# Patient Record
Sex: Female | Born: 1940 | Race: White | Hispanic: No | Marital: Married | Smoking: Never smoker
Health system: Southern US, Community
[De-identification: ages and names within clinical notes are randomized; demographics above are authoritative.]

## PROBLEM LIST (undated history)

## (undated) DIAGNOSIS — C801 Malignant (primary) neoplasm, unspecified: Secondary | ICD-10-CM

## (undated) DIAGNOSIS — E785 Hyperlipidemia, unspecified: Secondary | ICD-10-CM

## (undated) DIAGNOSIS — I10 Essential (primary) hypertension: Secondary | ICD-10-CM

## (undated) DIAGNOSIS — Z923 Personal history of irradiation: Secondary | ICD-10-CM

## (undated) DIAGNOSIS — M1990 Unspecified osteoarthritis, unspecified site: Secondary | ICD-10-CM

## (undated) DIAGNOSIS — T7840XA Allergy, unspecified, initial encounter: Secondary | ICD-10-CM

## (undated) DIAGNOSIS — C50919 Malignant neoplasm of unspecified site of unspecified female breast: Secondary | ICD-10-CM

## (undated) HISTORY — DX: Allergy, unspecified, initial encounter: T78.40XA

## (undated) HISTORY — PX: BREAST LUMPECTOMY: SHX2

## (undated) HISTORY — DX: Hyperlipidemia, unspecified: E78.5

## (undated) HISTORY — DX: Essential (primary) hypertension: I10

## (undated) HISTORY — PX: MYOMECTOMY: SHX85

## (undated) HISTORY — PX: ELBOW SURGERY: SHX618

## (undated) HISTORY — PX: CATARACT EXTRACTION: SUR2

## (undated) HISTORY — PX: JOINT REPLACEMENT: SHX530

## (undated) HISTORY — DX: Malignant (primary) neoplasm, unspecified: C80.1

## (undated) HISTORY — PX: BREAST DUCTAL SYSTEM EXCISION: SHX5242

## (undated) HISTORY — PX: COLONOSCOPY: SHX174

## (undated) HISTORY — DX: Unspecified osteoarthritis, unspecified site: M19.90

## (undated) HISTORY — PX: KNEE ARTHROPLASTY: SHX992

---

## 2002-04-12 ENCOUNTER — Other Ambulatory Visit: Admission: RE | Admit: 2002-04-12 | Discharge: 2002-04-12 | Payer: Self-pay | Admitting: Obstetrics & Gynecology

## 2002-08-08 ENCOUNTER — Ambulatory Visit (HOSPITAL_COMMUNITY): Admission: RE | Admit: 2002-08-08 | Discharge: 2002-08-08 | Payer: Self-pay | Admitting: Family Medicine

## 2002-08-08 ENCOUNTER — Encounter (INDEPENDENT_AMBULATORY_CARE_PROVIDER_SITE_OTHER): Payer: Self-pay | Admitting: Specialist

## 2003-04-18 ENCOUNTER — Other Ambulatory Visit: Admission: RE | Admit: 2003-04-18 | Discharge: 2003-04-18 | Payer: Self-pay | Admitting: Obstetrics & Gynecology

## 2003-12-30 ENCOUNTER — Encounter: Admission: RE | Admit: 2003-12-30 | Discharge: 2003-12-30 | Payer: Self-pay | Admitting: Internal Medicine

## 2004-01-15 ENCOUNTER — Encounter: Admission: RE | Admit: 2004-01-15 | Discharge: 2004-02-24 | Payer: Self-pay | Admitting: Internal Medicine

## 2004-04-27 ENCOUNTER — Ambulatory Visit: Payer: Self-pay | Admitting: Internal Medicine

## 2004-05-13 ENCOUNTER — Ambulatory Visit: Payer: Self-pay | Admitting: Internal Medicine

## 2004-05-26 ENCOUNTER — Other Ambulatory Visit: Admission: RE | Admit: 2004-05-26 | Discharge: 2004-05-26 | Payer: Self-pay | Admitting: Obstetrics & Gynecology

## 2008-09-11 ENCOUNTER — Encounter: Admission: RE | Admit: 2008-09-11 | Discharge: 2008-09-11 | Payer: Self-pay | Admitting: Internal Medicine

## 2008-10-03 ENCOUNTER — Ambulatory Visit: Payer: Self-pay | Admitting: Internal Medicine

## 2008-11-14 ENCOUNTER — Encounter: Payer: Self-pay | Admitting: Internal Medicine

## 2008-11-14 ENCOUNTER — Ambulatory Visit: Payer: Self-pay | Admitting: Internal Medicine

## 2008-11-17 ENCOUNTER — Encounter: Payer: Self-pay | Admitting: Internal Medicine

## 2009-01-10 DIAGNOSIS — C50919 Malignant neoplasm of unspecified site of unspecified female breast: Secondary | ICD-10-CM

## 2009-01-10 DIAGNOSIS — Z923 Personal history of irradiation: Secondary | ICD-10-CM

## 2009-01-10 HISTORY — DX: Personal history of irradiation: Z92.3

## 2009-01-10 HISTORY — DX: Malignant neoplasm of unspecified site of unspecified female breast: C50.919

## 2009-07-30 ENCOUNTER — Encounter: Admission: RE | Admit: 2009-07-30 | Discharge: 2009-07-30 | Payer: Self-pay | Admitting: Obstetrics & Gynecology

## 2009-07-31 ENCOUNTER — Encounter: Admission: RE | Admit: 2009-07-31 | Discharge: 2009-07-31 | Payer: Self-pay | Admitting: Obstetrics & Gynecology

## 2009-08-05 ENCOUNTER — Encounter: Admission: RE | Admit: 2009-08-05 | Discharge: 2009-08-05 | Payer: Self-pay | Admitting: Obstetrics & Gynecology

## 2009-08-10 HISTORY — PX: BREAST SURGERY: SHX581

## 2009-08-13 ENCOUNTER — Encounter: Admission: RE | Admit: 2009-08-13 | Discharge: 2009-08-13 | Payer: Self-pay | Admitting: General Surgery

## 2009-08-18 ENCOUNTER — Ambulatory Visit (HOSPITAL_BASED_OUTPATIENT_CLINIC_OR_DEPARTMENT_OTHER): Admission: RE | Admit: 2009-08-18 | Discharge: 2009-08-18 | Payer: Self-pay | Admitting: General Surgery

## 2009-09-04 ENCOUNTER — Ambulatory Visit: Payer: Self-pay | Admitting: Oncology

## 2009-09-09 ENCOUNTER — Ambulatory Visit: Admission: RE | Admit: 2009-09-09 | Discharge: 2009-11-19 | Payer: Self-pay | Admitting: Radiation Oncology

## 2009-09-15 ENCOUNTER — Encounter: Admission: RE | Admit: 2009-09-15 | Discharge: 2009-09-15 | Payer: Self-pay | Admitting: General Surgery

## 2009-11-02 ENCOUNTER — Ambulatory Visit: Payer: Self-pay | Admitting: Oncology

## 2009-12-28 ENCOUNTER — Ambulatory Visit: Payer: Self-pay | Admitting: Oncology

## 2009-12-29 LAB — CBC WITH DIFFERENTIAL/PLATELET
BASO%: 0.2 % (ref 0.0–2.0)
Basophils Absolute: 0 10*3/uL (ref 0.0–0.1)
EOS%: 6.1 % (ref 0.0–7.0)
Eosinophils Absolute: 0.4 10*3/uL (ref 0.0–0.5)
HCT: 39.5 % (ref 34.8–46.6)
HGB: 13.4 g/dL (ref 11.6–15.9)
LYMPH%: 22.3 % (ref 14.0–49.7)
MCH: 29 pg (ref 25.1–34.0)
MCHC: 33.9 g/dL (ref 31.5–36.0)
MCV: 85.6 fL (ref 79.5–101.0)
MONO#: 0.6 10*3/uL (ref 0.1–0.9)
MONO%: 8.6 % (ref 0.0–14.0)
NEUT#: 4 10*3/uL (ref 1.5–6.5)
NEUT%: 62.8 % (ref 38.4–76.8)
Platelets: 304 10*3/uL (ref 145–400)
RBC: 4.62 10*6/uL (ref 3.70–5.45)
RDW: 15 % — ABNORMAL HIGH (ref 11.2–14.5)
WBC: 6.4 10*3/uL (ref 3.9–10.3)
lymph#: 1.4 10*3/uL (ref 0.9–3.3)

## 2009-12-30 LAB — COMPREHENSIVE METABOLIC PANEL
ALT: 21 U/L (ref 0–35)
AST: 18 U/L (ref 0–37)
Albumin: 4 g/dL (ref 3.5–5.2)
Alkaline Phosphatase: 80 U/L (ref 39–117)
BUN: 16 mg/dL (ref 6–23)
CO2: 26 mEq/L (ref 19–32)
Calcium: 9.2 mg/dL (ref 8.4–10.5)
Chloride: 102 mEq/L (ref 96–112)
Creatinine, Ser: 0.78 mg/dL (ref 0.40–1.20)
Glucose, Bld: 99 mg/dL (ref 70–99)
Potassium: 3.9 mEq/L (ref 3.5–5.3)
Sodium: 144 mEq/L (ref 135–145)
Total Bilirubin: 0.8 mg/dL (ref 0.3–1.2)
Total Protein: 6.5 g/dL (ref 6.0–8.3)

## 2009-12-30 LAB — VITAMIN D 25 HYDROXY (VIT D DEFICIENCY, FRACTURES): Vit D, 25-Hydroxy: 40 ng/mL (ref 30–89)

## 2010-02-09 NOTE — Miscellaneous (Signed)
Summary: [facility]  Clinical Lists Changes  Medications: Added new medication of MOVIPREP 100 GM  SOLR (PEG-KCL-NACL-NASULF-NA ASC-C) As per prep instructions. - Signed Rx of MOVIPREP 100 GM  SOLR (PEG-KCL-NACL-NASULF-NA ASC-C) As per prep instructions.;  #1 x 0;  Signed;  Entered by: Ezra Sites RN;  Authorized by: Hilarie Fredrickson MD;  Method used: Electronically to [facility] [phone]*, [address], [place], [place]  [address], Ph: [phone] or [phone], Fax: [phone] Allergies: Added new allergy or adverse reaction of PCN Observations: Added new observation of NKA: F (10/03/2008 8:53)    Prescriptions: MOVIPREP 100 GM  SOLR (PEG-KCL-NACL-NASULF-NA ASC-C) As per prep instructions.  #1 x 0   Entered by:   Ezra Sites RN   Authorized by:   Hilarie Fredrickson MD   Signed by:   Ezra Sites RN on 10/03/2008   Method used:   Electronically to        [facility] [phone]* (retail)       [address]       [place], [place]  [address]       Ph: [phone] or [phone]       Fax: [phone]   RxID:   [id number]

## 2010-02-09 NOTE — Letter (Signed)
Summary: Patient Notice- Polyp Results  [facility]  [address] [place], [place] [address]   Phone: [phone]  Fax: [phone]        November 17, 2008 MRN: [id number]    [facility] [place] [address] [place], [place]  [address]    Dear Ms. Ewer,  I am pleased to inform you that the colon polyp(s) removed during your recent colonoscopy was (were) found to be benign (no cancer detected) upon pathologic examination. Also, the small ulcer in the colon revealed only nonspecific inflammation (nothing further needs to be done).  I recommend you have a repeat colonoscopy examination in 5 years to look for recurrent polyps, as having colon polyps increases your risk for having recurrent polyps or even colon cancer in the future.  Should you develop new or worsening symptoms of abdominal pain, bowel habit changes or bleeding from the rectum or bowels, please schedule an evaluation with either your primary care physician or with me.  Additional information/recommendations:  __ No further action with gastroenterology is needed at this time. Please      follow-up with your primary care physician for your other healthcare      needs.   Please call us if you are having persistent problems or have questions about your condition that have not been fully answered at this time.  Sincerely,  Hilarie Fredrickson MD  This letter has been electronically signed by your physician.  Appended Document: Patient Notice- Polyp Results Letter mailed 11.09.10

## 2010-02-09 NOTE — Procedures (Signed)
Summary: Colonoscopy  Patient: Eugene Zeiders Note: All result statuses are Final unless otherwise noted.  Tests: (1) Colonoscopy (COL)   COL Colonoscopy           DONE     [facility]     [address]     [place], [place]  [address]           COLONOSCOPY PROCEDURE REPORT           PATIENT:  Yolanda Peters, Yolanda Peters  MR#:  [id number]     BIRTHDATE:  [birth year], 68 yrs. old  GENDER:  female           ENDOSCOPIST:  Wilhemina Bonito. Eda Keys, MD     Referred by:  Surveillance Program Recall,           PROCEDURE DATE:  11/14/2008     PROCEDURE:  Colonoscopy with biopsy,     Colonoscopy with snare polypectomy     ASA CLASS:  Class II     INDICATIONS:  history of pre-cancerous (adenomatous) colon polyps     (index 05-2004 w/ small adenomas); Two cousins w/ colon Ca           MEDICATIONS:   Fentanyl 100 mcg IV, Versed 10 mg IV           DESCRIPTION OF PROCEDURE:   After the risks benefits and     alternatives of the procedure were thoroughly explained, informed     consent was obtained.  Digital rectal exam was performed and     revealed no abnormalities.   The LB CF-[id number] endoscope     was introduced through the anus and advanced to the cecum, which     was identified by both the appendix and ileocecal valve, without     limitations.Time to cecum = 4:50 min. The quality of the prep was     excellent, using MoviPrep.  The instrument was then withdrawn     (time = 13:02 min) as the colon was fully examined.     <<PROCEDUREIMAGES>>           FINDINGS:  A diminutive polyp was found in the proximal transverse     colon. Polyp was snared without cautery. Retrieval was successful.     An ulcer was found in the ascending colon.  Moderate     diverticulosis was found in the left colon.   Retroflexed views in     the rectum revealed internal hemorrhoids.    The scope was then     withdrawn from the patient and the procedure completed.           COMPLICATIONS:  None           ENDOSCOPIC  IMPRESSION:     1) Diminutive polyp in the proximal transverse colon - removed     2) Small nonspecific focal Ulcer in the ascending colon -bx     3) Moderate diverticulosis in the left colon     4) Internal hemorrhoids           RECOMMENDATIONS:     1) Follow up colonoscopy in 5 years           ______________________________     Wilhemina Bonito. Eda Keys, MD           CC:  Creola Corn, MD;  The Patient           n.     eSIGNED:   Wilhemina Bonito. Eda Keys  at 11/14/2008 09:07 AM           [place], Jency, [id number]  Note: An exclamation mark (!) indicates a result that was not dispersed into the flowsheet. Document Creation Date: 11/14/2008 9:07 AM _______________________________________________________________________  (1) Order result status: Final Collection or observation date-time: 11/14/2008 08:58 Requested date-time:  Receipt date-time:  Reported date-time:  Referring Physician:   Ordering Physician: Fransico Setters [phone]) Specimen Source:  Source: Launa Grill Order Number: [id number] Lab site:   Appended Document: Colonoscopy     Procedures Next Due Date:    Colonoscopy: 11/2013

## 2010-02-24 ENCOUNTER — Other Ambulatory Visit: Payer: Self-pay | Admitting: Oncology

## 2010-02-24 DIAGNOSIS — Z923 Personal history of irradiation: Secondary | ICD-10-CM

## 2010-03-17 ENCOUNTER — Ambulatory Visit: Payer: BC Managed Care – PPO | Attending: Radiation Oncology | Admitting: Radiation Oncology

## 2010-03-26 LAB — CBC
HCT: 44.4 % (ref 36.0–46.0)
Hemoglobin: 15.3 g/dL — ABNORMAL HIGH (ref 12.0–15.0)
MCH: 29 pg (ref 26.0–34.0)
MCHC: 34.5 g/dL (ref 30.0–36.0)
MCV: 84.3 fL (ref 78.0–100.0)
Platelets: 222 10*3/uL (ref 150–400)
RBC: 5.27 MIL/uL — ABNORMAL HIGH (ref 3.87–5.11)
RDW: 14.1 % (ref 11.5–15.5)
WBC: 7.1 10*3/uL (ref 4.0–10.5)

## 2010-03-26 LAB — BASIC METABOLIC PANEL
BUN: 9 mg/dL (ref 6–23)
CO2: 26 mEq/L (ref 19–32)
Calcium: 8.9 mg/dL (ref 8.4–10.5)
Chloride: 104 mEq/L (ref 96–112)
Creatinine, Ser: 0.67 mg/dL (ref 0.4–1.2)
GFR calc Af Amer: >60 mL/min (ref >60)
GFR calc non Af Amer: >60 mL/min (ref >60)
Glucose, Bld: 119 mg/dL — ABNORMAL HIGH (ref 70–99)
Potassium: 3.5 mEq/L (ref 3.5–5.1)
Sodium: 139 mEq/L (ref 135–145)

## 2010-03-26 LAB — DIFFERENTIAL
Basophils Absolute: 0 10*3/uL (ref 0.0–0.1)
Basophils Relative: 1 % (ref 0–1)
Eosinophils Absolute: 0.2 10*3/uL (ref 0.0–0.7)
Eosinophils Relative: 3 % (ref 0–5)
Lymphocytes Relative: 28 % (ref 12–46)
Lymphs Abs: 2 10*3/uL (ref 0.7–4.0)
Monocytes Absolute: 0.5 10*3/uL (ref 0.1–1.0)
Monocytes Relative: 7 % (ref 3–12)
Neutro Abs: 4.4 10*3/uL (ref 1.7–7.7)
Neutrophils Relative %: 62 % (ref 43–77)

## 2010-03-26 LAB — CANCER ANTIGEN 27.29: CA 27.29: 4 U/mL (ref 0–39)

## 2010-05-28 NOTE — Op Note (Signed)
   NAME:  Yolanda Peters, Yolanda Peters                          ACCOUNT NO.:  [id number]   MEDICAL RECORD NO.:  [id number]                   PATIENT TYPE:  AMB   LOCATION:  SDC                                  [facility]ILITY:  [facility]   PHYSICIAN:  Freddy Finner, M.D.                DATE OF BIRTH:  Aug 19, 1940   DATE OF PROCEDURE:  08/08/2002  DATE OF DISCHARGE:  08/08/2002                                 OPERATIVE REPORT   PREOPERATIVE DIAGNOSES:  1. Postmenopausal bleeding.  2. Fibroids.  3. Endometrial polyp by sonohysterogram.  4. Hormone replacement therapy.   POSTOPERATIVE DIAGNOSES:  1. Postmenopausal bleeding.  2. Fibroids.  3. Endometrial polyp by sonohysterogram.  4. Hormone replacement therapy.   OPERATIVE PROCEDURE:  Hysteroscopy D&C with resection of small endometrial  polyp.   SURGEON:  Freddy Finner, M.D.   ANESTHESIA:  General.   INTRAOPERATIVE COMPLICATIONS:  None.   ESTIMATED INTRAOPERATIVE BLOOD LOSS:  Less than 10 mL.   SORBITOL DEFICIT INTRAOPERATIVELY:  Less than or equal to 40 mL.   INTRAOPERATIVE COMPLICATIONS:  None.   DESCRIPTION OF PROCEDURE:  Details of the present illness are recorded in  the nursing note.  The patient was admitted on the morning of surgery.  She  was brought to the operating room, placed under adequate intravenous  sedation with light general anesthesia.  Paracervical block was placed using  10 mL of 1% plain Xylocaine.  Cervix was sounded to 10 cm.  Progressive  dilatation of the cervix was carried out with Pratts to 23.  The 12.5 degree  ACMI hysteroscope was introduced.  Sorbitol 3% was used as a distending  medium.  The endometrial cavity was surveyed and photographed.  What  appeared to be a small polyp posteriorly in the uterine cavity was  thoroughly curettage and exploration with polyp forceps was performed to  recover tissue.  Reinspection with the hysteroscope revealed adequate  sampling and removal of the polyp.  The procedure at this  point was  terminated, the instruments were removed.  The patient was awakened and  taken to recovery in good condition.                                              Freddy Finner, M.D.   WRN/MEDQ  D:  08/20/2002  T:  08/20/2002  Job:  [id number]

## 2010-05-28 NOTE — H&P (Signed)
NAME:  Yolanda Peters, Yolanda Peters                          ACCOUNT NO.:  [id number]   MEDICAL RECORD NO.:  [id number]                   PATIENT TYPE:  AMB   LOCATION:  SDC                                  [facility]   PHYSICIAN:  Freddy Finner, M.D.                DATE OF BIRTH:  1940-04-21   DATE OF ADMISSION:  08/08/2002  DATE OF DISCHARGE:                                HISTORY & PHYSICAL   ADMISSION DIAGNOSES:  1. Uterine leiomyomata.  2. Endometrial polyp.   HISTORY OF PRESENT ILLNESS:  The patient is a 70 year old, white, married  female, gravida 1, para 1, who has been followed in my office since  September of 2003.  At that time, her pelvic examination was remarkable for  slight enlargement of the uterus to approximately six weeks size, which was  irregularly nodular.  She was seen again in April of 2004 for annual  examination and findings were essentially unchanged.  She presented in June  of this year complaining of light menses of approximately 11 days' duration.  At that time, she was on Prempro 2.5 mg.  A sonohysterogram was performed  which showed a 1.6 x 0.9 x 1.2 cm polypoid mass within the endometrial  cavity.  There were also at least four myomas, the largest measuring 4.1 x  3.3 cm.  The patient is now admitted for hysteroscopy and D&C.   REVIEW OF SYSTEMS:  Otherwise negative.   PAST MEDICAL HISTORY:  The patient is known to have hypertension for which  she takes verapamil.  She has no other known significant medical conditions.  She does take Prempro 2.5 mg for hormone replacement therapy.  She has never  had a blood transfusion.   ALLERGIES:  The patient has no known allergies to medications.   MEDICATIONS:  She is currently on no other medications other than those  noted above.   SOCIAL HISTORY:  She does not use cigarettes.   FAMILY HISTORY:  Remarkable for a sister who had what sounds like a  mesothelioma involving the peritoneal cavity.  There is no  other significant  contributing family history.   PHYSICAL EXAMINATION:  HEENT:  Grossly within normal limits.  VITAL SIGNS:  The blood pressure in the office is 126/80.  NECK:  The thyroid gland is not palpably enlarged.  CHEST:  Clear to auscultation.  HEART:  Normal sinus rhythm without murmurs, rubs, or gallops.  BREASTS:  Exam is considered to be normal.  No palpable masses.  No nipple  discharge.  No skin change.  ABDOMEN:  Soft and nontender without appreciable organomegaly or palpable  masses.  PELVIC:  External genitalia, vagina, and cervix are normal to inspection.  Bimanual reveals the uterus to be irregularly enlarged.  There are no  palpable adnexal masses.  The rectovaginal exam confirms these findings.   ASSESSMENT:  1. Uterine leiomyomata.  2. Endometrial  polyp.  3. Postmenopausal bleeding on hormone replacement therapy.   PLAN:  Hysteroscopy, D&C, and resection of endometrial polyp.                                               Freddy Finner, M.D.    WRN/MEDQ  D:  08/05/2002  T:  08/05/2002  Job:  [id number]

## 2010-06-14 ENCOUNTER — Ambulatory Visit
Admission: RE | Admit: 2010-06-14 | Discharge: 2010-06-14 | Disposition: A | Discharge status: Home / Self Care | Payer: 59 | Source: Ambulatory Visit | Attending: Oncology | Admitting: Oncology

## 2010-06-14 DIAGNOSIS — Z923 Personal history of irradiation: Secondary | ICD-10-CM

## 2010-06-21 ENCOUNTER — Other Ambulatory Visit: Payer: Self-pay | Admitting: Obstetrics & Gynecology

## 2010-06-21 DIAGNOSIS — Z853 Personal history of malignant neoplasm of breast: Secondary | ICD-10-CM

## 2010-06-30 ENCOUNTER — Encounter (HOSPITAL_BASED_OUTPATIENT_CLINIC_OR_DEPARTMENT_OTHER): Payer: 59 | Admitting: Oncology

## 2010-06-30 ENCOUNTER — Other Ambulatory Visit: Payer: Self-pay | Admitting: Oncology

## 2010-06-30 DIAGNOSIS — C50419 Malignant neoplasm of upper-outer quadrant of unspecified female breast: Secondary | ICD-10-CM

## 2010-06-30 LAB — CBC WITH DIFFERENTIAL/PLATELET
BASO%: 0.4 % (ref 0.0–2.0)
Basophils Absolute: 0 10*3/uL (ref 0.0–0.1)
EOS%: 3.2 % (ref 0.0–7.0)
Eosinophils Absolute: 0.2 10*3/uL (ref 0.0–0.5)
HCT: 41.5 % (ref 34.8–46.6)
HGB: 14.1 g/dL (ref 11.6–15.9)
LYMPH%: 25 % (ref 14.0–49.7)
MCH: 28.9 pg (ref 25.1–34.0)
MCHC: 34 g/dL (ref 31.5–36.0)
MCV: 84.9 fL (ref 79.5–101.0)
MONO#: 0.5 10*3/uL (ref 0.1–0.9)
MONO%: 9.8 % (ref 0.0–14.0)
NEUT#: 3 10*3/uL (ref 1.5–6.5)
NEUT%: 61.6 % (ref 38.4–76.8)
Platelets: 262 10*3/uL (ref 145–400)
RBC: 4.89 10*6/uL (ref 3.70–5.45)
RDW: 15.8 % — ABNORMAL HIGH (ref 11.2–14.5)
WBC: 4.8 10*3/uL (ref 3.9–10.3)
lymph#: 1.2 10*3/uL (ref 0.9–3.3)

## 2010-06-30 LAB — COMPREHENSIVE METABOLIC PANEL
ALT: 17 U/L (ref 0–35)
AST: 16 U/L (ref 0–37)
Albumin: 4.4 g/dL (ref 3.5–5.2)
Alkaline Phosphatase: 79 U/L (ref 39–117)
BUN: 13 mg/dL (ref 6–23)
CO2: 29 mEq/L (ref 19–32)
Calcium: 9.2 mg/dL (ref 8.4–10.5)
Chloride: 102 mEq/L (ref 96–112)
Creatinine, Ser: 0.71 mg/dL (ref 0.50–1.10)
Glucose, Bld: 87 mg/dL (ref 70–99)
Potassium: 3.7 mEq/L (ref 3.5–5.3)
Sodium: 139 mEq/L (ref 135–145)
Total Bilirubin: 0.7 mg/dL (ref 0.3–1.2)
Total Protein: 6.8 g/dL (ref 6.0–8.3)

## 2010-06-30 LAB — VITAMIN D 25 HYDROXY (VIT D DEFICIENCY, FRACTURES): Vit D, 25-Hydroxy: 59 ng/mL (ref 30–89)

## 2010-07-05 ENCOUNTER — Encounter (HOSPITAL_BASED_OUTPATIENT_CLINIC_OR_DEPARTMENT_OTHER): Payer: Medicare Other | Admitting: Oncology

## 2010-07-05 ENCOUNTER — Encounter (INDEPENDENT_AMBULATORY_CARE_PROVIDER_SITE_OTHER): Payer: Self-pay | Admitting: General Surgery

## 2010-07-05 ENCOUNTER — Ambulatory Visit (INDEPENDENT_AMBULATORY_CARE_PROVIDER_SITE_OTHER): Payer: 59 | Admitting: General Surgery

## 2010-07-05 VITALS — BP 136/88 | HR 70 | Temp 98.2°F | Resp 16 | Ht 66.0 in | Wt 165.2 lb

## 2010-07-05 DIAGNOSIS — C50419 Malignant neoplasm of upper-outer quadrant of unspecified female breast: Secondary | ICD-10-CM

## 2010-07-05 DIAGNOSIS — C50919 Malignant neoplasm of unspecified site of unspecified female breast: Secondary | ICD-10-CM | POA: Insufficient documentation

## 2010-07-05 NOTE — Progress Notes (Signed)
Subjective:     Patient ID: Yolanda Peters, female   DOB: 05-14-1940, 70 y.o.   MRN: [id number]    BP 136/88  Pulse 70  Temp(Src) 98.2 F (36.8 C) (Temporal)  Resp 16  Ht 5\' 6"  (1.676 m)  Wt 165 lb 3.2 oz (74.934 kg)  BMI 26.66 kg/m2    HPI She saw Dr. Donnie Coffin today for followup of her right breast cancer.  He was concerned she might have a wound infection.  She does not feel any different.  No fever or chills.  No change in her breast over the past 6 weeks.  Review of Systems     Objective:   Physical Exam Breast:  Right breast has an upper outer quadrant incision with firmness deep to it that is unchanged from her May 2012 visit; dull redness is present with radiation-type skin changes; no fluctuance or tenderness-unchanged from her last visit.  Left breast is soft w/out mass or suspicious skin change.  Nodes:  No palpable supraclavicular or axillary adenopathy.    Assessment:     Right breast cancer with postoperative and postradiation changes.  No evidence of an infection.    Plan:    Followup with me in 3 months.

## 2010-07-06 ENCOUNTER — Encounter (INDEPENDENT_AMBULATORY_CARE_PROVIDER_SITE_OTHER): Payer: Self-pay | Admitting: Surgery

## 2010-09-20 ENCOUNTER — Ambulatory Visit
Admission: RE | Admit: 2010-09-20 | Discharge: 2010-09-20 | Disposition: A | Discharge status: Home / Self Care | Payer: Medicare Other | Source: Ambulatory Visit | Attending: Obstetrics & Gynecology | Admitting: Obstetrics & Gynecology

## 2010-09-20 DIAGNOSIS — Z853 Personal history of malignant neoplasm of breast: Secondary | ICD-10-CM

## 2010-10-08 ENCOUNTER — Other Ambulatory Visit: Payer: Self-pay | Admitting: Dermatology

## 2010-10-20 ENCOUNTER — Ambulatory Visit (INDEPENDENT_AMBULATORY_CARE_PROVIDER_SITE_OTHER): Payer: PRIVATE HEALTH INSURANCE | Admitting: General Surgery

## 2010-10-20 ENCOUNTER — Encounter (INDEPENDENT_AMBULATORY_CARE_PROVIDER_SITE_OTHER): Payer: Self-pay | Admitting: General Surgery

## 2010-10-20 VITALS — BP 136/84 | HR 60 | Temp 97.0°F | Resp 20 | Ht 65.5 in | Wt 163.5 lb

## 2010-10-20 DIAGNOSIS — C50911 Malignant neoplasm of unspecified site of right female breast: Secondary | ICD-10-CM

## 2010-10-20 DIAGNOSIS — C50919 Malignant neoplasm of unspecified site of unspecified female breast: Secondary | ICD-10-CM

## 2010-10-20 NOTE — Progress Notes (Signed)
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  She still has discoloration of the right breast. No pain or limitation of range of motion. She denies any new masses in the right breast and denies any masses in the left breast. No adenopathy. Mammogram was done in September.  This is a BI-RADS 2.  PE: Gen.-well-developed, well-nourished, in no acute distress.  Right breast-radiation skin changes noted. Upper-outer scar with firmness and slight purple discoloration which is unchanged. No masses palpated.  Left breast-no palpable masses, suspicious skin changes, or nipple discharge.  Lymph nodes-no palpable supraclavicular, axillary, or cervical adenopathy.  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 No radiographic evidence of recurrence. I told her the discoloration may eventually get better or be a long-term issue.  Plan:  Return visit in 3-4 months.

## 2010-12-30 ENCOUNTER — Other Ambulatory Visit (HOSPITAL_BASED_OUTPATIENT_CLINIC_OR_DEPARTMENT_OTHER): Payer: BLUE CROSS/BLUE SHIELD | Admitting: Lab

## 2010-12-30 ENCOUNTER — Other Ambulatory Visit: Payer: Self-pay | Admitting: Oncology

## 2010-12-30 DIAGNOSIS — C50419 Malignant neoplasm of upper-outer quadrant of unspecified female breast: Secondary | ICD-10-CM

## 2010-12-30 LAB — CBC WITH DIFFERENTIAL/PLATELET
BASO%: 0.2 % (ref 0.0–2.0)
Basophils Absolute: 0 10*3/uL (ref 0.0–0.1)
EOS%: 4.5 % (ref 0.0–7.0)
Eosinophils Absolute: 0.4 10*3/uL (ref 0.0–0.5)
HCT: 45.3 % (ref 34.8–46.6)
HGB: 14.8 g/dL (ref 11.6–15.9)
LYMPH%: 20.9 % (ref 14.0–49.7)
MCH: 27.9 pg (ref 25.1–34.0)
MCHC: 32.6 g/dL (ref 31.5–36.0)
MCV: 85.7 fL (ref 79.5–101.0)
MONO#: 0.5 10*3/uL (ref 0.1–0.9)
MONO%: 6.2 % (ref 0.0–14.0)
NEUT#: 5.4 10*3/uL (ref 1.5–6.5)
NEUT%: 68.2 % (ref 38.4–76.8)
Platelets: 283 10*3/uL (ref 145–400)
RBC: 5.29 10*6/uL (ref 3.70–5.45)
RDW: 15.3 % — ABNORMAL HIGH (ref 11.2–14.5)
WBC: 8 10*3/uL (ref 3.9–10.3)
lymph#: 1.7 10*3/uL (ref 0.9–3.3)

## 2010-12-31 LAB — COMPREHENSIVE METABOLIC PANEL
ALT: 26 U/L (ref 0–35)
AST: 21 U/L (ref 0–37)
Albumin: 4.6 g/dL (ref 3.5–5.2)
Alkaline Phosphatase: 86 U/L (ref 39–117)
BUN: 18 mg/dL (ref 6–23)
CO2: 24 mEq/L (ref 19–32)
Calcium: 9.6 mg/dL (ref 8.4–10.5)
Chloride: 100 mEq/L (ref 96–112)
Creatinine, Ser: 0.79 mg/dL (ref 0.50–1.10)
Glucose, Bld: 75 mg/dL (ref 70–99)
Potassium: 3.4 mEq/L — ABNORMAL LOW (ref 3.5–5.3)
Sodium: 139 mEq/L (ref 135–145)
Total Bilirubin: 1 mg/dL (ref 0.3–1.2)
Total Protein: 7.3 g/dL (ref 6.0–8.3)

## 2010-12-31 LAB — CANCER ANTIGEN 27.29: CA 27.29: 20 U/mL (ref 0–39)

## 2010-12-31 LAB — VITAMIN D 25 HYDROXY (VIT D DEFICIENCY, FRACTURES): Vit D, 25-Hydroxy: 50 ng/mL (ref 30–89)

## 2011-01-06 ENCOUNTER — Ambulatory Visit (HOSPITAL_BASED_OUTPATIENT_CLINIC_OR_DEPARTMENT_OTHER): Payer: Medicare Other | Admitting: Oncology

## 2011-01-06 VITALS — BP 153/88 | HR 99 | Temp 97.6°F | Ht 65.5 in | Wt 166.8 lb

## 2011-01-06 DIAGNOSIS — E559 Vitamin D deficiency, unspecified: Secondary | ICD-10-CM

## 2011-01-06 DIAGNOSIS — Z79810 Long term (current) use of selective estrogen receptor modulators (SERMs): Secondary | ICD-10-CM

## 2011-01-06 DIAGNOSIS — Z853 Personal history of malignant neoplasm of breast: Secondary | ICD-10-CM

## 2011-01-06 DIAGNOSIS — Z923 Personal history of irradiation: Secondary | ICD-10-CM

## 2011-01-06 DIAGNOSIS — C50919 Malignant neoplasm of unspecified site of unspecified female breast: Secondary | ICD-10-CM

## 2011-01-06 NOTE — Progress Notes (Signed)
Hematology and Oncology Follow Up Visit  Yolanda Peters [id number] 1940-07-24 70 y.o. 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  Medications: I have reviewed the patient's current medications.  Allergies:  Allergies  Allergen Reactions  . Penicillins     REACTION: Nausea, swelling    Past Medical History, Surgical history, Social history, and Family History were reviewed and updated.  Review of Systems: Constitutional:  Negative for fever, chills, night sweats, anorexia, weight loss, pain. Cardiovascular: negative Respiratory: negative Neurological: negative Dermatological: negative ENT: negative Skin Gastrointestinal: no abdominal pain, change in bowel habits, or black or bloody stools Genito-Urinary: negative Hematological and Lymphatic: negative Breast: negative for breast lumps Musculoskeletal: negative Remaining ROS negative. Hot flashes totally resolved with peridin c  Physical Exam: Blood pressure 153/88, pulse 99, temperature 97.6 F (36.4 C), height 5' 5.5" (1.664 m), weight 166 lb 12.8 oz (75.66 kg). ECOG: 0 General appearance: alert, cooperative and appears stated age Head: Normocephalic, without obvious abnormality, atraumatic Neck: no adenopathy, no carotid bruit, no JVD, supple, symmetrical, trachea midline and thyroid not enlarged, symmetric, no tenderness/mass/nodules Lymph nodes: Cervical, supraclavicular, and axillary nodes normal. Cardiac : regular rate and rhythm, no murmurs or gallops Pulmonary:clear to auscultation bilaterally and normal percussion bilaterally Breasts: inspection negative, no nipple discharge or bleeding, no masses or nodularity palpable, rt breast , reddish blush  With fluctuant area , lateral portion of rt  breast Abdomen:soft, non-tender; bowel sounds normal; no masses,  no organomegaly Extremities negative Neuro: alert, oriented, normal speech, no focal findings or movement disorder noted  Lab Results: Lab Results  Component Value Date   WBC 8.0 12/30/2010   HGB 14.8 12/30/2010   HCT 45.3 12/30/2010   MCV 85.7 12/30/2010   PLT 283 12/30/2010     Chemistry      Component Value Date/Time   NA 139 12/30/2010 1338   NA 139 12/30/2010 1338   NA 139 12/30/2010 1338   K 3.4* 12/30/2010 1338   K 3.4* 12/30/2010 1338   K 3.4* 12/30/2010 1338   CL 100 12/30/2010 1338   CL 100 12/30/2010 1338   CL 100 12/30/2010 1338   CO2 24 12/30/2010 1338   CO2 24 12/30/2010 1338   CO2 24 12/30/2010 1338   BUN 18 12/30/2010 1338   BUN 18 12/30/2010 1338   BUN 18 12/30/2010 1338   CREATININE 0.79 12/30/2010 1338   CREATININE 0.79 12/30/2010 1338   CREATININE 0.79 12/30/2010 1338      Component Value Date/Time   CALCIUM 9.6 12/30/2010 1338   CALCIUM 9.6 12/30/2010 1338   CALCIUM 9.6 12/30/2010 1338   ALKPHOS 86 12/30/2010 1338   ALKPHOS 86 12/30/2010 1338   ALKPHOS 86 12/30/2010 1338   AST 21 12/30/2010 1338   AST 21 12/30/2010 1338   AST 21 12/30/2010 1338   ALT 26 12/30/2010 1338   ALT 26 12/30/2010 1338   ALT 26 12/30/2010 1338   BILITOT 1.0 12/30/2010 1338   BILITOT 1.0 12/30/2010 1338   BILITOT 1.0 12/30/2010 1338      .pathology. Radiological Studies: chest X-ray n/a Mammogram 9/12-wnl Bone density n/a  Impression and Plan: Post menopausal woman with hx DCIS, s/p lumpectomy , xrt and on tamoxifen; doing well, f/u in 1 yr.  More than 50% of the visit was spent in  patient-related counselling   Brylynn Hanssen, MD 12/27/201210:50 AM

## 2011-01-24 ENCOUNTER — Encounter (INDEPENDENT_AMBULATORY_CARE_PROVIDER_SITE_OTHER): Payer: Self-pay | Admitting: General Surgery

## 2011-01-24 ENCOUNTER — Ambulatory Visit (INDEPENDENT_AMBULATORY_CARE_PROVIDER_SITE_OTHER): Payer: Medicare Other | Admitting: General Surgery

## 2011-01-24 VITALS — BP 124/78 | HR 88 | Temp 97.5°F | Resp 16 | Ht 65.5 in | Wt 163.6 lb

## 2011-01-24 DIAGNOSIS — Z853 Personal history of malignant neoplasm of breast: Secondary | ICD-10-CM

## 2011-01-24 NOTE — Patient Instructions (Signed)
Call if you feel any new masses in your breasts. 

## 2011-01-24 NOTE — Progress Notes (Signed)
Operation:  Right partial mastectomy and right axillary sentinel lymph node biopsy  Date: August 18, 2009  Stage:  High-grade DCIS  Hormone receptor status: Positive  HPI:  Ms. 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  She still has discoloration and firmness at the right breast partial mastectomy site. No pain or limitation of range of motion. She denies any new masses in the right breast and denies any masses in the left breast. No adenopathy.  PE: Gen.-well-developed, well-nourished, in no acute distress.  Right breast-radiation skin changes noted. Upper-outer scar with firmness and slight purple discoloration which is unchanged. No masses palpated.  Left breast-no palpable masses, suspicious skin changes, or nipple discharge.  Lymph nodes-no palpable supraclavicular, axillary, or cervical adenopathy.  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   Plan:  Return visit in 3-4 months.

## 2011-02-15 ENCOUNTER — Other Ambulatory Visit: Payer: Self-pay | Admitting: Oncology

## 2011-02-15 DIAGNOSIS — C50419 Malignant neoplasm of upper-outer quadrant of unspecified female breast: Secondary | ICD-10-CM

## 2011-04-26 ENCOUNTER — Encounter (INDEPENDENT_AMBULATORY_CARE_PROVIDER_SITE_OTHER): Payer: Self-pay | Admitting: General Surgery

## 2011-04-26 ENCOUNTER — Ambulatory Visit (INDEPENDENT_AMBULATORY_CARE_PROVIDER_SITE_OTHER): Payer: Medicare Other | Admitting: General Surgery

## 2011-04-26 VITALS — BP 124/86 | HR 72 | Temp 97.6°F | Resp 18 | Ht 65.5 in | Wt 160.0 lb

## 2011-04-26 DIAGNOSIS — Z853 Personal history of malignant neoplasm of breast: Secondary | ICD-10-CM

## 2011-04-26 NOTE — Patient Instructions (Signed)
Call if you feel any new masses in your breasts. 

## 2011-04-26 NOTE — Progress Notes (Signed)
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  She still has discoloration and firmness at the right breast partial mastectomy site that is less prominent.  She denies any new masses in the right breast and denies any masses in the left breast. No adenopathy.  PE: Gen.-well-developed, well-nourished, in no acute distress.  Right breast-radiation skin changes noted. Upper-outer scar with firmness and slight purple discoloration which is unchanged. No masses palpated.  Left breast-no palpable masses, suspicious skin changes, or nipple discharge.  Lymph nodes-no palpable supraclavicular, axillary, or cervical adenopathy.  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   Plan:  Return visit in 3-4 months.

## 2011-06-13 ENCOUNTER — Encounter (INDEPENDENT_AMBULATORY_CARE_PROVIDER_SITE_OTHER): Payer: Self-pay | Admitting: General Surgery

## 2011-07-25 ENCOUNTER — Encounter (INDEPENDENT_AMBULATORY_CARE_PROVIDER_SITE_OTHER): Payer: Self-pay | Admitting: General Surgery

## 2011-07-25 ENCOUNTER — Ambulatory Visit (INDEPENDENT_AMBULATORY_CARE_PROVIDER_SITE_OTHER): Payer: PRIVATE HEALTH INSURANCE | Admitting: General Surgery

## 2011-07-25 VITALS — BP 118/72 | HR 76 | Temp 97.9°F | Resp 16 | Ht 65.5 in | Wt 155.8 lb

## 2011-07-25 DIAGNOSIS — Z853 Personal history of malignant neoplasm of breast: Secondary | ICD-10-CM

## 2011-07-25 NOTE — Patient Instructions (Signed)
Call if you find any new masses in your breasts. 

## 2011-07-25 NOTE — Progress Notes (Signed)
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  She still has discoloration and firmness at the right breast partial mastectomy site that is unchanged.  She is taking Arimidex.  She denies any new masses in the right breast and denies any masses in the left breast. No adenopathy.  PE: Gen.-well-developed, well-nourished, in no acute distress.  Right breast-radiation skin changes noted. Upper-outer scar with firmness and slight purple discoloration which is unchanged. No masses palpated.  Left breast-no palpable masses, suspicious skin changes, or nipple discharge.  Lymph nodes-no palpable supraclavicular, axillary, or cervical adenopathy.  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   Plan:  Return visit in 6 months.

## 2011-08-18 ENCOUNTER — Other Ambulatory Visit: Payer: Self-pay | Admitting: Obstetrics & Gynecology

## 2011-08-18 DIAGNOSIS — Z1231 Encounter for screening mammogram for malignant neoplasm of breast: Secondary | ICD-10-CM

## 2011-09-22 ENCOUNTER — Ambulatory Visit
Admission: RE | Admit: 2011-09-22 | Discharge: 2011-09-22 | Disposition: A | Discharge status: Home / Self Care | Payer: 59 | Source: Ambulatory Visit | Attending: Obstetrics & Gynecology | Admitting: Obstetrics & Gynecology

## 2011-09-22 ENCOUNTER — Other Ambulatory Visit: Payer: Self-pay | Admitting: Obstetrics & Gynecology

## 2011-09-22 DIAGNOSIS — Z9889 Other specified postprocedural states: Secondary | ICD-10-CM

## 2011-09-22 DIAGNOSIS — Z1231 Encounter for screening mammogram for malignant neoplasm of breast: Secondary | ICD-10-CM

## 2011-12-02 ENCOUNTER — Other Ambulatory Visit: Payer: Self-pay | Admitting: Emergency Medicine

## 2011-12-02 DIAGNOSIS — C50419 Malignant neoplasm of upper-outer quadrant of unspecified female breast: Secondary | ICD-10-CM

## 2011-12-02 MED ORDER — ANASTROZOLE 1 MG PO TABS: 1.0000 mg | ORAL_TABLET | Freq: Every day | ORAL | Status: DC

## 2011-12-19 ENCOUNTER — Other Ambulatory Visit: Payer: Self-pay | Admitting: *Deleted

## 2011-12-30 ENCOUNTER — Other Ambulatory Visit (HOSPITAL_BASED_OUTPATIENT_CLINIC_OR_DEPARTMENT_OTHER): Payer: Medicare Other | Admitting: Lab

## 2011-12-30 DIAGNOSIS — C50919 Malignant neoplasm of unspecified site of unspecified female breast: Secondary | ICD-10-CM

## 2011-12-30 DIAGNOSIS — E559 Vitamin D deficiency, unspecified: Secondary | ICD-10-CM

## 2011-12-30 DIAGNOSIS — C50419 Malignant neoplasm of upper-outer quadrant of unspecified female breast: Secondary | ICD-10-CM

## 2011-12-30 LAB — COMPREHENSIVE METABOLIC PANEL (CC13)
ALT: 29 U/L (ref 0–55)
AST: 23 U/L (ref 5–34)
Albumin: 4.2 g/dL (ref 3.5–5.0)
Alkaline Phosphatase: 101 U/L (ref 40–150)
BUN: 22 mg/dL (ref 7.0–26.0)
CO2: 29 mEq/L (ref 22–29)
Calcium: 9.6 mg/dL (ref 8.4–10.4)
Chloride: 101 mEq/L (ref 98–107)
Creatinine: 0.8 mg/dL (ref 0.6–1.1)
Glucose: 87 mg/dl (ref 70–99)
Potassium: 3.8 mEq/L (ref 3.5–5.1)
Sodium: 140 mEq/L (ref 136–145)
Total Bilirubin: 1.01 mg/dL (ref 0.20–1.20)
Total Protein: 7.1 g/dL (ref 6.4–8.3)

## 2011-12-30 LAB — CBC WITH DIFFERENTIAL/PLATELET
BASO%: 0.8 % (ref 0.0–2.0)
Basophils Absolute: 0 10*3/uL (ref 0.0–0.1)
EOS%: 2.5 % (ref 0.0–7.0)
Eosinophils Absolute: 0.2 10*3/uL (ref 0.0–0.5)
HCT: 44.6 % (ref 34.8–46.6)
HGB: 15 g/dL (ref 11.6–15.9)
LYMPH%: 18.1 % (ref 14.0–49.7)
MCH: 28.6 pg (ref 25.1–34.0)
MCHC: 33.6 g/dL (ref 31.5–36.0)
MCV: 85.1 fL (ref 79.5–101.0)
MONO#: 0.5 10*3/uL (ref 0.1–0.9)
MONO%: 7.5 % (ref 0.0–14.0)
NEUT#: 4.4 10*3/uL (ref 1.5–6.5)
NEUT%: 71.1 % (ref 38.4–76.8)
Platelets: 262 10*3/uL (ref 145–400)
RBC: 5.25 10*6/uL (ref 3.70–5.45)
RDW: 14.7 % — ABNORMAL HIGH (ref 11.2–14.5)
WBC: 6.2 10*3/uL (ref 3.9–10.3)
lymph#: 1.1 10*3/uL (ref 0.9–3.3)

## 2011-12-31 LAB — VITAMIN D 25 HYDROXY (VIT D DEFICIENCY, FRACTURES): Vit D, 25-Hydroxy: 71 ng/mL (ref 30–89)

## 2012-01-06 ENCOUNTER — Ambulatory Visit (HOSPITAL_BASED_OUTPATIENT_CLINIC_OR_DEPARTMENT_OTHER): Payer: Medicare Other | Admitting: Oncology

## 2012-01-06 ENCOUNTER — Ambulatory Visit: Payer: Medicare Other | Admitting: Oncology

## 2012-01-06 VITALS — BP 153/79 | HR 98 | Temp 98.4°F | Resp 20 | Ht 65.5 in | Wt 166.3 lb

## 2012-01-06 DIAGNOSIS — C50419 Malignant neoplasm of upper-outer quadrant of unspecified female breast: Secondary | ICD-10-CM

## 2012-01-06 DIAGNOSIS — C50919 Malignant neoplasm of unspecified site of unspecified female breast: Secondary | ICD-10-CM

## 2012-01-06 NOTE — Progress Notes (Signed)
Hematology and Oncology Follow Up Visit  Yolanda Peters [id number] Feb 17, 1940 71 y.o. 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  Medications: I have reviewed the patient's current medications.  Allergies:  Allergies  Allergen Reactions  . Penicillins     REACTION: Nausea, swelling    Past Medical History, Surgical history, Social history, and Family History were reviewed and updated.  Review of Systems: Constitutional:  Negative for fever, chills, night sweats, anorexia, weight loss, pain. Cardiovascular: negative Respiratory: negative Neurological: negative Dermatological: negative ENT: negative Skin Gastrointestinal: no abdominal pain, change in bowel habits, or black or bloody stools Genito-Urinary: negative Hematological and Lymphatic: negative Breast: negative for breast lumps Musculoskeletal: negative Remaining ROS negative. Hot flashes totally resolved with peridin c  Physical Exam: Blood pressure 153/79, pulse 98, temperature 98.4 F (36.9 C), temperature source Oral, resp. rate 20, height 5' 5.5" (1.664 m), weight 166 lb 4.8 oz (75.433 kg). ECOG: 0 General appearance: alert, cooperative and appears stated age Head: Normocephalic, without obvious abnormality, atraumatic Neck: no adenopathy, no carotid bruit, no JVD, supple, symmetrical, trachea midline and thyroid not enlarged, symmetric, no tenderness/mass/nodules Lymph nodes: Cervical, supraclavicular, and axillary nodes normal. Cardiac : regular rate and rhythm, no murmurs or gallops Pulmonary:clear to auscultation bilaterally and normal percussion bilaterally Breasts: inspection negative, no nipple discharge or bleeding, no masses or nodularity palpable, rt  breast , reddish blush  With fluctuant area , lateral portion of rt breast Abdomen:soft, non-tender; bowel sounds normal; no masses,  no organomegaly Extremities negative Neuro: alert, oriented, normal speech, no focal findings or movement disorder noted  Lab Results: Lab Results  Component Value Date   WBC 6.2 12/30/2011   HGB 15.0 12/30/2011   HCT 44.6 12/30/2011   MCV 85.1 12/30/2011   PLT 262 12/30/2011     Chemistry      Component Value Date/Time   NA 140 12/30/2011 1057   NA 139 12/30/2010 1338   NA 139 12/30/2010 1338   NA 139 12/30/2010 1338   K 3.8 12/30/2011 1057   K 3.4* 12/30/2010 1338   K 3.4* 12/30/2010 1338   K 3.4* 12/30/2010 1338   CL 101 12/30/2011 1057   CL 100 12/30/2010 1338   CL 100 12/30/2010 1338   CL 100 12/30/2010 1338   CO2 29 12/30/2011 1057   CO2 24 12/30/2010 1338   CO2 24 12/30/2010 1338   CO2 24 12/30/2010 1338   BUN 22.0 12/30/2011 1057   BUN 18 12/30/2010 1338   BUN 18 12/30/2010 1338   BUN 18 12/30/2010 1338   CREATININE 0.8 12/30/2011 1057   CREATININE 0.79 12/30/2010 1338   CREATININE 0.79 12/30/2010 1338   CREATININE 0.79 12/30/2010 1338      Component Value Date/Time   CALCIUM 9.6 12/30/2011 1057   CALCIUM 9.6 12/30/2010 1338   CALCIUM 9.6 12/30/2010 1338   CALCIUM 9.6 12/30/2010 1338   ALKPHOS 101 12/30/2011 1057   ALKPHOS 86 12/30/2010 1338   ALKPHOS 86 12/30/2010 1338   ALKPHOS 86 12/30/2010 1338   AST 23 12/30/2011 1057   AST 21 12/30/2010 1338   AST 21 12/30/2010 1338   AST 21 12/30/2010 1338   ALT 29 12/30/2011 1057   ALT 26 12/30/2010 1338   ALT 26 12/30/2010  1338   ALT 26 12/30/2010 1338   BILITOT 1.01 12/30/2011 1057   BILITOT 1.0 12/30/2010 1338   BILITOT 1.0 12/30/2010 1338   BILITOT 1.0 12/30/2010 1338      .pathology. Radiological Studies: chest X-ray n/a Mammogram 9/13-wnl Bone density n/a  Impression and Plan: Post menopausal woman with hx DCIS, s/p lumpectomy , xrt and on tamoxifen;  doing well, f/u in august 2014. I suggested alternating visits with Dr Abbey Chatters.  More than 50% of the visit was spent in patient-related counselling   Pierce Crane, MD 12/27/20134:36 PM

## 2012-01-24 ENCOUNTER — Encounter (INDEPENDENT_AMBULATORY_CARE_PROVIDER_SITE_OTHER): Payer: Self-pay | Admitting: General Surgery

## 2012-01-24 ENCOUNTER — Ambulatory Visit (INDEPENDENT_AMBULATORY_CARE_PROVIDER_SITE_OTHER): Payer: 59 | Admitting: General Surgery

## 2012-01-24 VITALS — BP 130/68 | HR 84 | Temp 97.8°F | Resp 16 | Ht 65.5 in | Wt 163.8 lb

## 2012-01-24 DIAGNOSIS — Z853 Personal history of malignant neoplasm of breast: Secondary | ICD-10-CM

## 2012-01-24 NOTE — Progress Notes (Signed)
Operation:  Right partial mastectomy and right axillary sentinel lymph node biopsy  Date: August 18, 2009  Stage:  High-grade DCIS  Hormone receptor status: Positive  HPI:  Ms. 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  She still has discoloration and firmness at the right breast partial mastectomy site that is unchanged.  She is taking Arimidex.  She denies any new masses in the right breast and denies any masses in the left breast. No adenopathy.  Her mammogram in September 2013 demonstrated 2 lesions which happened to be cysts. These were aspirated.  PE: Gen.-well-developed, well-nourished, in no acute distress.  Right breast-radiation skin changes noted. Upper-outer scar with firmness and slight purple discoloration which is unchanged. No masses palpated.  Left breast-no palpable masses, suspicious skin changes, or nipple discharge.  Lymph nodes-no palpable supraclavicular, axillary, or cervical adenopathy.  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   Plan:  Return visit in 6 months.  I feel I can alternate visits with the oncologist and thus they could see her back in December of 2014 or January 2015.

## 2012-01-24 NOTE — Patient Instructions (Signed)
Call if you feel any new mass in your breasts.

## 2012-01-26 ENCOUNTER — Telehealth (INDEPENDENT_AMBULATORY_CARE_PROVIDER_SITE_OTHER): Payer: Self-pay | Admitting: General Surgery

## 2012-01-26 NOTE — Telephone Encounter (Signed)
Pt of Dr. Abbey Chatters called to update him on oncology situation.  She is "between" oncologists for the moment, as Dr. Donnie Coffin is no longer with [facility].  They are taking care of his pts on an "immediate need" criteria, so she has not yet been reassigned.  She will keep Dr. Abbey Chatters in the loop when she knows more.  FYI call.

## 2012-05-22 ENCOUNTER — Telehealth: Payer: Self-pay | Admitting: Oncology

## 2012-05-22 NOTE — Telephone Encounter (Signed)
Former PR pt reassigned to [facility]. D/t provided by Misty Stanley. Per 01/06/12 pof pt to follow up in August. lmonvm for pt re appt for new appt/provider and asked that pt call me directly to confirm appt w/KK for 8/1.

## 2012-05-23 ENCOUNTER — Encounter: Payer: Self-pay | Admitting: Oncology

## 2012-05-23 ENCOUNTER — Telehealth: Payer: Self-pay | Admitting: Oncology

## 2012-05-23 NOTE — Telephone Encounter (Signed)
S/w pt today confirming appt for 8/1 @ 2pm with KK. Pt aware this is only an appt w/KK and labs will be determined at visit. Pt has no port.

## 2012-07-09 ENCOUNTER — Other Ambulatory Visit: Payer: Self-pay

## 2012-07-25 ENCOUNTER — Ambulatory Visit (INDEPENDENT_AMBULATORY_CARE_PROVIDER_SITE_OTHER): Payer: 59 | Admitting: General Surgery

## 2012-07-25 ENCOUNTER — Encounter (INDEPENDENT_AMBULATORY_CARE_PROVIDER_SITE_OTHER): Payer: Self-pay | Admitting: General Surgery

## 2012-07-25 VITALS — BP 140/90 | HR 76 | Temp 98.8°F | Resp 16 | Ht 65.5 in | Wt 163.4 lb

## 2012-07-25 DIAGNOSIS — Z853 Personal history of malignant neoplasm of breast: Secondary | ICD-10-CM

## 2012-07-25 NOTE — Patient Instructions (Signed)
Call if you discover any new masses in your breasts. 

## 2012-07-25 NOTE — Progress Notes (Signed)
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  She still has discoloration and firmness at the right breast partial mastectomy site that is unchanged.  She is taking Arimidex.  She denies any new masses in the right breast and denies any masses in the left breast. No adenopathy.  Her mammogram is due in the next month or two. PE: Gen.-well-developed, well-nourished, in no acute distress.  Right breast-radiation skin changes noted. Upper-outer scar with firmness and slight purple discoloration which is unchanged. No masses palpated.  Left breast-no palpable masses, suspicious skin changes, or nipple discharge.  Lymph nodes-no palpable supraclavicular, axillary, or cervical adenopathy.  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   Plan:  Return visit in 6 months.

## 2012-08-10 ENCOUNTER — Ambulatory Visit (HOSPITAL_BASED_OUTPATIENT_CLINIC_OR_DEPARTMENT_OTHER): Payer: Medicare Other | Admitting: Oncology

## 2012-08-10 ENCOUNTER — Encounter: Payer: Self-pay | Admitting: Oncology

## 2012-08-10 ENCOUNTER — Telehealth: Payer: Self-pay | Admitting: Oncology

## 2012-08-10 VITALS — BP 124/77 | HR 96 | Temp 98.0°F | Resp 20 | Ht 65.5 in | Wt 161.5 lb

## 2012-08-10 DIAGNOSIS — D0590 Unspecified type of carcinoma in situ of unspecified breast: Secondary | ICD-10-CM

## 2012-08-10 DIAGNOSIS — C50911 Malignant neoplasm of unspecified site of right female breast: Secondary | ICD-10-CM

## 2012-08-10 DIAGNOSIS — Z170 Estrogen receptor positive status [ER+]: Secondary | ICD-10-CM

## 2012-08-10 NOTE — Patient Instructions (Addendum)
Please have a bone density this year with Dr. Jennette Kettle  Blood work with Dr. Timothy Lasso  Continue arimidex 1 mg daily  I will see you back in May 2015

## 2012-08-20 ENCOUNTER — Other Ambulatory Visit: Payer: Self-pay | Admitting: Oncology

## 2012-08-20 DIAGNOSIS — Z853 Personal history of malignant neoplasm of breast: Secondary | ICD-10-CM

## 2012-09-02 NOTE — Progress Notes (Signed)
OFFICE PROGRESS NOTE  CC*  Gwen Pounds, MD [address] [facility], [place]. [place] [address]  DIAGNOSIS: 72 year old female with history of DCIS  PRIOR THERAPY:  #1 status post lumpectomy 08/18/2009 for DCIS that was ER positive.  #2 she received radiation therapy by Dr. Chipper Herb completed 11/19/2009.  #3 on adjuvant tamoxifen 20 mg daily. Total of 5 years of therapy is planned  CURRENT THERAPY:tamoxifen 20 mg daily  INTERVAL HISTORY: Yolanda Peters 72 y.o. female returns for follow up to establish your care with me. She had been seeing Dr. Donnie Coffin. Overall patient is doing well. She's tolerating tamoxifen quite nicely without any problems. She's had no intercurrent illnesses hospitalizations or any medication changes. She does continue to see Dr. Abbey Chatters were on a regular basis. Remainder of the 10 point review of systems is negative.  MEDICAL HISTORY: Past Medical History  Diagnosis Date  . Arthritis   . Hypertension   . Hyperlipidemia   . Cancer     right breast     ALLERGIES:  is allergic to penicillins.  MEDICATIONS:  Current Outpatient Prescriptions  Medication Sig Dispense Refill  . anastrozole (ARIMIDEX) 1 MG tablet Take 1 tablet (1 mg total) by mouth daily.  90 tablet  3  . beta carotene 19147 UNIT capsule Take 10,000 Units by mouth daily.        . calcium citrate-vitamin D (CITRACAL+D) 315-200 MG-UNIT per tablet Take 1 tablet by mouth 2 (two) times daily.        . hydrochlorothiazide (HYDRODIURIL) 25 MG tablet Take 25 mg by mouth as needed.       . hydroxychloroquine (PLAQUENIL) 200 MG tablet Take by mouth 2 (two) times daily.        Marland Kitchen loratadine (CLARITIN) 10 MG tablet Take 10 mg by mouth daily.      . Misc Natural Products (TART CHERRY ADVANCED PO) Take by mouth as needed. 3 pills      . niacin 100 MG tablet Take 100 mg by mouth as needed.      Marland Kitchen OVER THE COUNTER MEDICATION Take by mouth 2 (two) times daily. Paridin C, takes 4  pills 2 times a day      . TURMERIC PO Take by mouth.      . verapamil (CALAN-SR) 240 MG CR tablet Take 360 mg by mouth daily.       . vitamin E 100 UNIT capsule Take 100 Units by mouth daily.        . VOLTAREN 1 % GEL Ad lib.      . calcium & magnesium carbonates (MYLANTA) 311-232 MG per tablet Take 1 tablet by mouth daily.        Marland Kitchen co-enzyme Q-10 30 MG capsule Take 30 mg by mouth 2 (two) times daily.        Marland Kitchen selenium 50 MCG TABS Take 50 mcg by mouth daily.         No current facility-administered medications for this visit.    SURGICAL HISTORY:  Past Surgical History  Procedure Laterality Date  . Breast ductal system excision      right breast  . Elbow surgery      left  . Cataract extraction      bilateral  . Myomectomy    . Cesarean section    . Breast surgery  august 2011    right partial mastectomy    REVIEW OF SYSTEMS:  Pertinent items are noted in HPI.  HEALTH MAINTENANCE:   PHYSICAL EXAMINATION: Blood pressure 124/77, pulse 96, temperature 98 F (36.7 C), temperature source Oral, resp. rate 20, height 5' 5.5" (1.664 m), weight 161 lb 8 oz (73.256 kg). Body mass index is 26.46 kg/(m^2). ECOG PERFORMANCE STATUS: 0 - Asymptomatic   General appearance: alert, cooperative and appears stated age Resp: clear to auscultation bilaterally Cardio: regular rate and rhythm GI: soft, non-tender; bowel sounds normal; no masses,  no organomegaly Extremities: extremities normal, atraumatic, no cyanosis or edema Neurologic: Grossly normal   LABORATORY DATA: Lab Results  Component Value Date   WBC 6.2 12/30/2011   HGB 15.0 12/30/2011   HCT 44.6 12/30/2011   MCV 85.1 12/30/2011   PLT 262 12/30/2011      Chemistry      Component Value Date/Time   NA 140 12/30/2011 1057   NA 139 12/30/2010 1338   K 3.8 12/30/2011 1057   K 3.4* 12/30/2010 1338   CL 101 12/30/2011 1057   CL 100 12/30/2010 1338   CO2 29 12/30/2011 1057   CO2 24 12/30/2010 1338   BUN 22.0 12/30/2011  1057   BUN 18 12/30/2010 1338   CREATININE 0.8 12/30/2011 1057   CREATININE 0.79 12/30/2010 1338      Component Value Date/Time   CALCIUM 9.6 12/30/2011 1057   CALCIUM 9.6 12/30/2010 1338   ALKPHOS 101 12/30/2011 1057   ALKPHOS 86 12/30/2010 1338   AST 23 12/30/2011 1057   AST 21 12/30/2010 1338   ALT 29 12/30/2011 1057   ALT 26 12/30/2010 1338   BILITOT 1.01 12/30/2011 1057   BILITOT 1.0 12/30/2010 1338       RADIOGRAPHIC STUDIES:  No results found.  ASSESSMENT: 72 year old female with  #1 DCIS status post lumpectomy followed by radiation now on tamoxifen overall she's doing well without any problems no evidence of recurrent disease.   PLAN:   #1 continue tamoxifen 20 mg daily total of 5 years of therapy is planned.  #2 patient will be seen back in 6 months time for followup   All questions were answered. The patient knows to call the clinic with any problems, questions or concerns. We can certainly see the patient much sooner if necessary.  I spent 15 minutes counseling the patient face to face. The total time spent in the appointment was 20 minutes.    Drue Second, MD Medical/Oncology [facility] [phone] (beeper) [phone] (Office)

## 2012-09-05 ENCOUNTER — Other Ambulatory Visit: Payer: Medicare Other | Admitting: Lab

## 2012-09-26 ENCOUNTER — Ambulatory Visit
Admission: RE | Admit: 2012-09-26 | Discharge: 2012-09-26 | Disposition: A | Discharge status: Home / Self Care | Payer: Medicare Other | Source: Ambulatory Visit | Attending: Oncology | Admitting: Oncology

## 2012-09-26 DIAGNOSIS — Z853 Personal history of malignant neoplasm of breast: Secondary | ICD-10-CM

## 2012-11-15 ENCOUNTER — Other Ambulatory Visit: Payer: Self-pay

## 2012-11-27 ENCOUNTER — Other Ambulatory Visit: Payer: Self-pay | Admitting: Oncology

## 2012-11-27 DIAGNOSIS — C50919 Malignant neoplasm of unspecified site of unspecified female breast: Secondary | ICD-10-CM

## 2013-01-28 ENCOUNTER — Encounter (INDEPENDENT_AMBULATORY_CARE_PROVIDER_SITE_OTHER): Payer: Self-pay | Admitting: General Surgery

## 2013-01-28 ENCOUNTER — Ambulatory Visit (INDEPENDENT_AMBULATORY_CARE_PROVIDER_SITE_OTHER): Payer: 59 | Admitting: General Surgery

## 2013-01-28 VITALS — BP 136/88 | HR 84 | Temp 98.2°F | Resp 14 | Ht 65.5 in | Wt 167.6 lb

## 2013-01-28 DIAGNOSIS — C50919 Malignant neoplasm of unspecified site of unspecified female breast: Secondary | ICD-10-CM

## 2013-01-28 NOTE — Progress Notes (Signed)
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  She still has discoloration and firmness at the right breast partial mastectomy site that is unchanged.  She is taking Arimidex.  She denies any new masses in the right breast and denies any masses in the left breast. No adenopathy.  Her mammogram from September 2014 demonstrated no evidence of malignancy.   PE: Gen.-well-developed, well-nourished, in no acute distress.  Right breast-radiation skin changes noted. Upper-outer scar with firmness and slight purple discoloration which is unchanged. No masses palpated.  Left breast-no palpable masses, suspicious skin changes, or nipple discharge.  Lymph nodes-no palpable supraclavicular, axillary, or cervical adenopathy.  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   Plan:  Return visit in August 2015.  Will alternate 6 months visits with Dr. Humphrey Rolls.

## 2013-01-28 NOTE — Patient Instructions (Signed)
Call if you had any new lumps in either breast.

## 2013-05-08 ENCOUNTER — Telehealth: Payer: Self-pay | Admitting: Oncology

## 2013-05-08 NOTE — Telephone Encounter (Signed)
kk out pt to see cp2 6/3. lmonvm for pt and mailed schedule.

## 2013-05-10 ENCOUNTER — Telehealth: Payer: Self-pay | Admitting: Oncology

## 2013-05-10 NOTE — Telephone Encounter (Signed)
, °

## 2013-05-16 ENCOUNTER — Ambulatory Visit: Payer: Medicare Other | Admitting: Oncology

## 2013-06-09 ENCOUNTER — Other Ambulatory Visit: Payer: Self-pay | Admitting: Oncology

## 2013-06-12 ENCOUNTER — Other Ambulatory Visit: Payer: Self-pay | Admitting: *Deleted

## 2013-06-12 ENCOUNTER — Ambulatory Visit: Payer: Medicare Other

## 2013-06-12 ENCOUNTER — Encounter: Payer: Self-pay | Admitting: Oncology

## 2013-06-12 ENCOUNTER — Ambulatory Visit (HOSPITAL_BASED_OUTPATIENT_CLINIC_OR_DEPARTMENT_OTHER): Payer: Medicare Other | Admitting: Oncology

## 2013-06-12 VITALS — BP 142/74 | HR 87 | Temp 98.8°F | Wt 165.0 lb

## 2013-06-12 DIAGNOSIS — D0590 Unspecified type of carcinoma in situ of unspecified breast: Secondary | ICD-10-CM

## 2013-06-12 DIAGNOSIS — D0511 Intraductal carcinoma in situ of right breast: Secondary | ICD-10-CM

## 2013-06-12 DIAGNOSIS — C50919 Malignant neoplasm of unspecified site of unspecified female breast: Secondary | ICD-10-CM

## 2013-06-12 DIAGNOSIS — Z170 Estrogen receptor positive status [ER+]: Secondary | ICD-10-CM

## 2013-06-12 MED ORDER — ANASTROZOLE 1 MG PO TABS
ORAL_TABLET | ORAL | Status: DC
Start: 2013-06-12 — End: 2013-08-01

## 2013-06-12 NOTE — Progress Notes (Signed)
[place]  Telephone:(336) [phone] Fax:(336) [phone]     ID: AKIKO SCHEXNIDER OB: 05-Dec-1940  MR#: [id number]  XBD#:[id number]  PCP: Precious Reel, MD GYN:  Dory Horn SUZella Richer, T OTHER MD:R.Leonor Liv, Chauncey Cruel. Deveshwar Patient is seen, alone for visit, for first time by this physician, previously followed by Dr Eston Esters and by Dr Marcy Panning. Information is from [facility] and present EMR, with history reviewed with patient now.  CHIEF COMPLAINT: follow up DCIS, on Arimidex  BREAST CANCER HISTORY: right DCIS, high grade,  ER + at right partial mastectomy with right sentinel node evaluation 08-18-2009. Radiation completed 11-19-2009. Began Arimidex late Oct or Nov 2011, planned x 5 years.  NOTE previous records mention tamoxifen or Femara, however per patient this has been Arimidex since start of treatment.  Most recent [place] bilateral diagnostic 09-26-2012 with scattered fibroglandular tissue and no mammographic findings of concern.  Most recent  DEXA by Dr Nori Riis 09-18-2012 with normal bone density, including T score -0.2 lumbar spine and - 0.5, -0.8 in femoral necks. Lipids followed by Dr Virgina Jock that information not in this EMR. No significant hot flashes or increased arthralgias. Saw Dr Zella Richer [phone] and will see him again in 6 months (~08-2013); sees Dr Nori Riis yearly; no longer has follow up with Dr Valere Dross   INTERVAL HISTORY: Overall has been well since she was seen last, tolerating the Arimidex with   REVIEW OF SYSTEMS: Residual mass and firmness upper outer right breast since postoperative seroma, but no noted changes in breasts bilaterally including all of this post surgical area. No noted problems with the Arimidex. No SOB or other respiratory symptoms. Good appetite. 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 No LE swelling. No bleeding. No new or different pain. No bleeding.Some chronic arthritis unchanged.  PAST  MEDICAL HISTORY: Past Medical History  Diagnosis Date  . Arthritis   . Hypertension   . Hyperlipidemia   . Cancer     right breast     PAST SURGICAL HISTORY: Past Surgical History  Procedure Laterality Date  . Breast ductal system excision      right breast  . Elbow surgery      left  . Cataract extraction      bilateral  . Myomectomy    . Cesarean section    . Breast surgery  august 2011    right partial mastectomy    FAMILY HISTORY Family History  Problem Relation Age of Onset  . Cancer Mother     bladder  . Cancer Father     leukemia  . Cancer Maternal Aunt     breast  3 maternal aunts with breast cancer at old ages Father with acute leukemia Daughter healthy 2 grandchildren healthy  GYNECOLOGIC HISTORY: G1P1. No hysterectomy. Yearly gyn exams by Dr Nori Riis  SOCIAL HISTORY: married >35 years, lives with husband. 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 Daughter had been Librarian, academic at [employer], now in management position. No tobacco or ETOH.      ADVANCED DIRECTIVES:    HEALTH MAINTENANCE: History  Substance Use Topics  . Smoking status: Never Smoker   . Smokeless tobacco: Never Used  . Alcohol Use: No     Colonoscopy: 11-2008 by Dr Scarlette Shorts, follow up 5 years ENDOSCOPIC IMPRESSION:  1) Diminutive polyp in the proximal transverse colon - removed  2) Small nonspecific focal Ulcer in the ascending colon -bx  3) Moderate diverticulosis in  the left colon  4) Internal hemorrhoids   OVF:IEPPIR Dr Nori Riis  Bone density: 05-1882 normal (Dr Nori Riis)  Lipid panel: Dr Virgina Jock  Allergies  Allergen Reactions  . Penicillins     REACTION: Nausea, swelling    Current Outpatient Prescriptions  Medication Sig Dispense Refill  . beta carotene 10000 UNIT capsule Take 10,000 Units by mouth daily.        . calcium & magnesium carbonates (MYLANTA) 311-232 MG per tablet Take 1 tablet by mouth daily.        . calcium citrate-vitamin D (CITRACAL+D) 315-200 MG-UNIT per tablet  Take 1 tablet by mouth 2 (two) times daily.        . hydrochlorothiazide (HYDRODIURIL) 25 MG tablet Take 25 mg by mouth as needed.       . hydroxychloroquine (PLAQUENIL) 200 MG tablet Take by mouth 2 (two) times daily.        Marland Kitchen loratadine (CLARITIN) 10 MG tablet Take 10 mg by mouth daily.      . niacin 100 MG tablet Take 100 mg by mouth as needed.      Marland Kitchen OVER THE COUNTER MEDICATION Take by mouth 2 (two) times daily. Paridin C, takes 4 pills 2 times a day      . verapamil (CALAN-SR) 240 MG CR tablet Take 360 mg by mouth daily.       . vitamin E 100 UNIT capsule Take 100 Units by mouth daily.        Marland Kitchen anastrozole (ARIMIDEX) 1 MG tablet TAKE 1 TABLET DAILY  90 tablet  2  . co-enzyme Q-10 30 MG capsule Take 30 mg by mouth 2 (two) times daily.       . Misc Natural Products (TART CHERRY ADVANCED PO) Take by mouth as needed. 3 pills      . selenium 50 MCG TABS Take 50 mcg by mouth daily.        . TURMERIC PO Take by mouth.      . VOLTAREN 1 % GEL Ad lib.       No current facility-administered medications for this visit.    OBJECTIVE: Filed Vitals:   06/12/13 1310  BP: 142/74  Pulse: 87  Temp: 98.8 F (37.1 C)     Body mass index is 27.03 kg/(m^2).    ECOG FS:0  Ocular: Sclerae unicteric, pupils equal, round and reactive to light Ear-nose-throat: Oropharynx clear, no obvious dental problems Lymphatic: No cervical or supraclavicular adenopathy, no axillary or inguinal adenopathy Lungs no rales or rhonchi, good excursion bilaterally Heart regular rate and rhythm, no murmur appreciated, no gallop Abd soft, nontender, positive bowel sounds. No HSM or mass. MSK no focal spinal tenderness, no joint edema. LE without pitting edema, cords, tenderness Neuro: non-focal, well-oriented, appropriate affect Breasts: right with partial mastectomy scar with firmness of tissue upper outer breast and smooth mass area just at the scar~3-4 cm diameter, otherwise bilaterally no dominant mass, no skin or nipple  findings. Axillae benign  LAB RESULTS: CBC diff and CMET by Dr Georga Hacking 02-19-2013 brought by patient and will be scanned into this EMR, results as follows: WBC 4.7, ANC 2.7, Hgb 14.5, plt 256k, MCV 84.8 CMET NA 142, K 4.0, Cl 102, CO2 32, glu 68, BUN 22, creat 0.74, Tbili 0.9, AP 89, AST 17, ALT 18, Tprot 6.2, alb 4.3, Ca 9.4    STUDIES: DIGITAL DIAGNOSTIC BILATERAL MAMMOGRAM WITH CAD   [facility] 09-26-2012 Comparison: 09/20/2010, 06/14/2010, 09/15/2009  Findings:  ACR Breast Density  Category b: There are scattered areas of  fibroglandular density.  There is a large seroma at the lumpectomy site in the right upper  outer quadrant. This is slightly smaller than in 2012. There is  no suspicious dominant mass, nonsurgical architectural distortion,  or calcification to suggest malignancy.  Mammographic images were processed with CAD.  IMPRESSION:  No mammographic evidence of malignancy.    DISCUSSION: history above reviewed. Discussed possible side effects of the Arimidex, which fortunately she seems to have mostly avoided. Discussed 5 year length of treatment. Discussed residual changes in right breast. All questions answered to the best of my ability. Patient in agreement with treatment plan and follow up. She knows that she can call if questions or concerns prior to next visit.   ASSESSMENT/ Plan 1. 73 y.o. with DCIS right breast, high grade and ER +, post partial mastectomy and RT, and on Arimidex since ~ Nov 2011. She will see Dr Zella Richer ~ August and have bilateral mammograms 09-2013. She will see medical oncology ~ 6 months after Dr Zella Richer. Plan Arimidex x 5 years, which will be ~ Nov 2016. 2.normal bone density 09-2012. Continuing calcium + D and regular exercising. Repeat bone density scan in 2 years is reasonable even on the high risk aromatase inhibitor given this good result. 3.arthritis, stable and followed by Dr Estanislado Pandy 4.diverticulosis and diminutive polyp  at last colonoscopy 2010, next due this fall  Time spent 30 min including >50% counseling and coordination of care   Gordy Levan, MD   06/12/2013 8:18 PM

## 2013-06-12 NOTE — Patient Instructions (Signed)
Mammogram due ~ 09-27-13, if you want to set it up yourself.  Call if you need anything prior to next scheduled appointment, which should be ~ 6 months after Dr Zella Richer.  Continue calcium with D and good exercise

## 2013-07-09 ENCOUNTER — Other Ambulatory Visit: Payer: Self-pay

## 2013-08-01 ENCOUNTER — Other Ambulatory Visit: Payer: Self-pay | Admitting: Oncology

## 2013-08-01 DIAGNOSIS — D0591 Unspecified type of carcinoma in situ of right breast: Secondary | ICD-10-CM

## 2013-08-19 ENCOUNTER — Other Ambulatory Visit (INDEPENDENT_AMBULATORY_CARE_PROVIDER_SITE_OTHER): Payer: Self-pay | Admitting: General Surgery

## 2013-08-19 DIAGNOSIS — Z853 Personal history of malignant neoplasm of breast: Secondary | ICD-10-CM

## 2013-08-22 ENCOUNTER — Ambulatory Visit (INDEPENDENT_AMBULATORY_CARE_PROVIDER_SITE_OTHER): Payer: 59 | Admitting: General Surgery

## 2013-08-22 VITALS — BP 116/68 | HR 83 | Temp 98.0°F | Ht 65.5 in | Wt 165.0 lb

## 2013-08-22 DIAGNOSIS — C50911 Malignant neoplasm of unspecified site of right female breast: Secondary | ICD-10-CM

## 2013-08-22 DIAGNOSIS — C50919 Malignant neoplasm of unspecified site of unspecified female breast: Secondary | ICD-10-CM

## 2013-08-22 NOTE — Progress Notes (Signed)
Operation:  Right partial mastectomy and right axillary sentinel lymph node biopsy  Date: August 18, 2009  Stage:  High-grade DCIS  Hormone receptor status: Positive  HPI:  Ms. [place]val[place]burgh is here for another long-term followup visit for her right breast DCIS.  She still has discoloration and firmness at the right breast partial mastectomy site that is unchanged.  She is taking Arimidex.  She denies any new masses in the right breast and denies any masses in the left breast. No adenopathy.  Her mammogram is due next month.   PE: Gen.-well-developed, well-nourished, in no acute distress.  Right breast-radiation skin changes noted. Upper-outer scar with firmness and slight purple discoloration which is unchanged. No masses palpated.  Left breast-no palpable masses, suspicious skin changes, or nipple discharge.  Lymph nodes-no palpable supraclavicular, axillary, or cervical adenopathy.  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   Plan:  Return visit in one year.

## 2013-08-22 NOTE — Patient Instructions (Signed)
Call if you find any new lumps in your breasts or chest wall. 

## 2013-09-07 ENCOUNTER — Telehealth: Payer: Self-pay | Admitting: Internal Medicine

## 2013-09-27 ENCOUNTER — Ambulatory Visit
Admission: RE | Admit: 2013-09-27 | Discharge: 2013-09-27 | Disposition: A | Discharge status: Home / Self Care | Payer: 59 | Source: Ambulatory Visit | Attending: General Surgery | Admitting: General Surgery

## 2013-09-27 DIAGNOSIS — Z853 Personal history of malignant neoplasm of breast: Secondary | ICD-10-CM

## 2013-10-23 ENCOUNTER — Encounter: Payer: Self-pay | Admitting: Internal Medicine

## 2013-11-13 ENCOUNTER — Encounter: Payer: Self-pay | Admitting: Internal Medicine

## 2013-12-09 ENCOUNTER — Encounter: Payer: Self-pay | Admitting: *Deleted

## 2013-12-09 ENCOUNTER — Telehealth: Payer: Self-pay | Admitting: Oncology

## 2013-12-09 NOTE — Telephone Encounter (Signed)
, °

## 2013-12-31 ENCOUNTER — Ambulatory Visit (AMBULATORY_SURGERY_CENTER): Payer: Self-pay | Admitting: *Deleted

## 2013-12-31 VITALS — Ht 65.5 in | Wt 163.0 lb

## 2013-12-31 DIAGNOSIS — Z86010 Personal history of colonic polyps: Secondary | ICD-10-CM

## 2013-12-31 MED ORDER — MOVIPREP 100 G PO SOLR: ORAL | Status: DC

## 2013-12-31 NOTE — Progress Notes (Signed)
No egg or soy allergy  No anesthesia or intubation problems per pt  No diet medications taken  Registered in EMMI   

## 2014-01-13 ENCOUNTER — Ambulatory Visit (AMBULATORY_SURGERY_CENTER): Payer: Medicare Other | Admitting: Internal Medicine

## 2014-01-13 ENCOUNTER — Encounter: Payer: Self-pay | Admitting: Internal Medicine

## 2014-01-13 VITALS — BP 146/75 | HR 79 | Temp 97.8°F | Resp 28 | Ht 65.5 in | Wt 163.0 lb

## 2014-01-13 DIAGNOSIS — K635 Polyp of colon: Secondary | ICD-10-CM

## 2014-01-13 DIAGNOSIS — D122 Benign neoplasm of ascending colon: Secondary | ICD-10-CM

## 2014-01-13 DIAGNOSIS — D125 Benign neoplasm of sigmoid colon: Secondary | ICD-10-CM

## 2014-01-13 DIAGNOSIS — Z86010 Personal history of colonic polyps: Secondary | ICD-10-CM

## 2014-01-13 MED ORDER — SODIUM CHLORIDE 0.9 % IV SOLN: 500.0000 mL | INTRAVENOUS | Status: DC

## 2014-01-13 NOTE — Progress Notes (Signed)
Called to room to assist during endoscopic procedure.  Patient ID and intended procedure confirmed with present staff. Received instructions for my participation in the procedure from the performing physician.  

## 2014-01-13 NOTE — Op Note (Signed)
[facility]  [address] [place], [address]   COLONOSCOPY PROCEDURE REPORT  PATIENT: Yolanda, Peters  MR#: [id number] BIRTHDATE: 1940/07/16 , 73  yrs. old GENDER: female ENDOSCOPIST: Eustace Quail, MD REFERRED [id number] Program Recall PROCEDURE DATE:  01/13/2014 PROCEDURE:   Colonoscopy with snare polypectomy x 3 First Screening Colonoscopy - Avg.  risk and is 50 yrs.  old or older - No.  Prior Negative Screening - Now for repeat screening. N/A  History of Adenoma - Now for follow-up colonoscopy & has been > or = to 3 yrs.  Yes hx of adenoma.  Has been 3 or more years since last colonoscopy.  Polyps Removed Today? Yes. ASA CLASS:   Class II INDICATIONS:surveillance colonoscopy based on a history of adenomatous colonic polyp(s).  Prior exams 2006, 2010 (small TAs) MEDICATIONS: Monitored anesthesia care and Propofol 200 mg IV  DESCRIPTION OF PROCEDURE:   After the risks benefits and alternatives of the procedure were thoroughly explained, informed consent was obtained.  The digital rectal exam revealed no abnormalities of the rectum.   The LB [id number]  endoscope was introduced through the anus and advanced to the cecum, which was identified by both the appendix and ileocecal valve. No adverse events experienced.   The quality of the prep was good, using MoviPrep  The instrument was then slowly withdrawn as the colon was fully examined.   COLON FINDINGS: Three polyps ranging between 3-75mm in size were found in the sigmoid (2) and ascending colon.  A polypectomy was performed with a cold snare.  The resection was complete, the polyp tissue was completely retrieved and sent to histology.   There was moderate diverticulosis noted in the left colon.   The examination was otherwise normal.  Retroflexed views revealed no abnormalities. The time to cecum=3 minutes 0 seconds.  Withdrawal time=13 minutes 10 seconds.  The scope was withdrawn and the  procedure completed. COMPLICATIONS: There were no immediate complications.  ENDOSCOPIC IMPRESSION: 1.   Three polyps were found in the sigmoid colon and ascending colon; polypectomy was performed with a cold snare 2.   Moderate diverticulosis was noted in the left colon 3.   The examination was otherwise normal  RECOMMENDATIONS: 1. Repeat colonoscopy in 5 years if polyp adenomatous; otherwise prn  eSigned:  Eustace Quail, MD 01/13/2014 9:05 AM   cc: Shon Baton, MD and The Patient

## 2014-01-13 NOTE — Patient Instructions (Signed)
YOU HAD AN ENDOSCOPIC PROCEDURE TODAY AT THE [facility]: Refer to the procedure report that was given to you for any specific questions about what was found during the examination.  If the procedure report does not answer your questions, please call your gastroenterologist to clarify.  If you requested that your care partner not be given the details of your procedure findings, then the procedure report has been included in a sealed envelope for you to review at your convenience later.  YOU SHOULD EXPECT: Some feelings of bloating in the abdomen. Passage of more gas than usual.  Walking can help get rid of the air that was put into your GI tract during the procedure and reduce the bloating. If you had a lower endoscopy (such as a colonoscopy or flexible sigmoidoscopy) you may notice spotting of blood in your stool or on the toilet paper. If you underwent a bowel prep for your procedure, then you may not have a normal bowel movement for a few days.  DIET: Your first meal following the procedure should be a light meal and then it is ok to progress to your normal diet.  A half-sandwich or bowl of soup is an example of a good first meal.  Heavy or fried foods are harder to digest and may make you feel nauseous or bloated.  Likewise meals heavy in dairy and vegetables can cause extra gas to form and this can also increase the bloating.  Drink plenty of fluids but you should avoid alcoholic beverages for 24 hours.  ACTIVITY: Your care partner should take you home directly after the procedure.  You should plan to take it easy, moving slowly for the rest of the day.  You can resume normal activity the day after the procedure however you should NOT DRIVE or use heavy machinery for 24 hours (because of the sedation medicines used during the test).    SYMPTOMS TO REPORT IMMEDIATELY: A gastroenterologist can be reached at any hour.  During normal business hours, 8:30 AM to 5:00 PM Monday through Friday,  call [phone].  After hours and on weekends, please call the GI answering service at [phone] who will take a message and have the physician on call contact you.   Following lower endoscopy (colonoscopy or flexible sigmoidoscopy):  Excessive amounts of blood in the stool  Significant tenderness or worsening of abdominal pains  Swelling of the abdomen that is new, acute  Fever of 100F or higher  FOLLOW UP: If any biopsies were taken you will be contacted by phone or by letter within the next 1-3 weeks.  Call your gastroenterologist if you have not heard about the biopsies in 3 weeks.  Our staff will call the home number listed on your records the next business day following your procedure to check on you and address any questions or concerns that you may have at that time regarding the information given to you following your procedure. This is a courtesy call and so if there is no answer at the home number and we have not heard from you through the emergency physician on call, we will assume that you have returned to your regular daily activities without incident.  SIGNATURES/CONFIDENTIALITY: You and/or your care partner have signed paperwork which will be entered into your electronic medical record.  These signatures attest to the fact that that the information above on your After Visit Summary has been reviewed and is understood.  Full responsibility of the confidentiality of this   discharge information lies with you and/or your care-partner.     Handouts were given to your care partner on polyps, diverticulosis, and a high fiber diet with liberal fluid intake. You may resume your current medications today. Await biopsy results. Please call if any questions or concerns.   

## 2014-01-13 NOTE — Progress Notes (Signed)
A/ox3 pleased with MAC, report to Annette RN 

## 2014-01-13 NOTE — Progress Notes (Signed)
No problems noted in the recovery room. maw 

## 2014-01-14 ENCOUNTER — Telehealth: Payer: Self-pay | Admitting: *Deleted

## 2014-01-14 ENCOUNTER — Encounter: Payer: 59 | Admitting: Internal Medicine

## 2014-01-14 NOTE — Telephone Encounter (Signed)
  Follow up Call-  Call back number 01/13/2014  Post procedure Call Back phone  # [phone]  Permission to leave phone message Yes     Patient questions:  Do you have a fever, pain , or abdominal swelling? No. Pain Score  0 *  Have you tolerated food without any problems? Yes.    Have you been able to return to your normal activities? Yes.    Do you have any questions about your discharge instructions: Diet   No. Medications  No. Follow up visit  No.  Do you have questions or concerns about your Care? No.  Actions: * If pain score is 4 or above: No action needed, pain <4.

## 2014-01-16 ENCOUNTER — Encounter: Payer: Self-pay | Admitting: Internal Medicine

## 2014-02-21 ENCOUNTER — Other Ambulatory Visit: Payer: Self-pay

## 2014-02-21 DIAGNOSIS — C50911 Malignant neoplasm of unspecified site of right female breast: Secondary | ICD-10-CM

## 2014-02-23 ENCOUNTER — Other Ambulatory Visit: Payer: Self-pay | Admitting: Oncology

## 2014-02-24 ENCOUNTER — Other Ambulatory Visit: Payer: 59

## 2014-02-24 ENCOUNTER — Telehealth: Payer: Self-pay | Admitting: Oncology

## 2014-02-24 ENCOUNTER — Ambulatory Visit (HOSPITAL_BASED_OUTPATIENT_CLINIC_OR_DEPARTMENT_OTHER): Payer: Medicare Other | Admitting: Oncology

## 2014-02-24 ENCOUNTER — Other Ambulatory Visit (HOSPITAL_BASED_OUTPATIENT_CLINIC_OR_DEPARTMENT_OTHER): Payer: Medicare Other

## 2014-02-24 ENCOUNTER — Encounter: Payer: Self-pay | Admitting: Oncology

## 2014-02-24 ENCOUNTER — Ambulatory Visit: Payer: 59 | Admitting: Oncology

## 2014-02-24 VITALS — BP 145/71 | HR 88 | Temp 97.3°F | Resp 18 | Ht 65.5 in | Wt 167.1 lb

## 2014-02-24 DIAGNOSIS — C50911 Malignant neoplasm of unspecified site of right female breast: Secondary | ICD-10-CM

## 2014-02-24 DIAGNOSIS — Z170 Estrogen receptor positive status [ER+]: Secondary | ICD-10-CM

## 2014-02-24 DIAGNOSIS — D0511 Intraductal carcinoma in situ of right breast: Secondary | ICD-10-CM

## 2014-02-24 DIAGNOSIS — M1990 Unspecified osteoarthritis, unspecified site: Secondary | ICD-10-CM

## 2014-02-24 DIAGNOSIS — E785 Hyperlipidemia, unspecified: Secondary | ICD-10-CM | POA: Insufficient documentation

## 2014-02-24 DIAGNOSIS — I10 Essential (primary) hypertension: Secondary | ICD-10-CM

## 2014-02-24 DIAGNOSIS — M069 Rheumatoid arthritis, unspecified: Secondary | ICD-10-CM | POA: Insufficient documentation

## 2014-02-24 LAB — CBC WITH DIFFERENTIAL/PLATELET
BASO%: 0.8 % (ref 0.0–2.0)
Basophils Absolute: 0 10*3/uL (ref 0.0–0.1)
EOS%: 4.7 % (ref 0.0–7.0)
Eosinophils Absolute: 0.3 10*3/uL (ref 0.0–0.5)
HCT: 44.8 % (ref 34.8–46.6)
HGB: 14.2 g/dL (ref 11.6–15.9)
LYMPH%: 18.9 % (ref 14.0–49.7)
MCH: 27.4 pg (ref 25.1–34.0)
MCHC: 31.8 g/dL (ref 31.5–36.0)
MCV: 86.3 fL (ref 79.5–101.0)
MONO#: 0.7 10*3/uL (ref 0.1–0.9)
MONO%: 11.7 % (ref 0.0–14.0)
NEUT#: 3.8 10*3/uL (ref 1.5–6.5)
NEUT%: 63.9 % (ref 38.4–76.8)
Platelets: 256 10*3/uL (ref 145–400)
RBC: 5.2 10*6/uL (ref 3.70–5.45)
RDW: 14.2 % (ref 11.2–14.5)
WBC: 5.9 10*3/uL (ref 3.9–10.3)
lymph#: 1.1 10*3/uL (ref 0.9–3.3)

## 2014-02-24 LAB — COMPREHENSIVE METABOLIC PANEL (CC13)
ALT: 23 U/L (ref 0–55)
AST: 17 U/L (ref 5–34)
Albumin: 3.9 g/dL (ref 3.5–5.0)
Alkaline Phosphatase: 93 U/L (ref 40–150)
Anion Gap: 10 mEq/L (ref 3–11)
BUN: 20.2 mg/dL (ref 7.0–26.0)
CO2: 30 mEq/L — ABNORMAL HIGH (ref 22–29)
Calcium: 9.3 mg/dL (ref 8.4–10.4)
Chloride: 103 mEq/L (ref 98–109)
Creatinine: 0.8 mg/dL (ref 0.6–1.1)
EGFR: 79 mL/min/{1.73_m2} — ABNORMAL LOW (ref >90)
Glucose: 88 mg/dl (ref 70–140)
Potassium: 4.1 mEq/L (ref 3.5–5.1)
Sodium: 142 mEq/L (ref 136–145)
Total Bilirubin: 0.76 mg/dL (ref 0.20–1.20)
Total Protein: 6.6 g/dL (ref 6.4–8.3)

## 2014-02-24 NOTE — Patient Instructions (Signed)
You should have bone density scan at Dr Verlon Au office in ~ Sept 2016, particularly as you have been on the Arimidex.  OK to discontinue Arimidex in ~ late Oct or Nov, whenever you run out of the prescription around then, as that will be 5 years on the hormone blocker.

## 2014-02-24 NOTE — Telephone Encounter (Signed)
, °

## 2014-02-24 NOTE — Progress Notes (Signed)
OFFICE PROGRESS NOTE   February 24, 2014   Physicians: J.Russo, R.Neal, T.Rosenbower, R.Murray, J.Perry, S. 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 She will complete 5 years of aromatase inhibitor in ~ late Oct 2016, and will stop that medication then. She is tolerating Arimidex without obvious difficulty. Last mammograms were at [facility] 09-27-13 (diagnostic tomo bilateral), with scattered densities and no mammographic findings of concern. She saw Dr Zella Richer last in ~ Aug 2016 and will see him again ~ 6 mo from now. Last DEXA was 09-18-2012 at Dr Verlon Au office, normal bone density then.  Patient has no complaints related to the breast cancer or ongoing aromatase inhibitor treatment. She had bronchitis in fall which required antibiotics and steroids to resolve, no respiratory symptoms now.   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 Radiation completed 11-19-2009. Began Arimidex late Oct or Nov 2011, planned x 5 years.  NOTE previous records mention tamoxifen or Femara, however per patient this has been Arimidex since start of treatment.  Most recent [place] bilateral diagnostic 09-26-2012 with scattered fibroglandular tissue and no mammographic findings of concern.  Most recent DEXA by Dr Nori Riis 09-18-2012 with normal bone density, including T score -0.2 lumbar spine and - 0.5, -0.8 in femoral necks. Lipids followed by Dr Virgina Jock that information not in this EMR. No significant hot flashes or increased arthralgias. Saw Dr Zella Richer [phone] and will see him again in 6 months (~08-2013); sees Dr Nori Riis yearly; no longer has follow up with Dr Valere Dross    Review of systems as above, also: good energy and appetite.No new or different  pain. No respiratory, GI, musculoskeletal complaints. No bleeding Remainder of 10 point Review of Systems negative.  Objective:  Vital signs in last 24 hours:  BP 145/71 mmHg  Pulse 88  Temp(Src) 97.3 F (36.3 C) (Oral)  Resp 18  Ht 5' 5.5" (1.664 m)  Wt 167 lb 1.6 oz (75.796 kg)  BMI 27.37 kg/m2 weight stable  Alert, oriented and appropriate. Ambulatory without difficulty, looks comfortable   HEENT:PERRL, sclerae not icteric. Oral mucosa moist without lesions, posterior pharynx clear.  Neck supple.   Lymphatics:no cervical,supraclavicular, axillary adenopathy Resp: clear to auscultation bilaterally and normal percussion bilaterally Cardio: regular rate and rhythm. No gallop. GI: soft, nontender, not distended, no mass or organomegaly. Normally active bowel sounds.  Musculoskeletal/ Extremities: without pitting edema, cords, tenderness. Arthritis changes fingers and hands bilaterally Neuro: nonfocal  Psych: appropriate mood and affect Skin without rash, ecchymosis, petechiae Breasts: Right surgical scar well healed, otherwise bilaterally without dominant mass, skin or nipple findings. Axillae benign.   Lab Results:  Results for orders placed or performed in visit on 02/24/14  CBC with Differential  Result Value Ref Range   WBC 5.9 3.9 - 10.3 10e3/uL   NEUT# 3.8 1.5 - 6.5 10e3/uL   HGB 14.2 11.6 - 15.9 g/dL   HCT 44.8 34.8 - 46.6 %   Platelets 256 145 - 400 10e3/uL   MCV 86.3 79.5 - 101.0 fL   MCH 27.4 25.1 - 34.0 pg   MCHC 31.8 31.5 - 36.0 g/dL   RBC 5.20 3.70 - 5.45 10e6/uL   RDW 14.2 11.2 - 14.5 %   lymph# 1.1 0.9 - 3.3 10e3/uL   MONO# 0.7 0.1 -  0.9 10e3/uL   Eosinophils Absolute 0.3 0.0 - 0.5 10e3/uL   Basophils Absolute 0.0 0.0 - 0.1 10e3/uL   NEUT% 63.9 38.4 - 76.8 %   LYMPH% 18.9 14.0 - 49.7 %   MONO% 11.7 0.0 - 14.0 %   EOS% 4.7 0.0 - 7.0 %   BASO% 0.8 0.0 - 2.0 %  Comprehensive metabolic panel (Cmet) - [facility]  Result Value Ref Range   Sodium 142 136 -  145 mEq/L   Potassium 4.1 3.5 - 5.1 mEq/L   Chloride 103 98 - 109 mEq/L   CO2 30 (H) 22 - 29 mEq/L   Glucose 88 70 - 140 mg/dl   BUN 20.2 7.0 - 26.0 mg/dL   Creatinine 0.8 0.6 - 1.1 mg/dL   Total Bilirubin 0.76 0.20 - 1.20 mg/dL   Alkaline Phosphatase 93 40 - 150 U/L   AST 17 5 - 34 U/L   ALT 23 0 - 55 U/L   Total Protein 6.6 6.4 - 8.3 g/dL   Albumin 3.9 3.5 - 5.0 g/dL   Calcium 9.3 8.4 - 10.4 mg/dL   Anion Gap 10 3 - 11 mEq/L   EGFR 79 (L) >90 ml/min/1.73 m2    Lipids are done by PCP Studies/Results:  No results found. She is aware that she should have bone density scan ~ Sept 2016.   Medications: I have reviewed the patient's current medications. She will stop Arimidex when she completes prescription around late Oct/ Nov.  DISCUSSION: Patient hopes that she will soon be finished with follow up at this office, but does agree to let me see her in follow up of the bone density and of completion of 5 years aromatase inhibitor.  Assessment/Plan:  1. 74 y.o. with DCIS right breast, high grade and ER +, post partial mastectomy and RT, and on Arimidex since ~ Nov 2011, which will complete 5 years ~ Nov 2016. I will see her around completion of the Arimidex, then probably prn at this office if that is still her preference. 2.normal bone density 09-2012. Continuing calcium + D and regular exercising. Repeat bone density scan 09-2014 appropriate due to high risk aromatase inhibitor. 3.arthritis, stable and followed by Dr Estanislado Pandy 4.diverticulosis and diminutive polyp at last colonoscopy 2010, next due this fall 5.flu vaccine done  She understands that she can call at any time if concerns prior to next scheduled visit. Cc this note Drs Virgina Jock, Zella Richer, Janine Limbo, MD   02/24/2014, 1:23 PM

## 2014-02-25 ENCOUNTER — Telehealth: Payer: Self-pay | Admitting: Oncology

## 2014-02-25 NOTE — Telephone Encounter (Signed)
, °

## 2014-03-10 ENCOUNTER — Ambulatory Visit: Payer: Self-pay | Admitting: Oncology

## 2014-03-10 ENCOUNTER — Other Ambulatory Visit: Payer: Medicare Other

## 2014-04-05 ENCOUNTER — Other Ambulatory Visit: Payer: Self-pay | Admitting: Oncology

## 2014-04-05 DIAGNOSIS — C50919 Malignant neoplasm of unspecified site of unspecified female breast: Secondary | ICD-10-CM

## 2014-05-23 ENCOUNTER — Encounter: Payer: Self-pay | Admitting: Internal Medicine

## 2014-08-25 ENCOUNTER — Other Ambulatory Visit: Payer: Self-pay | Admitting: Oncology

## 2014-08-25 DIAGNOSIS — Z853 Personal history of malignant neoplasm of breast: Secondary | ICD-10-CM

## 2014-09-17 ENCOUNTER — Ambulatory Visit: Payer: Medicare Other | Attending: Rheumatology

## 2014-09-17 DIAGNOSIS — R29898 Other symptoms and signs involving the musculoskeletal system: Secondary | ICD-10-CM | POA: Diagnosis present

## 2014-09-17 DIAGNOSIS — M25561 Pain in right knee: Secondary | ICD-10-CM | POA: Diagnosis present

## 2014-09-17 NOTE — Patient Instructions (Signed)
KNEE: Extension, Long Arc Quads - Sitting   Raise leg until knee is straight.  Hold 5 seconds 10___ reps per set, _2__ sets per day  Copyright  VHI. All rights reserved.  HIP: Hamstrings - Short Sitting   Rest leg on raised surface. Keep knee straight. Lift chest. Hold _20__ seconds. _3__ reps per set, _3__ sets per day  Copyright  VHI. All rights reserved.  Straight Leg Raise   Tighten top of left thigh. Raise leg off bed ___ inches. Hold for ___ seconds. Relax for ___ seconds. Repeat _2-3x5__ times. Do _2__ times a day. Repeat with other leg.    Copyright  VHI. All rights reserved.  ABDUCTION: Side-Lying (Active)   Lie on left side, top leg straight. Raise top leg as far as possible. Use ___ lbs. Complete 2___ sets of _10__ repetitions. Perform __2_ sessions per day.  http://gtsc.exer.us/95   Copyright  VHI. All rights reserved.  [facility] [address], [place], [place] [address] Phone # [phone] Fax [phone]

## 2014-09-17 NOTE — Therapy (Signed)
[facility] [address] [place], [place], [address] Phone: [phone]   Fax:  [phone]  Physical Therapy Evaluation  Patient Details  Name: Yolanda Peters MRN: [id number] Date of Birth: 31-Jan-1940 Referring Provider:  Eliezer Lofts, PA-C  Encounter Date: 09/17/2014      PT End of Session - 09/17/14 1636    Visit Number 1   Number of Visits 10   Date for PT Re-Evaluation 11/12/14   PT Start Time 1601   PT Stop Time 1635   PT Time Calculation (min) 34 min   Activity Tolerance Patient tolerated treatment well   Behavior During Therapy [facility] for tasks assessed/performed      Past Medical History  Diagnosis Date  . Arthritis   . Hypertension   . Allergy   . Cancer     right breast   . Hyperlipidemia     takes Niacin    Past Surgical History  Procedure Laterality Date  . Breast ductal system excision      right breast  . Elbow surgery      left  . Cataract extraction      bilateral  . Myomectomy    . Cesarean section    . Breast surgery  august 2011    right partial mastectomy    There were no vitals filed for this visit.  Visit Diagnosis:  Right knee pain - Plan: PT plan of care cert/re-cert  Right leg weakness - Plan: PT plan of care cert/re-cert      Subjective Assessment - 09/17/14 1605    Subjective Pt presents to PT with complaints of Rt knee pain and intermittent "locking" of the knee joint.  Pt reports she is modifying her movement to avoid this.  Pt reports that she has significant "bone on bone arthritis"     Pertinent History rheumatoid arthritis   Limitations Standing   How long can you stand comfortably? limited to longer periods   Diagnostic tests x-ray per pt report: OA   Patient Stated Goals reduce knee locking, standing longer, use step-over-step gait with negotiating steps   Currently in Pain? Yes   Pain Score 3    Pain Location Knee   Pain Orientation Right   Pain  Descriptors / Indicators Discomfort   Pain Type Chronic pain   Pain Onset More than a month ago   Pain Frequency Intermittent   Aggravating Factors  weather changes   Pain Relieving Factors ignore it, Aleve, Biofreeze            [facility] PT Assessment - 09/17/14 0001    Assessment   Medical Diagnosis Rt knee OA and pain (knee locks at times)   Onset Date/Surgical Date 09/16/12   Precautions   Precautions Other (comment)   Precaution Comments history of cancer- no [place]   Restrictions   Weight Bearing Restrictions No   Balance Screen   Has the patient fallen in the past 6 months No   Has the patient had a decrease in activity level because of a fear of falling?  No   Is the patient reluctant to leave their home because of a fear of falling?  No   Home Social worker Private residence   Home Access Stairs to enter   Entrance Stairs-Number of Steps 2   [place] One level   Prior Function   Level of [place] Retired   Leisure Engineer, production [employer]  3x/wk, gym exercise 2x/wk   Cognition   Overall Cognitive Status Within Functional Limits for tasks assessed   Observation/Other Assessments   Focus on Therapeutic Outcomes (FOTO)  34% limitation   Posture/Postural Control   Posture/Postural Control Postural limitations   Postural Limitations --  Genu valgus- Rt knee, IR at bilateral hips   ROM / Strength   AROM / PROM / Strength AROM;PROM;Strength   AROM   Overall AROM  Within functional limits for tasks performed   Overall AROM Comments Full Lt and Rt knee AROM.  Hip ER limited by 25% bilaterally   PROM   Overall PROM  Deficits   Overall PROM Comments Hip ER limited by 25% and hamstring flexibility limited by 20% bil.     Strength   Overall Strength Deficits   Overall Strength Comments SLR Rt and Lt 4/5 with lag, Rt hip abduction 4/5, Rt knee 4+/5    Palpation   Patella mobility patellar mobility limited with superior/inferior glides  and crepitus noted with mobs.   Palpation comment No palpable tenderness today   Ambulation/Gait   Ambulation/Gait Yes   Ambulation/Gait Assistance 7: Independent   Stairs Yes   Stairs Assistance 7: Independent   Stair Management Technique Step to pattern  per pt report                           PT Education - 09/17/14 1630    Education provided Yes   Education Details HEP: long arc quads, seated hamstring stretch, sidelying hip abduction, SLR   Person(s) Educated Patient   Methods Explanation;Demonstration;Handout   Comprehension Verbalized understanding;Returned demonstration          PT Short Term Goals - 09/17/14 1640    PT SHORT TERM GOAL #1   Title be independent in initial HEP   Time 4   Period Weeks   Status New   PT SHORT TERM GOAL #2   Title report a 25% reduction in episodes of Rt knee "locking" with standing activity   Time 4   Period Weeks   Status New   PT SHORT TERM GOAL #3   Title ascend and descend steps with step-over-step gait >or = to 25% of the time   Period Weeks   Status New           PT Long Term Goals - 09/17/14 1641    PT LONG TERM GOAL #1   Title be independent in advanced HEP   Time 8   Period Weeks   Status New   PT LONG TERM GOAL #2   Title report a 60% reduction in Rt knee "locking" with standing activity   Time 8   Period Weeks   Status New   PT LONG TERM GOAL #3   Title ascend and descend steps with step-over-step gait > or = to 60%  of the time   Time 8   Period Weeks   Status New   PT LONG TERM GOAL #4   Title demonstrate 4+/5 Rt hip flexion and abduction strength to improve stability with walking   Time 8   Period Weeks   Status New   PT LONG TERM GOAL #5   Title report a 60% reduction in Rt knee pain with standing activity   Time 8   Period Weeks   Status New               Plan - 09/17/14 1636  Clinical Impression Statement Pt presents to PT with complaints of intermittent Rt  knee pain, significant OA and reports that Rt knee "locks" when standing.  Pt demonstrates Rt knee genu valgus alignment, Rt hip weakness and crepitus/limited patellar mobility with patellar mobs.  FOTO is 34% limitaiton.  Pt negotiates steps with step-to gait pattern.  Pt will beneift from skilled PT for Rt hip and knee flexibility and strength and stair training to return to normalized pattern with negotiating steps.   Pt will benefit from skilled therapeutic intervention in order to improve on the following deficits Pain;Decreased strength;Decreased mobility;Decreased activity tolerance   Rehab Potential Good   PT Frequency 2x / week   PT Duration 8 weeks   PT Treatment/Interventions ADLs/Self Care Home Management;Electrical Stimulation;Moist Heat;Therapeutic exercise;Therapeutic activities;Functional mobility training;Stair training;Gait training;Neuromuscular re-education;Patient/family education;Manual techniques;Passive range of motion   PT Next Visit Plan Rt quad strength, hip strength and gait training on steps   Consulted and Agree with Plan of Care Patient          G-Codes - 09/25/2014 1600    Functional Assessment Tool Used FOTO: 34% limitation   Functional Limitation Mobility: Walking and moving around   Mobility: Walking and Moving Around Current Status (P8099) At least 20 percent but less than 40 percent impaired, limited or restricted   Mobility: Walking and Moving Around Goal Status (I3382) At least 20 percent but less than 40 percent impaired, limited or restricted       Problem List Patient Active Problem List   Diagnosis Date Noted  . Arthritis 02/24/2014  . HTN (hypertension) 02/24/2014  . Hyperlipidemia 02/24/2014  . Breast cancer, right, upper outer quadrant  07/05/2010    Samyia Motter, PT 09/25/2014, 4:45 PM  [facility] [address] [place], [place], [address] Phone: [phone]   Fax:   [phone]

## 2014-09-18 ENCOUNTER — Encounter: Payer: Self-pay | Admitting: Physical Therapy

## 2014-09-18 ENCOUNTER — Ambulatory Visit: Payer: Medicare Other | Admitting: Physical Therapy

## 2014-09-18 DIAGNOSIS — M25561 Pain in right knee: Secondary | ICD-10-CM | POA: Diagnosis not present

## 2014-09-18 DIAGNOSIS — R29898 Other symptoms and signs involving the musculoskeletal system: Secondary | ICD-10-CM

## 2014-09-18 NOTE — Therapy (Signed)
[facility] [address], [place], [place], [address] Phone: [phone]   Fax:  [phone]  Physical Therapy Treatment  Patient Details  Name: Yolanda Peters MRN: [id number] Date of Birth: [birth year] Referring Provider:  Eliezer Lofts, PA-C  Encounter Date: 09/18/2014      PT End of Session - 09/18/14 1557    Visit Number 2   Number of Visits 10   Date for PT Re-Evaluation 11/12/14   PT Start Time 3825   PT Stop Time 1615   PT Time Calculation (min) 46 min   Activity Tolerance Patient tolerated treatment well   Behavior During Therapy [facility] for tasks assessed/performed      Past Medical History  Diagnosis Date  . Arthritis   . Hypertension   . Allergy   . Cancer     right breast   . Hyperlipidemia     takes Niacin    Past Surgical History  Procedure Laterality Date  . Breast ductal system excision      right breast  . Elbow surgery      left  . Cataract extraction      bilateral  . Myomectomy    . Cesarean section    . Breast surgery  august 2011    right partial mastectomy    There were no vitals filed for this visit.  Visit Diagnosis:  Right knee pain  Right leg weakness      Subjective Assessment - 09/18/14 1539    Subjective No complain of pain at this time, but right leg is "locking" with prolonged standing. Both are bone on bone they have arthritis. Pt reports she walks stairs by step to pattern and wishes to change that.    Currently in Pain? No/denies                         [facility] Adult PT Treatment/Exercise - 09/18/14 0001    Ambulation/Gait   Stairs Yes   Stairs Assistance 7: Independent   Stair Management Technique Two rails;Alternating pattern;With cane  beginning of session step to pattern, after PT alternating p   Number of Stairs 20   Exercises   Exercises Knee/Hip   Knee/Hip Exercises: Stretches   Active Hamstring Stretch 3 reps;20 seconds  in  sitting   Knee/Hip Exercises: Aerobic   Nustep L1 x 8 min  St # 8, arms #8   Knee/Hip Exercises: Seated   Long Arc Quad Strengthening;20 reps;Weights  3# with 3 sec hold   Long Arc Quad Weight 3 lbs.   Knee/Hip Exercises: Supine   Straight Leg Raises Strengthening;Both;2 sets;10 reps   Knee/Hip Exercises: Sidelying   Hip ABduction Strengthening;Both;20 reps  on each side                PT Education - 09/17/14 1630    Education provided Yes   Education Details HEP: long arc quads, seated hamstring stretch, sidelying hip abduction, SLR   Person(s) Educated Patient   Methods Explanation;Demonstration;Handout   Comprehension Verbalized understanding;Returned demonstration          PT Short Term Goals - 09/17/14 1640    PT SHORT TERM GOAL #1   Title be independent in initial HEP   Time 4   Period Weeks   Status New   PT SHORT TERM GOAL #2   Title report a 25% reduction in episodes of Rt knee "locking" with standing activity   Time  4   Period Weeks   Status New   PT SHORT TERM GOAL #3   Title ascend and descend steps with step-over-step gait >or = to 25% of the time   Period Weeks   Status New           PT Long Term Goals - 10-15-2014 1641    PT LONG TERM GOAL #1   Title be independent in advanced HEP   Time 8   Period Weeks   Status New   PT LONG TERM GOAL #2   Title report a 60% reduction in Rt knee "locking" with standing activity   Time 8   Period Weeks   Status New   PT LONG TERM GOAL #3   Title ascend and descend steps with step-over-step gait > or = to 60%  of the time   Time 8   Period Weeks   Status New   PT LONG TERM GOAL #4   Title demonstrate 4+/5 Rt hip flexion and abduction strength to improve stability with walking   Time 8   Period Weeks   Status New   PT LONG TERM GOAL #5   Title report a 60% reduction in Rt knee pain with standing activity   Time 8   Period Weeks   Status New               Plan - 09/18/14 1559     Clinical Impression Statement Pt with complains of intermittend Rt knee pain, significant OA both knees and right knee "locks" when standing for longer period of time. Pt with crepitus and increased abd Rt tibia. Pt will benefit from skilled PT to improve flexibility and strength to return to normal pattern with negotiating stairs.   Pt will benefit from skilled therapeutic intervention in order to improve on the following deficits Pain;Decreased strength;Decreased mobility;Decreased activity tolerance   PT Frequency 2x / week   PT Duration 8 weeks   PT Treatment/Interventions ADLs/Self Care Home Management;Electrical Stimulation;Moist Heat;Therapeutic exercise;Therapeutic activities;Functional mobility training;Stair training;Gait training;Neuromuscular re-education;Patient/family education;Manual techniques;Passive range of motion   PT Next Visit Plan Continue with bil quadriceps strength with focus on Rt, hip strength and gait training on stairs   Consulted and Agree with Plan of Care Patient          G-Codes - 10-15-2014 1600    Functional Assessment Tool Used FOTO: 34% limitation   Functional Limitation Mobility: Walking and moving around   Mobility: Walking and Moving Around Current Status ([id number]) At least 20 percent but less than 40 percent impaired, limited or restricted   Mobility: Walking and Moving Around Goal Status [phone]) At least 20 percent but less than 40 percent impaired, limited or restricted      Problem List Patient Active Problem List   Diagnosis Date Noted  . Arthritis 02/24/2014  . HTN (hypertension) 02/24/2014  . Hyperlipidemia 02/24/2014  . Breast cancer, right, upper outer quadrant  07/05/2010    NAUMANN-HOUEGNIFIO,Nohealani Medinger PTA 09/18/2014, 4:15 PM  [facility] [address], [place], [place], [address] Phone: [phone]   Fax:  [phone]

## 2014-09-22 ENCOUNTER — Ambulatory Visit: Payer: Medicare Other

## 2014-09-22 DIAGNOSIS — M25561 Pain in right knee: Secondary | ICD-10-CM

## 2014-09-22 DIAGNOSIS — R29898 Other symptoms and signs involving the musculoskeletal system: Secondary | ICD-10-CM

## 2014-09-22 NOTE — Therapy (Signed)
[facility] [address], [place], [place], [address] Phone: [phone]   Fax:  [phone]  Physical Therapy Treatment  Patient Details  Name: Yolanda Peters MRN: [id number] Date of Birth: [birth year] Referring Provider:  Eliezer Lofts, PA-C  Encounter Date: 09/22/2014      PT End of Session - 09/22/14 1218    Visit Number 3   Number of Visits 10   Date for PT Re-Evaluation 11/12/14   PT Start Time 1147   PT Stop Time 1221   PT Time Calculation (min) 34 min   Activity Tolerance Patient tolerated treatment well   Behavior During Therapy [facility] for tasks assessed/performed      Past Medical History  Diagnosis Date  . Arthritis   . Hypertension   . Allergy   . Cancer     right breast   . Hyperlipidemia     takes Niacin    Past Surgical History  Procedure Laterality Date  . Breast ductal system excision      right breast  . Elbow surgery      left  . Cataract extraction      bilateral  . Myomectomy    . Cesarean section    . Breast surgery  august 2011    right partial mastectomy    There were no vitals filed for this visit.  Visit Diagnosis:  Right knee pain  Right leg weakness      Subjective Assessment - 09/22/14 1154    Subjective No locking of knees over the weekend.  No pain.   Currently in Pain? No/denies                         [facility] Adult PT Treatment/Exercise - 09/22/14 0001    Knee/Hip Exercises: Stretches   Active Hamstring Stretch 3 reps;20 seconds  in sitting, and supine with strap   Knee/Hip Exercises: Aerobic   Nustep L1 x 8 min  St # 8, arms #8   Knee/Hip Exercises: Standing   Hip Abduction Stengthening;Both;2 sets;10 reps   Hip Extension Stengthening;Both;2 sets;10 reps   Forward Step Up Both;2 sets;10 reps   Knee/Hip Exercises: Seated   Long Arc Quad Strengthening;20 reps;Weights  3# with 3 sec hold   Long Arc Quad Weight 3 lbs.   Knee/Hip  Exercises: Supine   Straight Leg Raises Strengthening;Both;2 sets;10 reps                  PT Short Term Goals - 09/22/14 1157    PT SHORT TERM GOAL #1   Title be independent in initial HEP   Period Weeks   Status On-going  independent in current HEP   PT SHORT TERM GOAL #2   Title report a 25% reduction in episodes of Rt knee "locking" with standing activity   Time 4   Period Weeks   Status On-going  no locking over the past 3 days   PT SHORT TERM GOAL #3   Title ascend and descend steps with step-over-step gait >or = to 25% of the time   Time 4   Period Weeks   Status On-going  more aware of this but not consistent           PT Long Term Goals - 09/22/14 1156    PT LONG TERM GOAL #1   Title be independent in advanced HEP   Time 8   Period Weeks   Status  On-going               Plan - 09/22/14 1205    Clinical Impression Statement Pt without episodes of Rt knee locking over the past 3 days and no pain with exercises at home or in the clinic.  Pt reports that she is using step-over-step gait with ascending and descending steps at home and is doing this more consitently.  Pt with improved quad control with straight leg raise today.  Pt will benefit from skilled PT to improve flexibilty, and strength to return to normal gait pattern with negotiating stairs.     Pt will benefit from skilled therapeutic intervention in order to improve on the following deficits Pain;Decreased strength;Decreased mobility;Decreased activity tolerance   Rehab Potential Good   PT Frequency 2x / week   PT Duration 8 weeks   PT Treatment/Interventions ADLs/Self Care Home Management;Electrical Stimulation;Moist Heat;Therapeutic exercise;Therapeutic activities;Functional mobility training;Stair training;Gait training;Neuromuscular re-education;Patient/family education;Manual techniques;Passive range of motion   PT Next Visit Plan Continue with bil quadriceps strength with focus on Rt,  hip strength and gait training on stairs.  Add standing hip exercises to HEP next session if tolerated well   Consulted and Agree with Plan of Care Patient        Problem List Patient Active Problem List   Diagnosis Date Noted  . Arthritis 02/24/2014  . HTN (hypertension) 02/24/2014  . Hyperlipidemia 02/24/2014  . Breast cancer, right, upper outer quadrant  07/05/2010    Adelaide Pfefferkorn, PT 09/22/2014, 12:22 PM  [facility] [address], [place], [place], [address] Phone: [phone]   Fax:  [phone]

## 2014-09-24 ENCOUNTER — Other Ambulatory Visit: Payer: Self-pay | Admitting: Obstetrics & Gynecology

## 2014-09-24 ENCOUNTER — Ambulatory Visit: Payer: Medicare Other | Admitting: Physical Therapy

## 2014-09-24 ENCOUNTER — Encounter: Payer: Self-pay | Admitting: Physical Therapy

## 2014-09-24 DIAGNOSIS — M25561 Pain in right knee: Secondary | ICD-10-CM

## 2014-09-24 DIAGNOSIS — R29898 Other symptoms and signs involving the musculoskeletal system: Secondary | ICD-10-CM

## 2014-09-24 NOTE — Therapy (Signed)
[facility] [address] [place], [place], [address] Phone: [phone]   Fax:  [phone]  Physical Therapy Treatment  Patient Details  Name: Yolanda Peters MRN: [id number] Date of Birth: 1940/10/20 Referring Provider:  Eliezer Lofts, PA-C  Encounter Date: 09/24/2014      PT End of Session - 09/24/14 1604    Visit Number 4   Number of Visits 10   Date for PT Re-Evaluation 11/12/14   PT Start Time 2706   PT Stop Time 1609   PT Time Calculation (min) 40 min   Activity Tolerance Patient tolerated treatment well   Behavior During Therapy [facility] for tasks assessed/performed      Past Medical History  Diagnosis Date  . Arthritis   . Hypertension   . Allergy   . Cancer     right breast   . Hyperlipidemia     takes Niacin    Past Surgical History  Procedure Laterality Date  . Breast ductal system excision      right breast  . Elbow surgery      left  . Cataract extraction      bilateral  . Myomectomy    . Cesarean section    . Breast surgery  august 2011    right partial mastectomy    There were no vitals filed for this visit.  Visit Diagnosis:  Right knee pain  Right leg weakness      Subjective Assessment - 09/24/14 1551    Subjective No complain of pain, feeling good   Limitations Standing   Currently in Pain? No/denies                         [facility] Adult PT Treatment/Exercise - 09/24/14 0001    Exercises   Exercises Knee/Hip   Knee/Hip Exercises: Stretches   Active Hamstring Stretch 3 reps;20 seconds  at stairs and in supine using strap   Knee/Hip Exercises: Aerobic   Nustep L1 x 8 min   Knee/Hip Exercises: Standing   Hip Abduction Stengthening;Both;2 sets;10 reps;Other (comment)  added 2# weights   Hip Extension Stengthening;Both;2 sets;10 reps;Other (comment)  2# added   Forward Step Up Both;2 sets;10 reps;Other (comment)  2# added   Knee/Hip Exercises:  Seated   Long Arc Quad Strengthening;20 reps;Weights;Other (comment)  3# added, 3 sec hold   Long Arc Quad Weight 3 lbs.   Knee/Hip Exercises: Supine   Straight Leg Raises Strengthening;Both;2 sets;10 reps                  PT Short Term Goals - 09/22/14 1157    PT SHORT TERM GOAL #1   Title be independent in initial HEP   Period Weeks   Status On-going  independent in current HEP   PT SHORT TERM GOAL #2   Title report a 25% reduction in episodes of Rt knee "locking" with standing activity   Time 4   Period Weeks   Status On-going  no locking over the past 3 days   PT SHORT TERM GOAL #3   Title ascend and descend steps with step-over-step gait >or = to 25% of the time   Time 4   Period Weeks   Status On-going  more aware of this but not consistent           PT Long Term Goals - 09/22/14 1156    PT LONG TERM GOAL #1   Title  be independent in advanced HEP   Time 8   Period Weeks   Status On-going               Plan - 09/24/14 1605    Clinical Impression Statement Pt feels good with Right knee no locking noticed lately.   Pt will benefit from skilled therapeutic intervention in order to improve on the following deficits Pain;Decreased strength;Decreased mobility;Decreased activity tolerance   Rehab Potential Good   PT Frequency 2x / week   PT Duration 8 weeks   PT Treatment/Interventions ADLs/Self Care Home Management;Electrical Stimulation;Moist Heat;Therapeutic exercise;Therapeutic activities;Functional mobility training;Stair training;Gait training;Neuromuscular re-education;Patient/family education;Manual techniques;Passive range of motion   PT Next Visit Plan Continue with bil quadriceps strength with focus on Rt, hip strength and gait training on stairs.  Add standing hip exercises to HEP next session if tolerated well   Consulted and Agree with Plan of Care Patient        Problem List Patient Active Problem List   Diagnosis Date Noted  .  Arthritis 02/24/2014  . HTN (hypertension) 02/24/2014  . Hyperlipidemia 02/24/2014  . Breast cancer, right, upper outer quadrant  07/05/2010    NAUMANN-HOUEGNIFIO,Zuleyma Scharf PTA 09/24/2014, 5:26 PM  [facility] [address] [place], [place], [address] Phone: [phone]   Fax:  [phone]

## 2014-09-25 LAB — CYTOLOGY - PAP

## 2014-09-29 ENCOUNTER — Ambulatory Visit: Payer: Medicare Other

## 2014-09-29 DIAGNOSIS — R29898 Other symptoms and signs involving the musculoskeletal system: Secondary | ICD-10-CM

## 2014-09-29 DIAGNOSIS — M25561 Pain in right knee: Secondary | ICD-10-CM

## 2014-09-29 NOTE — Therapy (Signed)
[facility] [address], [place], [place], [address] Phone: [phone]   Fax:  [phone]  Physical Therapy Treatment  Patient Details  Name: Yolanda Peters MRN: [id number] Date of Birth: 1940/08/27 Referring Provider:  Eliezer Lofts, PA-C  Encounter Date: 09/29/2014      PT End of Session - 09/29/14 1306    Visit Number 5   Number of Visits 10   Date for PT Re-Evaluation 11/12/14   PT Start Time 4332   PT Stop Time 1306   PT Time Calculation (min) 31 min   Activity Tolerance Patient tolerated treatment well   Behavior During Therapy [facility] for tasks assessed/performed      Past Medical History  Diagnosis Date  . Arthritis   . Hypertension   . Allergy   . Cancer     right breast   . Hyperlipidemia     takes Niacin    Past Surgical History  Procedure Laterality Date  . Breast ductal system excision      right breast  . Elbow surgery      left  . Cataract extraction      bilateral  . Myomectomy    . Cesarean section    . Breast surgery  august 2011    right partial mastectomy    There were no vitals filed for this visit.  Visit Diagnosis:  Right knee pain  Right leg weakness      Subjective Assessment - 09/29/14 1235    Subjective Pt reports that no pain, just continued "locking" of knee at times.     Currently in Pain? No/denies                         [facility] Adult PT Treatment/Exercise - 09/29/14 0001    Knee/Hip Exercises: Aerobic   Nustep L2 x 8 min  seat 8, arms 10   Knee/Hip Exercises: Standing   Hip Abduction Stengthening;Both;2 sets;10 reps;Other (comment)  added 2# weights   Hip Extension Stengthening;Both;2 sets;10 reps;Other (comment)  2# added   Forward Step Up Both;2 sets;10 reps;Step Height: 8"  2# added   Rebounder weight shifting 3 ways x1 min each   Walking with Sports Cord 25# forward x 10    Knee/Hip Exercises: Seated   Long Arc Quad  Strengthening;20 reps;Weights;Other (comment)  3# added, 3 sec hold   Long Arc Quad Weight 3 lbs.                  PT Short Term Goals - 09/29/14 1238    PT SHORT TERM GOAL #1   Title be independent in initial HEP   Status Achieved   PT SHORT TERM GOAL #2   Title report a 25% reduction in episodes of Rt knee "locking" with standing activity   Time 4   Period Weeks   Status On-going  Pt denies any change in frequency   PT SHORT TERM GOAL #3   Title ascend and descend steps with step-over-step gait >or = to 25% of the time   Time 4   Period Weeks   Status On-going  ascending 75% of the time, descending at least 25%           PT Long Term Goals - 09/22/14 1156    PT LONG TERM GOAL #1   Title be independent in advanced HEP   Time 8   Period Weeks   Status On-going  Plan - 09/29/14 1238    Clinical Impression Statement Pt reports no significant change in frequency of Rt knee locking.  Pt reports that she is ascending steps with step-over-step gait at least 75% of the time and is noticing Rt hip discomfort since this is new. Pt tolerated increased step height on step-ups today and increased resistance on NuStep.  Pt will continue to benefit from skilled PT for strength, proprioception and endurance progression.     Pt will benefit from skilled therapeutic intervention in order to improve on the following deficits Pain;Decreased strength;Decreased mobility;Decreased activity tolerance   Rehab Potential Good   PT Frequency 2x / week   PT Duration 8 weeks   PT Treatment/Interventions ADLs/Self Care Home Management;Electrical Stimulation;Moist Heat;Therapeutic exercise;Therapeutic activities;Functional mobility training;Stair training;Gait training;Neuromuscular re-education;Patient/family education;Manual techniques;Passive range of motion   PT Next Visit Plan Continue with bil quadriceps strength with focus on Rt, hip strength and gait training on  stairs.  Add standing hip exercises to HEP next session.        Problem List Patient Active Problem List   Diagnosis Date Noted  . Arthritis 02/24/2014  . HTN (hypertension) 02/24/2014  . Hyperlipidemia 02/24/2014  . Breast cancer, right, upper outer quadrant  07/05/2010    TAKACS,KELLY, PT 09/29/2014, 1:07 PM  [facility] [address], [place], [place], [address] Phone: [phone]   Fax:  [phone]

## 2014-09-30 ENCOUNTER — Ambulatory Visit
Admission: RE | Admit: 2014-09-30 | Discharge: 2014-09-30 | Disposition: A | Discharge status: Home / Self Care | Payer: Medicare Other | Source: Ambulatory Visit | Attending: Oncology | Admitting: Oncology

## 2014-09-30 DIAGNOSIS — Z853 Personal history of malignant neoplasm of breast: Secondary | ICD-10-CM

## 2014-10-02 ENCOUNTER — Encounter: Payer: Self-pay | Admitting: Physical Therapy

## 2014-10-02 ENCOUNTER — Ambulatory Visit: Payer: Medicare Other | Admitting: Physical Therapy

## 2014-10-02 DIAGNOSIS — M25561 Pain in right knee: Secondary | ICD-10-CM | POA: Diagnosis not present

## 2014-10-02 DIAGNOSIS — R29898 Other symptoms and signs involving the musculoskeletal system: Secondary | ICD-10-CM

## 2014-10-02 NOTE — Patient Instructions (Signed)
Knee High   Holding stable object, raise knee to hip level, then lower knee. Repeat with other knee. Complete __10_ repetitions. Do __2__ sessions per day.  ABDUCTION: Standing (Active)   Stand, feet flat. Lift right leg out to side.  Complete __10_ repetitions. Perform __2_ sessions per day. Repeat with other leg     EXTENSION: Standing (Active)  Stand, both feet flat. Draw right leg behind body as far as possible.  Complete 10 repetitions. Perform __2_ sessions per day. Repeat with other leg   Copyright  VHI. All rights reserved.

## 2014-10-02 NOTE — Therapy (Signed)
[facility] [address] [place], [place], [address] Phone: [phone]   Fax:  [phone]  Physical Therapy Treatment  Patient Details  Name: Yolanda Peters MRN: [id number] Date of Birth: 1940/04/20 Referring Provider:  Shon Baton, MD  Encounter Date: 10/02/2014      PT End of Session - 10/02/14 1211    Visit Number 6   Number of Visits 10   Date for PT Re-Evaluation 11/12/14   PT Start Time 1146   PT Stop Time 1230   PT Time Calculation (min) 44 min   Activity Tolerance Patient tolerated treatment well   Behavior During Therapy [facility] for tasks assessed/performed      Past Medical History  Diagnosis Date  . Arthritis   . Hypertension   . Allergy   . Cancer     right breast   . Hyperlipidemia     takes Niacin    Past Surgical History  Procedure Laterality Date  . Breast ductal system excision      right breast  . Elbow surgery      left  . Cataract extraction      bilateral  . Myomectomy    . Cesarean section    . Breast surgery  august 2011    right partial mastectomy    There were no vitals filed for this visit.  Visit Diagnosis:  Right knee pain  Right leg weakness      Subjective Assessment - 10/02/14 1157    Subjective Pt reports no pain, just contiues to "locking" yesterday it happened only one time   Currently in Pain? No/denies                         [facility] Adult PT Treatment/Exercise - 10/02/14 0001    Exercises   Exercises Knee/Hip   Knee/Hip Exercises: Stretches   Active Hamstring Stretch --   Knee/Hip Exercises: Aerobic   Nustep L2 x 8 min  seat #8, arms # 10, 0.68miles in 61min   Knee/Hip Exercises: Standing   Hip Abduction Stengthening;Both;2 sets;10 reps;Other (comment)  3# added   Hip Extension Stengthening;Both;2 sets;10 reps;Other (comment)  3# added   Forward Step Up Both;2 sets;10 reps;Step Height: 8"  3# added on 8" step   Rebounder weight  shifting 3 ways x1 min each   Walking with Sports Cord 25# forward/reverse & backward/reverse x 10 each   Knee/Hip Exercises: Seated   Long Arc Quad Strengthening;20 reps;Weights;Other (comment)  3# added sitting elevated on one pillow   Long Arc Quad Weight 3 lbs.                PT Education - 10/02/14 1224    Education Details Standing hip abd, extension, marching   Person(s) Educated Patient   Methods Explanation;Demonstration;Handout   Comprehension Verbalized understanding;Returned demonstration          PT Short Term Goals - 09/29/14 1238    PT SHORT TERM GOAL #1   Title be independent in initial HEP   Status Achieved   PT SHORT TERM GOAL #2   Title report a 25% reduction in episodes of Rt knee "locking" with standing activity   Time 4   Period Weeks   Status On-going  Pt denies any change in frequency   PT SHORT TERM GOAL #3   Title ascend and descend steps with step-over-step gait >or = to 25% of the time   Time  4   Period Weeks   Status On-going  ascending 75% of the time, descending at least 25%           PT Long Term Goals - 09/22/14 1156    PT LONG TERM GOAL #1   Title be independent in advanced HEP   Time 8   Period Weeks   Status On-going               Plan - 10/02/14 1212    Clinical Impression Statement Pt reports locking up of knee happens only once yesterday, seems to get better. pt was able to tolerate incr in weight with exercises and able to step up 8"box. with 3# added.  Pt will continue to benefit from skilled PT for strength and andurance.   Pt will benefit from skilled therapeutic intervention in order to improve on the following deficits Pain;Decreased strength;Decreased mobility;Decreased activity tolerance   Rehab Potential Good   PT Frequency 2x / week   PT Duration 8 weeks   PT Treatment/Interventions ADLs/Self Care Home Management;Electrical Stimulation;Moist Heat;Therapeutic exercise;Therapeutic  activities;Functional mobility training;Stair training;Gait training;Neuromuscular re-education;Patient/family education;Manual techniques;Passive range of motion   PT Next Visit Plan Continue with Bil LE strength with focus on quadriceps and Rt hip, may do gait training on stairs.   Consulted and Agree with Plan of Care Patient        Problem List Patient Active Problem List   Diagnosis Date Noted  . Arthritis 02/24/2014  . HTN (hypertension) 02/24/2014  . Hyperlipidemia 02/24/2014  . Breast cancer, right, upper outer quadrant  07/05/2010    NAUMANN-HOUEGNIFIO,Elba Schaber PTA 10/02/2014, 12:29 PM  [facility] [address] [place], [place], [address] Phone: [phone]   Fax:  [phone]

## 2014-10-06 ENCOUNTER — Encounter: Payer: Self-pay | Admitting: Physical Therapy

## 2014-10-06 ENCOUNTER — Ambulatory Visit: Payer: Medicare Other | Admitting: Physical Therapy

## 2014-10-06 DIAGNOSIS — R29898 Other symptoms and signs involving the musculoskeletal system: Secondary | ICD-10-CM

## 2014-10-06 DIAGNOSIS — M25561 Pain in right knee: Secondary | ICD-10-CM | POA: Diagnosis not present

## 2014-10-06 NOTE — Therapy (Signed)
[facility] [address], [place], [place], [address] Phone: [phone]   Fax:  [phone]  Physical Therapy Treatment  Patient Details  Name: Yolanda Peters MRN: [id number] Date of Birth: Feb 06, 1940 Referring Provider:  Eliezer Lofts, PA-C  Encounter Date: 10/06/2014      PT End of Session - 10/06/14 1228    Visit Number 7   Date for PT Re-Evaluation 11/12/14   PT Start Time 1130   PT Stop Time 1230   PT Time Calculation (min) 60 min   Activity Tolerance Patient tolerated treatment well   Behavior During Therapy [facility] for tasks assessed/performed      Past Medical History  Diagnosis Date  . Arthritis   . Hypertension   . Allergy   . Cancer     right breast   . Hyperlipidemia     takes Niacin    Past Surgical History  Procedure Laterality Date  . Breast ductal system excision      right breast  . Elbow surgery      left  . Cataract extraction      bilateral  . Myomectomy    . Cesarean section    . Breast surgery  august 2011    right partial mastectomy    There were no vitals filed for this visit.  Visit Diagnosis:  Right knee pain  Right leg weakness      Subjective Assessment - 10/06/14 1220    Subjective Pt has no complain of pain, reports negotiating stairs at home is getting easier.   Currently in Pain? No/denies                         [facility] Adult PT Treatment/Exercise - 10/06/14 0001    Ambulation/Gait   Stairs Yes   Stairs Assistance 7: Independent   Stair Management Technique One rail Left  with desending on 6" step   Number of Stairs 24   Gait Comments pt stairs at home are 8" high where it is more difficult   Exercises   Exercises Knee/Hip   Knee/Hip Exercises: Aerobic   Nustep L2 x 8 min  seat #8, arms# 10 not using Rt UE today due to pain in Rt   Knee/Hip Exercises: Standing   Lateral Step Up Both;2 sets;Hand Hold: 1;Step Height: 8"   Forward  Step Up Both;2 sets;10 reps;Step Height: 8"  3#added    Walking with Sports Cord 25# forward/reverse & backward/reverds x15 each   Knee/Hip Exercises: Seated   Long Arc Quad Strengthening;20 reps;Weights;Other (comment)  3#added sitting on pillow for elevation   Long Arc Quad Weight 3 lbs.   Knee/Hip Exercises: Sidelying   Hip ABduction Strengthening;Both;2 sets;15 reps   Hip ADduction Strengthening;2 sets;15 reps   Other Sidelying Knee/Hip Exercises PNF pattern with Rt LE into flex/ext                  PT Short Term Goals - 10/06/14 1231    PT SHORT TERM GOAL #1   Title be independent in initial HEP   Time 4   Period Weeks   Status Achieved   PT SHORT TERM GOAL #2   Title report a 25% reduction in episodes of Rt knee "locking" with standing activity   Time 4   Period Weeks   Status On-going   PT SHORT TERM GOAL #3   Title ascend and descend steps with step-over-step gait >or =  to 25% of the time   Time 4   Period Weeks   Status On-going           PT Long Term Goals - 09/22/14 1156    PT LONG TERM GOAL #1   Title be independent in advanced HEP   Time 8   Period Weeks   Status On-going               Plan - 10/06/14 1229    Clinical Impression Statement Pt with improved confidence with negotiating stairs. Pt with good performance on 8" step with stepping up forward and sidestepping. Pt will continue to benefit from skilled PT.   Pt will benefit from skilled therapeutic intervention in order to improve on the following deficits Pain;Decreased strength;Decreased mobility;Decreased activity tolerance   Rehab Potential Good   PT Frequency 2x / week   PT Duration 8 weeks   PT Treatment/Interventions ADLs/Self Care Home Management;Electrical Stimulation;Moist Heat;Therapeutic exercise;Therapeutic activities;Functional mobility training;Stair training;Gait training;Neuromuscular re-education;Patient/family education;Manual techniques;Passive range of motion    PT Next Visit Plan Continue with Bil LE strength with focus on quadriceps and Rt hip, do gait training on stairs.   Consulted and Agree with Plan of Care Patient        Problem List Patient Active Problem List   Diagnosis Date Noted  . Arthritis 02/24/2014  . HTN (hypertension) 02/24/2014  . Hyperlipidemia 02/24/2014  . Breast cancer, right, upper outer quadrant  07/05/2010    NAUMANN-HOUEGNIFIO,Stephano Arrants PTA 10/06/2014, 12:32 PM  [facility] [address] [place], [place], [address] Phone: [phone]   Fax:  [phone]

## 2014-10-08 ENCOUNTER — Ambulatory Visit: Payer: Medicare Other

## 2014-10-08 DIAGNOSIS — M25561 Pain in right knee: Secondary | ICD-10-CM | POA: Diagnosis not present

## 2014-10-08 DIAGNOSIS — R29898 Other symptoms and signs involving the musculoskeletal system: Secondary | ICD-10-CM

## 2014-10-08 NOTE — Therapy (Signed)
[facility] [address] [place], [place], [address] Phone: [phone]   Fax:  [phone]  Physical Therapy Treatment  Patient Details  Name: Yolanda Peters MRN: [id number] Date of Birth: 10/29/1940 Referring Provider:  Eliezer Lofts, PA-C  Encounter Date: 10/08/2014      PT End of Session - 10/08/14 1220    Visit Number 8   Number of Visits 10   Date for PT Re-Evaluation 11/12/14   PT Start Time 1150   PT Stop Time 1228   PT Time Calculation (min) 38 min   Activity Tolerance Patient tolerated treatment well   Behavior During Therapy [facility] for tasks assessed/performed      Past Medical History  Diagnosis Date  . Arthritis   . Hypertension   . Allergy   . Cancer     right breast   . Hyperlipidemia     takes Niacin    Past Surgical History  Procedure Laterality Date  . Breast ductal system excision      right breast  . Elbow surgery      left  . Cataract extraction      bilateral  . Myomectomy    . Cesarean section    . Breast surgery  august 2011    right partial mastectomy    There were no vitals filed for this visit.  Visit Diagnosis:  Right knee pain  Right leg weakness      Subjective Assessment - 10/08/14 1154    Subjective Pt denies any pain.  Pt reports that it is locking a little bit less but unable to determine the difference.  Went up and down the steps 10 times yesterday.   Currently in Pain? No/denies                         [facility] Adult PT Treatment/Exercise - 10/08/14 0001    Knee/Hip Exercises: Aerobic   Recumbent Bike Level 2x 8 minutes   Nustep --   Knee/Hip Exercises: Standing   Hip Abduction Stengthening;Both;2 sets;10 reps;Other (comment)  3# added   Hip Extension Stengthening;Both;2 sets;10 reps;Other (comment)  3# added   Lateral Step Up Both;2 sets;Hand Hold: 1;Step Height: 8"   Forward Step Up Both;2 sets;10 reps;Step Height: 8"   Rebounder weight shifting 3 ways x1 min each   Walking with Sports Cord 25# forward/reverse & backward/reverds x10 each, 20# sidestepping                   PT Short Term Goals - 10/06/14 1231    PT SHORT TERM GOAL #1   Title be independent in initial HEP   Time 4   Period Weeks   Status Achieved   PT SHORT TERM GOAL #2   Title report a 25% reduction in episodes of Rt knee "locking" with standing activity   Time 4   Period Weeks   Status On-going   PT SHORT TERM GOAL #3   Title ascend and descend steps with step-over-step gait >or = to 25% of the time   Time 4   Period Weeks   Status On-going           PT Long Term Goals - 09/22/14 1156    PT LONG TERM GOAL #1   Title be independent in advanced HEP   Time 8   Period Weeks   Status On-going  Plan - 10/08/14 1157    Clinical Impression Statement Pt denies any significant change in Lt knee locking with activity.  Pt able to perform 8" step-ups without difficulty.  Pt without epidose of locking with clnic activity today.  Pt will benefit from continued strengthening to reduce instabiltiy.   Pt will benefit from skilled therapeutic intervention in order to improve on the following deficits Pain;Decreased strength;Decreased mobility;Decreased activity tolerance   Rehab Potential Good   PT Frequency 2x / week   PT Duration 8 weeks   PT Treatment/Interventions ADLs/Self Care Home Management;Electrical Stimulation;Moist Heat;Therapeutic exercise;Therapeutic activities;Functional mobility training;Stair training;Gait training;Neuromuscular re-education;Patient/family education;Manual techniques;Passive range of motion   PT Next Visit Plan Continue with Bil LE strength with focus on quadriceps and Rt hip   Consulted and Agree with Plan of Care Patient        Problem List Patient Active Problem List   Diagnosis Date Noted  . Arthritis 02/24/2014  . HTN (hypertension) 02/24/2014  . Hyperlipidemia  02/24/2014  . Breast cancer, right, upper outer quadrant  07/05/2010    Ameir Faria, PT 10/08/2014, 12:21 PM  [facility] [address] [place], [place], [address] Phone: [phone]   Fax:  [phone]

## 2014-10-13 ENCOUNTER — Ambulatory Visit: Payer: Medicare Other | Attending: Rheumatology

## 2014-10-13 DIAGNOSIS — M25561 Pain in right knee: Secondary | ICD-10-CM | POA: Diagnosis present

## 2014-10-13 DIAGNOSIS — R29898 Other symptoms and signs involving the musculoskeletal system: Secondary | ICD-10-CM

## 2014-10-13 NOTE — Therapy (Signed)
[facility] [address], [place], [place], [address] Phone: [phone]   Fax:  [phone]  Physical Therapy Treatment  Patient Details  Name: Yolanda Peters MRN: [id number] Date of Birth: Mar 07, 1940 Referring Provider:  Eliezer Lofts, PA-C  Encounter Date: 10/13/2014      PT End of Session - 10/13/14 1051    Visit Number 9   Number of Visits 19   Date for PT Re-Evaluation 11/12/14   PT Start Time 1013   PT Stop Time 1053   PT Time Calculation (min) 40 min   Activity Tolerance Patient tolerated treatment well   Behavior During Therapy [name] for tasks assessed/performed      Past Medical History  Diagnosis Date  . Arthritis   . Hypertension   . Allergy   . Cancer     right breast   . Hyperlipidemia     takes Niacin    Past Surgical History  Procedure Laterality Date  . Breast ductal system excision      right breast  . Elbow surgery      left  . Cataract extraction      bilateral  . Myomectomy    . Cesarean section    . Breast surgery  august 2011    right partial mastectomy    There were no vitals filed for this visit.  Visit Diagnosis:  Right knee pain  Right leg weakness      Subjective Assessment - 10/13/14 1017    Subjective Pt denies any pain.     Currently in Pain? No/denies            [facility] PT Assessment - 10/13/14 0001    Assessment   Medical Diagnosis Rt knee OA and pain (knee locks at times)   Onset Date/Surgical Date 09/16/12   Precautions   Precautions Other (comment)   Prior Function   Level of Independence Independent   Vocation Retired   Leisure deep water aerobis 3x/wk, gym exercise 2x/wk   Cognition   Overall Cognitive Status Within Functional Limits for tasks assessed   Observation/Other Assessments   Focus on Therapeutic Outcomes (FOTO)  30% limitation   Strength   Overall Strength Deficits   Overall Strength Comments Rt SLR 4/5, abduction 4/5, knee  4+/5 throughout                     [facility] Adult PT Treatment/Exercise - 10/13/14 0001    Knee/Hip Exercises: Aerobic   Recumbent Bike --   Nustep L2 x 10 min  seat #8, arms# 10 not using Rt UE today due to pain in Rt   Knee/Hip Exercises: Standing   Hip Abduction Stengthening;Both;2 sets;10 reps;Other (comment)  3# added   Hip Extension Stengthening;Both;2 sets;10 reps;Other (comment)  3# added   Lateral Step Up Both;2 sets;Hand Hold: 1;Step Height: 8"   Forward Step Up Both;2 sets;10 reps;Step Height: 8"   Rebounder weight shifting 3 ways x1 min each   Walking with Sports Cord 25# forward/reverse & backward/reverds x10 each, 20# sidestepping    Knee/Hip Exercises: Seated   Long Arc Quad Strengthening;20 reps;Weights;Other (comment)  3#added sitting on pillow for elevation   Long Arc Quad Weight 3 lbs.                  PT Short Term Goals - 10/13/14 1024    PT SHORT TERM GOAL #2   Title report a 25% reduction in episodes of  Rt knee "locking" with standing activity   Time 4   Period Weeks   Status On-going   PT SHORT TERM GOAL #3   Title ascend and descend steps with step-over-step gait >or = to 25% of the time   Status Achieved           PT Long Term Goals - Oct 23, 2014 1024    PT LONG TERM GOAL #1   Title be independent in advanced HEP   Time 8   Period Weeks   Status On-going   PT LONG TERM GOAL #2   Title report a 60% reduction in Rt knee "locking" with standing activity   Time 8   Period Weeks   Status On-going   PT LONG TERM GOAL #3   Title ascend and descend steps with step-over-step gait > or = to 60%  of the time   Status Achieved   PT LONG TERM GOAL #4   Title demonstrate 4+/5 Rt hip flexion and abduction strength to improve stability with walking   Time 8   Period Weeks   Status On-going   PT LONG TERM GOAL #5   Title report a 60% reduction in Rt knee pain with standing activity   Time 8   Period Weeks   Status On-going                Plan - 2014/10/23 1036    Clinical Impression Statement Pt without significant change in episodes of Rt knee locking with activity.  Pt is able to ascend and descend steps with step-over-step gait >75% of the time at home.  FOTO score is improved to 30% limitation and pt continues to demonstrate weakness in Rt knee and hip.  Pt will benefit from skilled PT for strength progression to improve safety.     Pt will benefit from skilled therapeutic intervention in order to improve on the following deficits Pain;Decreased strength;Decreased mobility;Decreased activity tolerance   PT Frequency 2x / week   PT Duration 8 weeks   PT Treatment/Interventions ADLs/Self Care Home Management;Electrical Stimulation;Moist Heat;Therapeutic exercise;Therapeutic activities;Functional mobility training;Stair training;Gait training;Neuromuscular re-education;Patient/family education;Manual techniques;Passive range of motion   PT Next Visit Plan Continue with Bil LE strength with focus on quadriceps and Rt hip   Consulted and Agree with Plan of Care Patient          G-Codes - Oct 23, 2014 1032    Functional Assessment Tool Used FOTO: 30% limitaiton   Functional Limitation Mobility: Walking and moving around   Mobility: Walking and Moving Around Current Status [phone]) At least 20 percent but less than 40 percent impaired, limited or restricted   Mobility: Walking and Moving Around Goal Status [phone]) At least 20 percent but less than 40 percent impaired, limited or restricted      Problem List Patient Active Problem List   Diagnosis Date Noted  . Arthritis 02/24/2014  . HTN (hypertension) 02/24/2014  . Hyperlipidemia 02/24/2014  . Breast cancer, right, upper outer quadrant  07/05/2010    Physical Therapy Progress Note  Dates of Reporting Period: 09/17/14 to Oct 23, 2014  Objective Reports of Subjective Statement: Pt able to ascend and descend steps with step-over-step gait.    Objective  Measurements: see above  Goal Update: See above   Plan: Hip and knee strength, stability exercise for Rt hip and knee.  Reason Skilled Services are Required: Pt with continued hip and knee weakness and episodes of Rt knee locking with gait.  See above.     Ojani Berenson ,  PT  10/13/2014, 10:53 AM  [facility] [address] [place], [place], [address] Phone: [phone]   Fax:  [phone]

## 2014-10-15 ENCOUNTER — Encounter: Payer: Self-pay | Admitting: Physical Therapy

## 2014-10-15 ENCOUNTER — Ambulatory Visit: Payer: Medicare Other | Admitting: Physical Therapy

## 2014-10-15 DIAGNOSIS — M25561 Pain in right knee: Secondary | ICD-10-CM

## 2014-10-15 DIAGNOSIS — R29898 Other symptoms and signs involving the musculoskeletal system: Secondary | ICD-10-CM

## 2014-10-15 NOTE — Therapy (Signed)
[facility] [address], [place], [place], [address] Phone: [phone]   Fax:  [phone]  Physical Therapy Treatment  Patient Details  Name: Yolanda Peters MRN: [id number] Date of Birth: 02-02-1940 Referring Provider:  Eliezer Lofts, PA-C  Encounter Date: 10/15/2014      PT End of Session - 10/15/14 1034    Visit Number 10   Number of Visits 19   Date for PT Re-Evaluation 11/12/14   PT Start Time 1010   PT Stop Time 1057   PT Time Calculation (min) 47 min   Activity Tolerance Patient tolerated treatment well   Behavior During Therapy [facility] for tasks assessed/performed      Past Medical History  Diagnosis Date  . Arthritis   . Hypertension   . Allergy   . Cancer [facility])     right breast   . Hyperlipidemia     takes Niacin    Past Surgical History  Procedure Laterality Date  . Breast ductal system excision      right breast  . Elbow surgery      left  . Cataract extraction      bilateral  . Myomectomy    . Cesarean section    . Breast surgery  august 2011    right partial mastectomy    There were no vitals filed for this visit.  Visit Diagnosis:  Right knee pain  Right leg weakness      Subjective Assessment - 10/15/14 1027    Subjective No complain of pain, she has been at water aerobic this morning   Currently in Pain? No/denies                         [facility] Adult PT Treatment/Exercise - 10/15/14 0001    Ambulation/Gait   Stairs Yes   Stairs Assistance 7: Independent   Stair Management Technique Two rails   Number of Stairs 8   Gait Comments step over step, but stairs at home [place] were it is more difficult.   Exercises   Exercises Knee/Hip;Ankle   Knee/Hip Exercises: Aerobic   Nustep L2 x 10 min  seat #8, arms #10   Knee/Hip Exercises: Standing   Hip Abduction Stengthening;Both;2 sets;10 reps;Other (comment)  3#added   Hip Extension Stengthening;Both;2  sets;10 reps;Other (comment)  3#added   Lateral Step Up Both;2 sets;Hand Hold: 1;Step Height: 8"  3# added   Forward Step Up Both;2 sets;10 reps;Step Height: 8"  3# added   Rebounder weight shifting 3 ways x1 min each   Walking with Sports Cord 30# forward/reverse & backward/reverds x10 each, 25# sidestepping    Other Standing Knee Exercises modified squatting with red pysioball 3 x 10   Ankle Exercises: Seated   Heel Raises 10 reps;5 seconds  5sec slow decent                  PT Short Term Goals - 10/13/14 1024    PT SHORT TERM GOAL #2   Title report a 25% reduction in episodes of Rt knee "locking" with standing activity   Time 4   Period Weeks   Status On-going   PT SHORT TERM GOAL #3   Title ascend and descend steps with step-over-step gait >or = to 25% of the time   Status Achieved           PT Long Term Goals - 10/13/14 1024    PT LONG TERM  GOAL #1   Title be independent in advanced HEP   Time 8   Period Weeks   Status On-going   PT LONG TERM GOAL #2   Title report a 60% reduction in Rt knee "locking" with standing activity   Time 8   Period Weeks   Status On-going   PT LONG TERM GOAL #3   Title ascend and descend steps with step-over-step gait > or = to 60%  of the time   Status Achieved   PT LONG TERM GOAL #4   Title demonstrate 4+/5 Rt hip flexion and abduction strength to improve stability with walking   Time 8   Period Weeks   Status On-going   PT LONG TERM GOAL #5   Title report a 60% reduction in Rt knee pain with standing activity   Time 8   Period Weeks   Status On-going               Plan - 10/15/14 1036    Clinical Impression Statement Pt reports locking of Rt knee is less than before. Pt continues to benefit from skilled PT  to improve strength.    Rehab Potential Good   PT Frequency 2x / week   PT Duration 8 weeks   PT Treatment/Interventions ADLs/Self Care Home Management;Electrical Stimulation;Moist Heat;Therapeutic  exercise;Therapeutic activities;Functional mobility training;Stair training;Gait training;Neuromuscular re-education;Patient/family education;Manual techniques;Passive range of motion   PT Next Visit Plan Continue with Bil LE strength with focus on quadriceps and Rt hip   Consulted and Agree with Plan of Care Patient        Problem List Patient Active Problem List   Diagnosis Date Noted  . Arthritis 02/24/2014  . HTN (hypertension) 02/24/2014  . Hyperlipidemia 02/24/2014  . Breast cancer, right, upper outer quadrant  07/05/2010    NAUMANN-HOUEGNIFIO,Quinterious Walraven PTA 10/15/2014, 10:53 AM  [facility] [address] [place], [place], [address] Phone: [phone]   Fax:  [phone]

## 2014-10-20 ENCOUNTER — Encounter: Payer: Self-pay | Admitting: Physical Therapy

## 2014-10-20 ENCOUNTER — Ambulatory Visit: Payer: Medicare Other | Admitting: Physical Therapy

## 2014-10-20 DIAGNOSIS — M25561 Pain in right knee: Secondary | ICD-10-CM

## 2014-10-20 DIAGNOSIS — R29898 Other symptoms and signs involving the musculoskeletal system: Secondary | ICD-10-CM

## 2014-10-20 NOTE — Therapy (Signed)
[facility] [address] [place], [place], [address] Phone: [phone]   Fax:  [phone]  Physical Therapy Treatment  Patient Details  Name: Yolanda Peters MRN: [id number] Date of Birth: May 24, 1940 Referring Provider:  Eliezer Lofts, PA-C  Encounter Date: 10/20/2014      PT End of Session - 10/20/14 1035    Visit Number 11   Number of Visits 19   Date for PT Re-Evaluation 11/12/14   PT Start Time 1016   PT Stop Time 1100   PT Time Calculation (min) 44 min   Activity Tolerance Patient tolerated treatment well   Behavior During Therapy [facility] for tasks assessed/performed      Past Medical History  Diagnosis Date  . Arthritis   . Hypertension   . Allergy   . Cancer [facility])     right breast   . Hyperlipidemia     takes Niacin    Past Surgical History  Procedure Laterality Date  . Breast ductal system excision      right breast  . Elbow surgery      left  . Cataract extraction      bilateral  . Myomectomy    . Cesarean section    . Breast surgery  august 2011    right partial mastectomy    There were no vitals filed for this visit.  Visit Diagnosis:  Right knee pain  Right leg weakness                       [facility] Adult PT Treatment/Exercise - 10/20/14 0001    Ambulation/Gait   Stairs Yes   Stairs Assistance 7: Independent   Stair Management Technique One rail Left   Number of Stairs 24   Gait Comments step over step, but stairs at home [place] were it is more difficult.  descending more challenging, due to weakness    Exercises   Exercises Knee/Hip;Ankle   Knee/Hip Exercises: Stretches   Sports administrator Both;3 reps  standing at stairs   Knee/Hip Exercises: Aerobic   Nustep L2 x 10 min  seat#8, arms#10    Knee/Hip Exercises: Standing   Hip Abduction Stengthening;Both;2 sets;10 reps;Other (comment)   Hip Extension Stengthening;Both;2 sets;10 reps;Other (comment)   Lateral  Step Up Both;2 sets;Hand Hold: 1;Step Height: 8"  3#added   Forward Step Up Both;2 sets;10 reps;Step Height: 8"  3#added   Rebounder weight shifting 3 ways x1 min each   Walking with Sports Cord 30# forward/reverse & backward/reverds x10 each, 25# sidestepping    Other Standing Knee Exercises modified squatting with red pysioball 3 x 10   Ankle Exercises: Seated   Heel Raises --                  PT Short Term Goals - 10/20/14 1041    PT SHORT TERM GOAL #2   Title report a 25% reduction in episodes of Rt knee "locking" with standing activity  20%   Time 4   Period Weeks   Status Partially Met   PT SHORT TERM GOAL #3   Title ascend and descend steps with step-over-step gait >or = to 25% of the time   Time 4   Period Weeks   Status Achieved           PT Long Term Goals - 10/13/14 1024    PT LONG TERM GOAL #1   Title be independent in advanced HEP  Time 8   Period Weeks   Status On-going   PT LONG TERM GOAL #2   Title report a 60% reduction in Rt knee "locking" with standing activity   Time 8   Period Weeks   Status On-going   PT LONG TERM GOAL #3   Title ascend and descend steps with step-over-step gait > or = to 60%  of the time   Status Achieved   PT LONG TERM GOAL #4   Title demonstrate 4+/5 Rt hip flexion and abduction strength to improve stability with walking   Time 8   Period Weeks   Status On-going   PT LONG TERM GOAL #5   Title report a 60% reduction in Rt knee pain with standing activity   Time 8   Period Weeks   Status On-going               Plan - 10/20/14 1039    Clinical Impression Statement Pt reports noticed slight improvement in negotiating her 8inch stairs at home since strart of care. Pt able to tolerate 30# with resisted walking. Pt continues to benefit from skilled Pt to improve strength.   Pt will benefit from skilled therapeutic intervention in order to improve on the following deficits Pain;Decreased strength;Decreased  mobility;Decreased activity tolerance   Rehab Potential Good   PT Frequency 2x / week   PT Duration 8 weeks   PT Next Visit Plan Continue with Bil LE strength with focus on quadriceps and Rt hip   Consulted and Agree with Plan of Care Patient        Problem List Patient Active Problem List   Diagnosis Date Noted  . Arthritis 02/24/2014  . HTN (hypertension) 02/24/2014  . Hyperlipidemia 02/24/2014  . Breast cancer, right, upper outer quadrant  07/05/2010    NAUMANN-HOUEGNIFIO,Eunie Lawn PTA 10/20/2014, 5:39 PM  [facility] [address], [place], [place], [address] Phone: [phone]   Fax:  [phone]

## 2014-10-22 ENCOUNTER — Encounter: Payer: Self-pay | Admitting: Physical Therapy

## 2014-10-22 ENCOUNTER — Ambulatory Visit: Payer: Medicare Other | Admitting: Physical Therapy

## 2014-10-22 DIAGNOSIS — M25561 Pain in right knee: Secondary | ICD-10-CM | POA: Diagnosis not present

## 2014-10-22 DIAGNOSIS — R29898 Other symptoms and signs involving the musculoskeletal system: Secondary | ICD-10-CM

## 2014-10-22 NOTE — Therapy (Signed)
[facility] [address] [place], [place], [address] Phone: [phone]   Fax:  [phone]  Physical Therapy Treatment  Patient Details  Name: Yolanda Peters MRN: [id number] Date of Birth: 1940-03-23 Referring Provider:  Eliezer Lofts, PA-C  Encounter Date: 10/22/2014      PT End of Session - 10/22/14 1037    Visit Number 12   Number of Visits 19   Date for PT Re-Evaluation 11/12/14   PT Start Time 2993   PT Stop Time 1100   PT Time Calculation (min) 45 min   Activity Tolerance Patient tolerated treatment well   Behavior During Therapy [facility] for tasks assessed/performed      Past Medical History  Diagnosis Date  . Arthritis   . Hypertension   . Allergy   . Cancer [facility])     right breast   . Hyperlipidemia     takes Niacin    Past Surgical History  Procedure Laterality Date  . Breast ductal system excision      right breast  . Elbow surgery      left  . Cataract extraction      bilateral  . Myomectomy    . Cesarean section    . Breast surgery  august 2011    right partial mastectomy    There were no vitals filed for this visit.  Visit Diagnosis:  Right knee pain  Right leg weakness      Subjective Assessment - 10/22/14 1031    Subjective No complain of pain, but right leg is weak   Currently in Pain? No/denies                         [facility] Adult PT Treatment/Exercise - 10/22/14 0001    Ambulation/Gait   Stairs Yes   Stairs Assistance 7: Independent   Stair Management Technique One rail Left   Number of Stairs 24   Gait Comments step over step, but stairs at home [place] were it is more difficult.  descending more challenging due to weakness   Exercises   Exercises Knee/Hip;Ankle   Knee/Hip Exercises: Stretches   Sports administrator Both;3 reps  at stairs   Knee/Hip Exercises: Aerobic   Recumbent Bike Level 2x 10 minutes    Nustep L2 x 10 min   Knee/Hip Exercises:  Machines for Strengthening   Total Gym Leg Press seat#6  70# B LE 3 x10, 30# Rt LE 2 x10   Knee/Hip Exercises: Standing   Lateral Step Up 2 sets;Hand Hold: 1;Step Height: 6";Right  3# added   Forward Step Up 2 sets;10 reps;Step Height: 6";Right  3#added   Step Down Left;2 sets;10 reps   Rebounder weight shifting 3 ways x1 min each   Walking with Sports Cord 30# forward/reverse & backward/reverds x10 each, 25# sidestepping    Other Standing Knee Exercises modified squatting with red pysioball 3 x 10                  PT Short Term Goals - 10/20/14 1041    PT SHORT TERM GOAL #2   Title report a 25% reduction in episodes of Rt knee "locking" with standing activity  20%   Time 4   Period Weeks   Status Partially Met   PT SHORT TERM GOAL #3   Title ascend and descend steps with step-over-step gait >or = to 25% of the time   Time 4  Period Weeks   Status Achieved           PT Long Term Goals - 10/13/14 1024    PT LONG TERM GOAL #1   Title be independent in advanced HEP   Time 8   Period Weeks   Status On-going   PT LONG TERM GOAL #2   Title report a 60% reduction in Rt knee "locking" with standing activity   Time 8   Period Weeks   Status On-going   PT LONG TERM GOAL #3   Title ascend and descend steps with step-over-step gait > or = to 60%  of the time   Status Achieved   PT LONG TERM GOAL #4   Title demonstrate 4+/5 Rt hip flexion and abduction strength to improve stability with walking   Time 8   Period Weeks   Status On-going   PT LONG TERM GOAL #5   Title report a 60% reduction in Rt knee pain with standing activity   Time 8   Period Weeks   Status On-going               Plan - 10/22/14 1038    Clinical Impression Statement Pt was able to tolerate leg press, but limited weight. Pt will continue to improve with strength, endurance and flexibility in bil LE with skilled PT   Pt will benefit from skilled therapeutic intervention in order to  improve on the following deficits Pain;Decreased strength;Decreased mobility;Decreased activity tolerance   Rehab Potential Good   PT Frequency 2x / week   PT Duration 8 weeks   PT Treatment/Interventions ADLs/Self Care Home Management;Electrical Stimulation;Moist Heat;Therapeutic exercise;Therapeutic activities;Functional mobility training;Stair training;Gait training;Neuromuscular re-education;Patient/family education;Manual techniques;Passive range of motion   PT Next Visit Plan Continue with Bil LE strength with focus on quadriceps and Rt hip   Consulted and Agree with Plan of Care Patient        Problem List Patient Active Problem List   Diagnosis Date Noted  . Arthritis 02/24/2014  . HTN (hypertension) 02/24/2014  . Hyperlipidemia 02/24/2014  . Breast cancer, right, upper outer quadrant  07/05/2010    NAUMANN-HOUEGNIFIO,Fannie Alomar PTA 10/22/2014, 10:53 AM  [facility] [address] [place] [address], [place], [place], [address] Phone: [phone]   Fax:  [phone]

## 2014-10-27 ENCOUNTER — Ambulatory Visit: Payer: Medicare Other | Admitting: Physical Therapy

## 2014-10-27 ENCOUNTER — Encounter: Payer: Self-pay | Admitting: Physical Therapy

## 2014-10-27 DIAGNOSIS — M25561 Pain in right knee: Secondary | ICD-10-CM | POA: Diagnosis not present

## 2014-10-27 DIAGNOSIS — R29898 Other symptoms and signs involving the musculoskeletal system: Secondary | ICD-10-CM

## 2014-10-27 NOTE — Therapy (Signed)
[facility] [address] [place], [place], [address] Phone: [phone]   Fax:  [phone]  Physical Therapy Treatment  Patient Details  Name: Yolanda Peters MRN: [id number] Date of Birth: [birth year] No Data Recorded  Encounter Date: 10/27/2014      PT End of Session - 10/27/14 1427    Visit Number 13   Number of Visits 19   Date for PT Re-Evaluation 11/12/14   PT Start Time 1404   PT Stop Time 1445   PT Time Calculation (min) 41 min   Activity Tolerance Patient tolerated treatment well   Behavior During Therapy [facility] for tasks assessed/performed      Past Medical History  Diagnosis Date  . Arthritis   . Hypertension   . Allergy   . Cancer [facility])     right breast   . Hyperlipidemia     takes Niacin    Past Surgical History  Procedure Laterality Date  . Breast ductal system excision      right breast  . Elbow surgery      left  . Cataract extraction      bilateral  . Myomectomy    . Cesarean section    . Breast surgery  august 2011    right partial mastectomy    There were no vitals filed for this visit.  Visit Diagnosis:  Right leg weakness  Right knee pain      Subjective Assessment - 10/27/14 1405    Subjective I've already been to water aerobics and walked on the treadmill today.   Currently in Pain? No/denies   Multiple Pain Sites No                         [facility] Adult PT Treatment/Exercise - 10/27/14 0001    Knee/Hip Exercises: Aerobic   Recumbent Bike L2x 6 min   Nustep L3 x 10 min   Knee/Hip Exercises: Machines for Strengthening   Total Gym Leg Press seat #6 bil 60#, reduced 70# was "too heavy",3 x 10; RTLE 25# 2 x10   Knee/Hip Exercises: Standing   Lateral Step Up 2 sets;Hand Hold: 1;Step Height: 6";Right  3# added   Forward Step Up 2 sets;10 reps;Step Height: 6";Right   Rebounder weight shifting 3 ways x1 min each                  PT Short  Term Goals - 10/27/14 1406    PT SHORT TERM GOAL #2   Title report a 25% reduction in episodes of Rt knee "locking" with standing activity   Time 4   Period Weeks   Status Achieved  no > 25%           PT Long Term Goals - 10/27/14 1407    PT LONG TERM GOAL #3   Title ascend and descend steps with step-over-step gait > or = to 60%  of the time   Time 8   Period Weeks   PT LONG TERM GOAL #5   Title report a 60% reduction in Rt knee pain with standing activity   Time 8   Period Weeks   Status Achieved               Plan - 10/27/14 1429    Clinical Impression Statement Pt reports knee locking 25% less and has no pain when standing, meeting these goals. She continues to exercise daily, including water exercise  and treadmill walking.    Pt will benefit from skilled therapeutic intervention in order to improve on the following deficits Pain;Decreased strength;Decreased mobility;Decreased activity tolerance   Rehab Potential Good   PT Frequency 2x / week   PT Duration 8 weeks   PT Treatment/Interventions ADLs/Self Care Home Management;Electrical Stimulation;Moist Heat;Therapeutic exercise;Therapeutic activities;Functional mobility training;Stair training;Gait training;Neuromuscular re-education;Patient/family education;Manual techniques;Passive range of motion   PT Next Visit Plan Continue with Bil LE strength with focus on quadriceps and Rt hip   Consulted and Agree with Plan of Care Patient        Problem List Patient Active Problem List   Diagnosis Date Noted  . Arthritis 02/24/2014  . HTN (hypertension) 02/24/2014  . Hyperlipidemia 02/24/2014  . Breast cancer, right, upper outer quadrant  07/05/2010    Lexi Conaty, PTA 10/27/2014, 2:37 PM  [facility] [address] [place], [place], [address] Phone: [phone]   Fax:  [phone]  Name: Yolanda Peters MRN: [id number] Date of Birth:  May 27, 1940

## 2014-10-29 ENCOUNTER — Encounter: Payer: Self-pay | Admitting: Physical Therapy

## 2014-10-29 ENCOUNTER — Ambulatory Visit: Payer: Medicare Other | Admitting: Physical Therapy

## 2014-10-29 DIAGNOSIS — M25561 Pain in right knee: Secondary | ICD-10-CM | POA: Diagnosis not present

## 2014-10-29 DIAGNOSIS — R29898 Other symptoms and signs involving the musculoskeletal system: Secondary | ICD-10-CM

## 2014-10-29 NOTE — Therapy (Signed)
[facility] [address] [place], [place], [address] Phone: [phone]   Fax:  [phone]  Physical Therapy Treatment  Patient Details  Name: Yolanda Peters MRN: [id number] Date of Birth: 1940-07-05 No Data Recorded  Encounter Date: 10/29/2014      PT End of Session - 10/29/14 1437    Visit Number 14   Number of Visits 19   Date for PT Re-Evaluation 11/12/14   PT Start Time 7253   PT Stop Time 1438   PT Time Calculation (min) 40 min   Activity Tolerance Patient tolerated treatment well   Behavior During Therapy [facility] for tasks assessed/performed      Past Medical History  Diagnosis Date  . Arthritis   . Hypertension   . Allergy   . Cancer [facility])     right breast   . Hyperlipidemia     takes Niacin    Past Surgical History  Procedure Laterality Date  . Breast ductal system excision      right breast  . Elbow surgery      left  . Cataract extraction      bilateral  . Myomectomy    . Cesarean section    . Breast surgery  august 2011    right partial mastectomy    There were no vitals filed for this visit.  Visit Diagnosis:  Right leg weakness      Subjective Assessment - 10/29/14 1401    Subjective No pain today.   Currently in Pain? No/denies   Multiple Pain Sites No                         [facility] Adult PT Treatment/Exercise - 10/29/14 0001    Knee/Hip Exercises: Aerobic   Nustep L3 x 5min   Knee/Hip Exercises: Machines for Strengthening   Total Gym Leg Press seat #6 bil 60#,  ,3 x 10; RTLE 25# 3 x10   Knee/Hip Exercises: Standing   Lateral Step Up 2 sets;Hand Hold: 1;Step Height: 6";Right  3# added   Forward Step Up 2 sets;10 reps;Step Height: 6";Right   Step Down Right;2 sets;10 reps;Hand Hold: 2   Rebounder weight shifting 3 ways x1 min each   Walking with Sports Cord 30# 10 x each direction 4 ways                  PT Short Term Goals - 10/27/14  1406    PT SHORT TERM GOAL #2   Title report a 25% reduction in episodes of Rt knee "locking" with standing activity   Time 4   Period Weeks   Status Achieved  no > 25%           PT Long Term Goals - 10/27/14 1407    PT LONG TERM GOAL #3   Title ascend and descend steps with step-over-step gait > or = to 60%  of the time   Time 8   Period Weeks   PT LONG TERM GOAL #5   Title report a 60% reduction in Rt knee pain with standing activity   Time 8   Period Weeks   Status Achieved               Plan - 10/29/14 1437    Clinical Impression Statement No pain complaints. Doing better on leg press with lighter weights as her knee crepitus is better. Approaching DC 11/12/14.   Pt will benefit  from skilled therapeutic intervention in order to improve on the following deficits Pain;Decreased strength;Decreased mobility;Decreased activity tolerance   Rehab Potential Good   PT Frequency 2x / week   PT Duration 8 weeks   PT Treatment/Interventions ADLs/Self Care Home Management;Electrical Stimulation;Moist Heat;Therapeutic exercise;Therapeutic activities;Functional mobility training;Stair training;Gait training;Neuromuscular re-education;Patient/family education;Manual techniques;Passive range of motion   PT Next Visit Plan Continue with Bil LE strength with focus on quadriceps and Rt hip   Consulted and Agree with Plan of Care Patient        Problem List Patient Active Problem List   Diagnosis Date Noted  . Arthritis 02/24/2014  . HTN (hypertension) 02/24/2014  . Hyperlipidemia 02/24/2014  . Breast cancer, right, upper outer quadrant  07/05/2010    Benny Henrie, PTA 10/29/2014, 2:39 PM  [facility] [address] [place], [place], [address] Phone: [phone]   Fax:  [phone]  Name: Yolanda Peters MRN: [id number] Date of Birth: [birth year]

## 2014-11-03 ENCOUNTER — Encounter: Payer: Self-pay | Admitting: Physical Therapy

## 2014-11-03 ENCOUNTER — Ambulatory Visit: Payer: Medicare Other | Admitting: Physical Therapy

## 2014-11-03 DIAGNOSIS — M25561 Pain in right knee: Secondary | ICD-10-CM | POA: Diagnosis not present

## 2014-11-03 DIAGNOSIS — R29898 Other symptoms and signs involving the musculoskeletal system: Secondary | ICD-10-CM

## 2014-11-03 NOTE — Therapy (Signed)
[facility] [address], [place], [place], [address] Phone: [phone]   Fax:  [phone]  Physical Therapy Treatment  Patient Details  Name: Yolanda Peters MRN: [id number] Date of Birth: 1940/11/09 No Data Recorded  Encounter Date: 11/03/2014      PT End of Session - 11/03/14 1433    Visit Number 15   Number of Visits 19   Date for PT Re-Evaluation 11/12/14   PT Start Time 1400   PT Stop Time 1445   PT Time Calculation (min) 45 min   Activity Tolerance Patient tolerated treatment well   Behavior During Therapy [facility] for tasks assessed/performed      Past Medical History  Diagnosis Date  . Arthritis   . Hypertension   . Allergy   . Cancer [facility])     right breast   . Hyperlipidemia     takes Niacin    Past Surgical History  Procedure Laterality Date  . Breast ductal system excision      right breast  . Elbow surgery      left  . Cataract extraction      bilateral  . Myomectomy    . Cesarean section    . Breast surgery  august 2011    right partial mastectomy    There were no vitals filed for this visit.  Visit Diagnosis:  Right leg weakness      Subjective Assessment - 11/03/14 1406    Subjective Had earlier moment of knee locking. No pain, just locks.    Currently in Pain? No/denies   Multiple Pain Sites No                         [facility] Adult PT Treatment/Exercise - 11/03/14 0001    Knee/Hip Exercises: Aerobic   Recumbent Bike L2 x 10    Nustep L4 x 10    Knee/Hip Exercises: Machines for Strengthening   Total Gym Leg Press Seat #6 60# 30x RTLE    Knee/Hip Exercises: Standing   Lateral Step Up Both;2 sets;10 reps;Hand Hold: 2;Step Height: 6"   Forward Step Up 2 sets;10 reps;Step Height: 6";Right   Step Down Right;2 sets;10 reps;Hand Hold: 2   Rebounder weight shifting 3 ways x1 min each  No hands   Walking with Sports Cord 30# 10 x each direction 4 ways                   PT Short Term Goals - 10/27/14 1406    PT SHORT TERM GOAL #2   Title report a 25% reduction in episodes of Rt knee "locking" with standing activity   Time 4   Period Weeks   Status Achieved  no > 25%           PT Long Term Goals - 11/03/14 1408    PT LONG TERM GOAL #2   Title report a 60% reduction in Rt knee "locking" with standing activity   Time 8   Period Weeks   Status On-going  No real change per pt report today.               Plan - 11/03/14 1434    Clinical Impression Statement Pt plans on discharge this week and will continue with her exercises at the [facility]. She does report the knee will lock if she stands with her knees"too straight."    Pt will benefit from skilled therapeutic intervention  in order to improve on the following deficits Pain;Decreased strength;Decreased mobility;Decreased activity tolerance   Rehab Potential Good   PT Frequency 2x / week   PT Duration 8 weeks   PT Treatment/Interventions ADLs/Self Care Home Management;Electrical Stimulation;Moist Heat;Therapeutic exercise;Therapeutic activities;Functional mobility training;Stair training;Gait training;Neuromuscular re-education;Patient/family education;Manual techniques;Passive range of motion   PT Next Visit Plan discharge next  visit   Consulted and Agree with Plan of Care Patient        Problem List Patient Active Problem List   Diagnosis Date Noted  . Arthritis 02/24/2014  . HTN (hypertension) 02/24/2014  . Hyperlipidemia 02/24/2014  . Breast cancer, right, upper outer quadrant  07/05/2010    Andrez Lieurance, PTA 11/03/2014, 2:37 PM  [facility] [address], [place], [place], [address] Phone: [phone]   Fax:  [phone]  Name: Yolanda Peters MRN: [id number] Date of Birth: [birth year]

## 2014-11-05 ENCOUNTER — Ambulatory Visit: Payer: Medicare Other

## 2014-11-05 DIAGNOSIS — M25561 Pain in right knee: Secondary | ICD-10-CM

## 2014-11-05 DIAGNOSIS — R29898 Other symptoms and signs involving the musculoskeletal system: Secondary | ICD-10-CM

## 2014-11-05 NOTE — Therapy (Signed)
[facility] [address], [place], [place], [address] Phone: [phone]   Fax:  [phone]  Physical Therapy Treatment  Patient Details  Name: Yolanda Peters MRN: [id number] Date of Birth: 15-Nov-1940 Referring Provider: Eliezer Lofts, [facility]  Encounter Date: 11/05/2014      PT End of Session - 11/05/14 1434    Visit Number 15   PT Start Time 1401   PT Stop Time 1440   PT Time Calculation (min) 39 min   Activity Tolerance Patient tolerated treatment well   Behavior During Therapy [facility] for tasks assessed/performed      Past Medical History  Diagnosis Date  . Arthritis   . Hypertension   . Allergy   . Cancer [facility])     right breast   . Hyperlipidemia     takes Niacin    Past Surgical History  Procedure Laterality Date  . Breast ductal system excision      right breast  . Elbow surgery      left  . Cataract extraction      bilateral  . Myomectomy    . Cesarean section    . Breast surgery  august 2011    right partial mastectomy    There were no vitals filed for this visit.  Visit Diagnosis:  Right leg weakness  Right knee pain      Subjective Assessment - 11/05/14 1406    Subjective No pain, knee just locks.  Ready for D/C   Currently in Pain? No/denies            [facility] PT Assessment - 11/05/14 0001    Assessment   Medical Diagnosis Rt knee OA and pain (knee locks at times)   Referring Provider Eliezer Lofts, PAC   Onset Date/Surgical Date 09/16/12   Precautions   Precautions Other (comment)   Observation/Other Assessments   Focus on Therapeutic Outcomes (FOTO)  28% limitation   Strength   Overall Strength Deficits   Overall Strength Comments Rt SLR 4+/5, abduction 4/5                     [facility] Adult PT Treatment/Exercise - 11/05/14 0001    Knee/Hip Exercises: Aerobic   Recumbent Bike L2 x 10    Knee/Hip Exercises: Machines for Strengthening   Total Gym Leg  Press Seat #6 60# bil 3x10, 25# 2x10 Rt LE    Knee/Hip Exercises: Standing   Lateral Step Up Both;2 sets;10 reps;Hand Hold: 2;Step Height: 6"   Forward Step Up 2 sets;10 reps;Step Height: 6";Right   Step Down Right;2 sets;10 reps;Hand Hold: 2   Rebounder weight shifting 3 ways x1 min each  No hands   Walking with Sports Cord 30# 10 x each direction 4 ways                  PT Short Term Goals - 10/27/14 1406    PT SHORT TERM GOAL #2   Title report a 25% reduction in episodes of Rt knee "locking" with standing activity   Time 4   Period Weeks   Status Achieved  no > 25%           PT Long Term Goals - 11/05/14 1410    PT LONG TERM GOAL #1   Title be independent in advanced HEP   Status Achieved   PT LONG TERM GOAL #2   Title report a 60% reduction in Rt knee "locking" with standing  activity   Status Not Met   PT LONG TERM GOAL #3   Title ascend and descend steps with step-over-step gait > or = to 60%  of the time   Status Achieved   PT LONG TERM GOAL #4   Title demonstrate 4+/5 Rt hip flexion and abduction strength to improve stability with walking   Status Partially Met   PT LONG TERM GOAL #5   Title report a 60% reduction in Rt knee pain with standing activity   Status Achieved               Plan - 11/21/2014 1411    Clinical Impression Statement Pt will continue with her exercises at the [facility].  She denies any significant change with knee buckling.  She is able to ascend and descend steps with step-over-step gait.  Pt with improved yet still limited strength in the Rt hip.     PT Next Visit Plan D/C PT   Consulted and Agree with Plan of Care Patient          G-Codes - 21-Nov-2014 1418    Functional Assessment Tool Used FOTO: 28% limitation   Functional Limitation Mobility: Walking and moving around   Mobility: Walking and Moving Around Goal Status [phone]) At least 20 percent but less than 40 percent impaired, limited or restricted   Mobility: Walking  and Moving Around Discharge Status [id number]) At least 20 percent but less than 40 percent impaired, limited or restricted      Problem List Patient Active Problem List   Diagnosis Date Noted  . Arthritis 02/24/2014  . HTN (hypertension) 02/24/2014  . Hyperlipidemia 02/24/2014  . Breast cancer, right, upper outer quadrant  07/05/2010   PHYSICAL THERAPY DISCHARGE SUMMARY  Visits from Start of Care: 16  Current functional level related to goals / functional outcomes: Pt with continued Rt knee buckling with walking/standing.  See above for goal status.     Remaining deficits: See above.  Pt has HEP in place for hip and knee strength.     Education / Equipment: HEP Plan: Patient agrees to discharge.  Patient goals were partially met. Patient is being discharged due to being pleased with the current functional level.  ?????     Elicia Lui, PT 11/21/14, 2:41 PM  [facility] [address] [place], [place], [address] Phone: [phone]   Fax:  [phone]  Name: Yolanda Peters MRN: [id number] Date of Birth: 03/28/1940

## 2014-11-10 ENCOUNTER — Other Ambulatory Visit: Payer: Self-pay | Admitting: Oncology

## 2014-11-10 ENCOUNTER — Encounter: Payer: Medicare Other | Admitting: Physical Therapy

## 2014-11-11 ENCOUNTER — Telehealth: Payer: Self-pay | Admitting: Oncology

## 2014-11-11 NOTE — Telephone Encounter (Signed)
Called and moved patient up to 11:30/12:00 opening on dr ll as Erasmo Downer is for chemo patients

## 2014-11-12 ENCOUNTER — Ambulatory Visit: Payer: Medicare Other

## 2014-11-21 ENCOUNTER — Other Ambulatory Visit: Payer: Self-pay

## 2014-11-21 DIAGNOSIS — C50919 Malignant neoplasm of unspecified site of unspecified female breast: Secondary | ICD-10-CM

## 2014-11-23 ENCOUNTER — Other Ambulatory Visit: Payer: Self-pay | Admitting: Oncology

## 2014-11-24 ENCOUNTER — Telehealth: Payer: Self-pay | Admitting: Oncology

## 2014-11-24 ENCOUNTER — Encounter: Payer: Self-pay | Admitting: Oncology

## 2014-11-24 ENCOUNTER — Ambulatory Visit (HOSPITAL_BASED_OUTPATIENT_CLINIC_OR_DEPARTMENT_OTHER): Payer: Medicare Other | Admitting: Oncology

## 2014-11-24 ENCOUNTER — Other Ambulatory Visit (HOSPITAL_BASED_OUTPATIENT_CLINIC_OR_DEPARTMENT_OTHER): Payer: Medicare Other

## 2014-11-24 ENCOUNTER — Other Ambulatory Visit: Payer: Medicare Other

## 2014-11-24 ENCOUNTER — Ambulatory Visit: Payer: Medicare Other | Admitting: Oncology

## 2014-11-24 VITALS — BP 150/84 | HR 79 | Temp 97.5°F | Resp 18 | Ht 65.5 in | Wt 168.3 lb

## 2014-11-24 DIAGNOSIS — Z853 Personal history of malignant neoplasm of breast: Secondary | ICD-10-CM

## 2014-11-24 DIAGNOSIS — C50919 Malignant neoplasm of unspecified site of unspecified female breast: Secondary | ICD-10-CM

## 2014-11-24 DIAGNOSIS — N641 Fat necrosis of breast: Secondary | ICD-10-CM

## 2014-11-24 LAB — COMPREHENSIVE METABOLIC PANEL (CC13)
ALT: 23 U/L (ref 0–55)
AST: 21 U/L (ref 5–34)
Albumin: 4 g/dL (ref 3.5–5.0)
Alkaline Phosphatase: 96 U/L (ref 40–150)
Anion Gap: 10 mEq/L (ref 3–11)
BUN: 21.5 mg/dL (ref 7.0–26.0)
CO2: 27 mEq/L (ref 22–29)
Calcium: 9.2 mg/dL (ref 8.4–10.4)
Chloride: 105 mEq/L (ref 98–109)
Creatinine: 0.7 mg/dL (ref 0.6–1.1)
EGFR: 81 mL/min/{1.73_m2} — ABNORMAL LOW (ref >90)
Glucose: 81 mg/dl (ref 70–140)
Potassium: 4 mEq/L (ref 3.5–5.1)
Sodium: 142 mEq/L (ref 136–145)
Total Bilirubin: 0.9 mg/dL (ref 0.20–1.20)
Total Protein: 6.7 g/dL (ref 6.4–8.3)

## 2014-11-24 LAB — CBC WITH DIFFERENTIAL/PLATELET
BASO%: 1.3 % (ref 0.0–2.0)
Basophils Absolute: 0.1 10*3/uL (ref 0.0–0.1)
EOS%: 4.8 % (ref 0.0–7.0)
Eosinophils Absolute: 0.3 10*3/uL (ref 0.0–0.5)
HCT: 44.7 % (ref 34.8–46.6)
HGB: 14.7 g/dL (ref 11.6–15.9)
LYMPH%: 25.5 % (ref 14.0–49.7)
MCH: 27.5 pg (ref 25.1–34.0)
MCHC: 32.8 g/dL (ref 31.5–36.0)
MCV: 84 fL (ref 79.5–101.0)
MONO#: 0.6 10*3/uL (ref 0.1–0.9)
MONO%: 10.7 % (ref 0.0–14.0)
NEUT#: 3.2 10*3/uL (ref 1.5–6.5)
NEUT%: 57.7 % (ref 38.4–76.8)
Platelets: 239 10*3/uL (ref 145–400)
RBC: 5.33 10*6/uL (ref 3.70–5.45)
RDW: 15.1 % — ABNORMAL HIGH (ref 11.2–14.5)
WBC: 5.6 10*3/uL (ref 3.9–10.3)
lymph#: 1.4 10*3/uL (ref 0.9–3.3)

## 2014-11-24 NOTE — Progress Notes (Signed)
OFFICE PROGRESS NOTE   November 24, 2014   Physicians:J.Russo, R.Neal, T.Rosenbower, R.Murray, J.Perry, S. Deveshwar (K.Khan, P.Rubin)  INTERVAL HISTORY:  Patient is seen, alone for visit, in follow up of DCIS right breast, for which she completed 5 years of preventative treatment with arimidex in Oct 2016. Most recent mammograms with bilateral tomo at [facility] 09-30-14, with changes thought fat necrosis at edges of large chronic seroma. Recommendation is for follow up right diagnostic mammogram in 6 months.  Patient reports having had bone density scan at Dr Duard Brady Sept, which she believes had just slightly low bone density; will request copy of report.  Patient is no longer followed by Dr Zella Richer. She sees Dr Nori Riis yearly in ~ Sept and Dr Virgina Jock yearly in ~ Aug.  Patient has felt generally better since stopping the aromatase inhibitor, tho no real improvement in any joint symptoms other than no longer tingling in great toe. She is not aware of any changes in breasts bilaterally, not generally uncomfortable from the post surgical changes upper outer right breast tho some itching there at times.  Otherwise energy and appetite are good, no recent infectious illness, no respiratory or GI complaints, no new or different pain, no bleeding. Doing water aerobic 3x weekly + silver sneakers. Remainder of 10 point Review of Systems negative.   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 Radiation completed 11-19-2009. Began Arimidex late Oct or Nov 2011, completed 5 years Oct 2016.  NOTE previous records mention tamoxifen or Femara, however per patient this has been Arimidex since start of treatment.      Objective:  Vital signs in last 24 hours:  BP 150/84 mmHg  Pulse 79  Temp(Src) 97.5 F (36.4 C) (Oral)  Resp 18  Ht 5' 5.5" (1.664  m)  Wt 168 lb 4.8 oz (76.34 kg)  BMI 27.57 kg/m2  SpO2 97% Weight up 1 lb Alert, oriented and appropriate. Ambulatory without difficulty.   HEENT:PERRL, sclerae not icteric. Oral mucosa moist without lesions, posterior pharynx clear.  Neck supple. No JVD.  Lymphatics:no cervical,supraclavicular, axillary or inguinal adenopathy Resp: clear to auscultation bilaterally and normal percussion bilaterally Cardio: regular rate and rhythm. No gallop. GI: soft, nontender, not distended, no mass or organomegaly. Normally active bowel sounds.  Musculoskeletal/ Extremities:UE and LE without pitting edema, cords, tenderness. Arthritic deformities hands. Neuro:  nonfocal PSYCH appropriate mood and affect Skin without rash, ecchymosis, petechiae Breasts: RIght with stable indurated area upper outer quadrant ~ 4-5 cm with radiation skin changes, otherwise bilaterally without dominant mass, skin or nipple findings. Axillae benign.  Lab Results:  Results for orders placed or performed in visit on 11/24/14  CBC with Differential  Result Value Ref Range   WBC 5.6 3.9 - 10.3 10e3/uL   NEUT# 3.2 1.5 - 6.5 10e3/uL   HGB 14.7 11.6 - 15.9 g/dL   HCT 44.7 34.8 - 46.6 %   Platelets 239 145 - 400 10e3/uL   MCV 84.0 79.5 - 101.0 fL   MCH 27.5 25.1 - 34.0 pg   MCHC 32.8 31.5 - 36.0 g/dL   RBC 5.33 3.70 - 5.45 10e6/uL   RDW 15.1 (H) 11.2 - 14.5 %   lymph# 1.4 0.9 - 3.3 10e3/uL   MONO# 0.6 0.1 - 0.9 10e3/uL   Eosinophils Absolute 0.3 0.0 - 0.5 10e3/uL   Basophils Absolute 0.1 0.0 - 0.1 10e3/uL  NEUT% 57.7 38.4 - 76.8 %   LYMPH% 25.5 14.0 - 49.7 %   MONO% 10.7 0.0 - 14.0 %   EOS% 4.8 0.0 - 7.0 %   BASO% 1.3 0.0 - 2.0 %  Comprehensive metabolic panel (Cmet) - [facility]  Result Value Ref Range   Sodium 142 136 - 145 mEq/L   Potassium 4.0 3.5 - 5.1 mEq/L   Chloride 105 98 - 109 mEq/L   CO2 27 22 - 29 mEq/L   Glucose 81 70 - 140 mg/dl   BUN 21.5 7.0 - 26.0 mg/dL   Creatinine 0.7 0.6 - 1.1 mg/dL   Total  Bilirubin 0.90 0.20 - 1.20 mg/dL   Alkaline Phosphatase 96 40 - 150 U/L   AST 21 5 - 34 U/L   ALT 23 0 - 55 U/L   Total Protein 6.7 6.4 - 8.3 g/dL   Albumin 4.0 3.5 - 5.0 g/dL   Calcium 9.2 8.4 - 10.4 mg/dL   Anion Gap 10 3 - 11 mEq/L   EGFR 81 (L) >90 ml/min/1.73 m2     Studies/Results:  Bone density scan done at Dr Juan Quam office ~ Sept 2016, will request copy of report for this EMR.   DIGITAL DIAGNOSTIC RIGHT MAMMOGRAM WITH 3D TOMOSYNTHESIS AND CAD 09-30-14  COMPARISON: Previous exam(s).  ACR Breast Density Category b: There are scattered areas of fibroglandular density.  FINDINGS: Postsurgical changes again noted within the upper-outer quadrant of the right breast with an associated large chronic seroma. There are predominantly coarse and dystrophic calcifications along the peripheral margins of the postoperative seroma, most suggestive of fat necrosis. There are no new dominant masses, suspicious calcifications or secondary signs of malignancy elsewhere within the right breast.  There are no new dominant masses, suspicious calcifications or secondary signs of malignancy within the left breast.  Mammographic images were processed with CAD.  IMPRESSION: Probably benign fat necrosis calcifications along the peripheral margins of the postoperative seroma in the right breast. Six-month follow-up right breast diagnostic mammogram recommended to ensure stability.  RECOMMENDATION: Follow-up right breast diagnostic mammogram, with magnification views, in 6 months.  I have discussed the findings and recommendations with the patient. Results were also provided in writing at the conclusion of the visit. If applicable, a reminder letter will be sent to the patient regarding the next appointment.  BI-RADS CATEGORY 3: Probably benign.  (note this was BILATERAL, see description of left breast in report)   Medications: I have reviewed the patient's current  medications.  DISCUSSION:  Patient aware of 6 month follow up right diagnostic mammogram, which I have ordered. As she has completed 5 years of the hormone blocker, she requests that follow up be with Dr Nori Riis and Dr Virgina Jock. She understands that I am glad to see her back at any time if she or other physicians request.   Assessment/Plan:  1. 74 y.o. with DCIS right breast, high grade and ER +, post partial mastectomy and RT, and on Arimidex from Nov 2011 thru Oct 2016. Needs right diagnostic mammogram at [facility] March 2017, then likely bilateral in Sept 2017. I will see her prn and she will continue to follow with Drs Nori Riis and Virgina Jock 2. 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 3..normal bone density 09-2012. Continuing calcium + D and regular exercising. Repeat bone density scan 09-2014 appropriate due to high risk aromatase inhibitor, which was done at Dr Verlon Au office and patient believes was either normal or slightly  low.  4.arthritis, stable and followed by Dr Estanislado Pandy. Nothing particularly better off arimidex, so likely none of symptoms were aromatase inhibitor arthralgias. 5.diverticulosis and diminutive polyp at last colonoscopy 2010, next due this fall 6.flu vaccine 09-24-14.  All questions answered. Cc Drs Nori Riis and Virgina Jock. Time spent 20 min including >50% counseling and coordination of care.   Marten Iles P, MD   11/24/2014, 5:40 PM

## 2014-11-24 NOTE — Telephone Encounter (Signed)
mammo order noted and wil fax to the breast center due to hold time

## 2014-11-25 DIAGNOSIS — N641 Fat necrosis of breast: Secondary | ICD-10-CM | POA: Insufficient documentation

## 2014-11-26 ENCOUNTER — Telehealth: Payer: Self-pay | Admitting: Oncology

## 2014-11-26 NOTE — Telephone Encounter (Signed)
Faxed over 42 pages to Nicut at [facility] and requested Tedra Coupe to fedex scans along with reports to [facility]

## 2014-11-26 NOTE — Telephone Encounter (Signed)
Scanned bone density report into epic from Dr. Nori Riis

## 2014-11-26 NOTE — Telephone Encounter (Signed)
Requested the bone density scan from Dr. Verlon Au Office

## 2014-11-27 ENCOUNTER — Encounter: Payer: Self-pay | Admitting: Oncology

## 2014-11-27 NOTE — Progress Notes (Signed)
Medical Oncology  Bone density scan report dated 09-24-14 received from [facility] Normal bone density, lowest T score -1.0, tho slightly lower than priors. Copy sent to [facility] to scan  L.Marko Plume, MD

## 2015-03-31 ENCOUNTER — Ambulatory Visit
Admission: RE | Admit: 2015-03-31 | Discharge: 2015-03-31 | Disposition: A | Discharge status: Home / Self Care | Payer: Medicare Other | Source: Ambulatory Visit | Attending: Oncology | Admitting: Oncology

## 2015-03-31 ENCOUNTER — Telehealth: Payer: Self-pay

## 2015-03-31 ENCOUNTER — Other Ambulatory Visit: Payer: Self-pay | Admitting: Oncology

## 2015-03-31 DIAGNOSIS — C50919 Malignant neoplasm of unspecified site of unspecified female breast: Secondary | ICD-10-CM

## 2015-03-31 NOTE — Telephone Encounter (Signed)
Yolanda Peters from [facility] imaging called requesting an order for [place] of R breast to be done while pt is in their office today. The MD there wants to do this for a large seroma in her breast that has not been imaged in quite awhile. Verbal order given.

## 2015-08-20 ENCOUNTER — Other Ambulatory Visit: Payer: Self-pay | Admitting: Obstetrics & Gynecology

## 2015-08-20 DIAGNOSIS — Z853 Personal history of malignant neoplasm of breast: Secondary | ICD-10-CM

## 2015-10-01 ENCOUNTER — Ambulatory Visit
Admission: RE | Admit: 2015-10-01 | Discharge: 2015-10-01 | Disposition: A | Discharge status: Home / Self Care | Payer: Medicare Other | Source: Ambulatory Visit | Attending: Obstetrics & Gynecology | Admitting: Obstetrics & Gynecology

## 2015-10-01 DIAGNOSIS — Z853 Personal history of malignant neoplasm of breast: Secondary | ICD-10-CM

## 2015-10-19 ENCOUNTER — Ambulatory Visit (INDEPENDENT_AMBULATORY_CARE_PROVIDER_SITE_OTHER): Payer: Medicare Other | Admitting: Rheumatology

## 2015-10-19 DIAGNOSIS — M1711 Unilateral primary osteoarthritis, right knee: Secondary | ICD-10-CM | POA: Diagnosis not present

## 2015-10-19 DIAGNOSIS — M1712 Unilateral primary osteoarthritis, left knee: Secondary | ICD-10-CM

## 2015-10-20 ENCOUNTER — Ambulatory Visit: Payer: Medicare Other | Attending: Rheumatology

## 2015-10-20 ENCOUNTER — Ambulatory Visit (INDEPENDENT_AMBULATORY_CARE_PROVIDER_SITE_OTHER): Payer: Medicare Other | Admitting: Rheumatology

## 2015-10-20 DIAGNOSIS — M25511 Pain in right shoulder: Secondary | ICD-10-CM

## 2015-10-20 DIAGNOSIS — Z09 Encounter for follow-up examination after completed treatment for conditions other than malignant neoplasm: Secondary | ICD-10-CM

## 2015-10-20 DIAGNOSIS — R29898 Other symptoms and signs involving the musculoskeletal system: Secondary | ICD-10-CM | POA: Insufficient documentation

## 2015-10-20 DIAGNOSIS — M25561 Pain in right knee: Secondary | ICD-10-CM | POA: Insufficient documentation

## 2015-10-20 DIAGNOSIS — M7071 Other bursitis of hip, right hip: Secondary | ICD-10-CM

## 2015-10-20 DIAGNOSIS — M7072 Other bursitis of hip, left hip: Secondary | ICD-10-CM

## 2015-10-20 NOTE — Patient Instructions (Addendum)
Do 2 sets of 10.  1-2 times a day.    Strengthening: Resisted Internal Rotation   Hold tubing in left hand, elbow at side and forearm out. Rotate forearm in across body. Repeat ____ times per set. Do ____ sets per session. Do ____ sessions per day.  http://orth.exer.us/830   Copyright  VHI. All rights reserved.  Strengthening: Resisted External Rotation   Hold tubing in right hand, elbow at side and forearm across body. Rotate forearm out. Repeat ____ times per set. Do ____ sets per session. Do ____ sessions per day.  http://orth.exer.us/828   Copyright  VHI. All rights reserved.  Strengthening: Resisted Flexion   Hold tubing with left arm at side. Pull forward and up. Move shoulder through pain-free range of motion. Repeat ____ times per set. Do ____ sets per session. Do ____ sessions per day.  http://orth.exer.us/824   Copyright  VHI. All rights reserved.  Strengthening: Resisted Extension   Hold tubing in right hand, arm forward. Pull arm back, elbow straight. Repeat ____ times per set. Do ____ sets per session. Do ____ sessions per day.  http://orth.exer.us/832       With resistive band anchored in door, grasp both ends. Keeping elbows bent, pull back, squeezing shoulder blades together. Hold _3__ seconds. Repeat _2x10___ times. Do _1-2___ sessions per day.  http://gt2.exer.us/98   [facility] [address] [place], [place] [address] Phone # [phone] Fax [phone]

## 2015-10-20 NOTE — Therapy (Signed)
[facility] [address], [place], [place], [address] Phone: [phone]   Fax:  [phone]  Physical Therapy Evaluation  Patient Details  Name: Yolanda Peters MRN: [id number] Date of Birth: 12-04-1940 Referring Provider: Bo Merino, MD  Encounter Date: 10/20/2015      PT End of Session - 10/20/15 1301    Visit Number 1   Number of Visits 10   Date for PT Re-Evaluation Dec 25, 2015   Authorization Type G-codes on visit 10   PT Start Time 1221   PT Stop Time 1256   PT Time Calculation (min) 35 min   Activity Tolerance Patient tolerated treatment well   Behavior During Therapy [facility] for tasks assessed/performed      Past Medical History:  Diagnosis Date  . Allergy   . Arthritis   . Cancer ([place])    right breast   . Hyperlipidemia    takes Niacin  . Hypertension     Past Surgical History:  Procedure Laterality Date  . BREAST DUCTAL SYSTEM EXCISION     right breast  . BREAST SURGERY  august 2011   right partial mastectomy  . CATARACT EXTRACTION     bilateral  . CESAREAN SECTION    . ELBOW SURGERY     left  . MYOMECTOMY      There were no vitals filed for this visit.       Subjective Assessment - 10/20/15 1228    Subjective Pt presents to PT with complaints of Rt shoulder pain that began ~3 months ago without cause.  Pt saw MD today and had x-ray and this was negative.  Pt is active with water aerobics and reports this aggraves the shoulder at times.     Pertinent History Breast cancer- no [place], arm bike or heat to shoulders   Diagnostic tests x-ray: today negative   Patient Stated Goals reduce Rt shoulder pain, perform water aerobics without pain, reduce pain with sleep at night   Currently in Pain? Yes   Pain Location Shoulder   Pain Orientation Right   Pain Descriptors / Indicators Aching;Dull   Pain Type Acute pain   Pain Onset More than a month ago   Pain Frequency Intermittent   Aggravating Factors  water aerobics, night time with sleep   Pain Relieving Factors Biofreeze, not using at water class            [facility] PT Assessment - 10/20/15 0001      Assessment   Medical Diagnosis Rt shoulder joint tendinopathy   Referring Provider Bo Merino, MD   Onset Date/Surgical Date 07/20/15   Hand Dominance Right   Next MD Visit 3 months   Prior Therapy none     Precautions   Precautions Other (comment)  breast cancer- no [place], arm bike or heat to shoulders     Restrictions   Weight Bearing Restrictions No     Balance Screen   Has the patient fallen in the past 6 months No   Has the patient had a decrease in activity level because of a fear of falling?  No   Is the patient reluctant to leave their home because of a fear of falling?  No     Home Environment   Living Environment Private residence   Type of [place]     Prior Function   Level of Independence Independent   Vocation Retired   Leisure crafts, Warehouse manager  Overall Cognitive Status Within Functional Limits for tasks assessed     Observation/Other Assessments   Focus on Therapeutic Outcomes (FOTO)  33% limitation     Posture/Postural Control   Posture/Postural Control Postural limitations   Postural Limitations Forward head;Rounded Shoulders     ROM / Strength   AROM / PROM / Strength AROM;PROM;Strength     AROM   Overall AROM  Within functional limits for tasks performed   Overall AROM Comments full Rt shoulder AROM that is equal to the Lt.  Cervical AROM is WFLs without pain.  No pain with Rt shoulder A/ROM     PROM   Overall PROM  Within functional limits for tasks performed   Overall PROM Comments no pain at end range of Rt shoulder AROM.  Good glenohumeral mobility     Strength   Overall Strength Within functional limits for tasks performed   Overall Strength Comments 4+/5 bil. UE strength throughout.      Palpation   Palpation comment mild palpable  tenderness at lateral deltoid insertion on the Rt.  Normal joint mobility of Rt glenohumeral joint     Special Tests    Special Tests Rotator Cuff Impingement;Biceps/Labral Tests   Rotator Cuff Impingment tests Empty Can test   Biceps/Labral tests Clunk Test     Empty Can test   Findings Negative   Side Right     Clunk Test   Findings Negative     Ambulation/Gait   Ambulation/Gait Yes   Ambulation/Gait Assistance 7: Independent   Gait Pattern Within Functional Limits                           PT Education - 10/20/15 1251    Education provided Yes   Education Details [place] 4 ways and rows   Person(s) Educated Patient   Methods Explanation;Demonstration;Handout;Tactile cues   Comprehension Verbalized understanding;Returned demonstration          PT Short Term Goals - 10/20/15 1306      PT SHORT TERM GOAL #1   Title be independent in initial HEP   Time 4   Period Weeks   Status New     PT SHORT TERM GOAL #2   Title report a 25% reduction in Rt shoulder pain with sleep at night   Time 4   Period Weeks   Status New     PT SHORT TERM GOAL #3   Title perform water aerobics with modifcations without increase in Rt shohoulder pain   Time 4   Period Weeks   Status New           PT Long Term Goals - 10/20/15 1222      PT LONG TERM GOAL #1   Title be independent in advanced HEP   Time 8   Period Weeks   Status New     PT LONG TERM GOAL #2   Title reduce FOTO to < or = to 29% limitation   Time 8   Period Weeks   Status New     PT LONG TERM GOAL #3   Title report a 60% reduction in Rt shouldre pain with sleep at night   Time 8   Period Weeks   Status New     PT LONG TERM GOAL #4   Title perform water aerobics with Rt UE without modifications or increase in pain   Time 8   Period Days   Status New  PT LONG TERM GOAL #5   Title --               Plan - 10/20/15 1301    Clinical Impression Statement Pt is a Rt hand  dominant female who presents to PT with ~3 month history of Rt shoulder pain without cause.  Pt is active in water aerobics and reports pain with this activity.  X-ray today was negative.  Pt demonstrates full Rt shoulder AROM and mild tenderness at deltoid insertion.  FOTO is 33% limitation and pt reports pain with water aerobics activity with the Rt UE and with sleep at night.  Pt will benefit from skilled PT for Rt shoulder strength with emphasis on posutural stabilization and glenohumeral stabilization, ionto, and manual to reduce pain at night and return to use of Rt UE with water aerobics.     Rehab Potential Good   PT Frequency 2x / week   PT Duration 8 weeks   PT Treatment/Interventions ADLs/Self Care Home Management;Cryotherapy;Electrical Stimulation;Iontophoresis 4mg /ml Dexamethasone;Functional mobility training;Therapeutic activities;Moist Heat;Therapeutic exercise;Neuromuscular re-education;Patient/family education;Passive range of motion;Manual techniques;Dry needling;Taping   PT Next Visit Plan ionto if order is signed, Rt shoulder stabilization, postural strength, manual   Consulted and Agree with Plan of Care Patient      Patient will benefit from skilled therapeutic intervention in order to improve the following deficits and impairments:  Pain, Postural dysfunction, Decreased activity tolerance  Visit Diagnosis: Acute pain of right shoulder - Plan: PT plan of care cert/re-cert      G-Codes - 123456 1220-04-21    Functional Assessment Tool Used FOTO: 33% limitation   Functional Limitation Carrying, moving and handling objects   Carrying, Moving and Handling Objects Current Status [id number]) At least 20 percent but less than 40 percent impaired, limited or restricted   Carrying, Moving and Handling Objects Goal Status [id number]) At least 20 percent but less than 40 percent impaired, limited or restricted       Problem List Patient Active Problem List   Diagnosis Date Noted  . 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 Arthritis 02/24/2014  . HTN (hypertension) 02/24/2014  . Hyperlipidemia 02/24/2014  . Breast cancer, right, upper outer quadrant  07/05/2010     Sigurd Sos, PT 10/20/15 1:17 PM  [facility] [address] [place], [place], [address] Phone: [phone]   Fax:  [phone]  Name: Yolanda Peters MRN: [id number] Date of Birth: [birth year]

## 2015-10-22 ENCOUNTER — Encounter: Payer: Self-pay | Admitting: Physical Therapy

## 2015-10-22 ENCOUNTER — Ambulatory Visit: Payer: Medicare Other | Admitting: Physical Therapy

## 2015-10-22 DIAGNOSIS — M25511 Pain in right shoulder: Secondary | ICD-10-CM | POA: Diagnosis not present

## 2015-10-22 DIAGNOSIS — M25561 Pain in right knee: Secondary | ICD-10-CM

## 2015-10-22 DIAGNOSIS — R29898 Other symptoms and signs involving the musculoskeletal system: Secondary | ICD-10-CM

## 2015-10-22 NOTE — Therapy (Signed)
[facility] [address] [place], [place], [address] Phone: [phone]   Fax:  [phone]  Physical Therapy Treatment  Patient Details  Name: Yolanda Peters MRN: [id number] Date of Birth: 1940-10-15 Referring Provider: Bo Merino, MD  Encounter Date: 10/22/2015      PT End of Session - 10/22/15 0856    Visit Number 2   Number of Visits 10   Date for PT Re-Evaluation 2016/01/07   Authorization Type G-codes on visit 10   PT Start Time 0845   PT Stop Time 0927   PT Time Calculation (min) 42 min   Activity Tolerance Patient tolerated treatment well   Behavior During Therapy [facility] for tasks assessed/performed      Past Medical History:  Diagnosis Date  . Allergy   . Arthritis   . Cancer ([place])    right breast   . Hyperlipidemia    takes Niacin  . Hypertension     Past Surgical History:  Procedure Laterality Date  . BREAST DUCTAL SYSTEM EXCISION     right breast  . BREAST SURGERY  august 2011   right partial mastectomy  . CATARACT EXTRACTION     bilateral  . CESAREAN SECTION    . ELBOW SURGERY     left  . MYOMECTOMY      There were no vitals filed for this visit.      Subjective Assessment - 10/22/15 0849    Subjective Pt reports just a small ache at the moment. Most pain is when rolling over in bed or lifting heavy things.    Pertinent History Breast cancer- no [place], arm bike or heat to shoulders   Diagnostic tests x-ray: today negative   Patient Stated Goals reduce Rt shoulder pain, perform water aerobics without pain, reduce pain with sleep at night   Currently in Pain? Yes   Pain Score 1    Pain Location Shoulder   Pain Orientation Right   Pain Type Acute pain   Pain Onset More than a month ago                         [facility] Adult PT Treatment/Exercise - 10/22/15 0001      Exercises   Exercises Shoulder     Shoulder Exercises: Supine   Horizontal ABduction  Strengthening;Both;20 reps;Theraband   Theraband Level (Shoulder Horizontal ABduction) Level 2 (Red)   External Rotation Strengthening;Both;20 reps;Theraband   Theraband Level (Shoulder External Rotation) Level 2 (Red)     Shoulder Exercises: Standing   External Rotation Strengthening;Both;20 reps;Theraband   Theraband Level (Shoulder External Rotation) Level 2 (Red)   Internal Rotation Strengthening;Both;20 reps;Theraband   Theraband Level (Shoulder Internal Rotation) Level 2 (Red)   Extension Strengthening;Both;20 reps;Theraband   Retraction Strengthening;Both;Theraband   Theraband Level (Shoulder Retraction) Level 2 (Red)     Manual Therapy   Manual Therapy Soft tissue mobilization   Manual therapy comments Pt sitting   Soft tissue mobilization Rt deltoid                  PT Short Term Goals - 10/22/15 0857      PT SHORT TERM GOAL #1   Title be independent in initial HEP   Time 4   Period Weeks   Status On-going     PT SHORT TERM GOAL #2   Title report a 25% reduction in Rt shoulder pain with sleep at night   Time  4   Period Weeks   Status On-going     PT SHORT TERM GOAL #3   Title perform water aerobics with modifcations without increase in Rt shohoulder pain   Time 4   Period Weeks   Status On-going           PT Long Term Goals - 10/22/15 ID:[id number]      PT LONG TERM GOAL #1   Title be independent in advanced HEP   Time 8   Period Weeks   Status On-going     PT LONG TERM GOAL #2   Title reduce FOTO to < or = to 29% limitation   Time 8   Period Weeks   Status On-going     PT LONG TERM GOAL #3   Title report a 60% reduction in Rt shouldre pain with sleep at night   Time 8   Period Weeks   Status On-going     PT LONG TERM GOAL #4   Title perform water aerobics with Rt UE without modifications or increase in pain   Time 8   Period Days   Status On-going     PT LONG TERM GOAL #5   Time 8   Period Weeks   Status Achieved                Plan - 10/22/15 0942    Clinical Impression Statement Pt presents with pain in Rt deltoid area mostly during sleep and heavy lifting. Pt is active with aquatic therapy exercise regularly. Pt needs verbal cues through out exercises to pull shoulders back. Decreased scapular stability and compensation with biceps and triceps.  Pt will continue to benfit from skilled therapy for scapular strengthening and stability and postural training so patient can return to prior level or activties with no pain.    Rehab Potential Good   PT Frequency 2x / week   PT Duration 8 weeks   PT Treatment/Interventions ADLs/Self Care Home Management;Cryotherapy;Electrical Stimulation;Iontophoresis 4mg /ml Dexamethasone;Functional mobility training;Therapeutic activities;Moist Heat;Therapeutic exercise;Neuromuscular re-education;Patient/family education;Passive range of motion;Manual techniques;Dry needling;Taping   PT Next Visit Plan ionto if order is signed, Rt shoulder stabilization, postural strength, manual   Consulted and Agree with Plan of Care Patient      Patient will benefit from skilled therapeutic intervention in order to improve the following deficits and impairments:  Pain, Postural dysfunction, Decreased activity tolerance  Visit Diagnosis: Acute pain of right shoulder  Right leg weakness  Acute pain of right knee     Problem List Patient Active Problem List   Diagnosis Date Noted  . 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 Arthritis 02/24/2014  . HTN (hypertension) 02/24/2014  . Hyperlipidemia 02/24/2014  . Breast cancer, right, upper outer quadrant  07/05/2010    Mikle Bosworth 10/22/2015, 10:38 AM  [facility] [address] [place], [place], [address] Phone: [phone]   Fax:  [phone]  Name: Yolanda Peters MRN: [id number] Date of Birth: [birth year]

## 2015-10-27 ENCOUNTER — Encounter: Payer: Self-pay | Admitting: Physical Therapy

## 2015-10-27 ENCOUNTER — Ambulatory Visit: Payer: Medicare Other | Admitting: Physical Therapy

## 2015-10-27 DIAGNOSIS — M25561 Pain in right knee: Secondary | ICD-10-CM

## 2015-10-27 DIAGNOSIS — M25511 Pain in right shoulder: Secondary | ICD-10-CM | POA: Diagnosis not present

## 2015-10-27 DIAGNOSIS — R29898 Other symptoms and signs involving the musculoskeletal system: Secondary | ICD-10-CM

## 2015-10-27 NOTE — Therapy (Signed)
[facility] [address] [place], [place], [address] Phone: [phone]   Fax:  [phone]  Physical Therapy Treatment  Patient Details  Name: Yolanda Peters MRN: [id number] Date of Birth: 1940/09/13 Referring Provider: Bo Merino, MD  Encounter Date: 10/27/2015      PT End of Session - 10/27/15 1404    Visit Number 3   Number of Visits 10   Date for PT Re-Evaluation 2016/01/09   Authorization Type G-codes on visit 10   PT Start Time 1400   PT Stop Time 1439   PT Time Calculation (min) 39 min   Activity Tolerance Patient tolerated treatment well   Behavior During Therapy [facility] for tasks assessed/performed      Past Medical History:  Diagnosis Date  . Allergy   . Arthritis   . Cancer ([place])    right breast   . Hyperlipidemia    takes Niacin  . Hypertension     Past Surgical History:  Procedure Laterality Date  . BREAST DUCTAL SYSTEM EXCISION     right breast  . BREAST SURGERY  august 2011   right partial mastectomy  . CATARACT EXTRACTION     bilateral  . CESAREAN SECTION    . ELBOW SURGERY     left  . MYOMECTOMY      There were no vitals filed for this visit.      Subjective Assessment - 10/27/15 1400    Subjective Pt reports shoulder feeling good today. Felt much better after last session stating massage helped to ease a big knot.    Pertinent History Breast cancer- no [place], arm bike or heat to shoulders   Diagnostic tests x-ray: today negative   Patient Stated Goals reduce Rt shoulder pain, perform water aerobics without pain, reduce pain with sleep at night   Currently in Pain? Yes   Pain Score 1    Pain Location Shoulder   Pain Orientation Right   Pain Descriptors / Indicators Aching;Dull   Pain Type Acute pain   Pain Frequency Intermittent                         [facility] Adult PT Treatment/Exercise - 10/27/15 0001      Exercises   Exercises Shoulder     Shoulder Exercises: Supine   Horizontal ABduction Strengthening;Both;20 reps;Theraband   Theraband Level (Shoulder Horizontal ABduction) Level 2 (Red)   External Rotation Strengthening;Both;20 reps;Theraband   Theraband Level (Shoulder External Rotation) Level 2 (Red)   Other Supine Exercises D2 extension  red tband VC for technique     Shoulder Exercises: Standing   External Rotation Strengthening;Both;20 reps;Theraband   Theraband Level (Shoulder External Rotation) Level 2 (Red)   Internal Rotation Strengthening;Both;20 reps;Theraband   Theraband Level (Shoulder Internal Rotation) Level 2 (Red)   Extension Strengthening;Both;20 reps;Theraband   Retraction Strengthening;Both;Theraband   Theraband Level (Shoulder Retraction) Level 2 (Red)   Other Standing Exercises D1 flexion and extension  red tband 2x10     Manual Therapy   Manual Therapy Soft tissue mobilization   Manual therapy comments Pt sitting   Soft tissue mobilization Rt deltoid                  PT Short Term Goals - 10/27/15 1404      PT SHORT TERM GOAL #1   Title be independent in initial HEP   Time 4   Period Weeks   Status On-going  PT SHORT TERM GOAL #2   Title report a 25% reduction in Rt shoulder pain with sleep at night   Time 4   Period Weeks   Status On-going     PT SHORT TERM GOAL #3   Title perform water aerobics with modifcations without increase in Rt shohoulder pain   Time 4   Period Weeks   Status On-going           PT Long Term Goals - 10/27/15 1404      PT LONG TERM GOAL #1   Title be independent in advanced HEP   Time 8   Period Weeks   Status On-going     PT LONG TERM GOAL #2   Title reduce FOTO to < or = to 29% limitation   Time 8   Period Weeks   Status On-going     PT LONG TERM GOAL #3   Title report a 60% reduction in Rt shouldre pain with sleep at night   Time 8   Period Weeks   Status On-going     PT LONG TERM GOAL #4   Title perform water  aerobics with Rt UE without modifications or increase in pain   Time 8   Period Days   Status On-going               Plan - 10/27/15 1440    Clinical Impression Statement Pt repotrs shoulder feeling better but continues to have pain during sleep and lifting objects. Pt able to tolerate all exericses well with verbal cues for posture and correct movement patterns. Will continue to stabilize scapula and strengthen shoulder. Pt will continue to benefit from skilled therapy for shouler strengthening to return to prior level of activity with no pain.    Rehab Potential Good   PT Frequency 2x / week   PT Duration 8 weeks   PT Treatment/Interventions ADLs/Self Care Home Management;Cryotherapy;Electrical Stimulation;Iontophoresis 4mg /ml Dexamethasone;Functional mobility training;Therapeutic activities;Moist Heat;Therapeutic exercise;Neuromuscular re-education;Patient/family education;Passive range of motion;Manual techniques;Dry needling;Taping   PT Next Visit Plan ionto if order is signed, Rt shoulder stabilization, postural strength, manual as needed   Consulted and Agree with Plan of Care Patient      Patient will benefit from skilled therapeutic intervention in order to improve the following deficits and impairments:  Pain, Postural dysfunction, Decreased activity tolerance  Visit Diagnosis: Acute pain of right shoulder  Right leg weakness  Acute pain of right knee     Problem List Patient Active Problem List   Diagnosis Date Noted  . 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 Arthritis 02/24/2014  . HTN (hypertension) 02/24/2014  . Hyperlipidemia 02/24/2014  . Breast cancer, right, upper outer quadrant  07/05/2010    Mikle Bosworth PTA 10/27/2015, 2:42 PM  [facility] [address], [place], [place], [address] Phone: [phone]   Fax:  [phone]  Name: Yolanda Peters MRN: [id number] Date of  Birth: 1940-02-15

## 2015-10-29 ENCOUNTER — Ambulatory Visit: Payer: Medicare Other | Admitting: Physical Therapy

## 2015-10-29 ENCOUNTER — Encounter: Payer: Self-pay | Admitting: Physical Therapy

## 2015-10-29 DIAGNOSIS — R29898 Other symptoms and signs involving the musculoskeletal system: Secondary | ICD-10-CM

## 2015-10-29 DIAGNOSIS — M25561 Pain in right knee: Secondary | ICD-10-CM

## 2015-10-29 DIAGNOSIS — M25511 Pain in right shoulder: Secondary | ICD-10-CM | POA: Diagnosis not present

## 2015-10-29 NOTE — Therapy (Signed)
[facility] [address] [place], [place], [address] Phone: [phone]   Fax:  [phone]  Physical Therapy Treatment  Patient Details  Name: Yolanda Peters MRN: [id number] Date of Birth: 1940-11-03 Referring Provider: Bo Merino, MD  Encounter Date: 10/29/2015      PT End of Session - 10/29/15 1622    Visit Number 4   Number of Visits 10   Date for PT Re-Evaluation 12/23/2015   Authorization Type G-codes on visit 10   PT Start Time 1616   PT Stop Time 1655   PT Time Calculation (min) 39 min   Activity Tolerance Patient tolerated treatment well   Behavior During Therapy [facility] for tasks assessed/performed      Past Medical History:  Diagnosis Date  . Allergy   . Arthritis   . Cancer ([place])    right breast   . Hyperlipidemia    takes Niacin  . Hypertension     Past Surgical History:  Procedure Laterality Date  . BREAST DUCTAL SYSTEM EXCISION     right breast  . BREAST SURGERY  august 2011   right partial mastectomy  . CATARACT EXTRACTION     bilateral  . CESAREAN SECTION    . ELBOW SURGERY     left  . MYOMECTOMY      There were no vitals filed for this visit.      Subjective Assessment - 10/29/15 1620    Subjective Pt reports continues dull ache in deltoid muscles. Pt stated daughter is wondering if pt needs to see a massage therapist.    Pertinent History Breast cancer- no [place], arm bike or heat to shoulders   Diagnostic tests x-ray: today negative   Patient Stated Goals reduce Rt shoulder pain, perform water aerobics without pain, reduce pain with sleep at night   Currently in Pain? Yes   Pain Score 1    Pain Location Shoulder   Pain Orientation Right   Pain Descriptors / Indicators Aching;Dull   Pain Type Acute pain   Pain Onset More than a month ago   Pain Frequency Intermittent                         [facility] Adult PT Treatment/Exercise - 10/29/15 0001       Shoulder Exercises: Supine   Horizontal ABduction Strengthening;Both;20 reps;Theraband   Theraband Level (Shoulder Horizontal ABduction) Level 2 (Red)   External Rotation Strengthening;Both;20 reps;Theraband   Theraband Level (Shoulder External Rotation) Level 2 (Red)   Other Supine Exercises D2 extension  red tband VC for technique     Shoulder Exercises: Standing   External Rotation Strengthening;Both;20 reps;Theraband   Theraband Level (Shoulder External Rotation) Level 3 (Green)   Internal Rotation Strengthening;Both;20 reps;Theraband   Theraband Level (Shoulder Internal Rotation) Level 3 (Green)   Extension Strengthening;Both;20 reps;Theraband   Theraband Level (Shoulder Extension) Level 3 (Green)   Row Strengthening;Both;20 reps;Theraband   Theraband Level (Shoulder Row) Level 3 (Green)   Retraction Strengthening;Both;Theraband   Theraband Level (Shoulder Retraction) Level 1 (Yellow)   Other Standing Exercises D1 flexion and extension  red tband 2x10     Manual Therapy   Manual Therapy Soft tissue mobilization   Manual therapy comments Pt supine   Soft tissue mobilization Rt deltoid                  PT Short Term Goals - 10/27/15 1404  PT SHORT TERM GOAL #1   Title be independent in initial HEP   Time 4   Period Weeks   Status On-going     PT SHORT TERM GOAL #2   Title report a 25% reduction in Rt shoulder pain with sleep at night   Time 4   Period Weeks   Status On-going     PT SHORT TERM GOAL #3   Title perform water aerobics with modifcations without increase in Rt shohoulder pain   Time 4   Period Weeks   Status On-going           PT Long Term Goals - 10/27/15 1404      PT LONG TERM GOAL #1   Title be independent in advanced HEP   Time 8   Period Weeks   Status On-going     PT LONG TERM GOAL #2   Title reduce FOTO to < or = to 29% limitation   Time 8   Period Weeks   Status On-going     PT LONG TERM GOAL #3   Title report a  60% reduction in Rt shouldre pain with sleep at night   Time 8   Period Weeks   Status On-going     PT LONG TERM GOAL #4   Title perform water aerobics with Rt UE without modifications or increase in pain   Time 8   Period Days   Status On-going               Plan - 10/29/15 1649    Clinical Impression Statement Pt continues to have a dull ache in Rt deltoid. Pt able to complete all scapular strengthening exercises well. Tightness with trigger spot in Rt deltoid muscle belly that decreased some with manaul massage. MD has not yet signed order for ionot. Pt will continue to benefit from skilled therapy for scapular and shoulder strength and stability.    Rehab Potential Good   PT Frequency 2x / week   PT Duration 8 weeks   PT Treatment/Interventions ADLs/Self Care Home Management;Cryotherapy;Electrical Stimulation;Iontophoresis 4mg /ml Dexamethasone;Functional mobility training;Therapeutic activities;Moist Heat;Therapeutic exercise;Neuromuscular re-education;Patient/family education;Passive range of motion;Manual techniques;Dry needling;Taping   PT Next Visit Plan ionto if order is signed, Rt shoulder stabilization, postural strength, manual as needed   Consulted and Agree with Plan of Care Patient      Patient will benefit from skilled therapeutic intervention in order to improve the following deficits and impairments:  Pain, Postural dysfunction, Decreased activity tolerance  Visit Diagnosis: Acute pain of right shoulder  Right leg weakness  Acute pain of right knee     Problem List Patient Active Problem List   Diagnosis Date Noted  . 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 Arthritis 02/24/2014  . HTN (hypertension) 02/24/2014  . Hyperlipidemia 02/24/2014  . Breast cancer, right, upper outer quadrant  07/05/2010    Mikle Bosworth PTA 10/29/2015, 4:55 PM  [facility] [address] [place], [place], [address] Phone: [phone]   Fax:  [phone]  Name: Yolanda Peters MRN: [id number] Date of Birth: 28-Oct-1940

## 2015-11-03 ENCOUNTER — Encounter: Payer: Self-pay | Admitting: Physical Therapy

## 2015-11-03 ENCOUNTER — Ambulatory Visit: Payer: Medicare Other | Admitting: Physical Therapy

## 2015-11-03 DIAGNOSIS — R29898 Other symptoms and signs involving the musculoskeletal system: Secondary | ICD-10-CM

## 2015-11-03 DIAGNOSIS — M25511 Pain in right shoulder: Secondary | ICD-10-CM | POA: Diagnosis not present

## 2015-11-03 DIAGNOSIS — M25561 Pain in right knee: Secondary | ICD-10-CM

## 2015-11-03 NOTE — Patient Instructions (Signed)

## 2015-11-03 NOTE — Therapy (Signed)
[facility] [address] [place], [place], [address] Phone: [phone]   Fax:  [phone]  Physical Therapy Treatment  Patient Details  Name: Yolanda Peters MRN: [id number] Date of Birth: [birth year] Referring Provider: Bo Merino, MD  Encounter Date: 11/03/2015      PT End of Session - 11/03/15 1401    Visit Number 5   Number of Visits 10   Date for PT Re-Evaluation 01-12-2016   Authorization Type G-codes on visit 10   PT Start Time 1401   PT Stop Time 1440   PT Time Calculation (min) 39 min   Activity Tolerance Patient tolerated treatment well   Behavior During Therapy [facility] for tasks assessed/performed      Past Medical History:  Diagnosis Date  . Allergy   . Arthritis   . Cancer ([place])    right breast   . Hyperlipidemia    takes Niacin  . Hypertension     Past Surgical History:  Procedure Laterality Date  . BREAST DUCTAL SYSTEM EXCISION     right breast  . BREAST SURGERY  august 2011   right partial mastectomy  . CATARACT EXTRACTION     bilateral  . CESAREAN SECTION    . ELBOW SURGERY     left  . MYOMECTOMY      There were no vitals filed for this visit.      Subjective Assessment - 11/03/15 1403    Subjective Pt reports aches at night mostly. Unable to vaccume with right shoulder due to pain.    Pertinent History Breast cancer- no [place], arm bike or heat to shoulders   Diagnostic tests x-ray: today negative   Patient Stated Goals reduce Rt shoulder pain, perform water aerobics without pain, reduce pain with sleep at night   Currently in Pain? Yes   Pain Score 1    Pain Location Shoulder   Pain Orientation Right   Pain Descriptors / Indicators Aching;Dull   Pain Type Acute pain   Pain Onset More than a month ago   Pain Frequency Intermittent   Aggravating Factors  water arobics, sleeping   Pain Relieving Factors biofreeze, not using weights                         [facility] Adult PT Treatment/Exercise - 11/03/15 0001      Shoulder Exercises: Standing   Horizontal ABduction Strengthening;Both;20 reps;Theraband   Theraband Level (Shoulder Horizontal ABduction) Level 1 (Yellow)   Flexion Strengthening;Right;Weights;20 reps  #2   ABduction Strengthening;Right;20 reps  #2   Extension Strengthening;Both;20 reps;Theraband   Theraband Level (Shoulder Extension) Level 3 (Green)   Row Strengthening;Both;20 reps;Theraband   Theraband Level (Shoulder Row) Level 3 (Green)   Retraction Strengthening;Both;Theraband   Theraband Level (Shoulder Retraction) Level 2 (Red)   Other Standing Exercises D1 flexion and extension  red tband 2x10   Other Standing Exercises Curl press  #2     Shoulder Exercises: Body Blade   Flexion 30 seconds  Using #2 weight     Modalities   Modalities Iontophoresis     Iontophoresis   Type of Iontophoresis Dexamethasone  #1   Location Rt deltoid   Dose 1 ML   Time 6 hour patch     Manual Therapy   Manual Therapy Soft tissue mobilization   Manual therapy comments Pt supine   Soft tissue mobilization Rt deltoid  PT Education - 11/03/15 1441    Education provided Yes   Education Details [place]   Person(s) Educated Patient   Methods Explanation;Handout   Comprehension Verbalized understanding          PT Short Term Goals - 11/03/15 1402      PT SHORT TERM GOAL #1   Title be independent in initial HEP   Time 4   Period Weeks   Status On-going     PT SHORT TERM GOAL #2   Title report a 25% reduction in Rt shoulder pain with sleep at night   Time 4   Period Weeks   Status On-going     PT SHORT TERM GOAL #3   Title perform water aerobics with modifcations without increase in Rt shohoulder pain   Time 4   Period Weeks   Status On-going           PT Long Term Goals - 11/03/15 1407      PT LONG TERM GOAL #1   Title be independent in advanced HEP   Time 8   Period Weeks    Status On-going     PT LONG TERM GOAL #2   Title reduce FOTO to < or = to 29% limitation   Time 8   Period Weeks   Status On-going     PT LONG TERM GOAL #3   Title report a 60% reduction in Rt shouldre pain with sleep at night   Time 8   Period Weeks   Status On-going     PT LONG TERM GOAL #4   Title perform water aerobics with Rt UE without modifications or increase in pain   Time 8   Period Days   Status On-going               Plan - 11/03/15 1529    Clinical Impression Statement Pt reports deltoid feeling ebtter, mostly has pain at night while sleeping. Pt able to tolerate all strenghtneing exercises well with some muscle fatigue but no increase in pain. Pt will continue to benefit from skilled therapy for shoulder strenghtening and stabilization to return to previous level of function wihtout pain.    Rehab Potential Good   PT Frequency 2x / week   PT Duration 8 weeks   PT Treatment/Interventions ADLs/Self Care Home Management;Cryotherapy;Electrical Stimulation;Iontophoresis 4mg /ml Dexamethasone;Functional mobility training;Therapeutic activities;Moist Heat;Therapeutic exercise;Neuromuscular re-education;Patient/family education;Passive range of motion;Manual techniques;Dry needling;Taping   PT Next Visit Plan Ionot #2 shoulder strenghtening, posture training.    Consulted and Agree with Plan of Care Patient      Patient will benefit from skilled therapeutic intervention in order to improve the following deficits and impairments:  Pain, Postural dysfunction, Decreased activity tolerance  Visit Diagnosis: Acute pain of right shoulder  Right leg weakness  Acute pain of right knee     Problem List Patient Active Problem List   Diagnosis Date Noted  . 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 Arthritis 02/24/2014  . HTN (hypertension) 02/24/2014  . Hyperlipidemia 02/24/2014  . Breast cancer, right, upper outer quadrant  07/05/2010    Mikle Bosworth  PTA 11/03/2015, 4:41 PM  [facility] [address] [place], [place], [address] Phone: [phone]   Fax:  [phone]  Name: Yolanda Peters MRN: [id number] Date of Birth: 01-13-1940

## 2015-11-05 ENCOUNTER — Ambulatory Visit: Payer: Medicare Other

## 2015-11-05 DIAGNOSIS — M25511 Pain in right shoulder: Secondary | ICD-10-CM

## 2015-11-05 NOTE — Therapy (Signed)
[facility] [address] [place], [place], [address] Phone: [phone]   Fax:  [phone]  Physical Therapy Treatment  Patient Details  Name: Yolanda Peters MRN: [id number] Date of Birth: 09-02-1940 Referring Provider: Bo Merino, MD  Encounter Date: 11/05/2015      PT End of Session - 11/05/15 [id number]    Visit Number 6   Number of Visits 10   Date for PT Re-Evaluation 01-11-16   Authorization Type G-codes on visit 10   PT Start Time 0845   PT Stop Time 0923   PT Time Calculation (min) 38 min   Activity Tolerance Patient tolerated treatment well   Behavior During Therapy [facility] for tasks assessed/performed      Past Medical History:  Diagnosis Date  . Allergy   . Arthritis   . Cancer ([place])    right breast   . Hyperlipidemia    takes Niacin  . Hypertension     Past Surgical History:  Procedure Laterality Date  . BREAST DUCTAL SYSTEM EXCISION     right breast  . BREAST SURGERY  august 2011   right partial mastectomy  . CATARACT EXTRACTION     bilateral  . CESAREAN SECTION    . ELBOW SURGERY     left  . MYOMECTOMY      There were no vitals filed for this visit.      Subjective Assessment - 11/05/15 0851    Subjective Did OK with the ionto patch.  Pain in Rt shoulder is feeling painful at night.   Pertinent History Breast cancer- no [place], arm bike or heat to shoulders   Patient Stated Goals reduce Rt shoulder pain, perform water aerobics without pain, reduce pain with sleep at night   Currently in Pain? Yes   Pain Score 2    Pain Location Shoulder   Pain Orientation Right   Pain Descriptors / Indicators Aching;Dull   Pain Type Acute pain   Pain Onset More than a month ago   Pain Frequency Intermittent   Aggravating Factors  water aerobics, sleeping   Pain Relieving Factors biofreeze, not using weights                         [facility] Adult PT Treatment/Exercise -  11/05/15 0001      Shoulder Exercises: Supine   Horizontal ABduction Strengthening;Both;20 reps;Theraband   Theraband Level (Shoulder Horizontal ABduction) Level 2 (Red)  on foam roll   External Rotation Strengthening;Both;20 reps;Theraband   Theraband Level (Shoulder External Rotation) Level 2 (Red)  on foam roll   Other Supine Exercises D2 extension 2x10  red band on foam roll     Shoulder Exercises: Seated   Flexion Strengthening;Right;20 reps;Weights   Flexion Weight (lbs) 2   Abduction Strengthening;20 reps;Weights   ABduction Weight (lbs) 2   ABduction Limitations scaption 2# 2x10     Modalities   Modalities Iontophoresis     Iontophoresis   Type of Iontophoresis Dexamethasone  #2   Location Rt deltoid   Dose 1 ML   Time 6 hour patch     Manual Therapy   Manual Therapy Soft tissue mobilization;Passive ROM   Manual therapy comments Pt supine   Soft tissue mobilization soft tissue work to Rt deltoid and P/ROM to Rt shoulder into IR/ER and flexion                PT Education - 11/05/15 [id number]  Education provided Yes   Education Details ionto   Person(s) Educated Patient   Methods Explanation;Handout   Comprehension Verbalized understanding          PT Short Term Goals - 11/03/15 1402      PT SHORT TERM GOAL #1   Title be independent in initial HEP   Time 4   Period Weeks   Status On-going     PT SHORT TERM GOAL #2   Title report a 25% reduction in Rt shoulder pain with sleep at night   Time 4   Period Weeks   Status On-going     PT SHORT TERM GOAL #3   Title perform water aerobics with modifcations without increase in Rt shohoulder pain   Time 4   Period Weeks   Status On-going           PT Long Term Goals - 11/03/15 1407      PT LONG TERM GOAL #1   Title be independent in advanced HEP   Time 8   Period Weeks   Status On-going     PT LONG TERM GOAL #2   Title reduce FOTO to < or = to 29% limitation   Time 8   Period Weeks    Status On-going     PT LONG TERM GOAL #3   Title report a 60% reduction in Rt shouldre pain with sleep at night   Time 8   Period Weeks   Status On-going     PT LONG TERM GOAL #4   Title perform water aerobics with Rt UE without modifications or increase in pain   Time 8   Period Days   Status On-going               Plan - 11/05/15 [id number]    Clinical Impression Statement Pt reports that Rt shoulder is feeling better during the day and no change in symptoms at night.  Pt reports minimal compliance with HEP for Rt shoulder strength.  She continues to do water aerobics regularly.  Pt will continue to benefit from skilled PT for Rt shoulder strength, stabilization and pain management.     Rehab Potential Good   PT Frequency 2x / week   PT Duration 8 weeks   PT Treatment/Interventions ADLs/Self Care Home Management;Cryotherapy;Electrical Stimulation;Iontophoresis 4mg /ml Dexamethasone;Functional mobility training;Therapeutic activities;Moist Heat;Therapeutic exercise;Neuromuscular re-education;Patient/family education;Passive range of motion;Manual techniques;Dry needling;Taping   PT Next Visit Plan Ionto #3 shoulder strenghtening, posture training.    Consulted and Agree with Plan of Care Patient      Patient will benefit from skilled therapeutic intervention in order to improve the following deficits and impairments:  Pain, Postural dysfunction, Decreased activity tolerance  Visit Diagnosis: Acute pain of right shoulder     Problem List Patient Active Problem List   Diagnosis Date Noted  . 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 Arthritis 02/24/2014  . HTN (hypertension) 02/24/2014  . Hyperlipidemia 02/24/2014  . Breast cancer, right, upper outer quadrant  07/05/2010    Sigurd Sos, PT 11/05/15 9:24 AM  [facility] [address], [place], [place], [address] Phone: [phone]   Fax:   [phone]  Name: Yolanda Peters MRN: [id number] Date of Birth: [birth year]

## 2015-11-05 NOTE — Patient Instructions (Addendum)

## 2015-11-16 ENCOUNTER — Ambulatory Visit: Payer: Medicare Other | Attending: Rheumatology | Admitting: Physical Therapy

## 2015-11-16 ENCOUNTER — Encounter: Payer: Self-pay | Admitting: Physical Therapy

## 2015-11-16 DIAGNOSIS — M25511 Pain in right shoulder: Secondary | ICD-10-CM

## 2015-11-16 DIAGNOSIS — R29898 Other symptoms and signs involving the musculoskeletal system: Secondary | ICD-10-CM | POA: Insufficient documentation

## 2015-11-16 NOTE — Therapy (Signed)
[facility] [address], [place], [place], [address] Phone: [phone]   Fax:  [phone]  Physical Therapy Treatment  Patient Details  Name: Yolanda Peters MRN: [id number] Date of Birth: 1940/11/17 Referring Provider: Bo Merino, MD  Encounter Date: 11/16/2015      PT End of Session - 11/16/15 1105    Visit Number 7   Number of Visits 10   Date for PT Re-Evaluation 01-10-16   Authorization Type G-codes on visit 10   PT Start Time 1101   PT Stop Time 1141   PT Time Calculation (min) 40 min   Activity Tolerance Patient tolerated treatment well   Behavior During Therapy [facility] for tasks assessed/performed      Past Medical History:  Diagnosis Date  . Allergy   . Arthritis   . Cancer ([place])    right breast   . Hyperlipidemia    takes Niacin  . Hypertension     Past Surgical History:  Procedure Laterality Date  . BREAST DUCTAL SYSTEM EXCISION     right breast  . BREAST SURGERY  august 2011   right partial mastectomy  . CATARACT EXTRACTION     bilateral  . CESAREAN SECTION    . ELBOW SURGERY     left  . MYOMECTOMY      There were no vitals filed for this visit.      Subjective Assessment - 11/16/15 1103    Subjective Pt continues to have aching pain at night while sleeping. Less painful during the day   Pertinent History Breast cancer- no [place], arm bike or heat to shoulders   Diagnostic tests x-ray: today negative   Patient Stated Goals reduce Rt shoulder pain, perform water aerobics without pain, reduce pain with sleep at night   Currently in Pain? No/denies                         [facility] Adult PT Treatment/Exercise - 11/16/15 0001      Shoulder Exercises: Standing   External Rotation Strengthening;Right;20 reps;Theraband   Theraband Level (Shoulder External Rotation) Level 3 (Green)   Internal Rotation Strengthening;Right;20 reps;Theraband   Theraband Level (Shoulder  Internal Rotation) Level 3 (Green)   Flexion Strengthening;Right;Weights;20 reps  #3   ABduction Strengthening;Right;20 reps  #2   Extension Strengthening;Both;20 reps  power tower #25   Row Strengthening;Both;20 reps  power tower #25   Other Standing Exercises Serratus slide   Other Standing Exercises Curl press  #2     Shoulder Exercises: Body Blade   Flexion 60 seconds;2 reps  Red tball on wall     Modalities   Modalities Iontophoresis     Iontophoresis   Type of Iontophoresis Dexamethasone  #3   Location Rt deltoid   Dose 1 ML   Time 6 hour patch     Manual Therapy   Manual Therapy Soft tissue mobilization;Passive ROM   Manual therapy comments Pt seated  Rt deltoid   Soft tissue mobilization IASTM for mayofacial glide, soft tissue massage                  PT Short Term Goals - 11/16/15 1105      PT SHORT TERM GOAL #1   Title be independent in initial HEP   Time 4   Period Weeks   Status Achieved     PT SHORT TERM GOAL #2   Title report a 25% reduction in  Rt shoulder pain with sleep at night   Time 4   Period Weeks   Status Achieved  95%     PT SHORT TERM GOAL #3   Title perform water aerobics with modifcations without increase in Rt shohoulder pain   Time 4   Period Weeks   Status On-going  Has not been recently           PT Long Term Goals - 11/16/15 1107      PT LONG TERM GOAL #1   Title be independent in advanced HEP   Time 8   Period Weeks   Status On-going     PT LONG TERM GOAL #2   Title reduce FOTO to < or = to 29% limitation   Time 8   Period Weeks   Status On-going     PT LONG TERM GOAL #3   Title report a 60% reduction in Rt shouldre pain with sleep at night   Time 8   Period Weeks   Status Achieved  Pt reports 95% less pain     PT LONG TERM GOAL #4   Title perform water aerobics with Rt UE without modifications or increase in pain   Time 8   Period Days   Status On-going               Plan -  11/16/15 1145    Clinical Impression Statement Pt reports Rigth deltoid aching at night has improved by 95%. Pt able to complete all strengthening exercises well needing verbal cueing for technique. Pt will continue to benefit from skilled thereapy for UE strenghtening and stabilization.    Rehab Potential Good   PT Frequency 2x / week   PT Duration 8 weeks   PT Treatment/Interventions ADLs/Self Care Home Management;Cryotherapy;Electrical Stimulation;Iontophoresis 4mg /ml Dexamethasone;Functional mobility training;Therapeutic activities;Moist Heat;Therapeutic exercise;Neuromuscular re-education;Patient/family education;Passive range of motion;Manual techniques;Dry needling;Taping   PT Next Visit Plan Ionto #4 shoulder strenghtening, posture training.    Consulted and Agree with Plan of Care Patient      Patient will benefit from skilled therapeutic intervention in order to improve the following deficits and impairments:  Pain, Postural dysfunction, Decreased activity tolerance  Visit Diagnosis: Acute pain of right shoulder     Problem List Patient Active Problem List   Diagnosis Date Noted  . 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 Arthritis 02/24/2014  . HTN (hypertension) 02/24/2014  . Hyperlipidemia 02/24/2014  . Breast cancer, right, upper outer quadrant  07/05/2010    Mikle Bosworth PTA 11/16/2015, 11:48 AM  [facility] [address], [place], [place], [address] Phone: [phone]   Fax:  [phone]  Name: Yolanda Peters MRN: [id number] Date of Birth: Nov 05, 1940

## 2015-11-18 ENCOUNTER — Ambulatory Visit: Payer: Medicare Other | Admitting: Physical Therapy

## 2015-11-18 ENCOUNTER — Encounter: Payer: Self-pay | Admitting: Physical Therapy

## 2015-11-18 DIAGNOSIS — M25511 Pain in right shoulder: Secondary | ICD-10-CM | POA: Diagnosis not present

## 2015-11-18 DIAGNOSIS — R29898 Other symptoms and signs involving the musculoskeletal system: Secondary | ICD-10-CM

## 2015-11-18 NOTE — Therapy (Addendum)
[facility] [address], [place], [place], [address] Phone: [phone]   Fax:  [phone]  Physical Therapy Treatment  Patient Details  Peters: Yolanda Peters MRN: [id number] Date of Birth: 1940-12-16 Referring Provider: Geryl Rankins, MD  Encounter Date: 11/18/2015      PT End of Session - 11/18/15 1121    Visit Number 8   Number of Visits 10   Date for PT Re-Evaluation 16-Dec-2015   Authorization Type G-codes on visit 10   PT Start Time 1059   PT Stop Time 1130   PT Time Calculation (min) 31 min   Activity Tolerance Patient tolerated treatment well   Behavior During Therapy [facility] for tasks assessed/performed      Past Medical History:  Diagnosis Date  . Allergy   . Arthritis   . Cancer ([place])    right breast   . Hyperlipidemia    takes Niacin  . Hypertension     Past Surgical History:  Procedure Laterality Date  . BREAST DUCTAL SYSTEM EXCISION     right breast  . BREAST SURGERY  august 2011   right partial mastectomy  . CATARACT EXTRACTION     bilateral  . CESAREAN SECTION    . ELBOW SURGERY     left  . MYOMECTOMY      There were no vitals filed for this visit.      Subjective Assessment - 11/18/15 1104    Subjective Pt reports no pain at the moment and only and ache this morning.     Currently in Pain? No/denies            [facility] PT Assessment - 11/18/15 0001      Assessment   Medical Diagnosis Rt shoulder joint tendinopathy   Referring Provider Geryl Rankins, MD   Onset Date/Surgical Date 07/20/15   Hand Dominance Right   Next MD Visit 3 months   Prior Therapy none     Precautions   Precautions Other (comment)  breast cancer- no [place], arm bike or heat to shoulders     Restrictions   Weight Bearing Restrictions No     Balance Screen   Has the patient fallen in the past 6 months No   Has the patient had a decrease in activity level because of a fear of falling?  No   Is  the patient reluctant to leave their home because of a fear of falling?  No     Home Environment   Living Environment Private residence   Type of [place]     Prior Function   Level of Independence Independent   Vocation Retired   Leisure crafts, water aerobics     Cognition   Overall Cognitive Status Within Functional Limits for tasks assessed     Observation/Other Assessments   Focus on Therapeutic Outcomes (FOTO)  33% limitation     Posture/Postural Control   Posture/Postural Control Postural limitations   Postural Limitations Forward head;Rounded Shoulders     AROM   Overall AROM  Within functional limits for tasks performed   Overall AROM Comments full Rt shoulder AROM that is equal to the Lt.  Cervical AROM is WFLs without pain.  No pain with Rt shoulder A/ROM     PROM   Overall PROM  Within functional limits for tasks performed   Overall PROM Comments no pain at end range of Rt shoulder AROM.  Good glenohumeral mobility     Strength  Overall Strength Within functional limits for tasks performed   Overall Strength Comments 4+/5 bil. UE strength throughout.      Palpation   Palpation comment mild palpable tenderness at lateral deltoid insertion on the Rt.  Normal joint mobility of Rt glenohumeral joint     Special Tests    Special Tests Rotator Cuff Impingement;Biceps/Labral Tests   Rotator Cuff Impingment tests Empty Can test   Biceps/Labral tests Clunk Test     Empty Can test   Findings Negative   Side Right     Clunk Test   Findings Negative     Ambulation/Gait   Ambulation/Gait Yes   Ambulation/Gait Assistance 7: Independent   Gait Pattern Within Functional Limits                     [facility] Adult PT Treatment/Exercise - 11/18/15 0001      Shoulder Exercises: Supine   Horizontal ABduction Strengthening;Both;20 reps;Theraband   Theraband Level (Shoulder Horizontal ABduction) Level 2 (Red)  on foam roll   External Rotation  Strengthening;Both;20 reps;Theraband   Theraband Level (Shoulder External Rotation) Level 2 (Red)  on foam roll   Other Supine Exercises D2 extension 2x10  red band on foam roll     Shoulder Exercises: Seated   Flexion Strengthening;Right;20 reps;Weights   Flexion Weight (lbs) 2   Abduction Strengthening;20 reps;Weights   ABduction Weight (lbs) 2   ABduction Limitations scaption 2# 2x10     Shoulder Exercises: Standing   External Rotation Strengthening;Right;20 reps;Theraband   Theraband Level (Shoulder External Rotation) Level 3 (Green)   Internal Rotation Strengthening;Right;20 reps;Theraband   Theraband Level (Shoulder Internal Rotation) Level 3 (Green)   Flexion Strengthening;Right;Weights;20 reps  #3   ABduction Strengthening;Right;20 reps  #2   Extension Strengthening;Both;20 reps  power tower #25   Row Strengthening;Both;20 reps  power tower #25   Other Standing Exercises Curl press  #2     Modalities   Modalities Iontophoresis     Iontophoresis   Type of Iontophoresis Dexamethasone  #4   Location Rt deltoid   Dose 1 ML   Time 6 hour patch     Manual Therapy   Manual Therapy --                  PT Short Term Goals - 11/18/15 1106      PT SHORT TERM GOAL #1   Title be independent in initial HEP   Time 4   Period Weeks   Status Achieved     PT SHORT TERM GOAL #2   Title report a 25% reduction in Rt shoulder pain with sleep at night   Time 4   Period Weeks   Status Achieved     PT SHORT TERM GOAL #3   Title perform water aerobics with modifcations without increase in Rt shohoulder pain   Time 4   Period Weeks   Status Partially Met  Pt has not been to water aerobics recently           PT Long Term Goals - 11/18/15 1106      PT LONG TERM GOAL #1   Title be independent in advanced HEP   Time 8   Period Weeks   Status Achieved     PT LONG TERM GOAL #2   Title reduce FOTO to < or = to 29% limitation   Time 8   Period Weeks    Status Not Met  33%  PT LONG TERM GOAL #3   Title report a 60% reduction in Rt shouldre pain with sleep at night   Time 8   Period Weeks   Status Achieved     PT LONG TERM GOAL #4   Title perform water aerobics with Rt UE without modifications or increase in pain   Time 8   Period Days   Status Partially Met  Pt has not been to water aeriobics recently               Plan - 12/07/15 1122    Clinical Impression Statement Pt request to discontinue therapy today. Pt has met most short term and long term goals. FOTO score indicates 33% limitations. Pt has not been to water aerobics recently but reports she will be using regualr weights. Pt is plesaed with progress and is ready to continue to progress independently.       Patient will benefit from skilled therapeutic intervention in order to improve the following deficits and impairments:     Visit Diagnosis: Acute pain of right shoulder  Right leg weakness       G-Codes - 07-Dec-2015 1126    Functional Assessment Tool Used FOTO: 33% limitation   Functional Limitation Carrying, moving and handling objects   Carrying, Moving and Handling Objects Goal Status ([id number]) At least 20 percent but less than 40 percent impaired, limited or restricted   Carrying, Moving and Handling Objects Discharge Status [id number]) At least 20 percent but less than 40 percent impaired, limited or restricted      Problem List Patient Active Problem List   Diagnosis Date Noted  . 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 Arthritis 02/24/2014  . HTN (hypertension) 02/24/2014  . Hyperlipidemia 02/24/2014  . Breast cancer, right, upper outer quadrant  07/05/2010    Mikle Bosworth, PTA December 07, 2015 11:32 AM  PHYSICAL THERAPY DISCHARGE SUMMARY  Visits from Start of Care: 8  Current functional level related to goals / functional outcomes: Pt attended 8 PT sessions and will discharge to HEP.  Pt will continue with exercises for continued  gains.     Remaining deficits: No significant deficits remain.     Education / Equipment: HEP Plan: Patient agrees to discharge.  Patient goals were partially met. Patient is being discharged due to being pleased with the current functional level.  ?????        Sigurd Sos, PT 2015/12/07 11:37 AM   [facility] [address] [place], [place], [address] Phone: [phone]   Fax:  [phone]  Peters: Yolanda Peters MRN: [id number] Date of Birth: May 19, 1940

## 2015-11-23 ENCOUNTER — Encounter: Payer: Medicare Other | Admitting: Physical Therapy

## 2015-11-25 ENCOUNTER — Ambulatory Visit: Payer: Self-pay

## 2015-11-30 ENCOUNTER — Encounter: Payer: Medicare Other | Admitting: Physical Therapy

## 2016-01-13 ENCOUNTER — Other Ambulatory Visit: Payer: Self-pay | Admitting: *Deleted

## 2016-01-13 DIAGNOSIS — Z79899 Other long term (current) drug therapy: Secondary | ICD-10-CM

## 2016-01-13 DIAGNOSIS — M2550 Pain in unspecified joint: Secondary | ICD-10-CM

## 2016-01-13 LAB — CBC WITH DIFFERENTIAL/PLATELET
Basophils Absolute: 51 cells/uL (ref 0–200)
Basophils Relative: 1 %
Eosinophils Absolute: 204 cells/uL (ref 15–500)
Eosinophils Relative: 4 %
HCT: 45.5 % — ABNORMAL HIGH (ref 35.0–45.0)
Hemoglobin: 14.6 g/dL (ref 11.7–15.5)
Lymphocytes Relative: 29 %
Lymphs Abs: 1479 cells/uL (ref 850–3900)
MCH: 27.6 pg (ref 27.0–33.0)
MCHC: 32.1 g/dL (ref 32.0–36.0)
MCV: 86 fL (ref 80.0–100.0)
MPV: 9.7 fL (ref 7.5–12.5)
Monocytes Absolute: 561 cells/uL (ref 200–950)
Monocytes Relative: 11 %
Neutro Abs: 2805 cells/uL (ref 1500–7800)
Neutrophils Relative %: 55 %
Platelets: 244 10*3/uL (ref 140–400)
RBC: 5.29 MIL/uL — ABNORMAL HIGH (ref 3.80–5.10)
RDW: 14.8 % (ref 11.0–15.0)
WBC: 5.1 10*3/uL (ref 3.8–10.8)

## 2016-01-14 LAB — COMPLETE METABOLIC PANEL WITH GFR
ALT: 22 U/L (ref 6–29)
AST: 21 U/L (ref 10–35)
Albumin: 4.2 g/dL (ref 3.6–5.1)
Alkaline Phosphatase: 88 U/L (ref 33–130)
BUN: 20 mg/dL (ref 7–25)
CO2: 26 mmol/L (ref 20–31)
Calcium: 9 mg/dL (ref 8.6–10.4)
Chloride: 104 mmol/L (ref 98–110)
Creat: 0.9 mg/dL (ref 0.60–0.93)
GFR, Est African American: 73 mL/min (ref >=60)
GFR, Est Non African American: 63 mL/min (ref >=60)
Glucose, Bld: 72 mg/dL (ref 65–99)
Potassium: 4.3 mmol/L (ref 3.5–5.3)
Sodium: 142 mmol/L (ref 135–146)
Total Bilirubin: 1.2 mg/dL (ref 0.2–1.2)
Total Protein: 6.4 g/dL (ref 6.1–8.1)

## 2016-01-14 LAB — ANA: Anti Nuclear Antibody(ANA): POSITIVE — AB

## 2016-01-14 LAB — ANTI-NUCLEAR AB-TITER (ANA TITER): ANA Titer 1: 1:160 {titer} — ABNORMAL HIGH

## 2016-01-14 LAB — RHEUMATOID FACTOR: Rhuematoid fact SerPl-aCnc: <14 IU/mL (ref <14)

## 2016-02-18 ENCOUNTER — Telehealth: Payer: Self-pay | Admitting: Rheumatology

## 2016-02-18 NOTE — Telephone Encounter (Signed)
Patient would like to begin the Hyalgan injection series. Please advise.

## 2016-03-15 ENCOUNTER — Ambulatory Visit (INDEPENDENT_AMBULATORY_CARE_PROVIDER_SITE_OTHER): Payer: Medicare Other | Admitting: Rheumatology

## 2016-03-15 ENCOUNTER — Encounter: Payer: Self-pay | Admitting: Rheumatology

## 2016-03-15 VITALS — BP 133/80 | HR 88 | Resp 13 | Ht 65.5 in

## 2016-03-15 DIAGNOSIS — M25551 Pain in right hip: Secondary | ICD-10-CM | POA: Diagnosis not present

## 2016-03-15 DIAGNOSIS — Z79899 Other long term (current) drug therapy: Secondary | ICD-10-CM | POA: Diagnosis not present

## 2016-03-15 DIAGNOSIS — M25511 Pain in right shoulder: Secondary | ICD-10-CM | POA: Diagnosis not present

## 2016-03-15 DIAGNOSIS — M25552 Pain in left hip: Secondary | ICD-10-CM

## 2016-03-15 DIAGNOSIS — M25561 Pain in right knee: Secondary | ICD-10-CM

## 2016-03-15 DIAGNOSIS — M359 Systemic involvement of connective tissue, unspecified: Secondary | ICD-10-CM | POA: Diagnosis not present

## 2016-03-15 DIAGNOSIS — M25562 Pain in left knee: Secondary | ICD-10-CM | POA: Diagnosis not present

## 2016-03-15 DIAGNOSIS — M170 Bilateral primary osteoarthritis of knee: Secondary | ICD-10-CM | POA: Insufficient documentation

## 2016-03-15 DIAGNOSIS — G8929 Other chronic pain: Secondary | ICD-10-CM | POA: Diagnosis not present

## 2016-03-15 MED ORDER — TRIAMCINOLONE ACETONIDE 40 MG/ML IJ SUSP
40.0000 mg | INTRAMUSCULAR | Status: AC | PRN
  Administered 2016-03-15: 40 mg via INTRA_ARTICULAR

## 2016-03-15 MED ORDER — LIDOCAINE HCL 1 % IJ SOLN
1.5000 mL | INTRAMUSCULAR | Status: AC | PRN
  Administered 2016-03-15: 1.5 mL

## 2016-03-15 NOTE — Progress Notes (Signed)
Office Visit Note  Patient: Yolanda Peters             Date of Birth: January 19, 1940           MRN: [id number]             PCP: Precious Reel, MD Referring: Shon Baton, MD Visit Date: 03/15/2016 Occupation: _0 @    Subjective:  Bialteral knee pain (Would like a cort. injection) Having flare of knee pain to right knee  History of Present Illness: Yolanda Peters is a 76 y.o. female  Patient is having right knee pain and would like to get a cortisone injection today. Otherwise she is doing well with her autoimmune disease and compliant with her medication.   Activities of Daily Living:  Patient reports morning stiffness for 15 minute.   Patient Denies nocturnal pain.  Difficulty dressing/grooming: Denies Difficulty climbing stairs: Denies Difficulty getting out of chair: Denies Difficulty using hands for taps, buttons, cutlery, and/or writing: Denies   Review of Systems  Constitutional: Negative for fatigue.  HENT: Negative for mouth sores and mouth dryness.   Eyes: Negative for dryness.  Respiratory: Negative for shortness of breath.   Gastrointestinal: Negative for constipation and diarrhea.  Musculoskeletal: Negative for myalgias and myalgias.  Skin: Negative for sensitivity to sunlight.  Psychiatric/Behavioral: Negative for decreased concentration and sleep disturbance.    PMFS History:  Patient Active Problem List   Diagnosis Date Noted  . Autoimmune disease ([place]) 03/15/2016  . High risk medication use 03/15/2016  . Pain in joint of right shoulder 03/15/2016  . Bilateral hip pain 03/15/2016  . Primary osteoarthritis of both knees 03/15/2016  . 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 Arthritis 02/24/2014  . HTN (hypertension) 02/24/2014  . Hyperlipidemia 02/24/2014  . Breast cancer, right, upper outer quadrant  07/05/2010    Past Medical History:  Diagnosis Date  . Allergy   . Arthritis   . Cancer ([place])    right breast   . Hyperlipidemia    takes  Niacin  . Hypertension     Family History  Problem Relation Age of Onset  . Cancer Mother     bladder  . Cancer Father     leukemia  . Cancer Maternal Aunt     breast  . Colon cancer Paternal Aunt   . Colon cancer Cousin   . Esophageal cancer Neg Hx   . Rectal cancer Neg Hx   . Stomach cancer Neg Hx    Past Surgical History:  Procedure Laterality Date  . BREAST DUCTAL SYSTEM EXCISION     right breast  . BREAST SURGERY  august 2011   right partial mastectomy  . CATARACT EXTRACTION     bilateral  . CESAREAN SECTION    . ELBOW SURGERY     left  . MYOMECTOMY     Social History   Social History Narrative  . No narrative on file     Objective: Vital Signs: BP 133/80   Pulse 88   Resp 13   Ht 5' 5.5" (1.664 m)    Physical Exam  Constitutional: She is oriented to person, place, and time. She appears well-developed and well-nourished.  HENT:  Head: Normocephalic and atraumatic.  Eyes: EOM are normal. Pupils are equal, round, and reactive to light.  Cardiovascular: Normal rate, regular rhythm and normal heart sounds.  Exam reveals no gallop and no friction rub.   No murmur heard. Pulmonary/Chest: Effort normal and breath sounds normal.  She has no wheezes. She has no rales.  Abdominal: Soft. Bowel sounds are normal. She exhibits no distension. There is no tenderness. There is no guarding. No hernia.  Musculoskeletal: Normal range of motion. She exhibits no edema, tenderness or deformity.  Lymphadenopathy:    She has no cervical adenopathy.  Neurological: She is alert and oriented to person, place, and time. Coordination normal.  Skin: Skin is warm and dry. Capillary refill takes less than 2 seconds. No rash noted.  Psychiatric: She has a normal mood and affect. Her behavior is normal.  Nursing note and vitals reviewed.    Musculoskeletal Exam:  Full range of motion of all joints Grip strength is equal and strong bilaterally Fiber myalgia tender points are  absent  CDAI Exam: No CDAI exam completed.  No synovitis on examination  Investigation: Findings:  03/2015 normal PLQ eye exam , due again March 2018.  Labs from September 07, 2015, show CMP with GFR normal.  CBC with diff is normal.  X-ray of the right shoulder joint, 3 views, shows mild joint space narrowing.  Some mild spurring noted.     Orders Only on 01/13/2016  Component Date Value Ref Range Status  . WBC 01/13/2016 5.1  3.8 - 10.8 K/uL Final  . RBC 01/13/2016 5.29* 3.80 - 5.10 MIL/uL Final  . Hemoglobin 01/13/2016 14.6  11.7 - 15.5 g/dL Final  . HCT 67/24/0844 45.5* 35.0 - 45.0 % Final  . MCV 01/13/2016 86.0  80.0 - 100.0 fL Final  . MCH 01/13/2016 27.6  27.0 - 33.0 pg Final  . MCHC 01/13/2016 32.1  32.0 - 36.0 g/dL Final  . RDW 85/31/7418 14.8  11.0 - 15.0 % Final  . Platelets 01/13/2016 244  140 - 400 K/uL Final  . MPV 01/13/2016 9.7  7.5 - 12.5 fL Final  . Neutro Abs 01/13/2016 2805  1,500 - 7,800 cells/uL Final  . Lymphs Abs 01/13/2016 1479  850 - 3,900 cells/uL Final  . Monocytes Absolute 01/13/2016 561  200 - 950 cells/uL Final  . Eosinophils Absolute 01/13/2016 204  15 - 500 cells/uL Final  . Basophils Absolute 01/13/2016 51  0 - 200 cells/uL Final  . Neutrophils Relative % 01/13/2016 55  % Final  . Lymphocytes Relative 01/13/2016 29  % Final  . Monocytes Relative 01/13/2016 11  % Final  . Eosinophils Relative 01/13/2016 4  % Final  . Basophils Relative 01/13/2016 1  % Final  . Smear Review 01/13/2016 Criteria for review not met   Final  . Sodium 01/13/2016 142  135 - 146 mmol/L Final  . Potassium 01/13/2016 4.3  3.5 - 5.3 mmol/L Final  . Chloride 01/13/2016 104  98 - 110 mmol/L Final  . CO2 01/13/2016 26  20 - 31 mmol/L Final  . Glucose, Bld 01/13/2016 72  65 - 99 mg/dL Final  . BUN 73/03/254 20  7 - 25 mg/dL Final  . Creat 86/23/9574 0.90  0.60 - 0.93 mg/dL Final   Comment:   For patients > or = 75 years of age: The upper reference limit for Creatinine is  approximately 13% higher for people identified as African-American.     . Total Bilirubin 01/13/2016 1.2  0.2 - 1.2 mg/dL Final  . Alkaline Phosphatase 01/13/2016 88  33 - 130 U/L Final  . AST 01/13/2016 21  10 - 35 U/L Final  . ALT 01/13/2016 22  6 - 29 U/L Final  . Total Protein 01/13/2016 6.4  6.1 - 8.1  g/dL Final  . Albumin 01/13/2016 4.2  3.6 - 5.1 g/dL Final  . Calcium 01/13/2016 9.0  8.6 - 10.4 mg/dL Final  . GFR, Est African American 01/13/2016 73  >=60 mL/min Final  . GFR, Est Non African American 01/13/2016 63  >=60 mL/min Final  . Anit Nuclear Antibody(ANA) 01/13/2016 POS* NEGATIVE Final  . Rhuematoid fact SerPl-aCnc 01/13/2016 <14  <14 IU/mL Final  . ANA Pattern 1 01/13/2016 HOMOGENEOUS*  Final  . ANA Titer 1 01/13/2016 1:160* titer Final   Comment:           Reference Range           < 1:40      Negative             1:40-1:80 Low Antibody level           > 1:80      Elevated Antibody level       Imaging: No results found.  Speciality Comments: No specialty comments available.    Procedures:  Large Joint Inj Date/Time: 03/15/2016 5:33 PM Performed by: Eliezer Lofts Authorized by: Eliezer Lofts   Consent Given by:  Patient Site marked: the procedure site was marked   Timeout: prior to procedure the correct patient, procedure, and site was verified   Indications:  Pain and joint swelling Location:  Knee Site:  R knee Prep: patient was prepped and draped in usual sterile fashion   Needle Size:  27 G Needle Length:  1.5 inches Approach:  Medial Ultrasound Guidance: No   Fluoroscopic Guidance: No   Arthrogram: No   Medications:  1.5 mL lidocaine 1 %; 40 mg triamcinolone acetonide 40 MG/ML Aspiration Attempted: Yes   Patient tolerance:  Patient tolerated the procedure well with no immediate complications  Patient had a pain rated as 7-8 on a scale of 0-10 prior to injection. After the injection of the right knee, her pain went down to 3 on a scale of  0-10   Allergies: Penicillins   Assessment / Plan:     Visit Diagnoses: Autoimmune disease ([place]) - +ANA with low titer.  High risk medication use - PLQ-223m B.I.D Monday through Friday   Pain in joint of right shoulder  Bilateral hip pain  Primary osteoarthritis of both knees - S/P [id number]   Plan: #1: Autoimmune disease. Doing well. Except having some pain to bilateral knees. This may be osteoarthritis but patient has been only taking Plaquenil 200 mg once a day instead of twice a day Monday through Friday. I've advised the patient to do 1 pill twice a day Monday through Friday and she is agreeable.  #2 bIlateral knee joint pain. Right is worse than the left. She is requesting a cortisone injection. See procedure note for full details. The right knee joint was injected with 40 mg of Kenalog mixed with one half mL's 1% lidocaine without epinephrine.  #3 Advised pt to use plq 2029mbid m-f instead of her current dose of plq 20072m-f.  #4 rtc 5 mos.  #5 Cancel April appt.    Orders: Orders Placed This Encounter  Procedures  . Large Joint Injection/Arthrocentesis   No orders of the defined types were placed in this encounter.   Face-to-face time spent with patient was 30 minutes. 50% of time was spent in counseling and coordination of care.  Follow-Up Instructions: No Follow-up on file.   NaiEliezer LoftsA-C  Note - This record has been created using Dra[employer]Chart creation  errors have been sought, but may not always  have been located. Such creation errors do not reflect on  the standard of medical care.

## 2016-03-16 ENCOUNTER — Other Ambulatory Visit: Payer: Self-pay | Admitting: Rheumatology

## 2016-03-16 MED ORDER — HYDROXYCHLOROQUINE SULFATE 200 MG PO TABS: ORAL_TABLET | ORAL | 1 refills | Status: DC

## 2016-03-16 NOTE — Telephone Encounter (Signed)
Last Visit: 03/15/16 Next visit: 09/20/16 Labs: 01/13/16 Stable PLQ Eye Exam 03/17 WNL Scheduled to update this month  Okay to refill PLQ?

## 2016-03-16 NOTE — Telephone Encounter (Signed)
Ok to refill it but, I saw patient yesterday and I thought we refilled it then.

## 2016-03-16 NOTE — Telephone Encounter (Signed)
Patient called requesting a refill on her PLQ sent to [facility] for a 90-day supply.  VX#[phone].  Thank you.

## 2016-03-25 ENCOUNTER — Ambulatory Visit: Payer: Medicare Other | Admitting: Rheumatology

## 2016-04-19 ENCOUNTER — Ambulatory Visit: Payer: Medicare Other | Admitting: Rheumatology

## 2016-05-04 ENCOUNTER — Telehealth: Payer: Self-pay | Admitting: Rheumatology

## 2016-05-04 NOTE — Telephone Encounter (Signed)
Patient called to check status of Euflexxa injections. Please advise.

## 2016-05-24 ENCOUNTER — Telehealth (INDEPENDENT_AMBULATORY_CARE_PROVIDER_SITE_OTHER): Payer: Self-pay | Admitting: Radiology

## 2016-05-24 NOTE — Telephone Encounter (Signed)
Shona Needles, RT  Hien Cunliffe, RT        new   Previous Messages    ----- Message -----  From: Eliezer Lofts, PA-C  Sent: 03/15/2016  5:30 PM  To: Shona Needles, RT   Apply for Euflex 3 bilateral knees.  Hyalgan or Orthovisc also acceptable   6 months from the last injection of 10/29/2015 will be after 04/28/2016.   So we can start the injections in late April 2018       Patient called today and LMVM for me about this.  She was upset that this has not been done.  I explained our hardship to her and that we are trying to correct and get ahead on these injection applications.  I have now faxed for her VOB from [place], and will call her to discuss further once I receive it.  She understands.

## 2016-05-25 NOTE — Telephone Encounter (Signed)
FYI - Benefits for Euflexxa received, buy and bill.

## 2016-05-25 NOTE — Telephone Encounter (Signed)
Patient is aware primary ins pays 80% of cost Euflexxa and secondary pays 85% of remaining.  She has appts scheduled for these injections already.

## 2016-05-25 NOTE — Telephone Encounter (Signed)
Please schedule Euflexxa Bil x 3 with Gilmer Mor on Monday or Wednesday afternoons. Thank you.

## 2016-06-22 ENCOUNTER — Encounter: Payer: Self-pay | Admitting: Rheumatology

## 2016-06-22 ENCOUNTER — Ambulatory Visit (INDEPENDENT_AMBULATORY_CARE_PROVIDER_SITE_OTHER): Payer: Medicare Other | Admitting: Rheumatology

## 2016-06-22 DIAGNOSIS — G8929 Other chronic pain: Secondary | ICD-10-CM

## 2016-06-22 DIAGNOSIS — M25562 Pain in left knee: Secondary | ICD-10-CM | POA: Diagnosis not present

## 2016-06-22 DIAGNOSIS — M1712 Unilateral primary osteoarthritis, left knee: Secondary | ICD-10-CM

## 2016-06-22 DIAGNOSIS — M1711 Unilateral primary osteoarthritis, right knee: Secondary | ICD-10-CM

## 2016-06-22 DIAGNOSIS — M25561 Pain in right knee: Secondary | ICD-10-CM | POA: Diagnosis not present

## 2016-06-22 DIAGNOSIS — M170 Bilateral primary osteoarthritis of knee: Secondary | ICD-10-CM

## 2016-06-22 MED ORDER — LIDOCAINE HCL (PF) 1 % IJ SOLN
2.0000 mL | INTRAMUSCULAR | Status: AC | PRN
  Administered 2016-06-22: 2 mL

## 2016-06-22 MED ORDER — SODIUM HYALURONATE (VISCOSUP) 20 MG/2ML IX SOSY
20.0000 mg | PREFILLED_SYRINGE | INTRA_ARTICULAR | Status: AC | PRN
  Administered 2016-06-22: 20 mg via INTRA_ARTICULAR

## 2016-06-22 NOTE — Progress Notes (Signed)
   Procedure Note  Patient: Yolanda Peters             Date of Birth: Jan 12, 1940           MRN: [id number]             Visit Date: 06/22/2016  Procedures: Visit Diagnoses: Bilateral chronic knee pain  Primary osteoarthritis of both knees  Large Joint Inj Date/Time: 06/22/2016 9:17 AM Performed by: Eliezer Lofts Authorized by: Eliezer Lofts   Consent Given by:  Patient Site marked: the procedure site was marked   Timeout: prior to procedure the correct patient, procedure, and site was verified   Indications:  Pain and joint swelling Location:  Knee Site:  R knee Prep: patient was prepped and draped in usual sterile fashion   Needle Size:  27 G Needle Length:  1.5 inches Approach:  Medial Ultrasound Guidance: No   Fluoroscopic Guidance: No   Arthrogram: No   Medications:  2 mL lidocaine (PF) 1 %; 20 mg Sodium Hyaluronate 20 MG/2ML Aspiration Attempted: Yes   Aspirate amount (mL):  0 Patient tolerance:  Patient tolerated the procedure well with no immediate complications  Euflexxa No. 1 bilateral knees Large Joint Inj Date/Time: 06/22/2016 9:17 AM Performed by: Eliezer Lofts Authorized by: Eliezer Lofts   Consent Given by:  Patient Site marked: the procedure site was marked   Timeout: prior to procedure the correct patient, procedure, and site was verified   Indications:  Pain and joint swelling Location:  Knee Site:  L knee Prep: patient was prepped and draped in usual sterile fashion   Needle Size:  27 G Needle Length:  1.5 inches Approach:  Medial Ultrasound Guidance: No   Fluoroscopic Guidance: No   Arthrogram: No   Medications:  2 mL lidocaine (PF) 1 %; 20 mg Sodium Hyaluronate 20 MG/2ML Aspiration Attempted: Yes   Aspirate amount (mL):  0 Patient tolerance:  Patient tolerated the procedure well with no immediate complications  Euflexxa No. 1 bilateral knees

## 2016-06-29 ENCOUNTER — Encounter: Payer: Self-pay | Admitting: Rheumatology

## 2016-06-29 ENCOUNTER — Ambulatory Visit: Payer: Medicare Other | Admitting: Rheumatology

## 2016-06-29 DIAGNOSIS — M25561 Pain in right knee: Secondary | ICD-10-CM

## 2016-06-29 DIAGNOSIS — M170 Bilateral primary osteoarthritis of knee: Secondary | ICD-10-CM

## 2016-06-29 DIAGNOSIS — M25562 Pain in left knee: Secondary | ICD-10-CM

## 2016-06-29 DIAGNOSIS — G8929 Other chronic pain: Secondary | ICD-10-CM

## 2016-06-29 MED ORDER — SODIUM HYALURONATE (VISCOSUP) 20 MG/2ML IX SOSY
20.0000 mg | PREFILLED_SYRINGE | INTRA_ARTICULAR | Status: AC | PRN
  Administered 2016-06-29: 20 mg via INTRA_ARTICULAR

## 2016-06-29 MED ORDER — LIDOCAINE HCL 1 % IJ SOLN
1.5000 mL | INTRAMUSCULAR | Status: AC | PRN
  Administered 2016-06-29: 1.5 mL

## 2016-06-29 NOTE — Progress Notes (Signed)
   Procedure Note  Patient: Yolanda Peters             Date of Birth: 02/06/1940           MRN: [id number]             Visit Date: 06/29/2016  Procedures: Visit Diagnoses: Primary osteoarthritis of both knees  Bilateral chronic knee pain  Large Joint Inj Date/Time: 06/29/2016 12:24 PM Performed by: Eliezer Lofts Authorized by: Eliezer Lofts   Consent Given by:  Patient Site marked: the procedure site was marked   Timeout: prior to procedure the correct patient, procedure, and site was verified   Indications:  Pain and joint swelling Location:  Knee Site:  R knee Prep: patient was prepped and draped in usual sterile fashion   Needle Size:  27 G Needle Length:  1.5 inches Approach:  Medial Ultrasound Guidance: No   Fluoroscopic Guidance: No   Arthrogram: No   Medications:  1.5 mL lidocaine 1 %; 20 mg Sodium Hyaluronate 20 MG/2ML Aspiration Attempted: Yes   Patient tolerance:  Patient tolerated the procedure well with no immediate complications  The Euflexxa is purchased by our office We will bill patient  Euflexxa No. 2 bilateral knees Large Joint Inj Date/Time: 06/29/2016 12:26 PM Performed by: Eliezer Lofts Authorized by: Eliezer Lofts   Consent Given by:  Patient Site marked: the procedure site was marked   Timeout: prior to procedure the correct patient, procedure, and site was verified   Indications:  Pain and joint swelling Location:  Knee Site:  L knee Prep: patient was prepped and draped in usual sterile fashion   Needle Size:  27 G Needle Length:  1.5 inches Approach:  Medial Ultrasound Guidance: No   Fluoroscopic Guidance: No   Arthrogram: No   Medications:  1.5 mL lidocaine 1 %; 20 mg Sodium Hyaluronate 20 MG/2ML Aspiration Attempted: Yes   Patient tolerance:  Patient tolerated the procedure well with no immediate complications  The Euflexxa is purchased by our office We will bill patient  Euflexxa No. 2 bilateral knees

## 2016-07-04 NOTE — Progress Notes (Signed)
   Procedure Note  Patient: Yolanda Peters             Date of Birth: August 07, 1940           MRN: [id number]             Visit Date: 07/06/2016  Procedures: Visit Diagnoses: Primary osteoarthritis of both knees - Plan: Large Joint Injection/Arthrocentesis, Large Joint Injection/Arthrocentesis Patient was here to receive her third reflex injection to bilateral knee joints Large Joint Inj Date/Time: 07/06/2016 10:12 AM Performed by: Bo Merino Authorized by: Bo Merino   Consent Given by:  Patient Site marked: the procedure site was marked   Timeout: prior to procedure the correct patient, procedure, and site was verified   Indications:  Pain and joint swelling Location:  Knee Site:  R knee Prep: patient was prepped and draped in usual sterile fashion   Needle Size:  27 G Needle Length:  1.5 inches Approach:  Medial Ultrasound Guidance: No   Fluoroscopic Guidance: No   Arthrogram: No   Aspiration Attempted: Yes   Patient tolerance:  Patient tolerated the procedure well with no immediate complications Large Joint Inj Date/Time: 07/06/2016 10:13 AM Performed by: Bo Merino Authorized by: Bo Merino   Consent Given by:  Patient Site marked: the procedure site was marked   Timeout: prior to procedure the correct patient, procedure, and site was verified   Indications:  Pain and joint swelling Location:  Knee Prep: patient was prepped and draped in usual sterile fashion   Needle Size:  27 G Needle Length:  1.5 inches Approach:  Medial Ultrasound Guidance: No   Fluoroscopic Guidance: No   Arthrogram: No   Aspiration Attempted: Yes   Patient tolerance:  Patient tolerated the procedure well with no immediate complications   Bo Merino, MD

## 2016-07-06 ENCOUNTER — Encounter: Payer: Self-pay | Admitting: Rheumatology

## 2016-07-06 ENCOUNTER — Ambulatory Visit (INDEPENDENT_AMBULATORY_CARE_PROVIDER_SITE_OTHER): Payer: Medicare Other | Admitting: Rheumatology

## 2016-07-06 DIAGNOSIS — M1711 Unilateral primary osteoarthritis, right knee: Secondary | ICD-10-CM

## 2016-07-06 DIAGNOSIS — M170 Bilateral primary osteoarthritis of knee: Secondary | ICD-10-CM

## 2016-07-06 DIAGNOSIS — M1712 Unilateral primary osteoarthritis, left knee: Secondary | ICD-10-CM

## 2016-08-09 ENCOUNTER — Other Ambulatory Visit: Payer: Self-pay | Admitting: Rheumatology

## 2016-08-09 NOTE — Telephone Encounter (Signed)
Last Visit: 03/15/16  Next Visit: 09/20/16 Labs: 01/13/16 stable  PLQ Eye Exam: 04/14/16 WNL   Left message to advise patient she is due for labs.   Okay to refill 30 supply PLQ?

## 2016-08-09 NOTE — Telephone Encounter (Signed)
ok 

## 2016-08-17 ENCOUNTER — Other Ambulatory Visit: Payer: Self-pay | Admitting: Rheumatology

## 2016-08-17 LAB — CBC WITH DIFFERENTIAL/PLATELET
Basophils Absolute: 0 cells/uL (ref 0–200)
Basophils Relative: 0 %
Eosinophils Absolute: 207 cells/uL (ref 15–500)
Eosinophils Relative: 3 %
HCT: 43.1 % (ref 35.0–45.0)
Hemoglobin: 14.4 g/dL (ref 11.7–15.5)
Lymphocytes Relative: 24 %
Lymphs Abs: 1656 cells/uL (ref 850–3900)
MCH: 29 pg (ref 27.0–33.0)
MCHC: 33.4 g/dL (ref 32.0–36.0)
MCV: 86.9 fL (ref 80.0–100.0)
MPV: 9.9 fL (ref 7.5–12.5)
Monocytes Absolute: 759 cells/uL (ref 200–950)
Monocytes Relative: 11 %
Neutro Abs: 4278 cells/uL (ref 1500–7800)
Neutrophils Relative %: 62 %
Platelets: 274 10*3/uL (ref 140–400)
RBC: 4.96 MIL/uL (ref 3.80–5.10)
RDW: 14.5 % (ref 11.0–15.0)
WBC: 6.9 10*3/uL (ref 3.8–10.8)

## 2016-08-18 LAB — COMPLETE METABOLIC PANEL WITH GFR
ALT: 23 U/L (ref 6–29)
AST: 20 U/L (ref 10–35)
Albumin: 4.2 g/dL (ref 3.6–5.1)
Alkaline Phosphatase: 87 U/L (ref 33–130)
BUN: 28 mg/dL — ABNORMAL HIGH (ref 7–25)
CO2: 24 mmol/L (ref 20–32)
Calcium: 9.4 mg/dL (ref 8.6–10.4)
Chloride: 104 mmol/L (ref 98–110)
Creat: 1.03 mg/dL — ABNORMAL HIGH (ref 0.60–0.93)
GFR, Est African American: 61 mL/min (ref >=60)
GFR, Est Non African American: 53 mL/min — ABNORMAL LOW (ref >=60)
Glucose, Bld: 91 mg/dL (ref 65–99)
Potassium: 4.7 mmol/L (ref 3.5–5.3)
Sodium: 142 mmol/L (ref 135–146)
Total Bilirubin: 0.7 mg/dL (ref 0.2–1.2)
Total Protein: 6.3 g/dL (ref 6.1–8.1)

## 2016-08-18 NOTE — Progress Notes (Signed)
Mild elevation of creatinine probably due to HCTZ use

## 2016-08-24 ENCOUNTER — Encounter: Payer: Self-pay | Admitting: Rheumatology

## 2016-08-24 ENCOUNTER — Ambulatory Visit (INDEPENDENT_AMBULATORY_CARE_PROVIDER_SITE_OTHER): Payer: Medicare Other | Admitting: Rheumatology

## 2016-08-24 VITALS — BP 165/87 | HR 75 | Ht 65.0 in | Wt 168.0 lb

## 2016-08-24 DIAGNOSIS — M25551 Pain in right hip: Secondary | ICD-10-CM

## 2016-08-24 DIAGNOSIS — Z79899 Other long term (current) drug therapy: Secondary | ICD-10-CM

## 2016-08-24 DIAGNOSIS — D8989 Other specified disorders involving the immune mechanism, not elsewhere classified: Secondary | ICD-10-CM | POA: Diagnosis not present

## 2016-08-24 DIAGNOSIS — M359 Systemic involvement of connective tissue, unspecified: Secondary | ICD-10-CM

## 2016-08-24 DIAGNOSIS — M25552 Pain in left hip: Secondary | ICD-10-CM | POA: Diagnosis not present

## 2016-08-24 DIAGNOSIS — I10 Essential (primary) hypertension: Secondary | ICD-10-CM | POA: Diagnosis not present

## 2016-08-24 MED ORDER — LIDOCAINE HCL 1 % IJ SOLN
1.5000 mL | INTRAMUSCULAR | Status: AC | PRN
  Administered 2016-08-24: 1.5 mL

## 2016-08-24 MED ORDER — TRIAMCINOLONE ACETONIDE 40 MG/ML IJ SUSP
40.0000 mg | INTRAMUSCULAR | Status: AC | PRN
  Administered 2016-08-24: 40 mg via INTRA_ARTICULAR

## 2016-08-24 NOTE — Patient Instructions (Signed)
Iliotibial Bursitis Rehab Ask your health care provider which exercises are safe for you. Do exercises exactly as told by your health care provider and adjust them as directed. It is normal to feel mild stretching, pulling, tightness, or discomfort as you do these exercises, but you should stop right away if you feel sudden pain or your pain gets worse.Do not begin these exercises until told by your health care provider. Stretching and range of motion exercises These exercises warm up your muscles and joints and improve the movement and flexibility of your leg. These exercises also help to relieve pain and stiffness. Exercise A: Quadriceps stretch, prone  1. Lie on your abdomen on a firm surface, such as a bed or padded floor. 2. Bend your left / right knee and hold your ankle. If you cannot reach your ankle or pant leg, loop a belt around your foot and grab the belt instead. 3. Gently pull your heel toward your buttocks. Your knee should not slide out to the side. You should feel a stretch in the front of your thigh and knee. 4. Hold this position for __________ seconds. Repeat __________ times. Complete this exercise __________ times a day. Exercise B: Lunge ( adductor stretch) 1. Stand and spread your legs about 3 feet (about 1 m) apart. Put your left / right leg slightly back for balance. 2. Lean away from your left / right leg by bending your other knee and shifting your weight toward your bent knee. You may rest your hands on your thigh for balance. You should feel a stretch in your left / right inner thigh. 3. Hold for __________ seconds. Repeat __________ times. Complete this exercise __________ times a day. Exercise C: Hamstring stretch, supine  1. Lie on your back. 2. Hold both ends of a belt or towel as you loop it over the ball of your left / right foot. The ball of your foot is on the walking surface, right under your toes. 3. Straighten your left / right knee and slowly pull on  the belt to raise your leg. Stop when you feel a gentle stretch in the back of your left / right knee or thigh. ? Do not let your left / right knee bend. ? Keep your other leg flat on the floor. 4. Hold this position for __________ seconds. Repeat __________ times. Complete this exercise __________ times a day. Strengthening exercises These exercises build strength and endurance in your leg. Endurance is the ability to use your muscles for a long time, even after they get tired. Exercise D: Quadriceps wall slides  1. Lean your back against a smooth wall or door while you walk your feet out 18-24 inches (46-61 cm) from it. 2. Place your feet hip-width apart. 3. Slowly slide down the wall or door until your knees bend as far as told by your health care provider. Keep your knees over your heels, not your toes. Keep your knees in line with your hips. 4. Hold for __________ seconds. 5. Push through your heels to stand up to rest for __________ seconds after each repetition. Repeat __________ times. Complete this exercise __________ times a day. Exercise E: Straight leg raises ( hip abductors) 1. Lie on your side, with your left / right leg in the top position. Lie so your head, shoulder, knee, and hip line up with each other. You may bend your bottom knee to help you balance. 2. Lift your top leg 4-6 inches (10-15 cm) while keeping your   toes pointed straight ahead. 3. Hold this position for __________ seconds. 4. Slowly lower your leg to the starting position. Allow your muscles to relax completely after each repetition. Repeat __________ times. Complete this exercise __________ times a day. Exercise F: Straight leg raises ( hip extensors) 1. Lie on your abdomen on a firm surface. You can put a pillow under your hips if that is more comfortable. 2. Tense the muscles in your buttocks and lift your left / right leg about 4-6 inches (10-15 cm). Keep your knee straight as you lift your leg. 3. Hold  this position for __________ seconds. 4. Slowly lower your leg to the starting position. 5. Let your leg relax completely after each repetition. Repeat __________ times. Complete this exercise __________ times a day. Exercise G: Bridge ( hip extensors) 1. Lie on your back on a firm surface with your knees bent and your feet flat on the floor. 2. Tighten your buttocks muscles and lift your bottom off the floor until your trunk is level with your thighs. ? Do not arch your back. ? You should feel the muscles working in your buttocks and the back of your thighs. If you do not feel these muscles, slide your feet 1-2 inches (2.5-5 cm) farther away from your buttocks. 3. Hold this position for __________ seconds. 4. Slowly lower your hips to the starting position. 5. Let your buttocks muscles relax completely between repetitions. 6. If this exercise is too easy, try doing it with your arms crossed over your chest. Repeat __________ times. Complete this exercise __________ times a day. This information is not intended to replace advice given to you by your health care provider. Make sure you discuss any questions you have with your health care provider. Document Released: 12/27/2004 Document Revised: 09/03/2015 Document Reviewed: 12/09/2014 [employer] Interactive Patient Education  2018 [employer]  

## 2016-08-24 NOTE — Progress Notes (Signed)
Office Visit Note  Patient: Yolanda Peters             Date of Birth: [birth year]           MRN: [id number]             PCP: Shon Baton, MD Referring: Shon Baton, MD Visit Date: 08/24/2016 Occupation: @GUAROCC @    Subjective:  Bilateral hip pain.   History of Present Illness: Yolanda Peters is a 76 y.o. female with history of autoimmune disease and osteoarthritis. She states her bilateral trochanteric area has been very painful for the last 3 days. She's having difficulty walking and also nocturnal pain.   Activities of Daily Living:  Patient reports morning stiffness for0 minute.   Patient Reports nocturnal pain.  Difficulty dressing/grooming: Reports Difficulty climbing stairs: Reports Difficulty getting out of chair: Reports Difficulty using hands for taps, buttons, cutlery, and/or writing: Denies   Review of Systems  Constitutional: Negative for fatigue, night sweats, weight gain, weight loss and weakness.  HENT: Negative for mouth sores, trouble swallowing, trouble swallowing, mouth dryness and nose dryness.   Eyes: Negative for pain, redness, visual disturbance and dryness.  Respiratory: Negative for cough, shortness of breath and difficulty breathing.   Cardiovascular: Negative for chest pain, palpitations, hypertension, irregular heartbeat and swelling in legs/feet.  Gastrointestinal: Negative for blood in stool, constipation and diarrhea.  Endocrine: Negative for increased urination.  Genitourinary: Negative for vaginal dryness.  Musculoskeletal: Positive for arthralgias and joint pain. Negative for joint swelling, myalgias, muscle weakness, morning stiffness, muscle tenderness and myalgias.  Skin: Negative for color change, rash, hair loss, skin tightness, ulcers and sensitivity to sunlight.  Allergic/Immunologic: Negative for susceptible to infections.  Neurological: Negative for dizziness, memory loss and night sweats.  Hematological: Negative for swollen glands.   Psychiatric/Behavioral: Negative for depressed mood and sleep disturbance. The patient is not nervous/anxious.     PMFS History:  Patient Active Problem List   Diagnosis Date Noted  . Autoimmune disease ([employer]) 03/15/2016  . High risk medication use 03/15/2016  . Pain in joint of right shoulder 03/15/2016  . Bilateral hip pain 03/15/2016  . Primary osteoarthritis of both knees 03/15/2016  . 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 Arthritis 02/24/2014  . HTN (hypertension) 02/24/2014  . Hyperlipidemia 02/24/2014  . Breast cancer, right, upper outer quadrant  07/05/2010    Past Medical History:  Diagnosis Date  . Allergy   . Arthritis   . Cancer ([place])    right breast   . Hyperlipidemia    takes Niacin  . Hypertension     Family History  Problem Relation Age of Onset  . Cancer Mother        bladder  . Cancer Father        leukemia  . Cancer Maternal Aunt        breast  . Colon cancer Paternal Aunt   . Colon cancer Cousin   . Esophageal cancer Neg Hx   . Rectal cancer Neg Hx   . Stomach cancer Neg Hx    Past Surgical History:  Procedure Laterality Date  . BREAST DUCTAL SYSTEM EXCISION     right breast  . BREAST SURGERY  august 2011   right partial mastectomy  . CATARACT EXTRACTION     bilateral  . CESAREAN SECTION    . ELBOW SURGERY     left  . MYOMECTOMY     Social History   Social History Narrative  .  No narrative on file     Objective: Vital Signs: BP (!) 165/87 (BP Location: Left Arm, Patient Position: Sitting, Cuff Size: Normal)   Pulse 75   Ht 5\' 5"  (1.651 m)   Wt 168 lb (76.2 kg)   BMI 27.96 kg/m    Physical Exam  Constitutional: She is oriented to person, place, and time. She appears well-developed and well-nourished.  HENT:  Head: Normocephalic and atraumatic.  Eyes: Conjunctivae and EOM are normal.  Neck: Normal range of motion.  Cardiovascular: Normal rate, regular rhythm, normal heart sounds and intact distal pulses.     Pulmonary/Chest: Effort normal and breath sounds normal.  Abdominal: Soft. Bowel sounds are normal.  Lymphadenopathy:    She has no cervical adenopathy.  Neurological: She is alert and oriented to person, place, and time.  Skin: Skin is warm and dry. Capillary refill takes less than 2 seconds.  Psychiatric: She has a normal mood and affect. Her behavior is normal.  Nursing note and vitals reviewed.    Musculoskeletal Exam: C-spine and thoracic lumbar spine good range of motion. Shoulder joints elbow joints wrist joints are good range of motion. She had DIP PIP subluxation and arthritis. She is tenderness on palpation of bilateral trochanteric bursa consistent with trochanteric bursitis. CDAI Exam: No CDAI exam completed.    Investigation: No additional findings.   Imaging: No results found.  Speciality Comments: No specialty comments available.    Procedures:  Large Joint Inj Date/Time: 08/24/2016 10:12 AM Performed by: Bo Merino Authorized by: Bo Merino   Consent Given by:  Patient Site marked: the procedure site was marked   Timeout: prior to procedure the correct patient, procedure, and site was verified   Indications:  Pain Location:  Hip Site:  L greater trochanter Prep: patient was prepped and draped in usual sterile fashion   Needle Size:  27 G Needle Length:  1.5 inches Approach:  Lateral Ultrasound Guidance: No   Fluoroscopic Guidance: No   Arthrogram: No   Medications:  40 mg triamcinolone acetonide 40 MG/ML; 1.5 mL lidocaine 1 % Aspiration Attempted: No   Aspirate amount (mL):  0 Patient tolerance:  Patient tolerated the procedure well with no immediate complications Large Joint Inj Date/Time: 08/24/2016 10:15 AM Performed by: Bo Merino Authorized by: Bo Merino   Consent Given by:  Patient Site marked: the procedure site was marked   Timeout: prior to procedure the correct patient, procedure, and site was verified    Indications:  Pain Location:  Hip Site:  R greater trochanter Prep: patient was prepped and draped in usual sterile fashion   Needle Size:  27 G Needle Length:  1.5 inches Approach:  Lateral Ultrasound Guidance: No   Fluoroscopic Guidance: No   Arthrogram: No   Medications:  40 mg triamcinolone acetonide 40 MG/ML; 1.5 mL lidocaine 1 % Aspiration Attempted: No   Aspirate amount (mL):  0 Patient tolerance:  Patient tolerated the procedure well with no immediate complications   Allergies: Penicillins   Assessment / Plan:     Visit Diagnoses: Autoimmune disease ([place]): Doing fairly well  High risk medication use: She is on Plaquenil which she is tolerating well.  Bilateral hip pain: Her symptoms were consistent with bilateral trochanteric bursitis. After informed consent was obtained different treatment options were discussed bilateral trochanteric area were injected with cortisone as described above. She tolerated the procedure well. I've advised her to monitor blood pressure closely as her blood pressure was elevated. She states she is  not taking depression medication today.  Essential hypertension    Orders: No orders of the defined types were placed in this encounter.  No orders of the defined types were placed in this encounter.   Face-to-face time spent with patient was 20 minutes. Greater than 50% of time was spent in counseling and coordination of care.  Follow-Up Instructions: No Follow-up on file.   Bo Merino, MD  Note - This record has been created using Editor, commissioning.  Chart creation errors have been sought, but may not always  have been located. Such creation errors do not reflect on  the standard of medical care.

## 2016-08-31 ENCOUNTER — Other Ambulatory Visit: Payer: Self-pay | Admitting: Obstetrics & Gynecology

## 2016-08-31 DIAGNOSIS — Z853 Personal history of malignant neoplasm of breast: Secondary | ICD-10-CM

## 2016-09-11 NOTE — Progress Notes (Signed)
Office Visit Note  Patient: Yolanda Peters             Date of Birth: 1940/03/11           MRN: [id number]             PCP: Shon Baton, MD Referring: Shon Baton, MD Visit Date: 09/20/2016 Occupation: @GUAROCC @    Subjective:  stiffness.   History of Present Illness: Yolanda Peters is a 76 y.o. female with history of autoimmune disease and osteoarthritis. She has been doing very well on Plaquenil. She denies any joint pain or joint swelling. She has done really well with Euflexxa injections in June. She denies any joint pain.)  Activities of Daily Living:  Patient reports morning stiffness for 0 minute.   Patient Denies nocturnal pain.  Difficulty dressing/grooming: Denies Difficulty climbing stairs: Reports Difficulty getting out of chair: Reports Difficulty using hands for taps, buttons, cutlery, and/or writing: Denies   Review of Systems  Constitutional: Negative for fatigue, night sweats, weight gain, weight loss and weakness.  HENT: Negative for mouth sores, trouble swallowing, trouble swallowing, mouth dryness and nose dryness.   Eyes: Negative for pain, redness, visual disturbance and dryness.  Respiratory: Negative for cough, shortness of breath and difficulty breathing.   Cardiovascular: Negative for chest pain, palpitations, hypertension, irregular heartbeat and swelling in legs/feet.  Gastrointestinal: Negative for blood in stool, constipation and diarrhea.  Endocrine: Negative for increased urination.  Genitourinary: Negative for vaginal dryness.  Musculoskeletal: Positive for arthralgias and joint pain. Negative for joint swelling, myalgias, muscle weakness, morning stiffness, muscle tenderness and myalgias.  Skin: Negative for color change, rash, hair loss, skin tightness, ulcers and sensitivity to sunlight.  Allergic/Immunologic: Negative for susceptible to infections.  Neurological: Negative for dizziness, memory loss and night sweats.  Hematological:  Negative for swollen glands.  Psychiatric/Behavioral: Negative for depressed mood and sleep disturbance. The patient is not nervous/anxious.     PMFS History:  Patient Active Problem List   Diagnosis Date Noted  . Autoimmune disease ([place]) 03/15/2016  . High risk medication use 03/15/2016  . Pain in joint of right shoulder 03/15/2016  . Bilateral hip pain 03/15/2016  . Primary osteoarthritis of both knees 03/15/2016  . 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 Arthritis 02/24/2014  . HTN (hypertension) 02/24/2014  . Hyperlipidemia 02/24/2014  . Breast cancer, right, upper outer quadrant  07/05/2010    Past Medical History:  Diagnosis Date  . Allergy   . Arthritis   . Cancer ([place])    right breast   . Hyperlipidemia    takes Niacin  . Hypertension     Family History  Problem Relation Age of Onset  . Cancer Mother        bladder  . Cancer Father        leukemia  . Cancer Maternal Aunt        breast  . Colon cancer Paternal Aunt   . Colon cancer Cousin   . Esophageal cancer Neg Hx   . Rectal cancer Neg Hx   . Stomach cancer Neg Hx    Past Surgical History:  Procedure Laterality Date  . BREAST DUCTAL SYSTEM EXCISION     right breast  . BREAST SURGERY  august 2011   right partial mastectomy  . CATARACT EXTRACTION     bilateral  . CESAREAN SECTION    . ELBOW SURGERY     left  . MYOMECTOMY     Social History  Social History Narrative  . No narrative on file     Objective: Vital Signs: BP 120/74   Pulse 74   Resp 16   Ht 5\' 5"  (1.651 m)   Wt 164 lb (74.4 kg)   BMI 27.29 kg/m    Physical Exam  Constitutional: She is oriented to person, place, and time. She appears well-developed and well-nourished.  HENT:  Head: Normocephalic and atraumatic.  Eyes: Conjunctivae and EOM are normal.  Neck: Normal range of motion.  Cardiovascular: Normal rate, regular rhythm, normal heart sounds and intact distal pulses.   Pulmonary/Chest: Effort normal and breath  sounds normal.  Abdominal: Soft. Bowel sounds are normal.  Lymphadenopathy:    She has no cervical adenopathy.  Neurological: She is alert and oriented to person, place, and time.  Skin: Skin is warm and dry. Capillary refill takes less than 2 seconds.  Psychiatric: She has a normal mood and affect. Her behavior is normal.  Nursing note and vitals reviewed.    Musculoskeletal Exam: C-spine and thoracic lumbar spine good range of motion. Shoulder joints elbow joints wrist joints are good range of motion. She has PIP/DIP severe arthritis with subluxation . No synovitis was noted. Hip joints are good range of motion. Her knee joint extension was limited and had crepitus. She has some osteoarthritic changes in her feet.  CDAI Exam: No CDAI exam completed.    Investigation: No additional findings.PLQ eye exam: 04/2016 CBC Latest Ref Rng & Units 08/17/2016 01/13/2016 11/24/2014  WBC 3.8 - 10.8 K/uL 6.9 5.1 5.6  Hemoglobin 11.7 - 15.5 g/dL 14.4 14.6 14.7  Hematocrit 35.0 - 45.0 % 43.1 45.5(H) 44.7  Platelets 140 - 400 K/uL 274 244 239   CMP Latest Ref Rng & Units 08/17/2016 01/13/2016 11/24/2014  Glucose 65 - 99 mg/dL 91 72 81  BUN 7 - 25 mg/dL 28(H) 20 21.5  Creatinine 0.60 - 0.93 mg/dL 1.03(H) 0.90 0.7  Sodium 135 - 146 mmol/L 142 142 142  Potassium 3.5 - 5.3 mmol/L 4.7 4.3 4.0  Chloride 98 - 110 mmol/L 104 104 -  CO2 20 - 32 mmol/L 24 26 27   Calcium 8.6 - 10.4 mg/dL 9.4 9.0 9.2  Total Protein 6.1 - 8.1 g/dL 6.3 6.4 6.7  Total Bilirubin 0.2 - 1.2 mg/dL 0.7 1.2 0.90  Alkaline Phos 33 - 130 U/L 87 88 96  AST 10 - 35 U/L 20 21 21   ALT 6 - 29 U/L 23 22 23     Imaging: No results found.  Speciality Comments: No specialty comments available.    Procedures:  No procedures performed Allergies: Penicillins   Assessment / Plan:     Visit Diagnoses: Autoimmune disease ([place]) - +ANA , +RF, h/o inflammatory arthritis.. She is clinically well with no synovitis in a long time. We had detailed  discussion. I would like to taper her Plaquenil to one a day for 3 months and if she does well she may decrease it to every other day and discontinue after 3 months. I would like to see how she does off Plaquenil. I will repeat her labs when she returns for follow-up visit.  High risk medication use - PLQ: eye exam 04/2016. Her labs have been stable. She had recent labs with her PCP according to her her creatinine was normal there.  Primary osteoarthritis of both hands - Severe: Joint protection and muscle strengthening discussed.  Bilateral primary osteoarthritis of knee - S/P Euflexxa x3 in 06/2016. She is generally well with Visco  supplement she is currently not having much discomfort in her knees  Primary osteoarthritis of both feet: Robert fitting shoes were discussed.  Her other medical problems are listed as follows:  Hypertension, unspecified type  History of hyperlipidemia  History of breast cancer    Orders: No orders of the defined types were placed in this encounter.  No orders of the defined types were placed in this encounter.    Follow-Up Instructions: Return in about 6 months (around 03/20/2017) for Autoimmune disease, Osteoarthritis.   Bo Merino, MD  Note - This record has been created using Editor, commissioning.  Chart creation errors have been sought, but may not always  have been located. Such creation errors do not reflect on  the standard of medical care.

## 2016-09-20 ENCOUNTER — Encounter: Payer: Self-pay | Admitting: Rheumatology

## 2016-09-20 ENCOUNTER — Ambulatory Visit: Payer: Medicare Other | Admitting: Rheumatology

## 2016-09-20 ENCOUNTER — Ambulatory Visit (INDEPENDENT_AMBULATORY_CARE_PROVIDER_SITE_OTHER): Payer: Medicare Other | Admitting: Rheumatology

## 2016-09-20 VITALS — BP 120/74 | HR 74 | Resp 16 | Ht 65.0 in | Wt 164.0 lb

## 2016-09-20 DIAGNOSIS — Z8639 Personal history of other endocrine, nutritional and metabolic disease: Secondary | ICD-10-CM

## 2016-09-20 DIAGNOSIS — M170 Bilateral primary osteoarthritis of knee: Secondary | ICD-10-CM

## 2016-09-20 DIAGNOSIS — M359 Systemic involvement of connective tissue, unspecified: Secondary | ICD-10-CM

## 2016-09-20 DIAGNOSIS — Z853 Personal history of malignant neoplasm of breast: Secondary | ICD-10-CM | POA: Diagnosis not present

## 2016-09-20 DIAGNOSIS — I10 Essential (primary) hypertension: Secondary | ICD-10-CM | POA: Diagnosis not present

## 2016-09-20 DIAGNOSIS — M19041 Primary osteoarthritis, right hand: Secondary | ICD-10-CM | POA: Diagnosis not present

## 2016-09-20 DIAGNOSIS — M19042 Primary osteoarthritis, left hand: Secondary | ICD-10-CM

## 2016-09-20 DIAGNOSIS — M19072 Primary osteoarthritis, left ankle and foot: Secondary | ICD-10-CM

## 2016-09-20 DIAGNOSIS — M19071 Primary osteoarthritis, right ankle and foot: Secondary | ICD-10-CM

## 2016-09-20 DIAGNOSIS — Z79899 Other long term (current) drug therapy: Secondary | ICD-10-CM | POA: Diagnosis not present

## 2016-09-20 DIAGNOSIS — D8989 Other specified disorders involving the immune mechanism, not elsewhere classified: Secondary | ICD-10-CM | POA: Diagnosis not present

## 2016-10-03 ENCOUNTER — Ambulatory Visit
Admission: RE | Admit: 2016-10-03 | Discharge: 2016-10-03 | Disposition: A | Discharge status: Home / Self Care | Payer: Medicare Other | Source: Ambulatory Visit | Attending: Obstetrics & Gynecology | Admitting: Obstetrics & Gynecology

## 2016-10-03 ENCOUNTER — Other Ambulatory Visit: Payer: Self-pay | Admitting: Obstetrics & Gynecology

## 2016-10-03 DIAGNOSIS — Z853 Personal history of malignant neoplasm of breast: Secondary | ICD-10-CM

## 2016-10-03 HISTORY — DX: Malignant neoplasm of unspecified site of unspecified female breast: C50.919

## 2016-10-03 HISTORY — DX: Personal history of irradiation: Z92.3

## 2016-11-16 ENCOUNTER — Ambulatory Visit (INDEPENDENT_AMBULATORY_CARE_PROVIDER_SITE_OTHER): Payer: Medicare Other | Admitting: Rheumatology

## 2016-11-16 ENCOUNTER — Telehealth: Payer: Self-pay | Admitting: Rheumatology

## 2016-11-16 VITALS — BP 141/74 | HR 89

## 2016-11-16 DIAGNOSIS — M25562 Pain in left knee: Secondary | ICD-10-CM

## 2016-11-16 DIAGNOSIS — G8929 Other chronic pain: Secondary | ICD-10-CM | POA: Diagnosis not present

## 2016-11-16 DIAGNOSIS — M170 Bilateral primary osteoarthritis of knee: Secondary | ICD-10-CM

## 2016-11-16 DIAGNOSIS — M25561 Pain in right knee: Secondary | ICD-10-CM

## 2016-11-16 MED ORDER — LIDOCAINE HCL 1 % IJ SOLN
1.5000 mL | INTRAMUSCULAR | Status: AC | PRN
  Administered 2016-11-16: 1.5 mL

## 2016-11-16 MED ORDER — TRIAMCINOLONE ACETONIDE 40 MG/ML IJ SUSP
40.0000 mg | INTRAMUSCULAR | Status: AC | PRN
  Administered 2016-11-16: 40 mg via INTRA_ARTICULAR

## 2016-11-16 NOTE — Telephone Encounter (Signed)
Patient calling in reference to increased pain with knees. Patient wants to see if it is time for Euflexxa again. Please call to advise.

## 2016-11-16 NOTE — Progress Notes (Signed)
   Procedure Note  Patient: Yolanda Peters             Date of Birth: 1940-02-05           MRN: [id number]             Visit Date: 11/16/2016 History of present illness: Patient has been having pain and discomfort in her bilateral knee joints for the last 3 weeks. She had Visco supplement injections. Months ago. She came in today to getcortisone injections in her bilateral knees.she has known history of osteoarthritis. Procedures: Visit Diagnoses: Chronic pain of both knees  Primary osteoarthritis of both knees  Large Joint Inj: bilateral knee on 11/16/2016 4:33 PM Indications: pain Details: 27 G 1.5 in needle, medial approach  Arthrogram: No  Medications (Right): 1.5 mL lidocaine 1 %; 40 mg triamcinolone acetonide 40 MG/ML Aspirate (Right): 0 mL Medications (Left): 1.5 mL lidocaine 1 %; 40 mg triamcinolone acetonide 40 MG/ML Aspirate (Left): 0 mL Outcome: tolerated well, no immediate complications Procedure, treatment alternatives, risks and benefits explained, specific risks discussed. Consent was given by the patient. Immediately prior to procedure a time out was called to verify the correct patient, procedure, equipment, support staff and site/side marked as required. Patient was prepped and draped in the usual sterile fashion.     Bo Merino, MD

## 2016-11-16 NOTE — Telephone Encounter (Signed)
Patient advised she is unable to have Euflexxa until after the first of the year. Patient offered an appointment for today for bilateral cortisone injections.

## 2017-02-06 ENCOUNTER — Telehealth: Payer: Self-pay | Admitting: Rheumatology

## 2017-02-06 NOTE — Telephone Encounter (Signed)
Patient calling to find out about copay insurance s telling her she has for Euflexxa. Patient states she should not have an copay. Please call to advise.

## 2017-02-13 ENCOUNTER — Telehealth: Payer: Self-pay | Admitting: Rheumatology

## 2017-02-13 NOTE — Telephone Encounter (Signed)
Patient request a call from you in regards to Euflexxa injections. Patient left a message 4:05 pm

## 2017-02-13 NOTE — Telephone Encounter (Signed)
Patient left a voicemail regarding her Euflexa that is being ordered for her knee injection.

## 2017-02-14 ENCOUNTER — Other Ambulatory Visit: Payer: Self-pay | Admitting: *Deleted

## 2017-02-14 MED ORDER — SODIUM HYALURONATE (VISCOSUP) 20 MG/2ML IX SOLN: INTRA_ARTICULAR | 3 refills | Status: DC

## 2017-02-14 NOTE — Telephone Encounter (Signed)
I left message for patient to call  [facility] to have Hyalgan delivered to office.

## 2017-02-14 NOTE — Telephone Encounter (Signed)
Do you know the status of her injections?  Thank you!

## 2017-02-16 ENCOUNTER — Telehealth: Payer: Self-pay | Admitting: Rheumatology

## 2017-02-16 NOTE — Telephone Encounter (Signed)
Estill Bamberg from [facility] called to schedule delivery of Hyalgan 20mg , 2/ml.  Please call [phone]

## 2017-02-16 NOTE — Telephone Encounter (Signed)
Delivery expected 02/17/17

## 2017-02-17 ENCOUNTER — Telehealth: Payer: Self-pay | Admitting: Rheumatology

## 2017-02-17 NOTE — Telephone Encounter (Signed)
error 

## 2017-02-20 ENCOUNTER — Telehealth: Payer: Self-pay | Admitting: Rheumatology

## 2017-02-20 NOTE — Telephone Encounter (Signed)
#   [phone] Euflexxa. [facility] request a call back to get more info from [place]. [facility] sent a fax to [place] on Feb 1 requesting this info.

## 2017-02-20 NOTE — Telephone Encounter (Signed)
I called [facility], I called patient, shipment pending

## 2017-02-22 ENCOUNTER — Telehealth: Payer: Self-pay | Admitting: Rheumatology

## 2017-02-22 NOTE — Telephone Encounter (Signed)
Please call in reference to Hyalgan. RX is not covered with insurance. REF# [id number] Meds preferred Orthovisc, Monovisc, and Euflexxa.

## 2017-02-22 NOTE — Telephone Encounter (Signed)
Euflexxa bil approved from [facility], I called patient, patient will call to have Euflexxa delivered to office, patient purchased BIL.

## 2017-02-23 ENCOUNTER — Telehealth: Payer: Self-pay | Admitting: Rheumatology

## 2017-02-23 NOTE — Telephone Encounter (Signed)
Patient calling again today to request you call [facility] [phone] to give doctor clarification on Euflexxa, and then they can process order.

## 2017-02-23 NOTE — Telephone Encounter (Signed)
I called pharmacy to clarify Euflexxa, lmom for patient to call pharmacy to discuss copay and shipment.

## 2017-02-24 ENCOUNTER — Telehealth (INDEPENDENT_AMBULATORY_CARE_PROVIDER_SITE_OTHER): Payer: Self-pay | Admitting: *Deleted

## 2017-02-24 NOTE — Telephone Encounter (Signed)
Euflexxa bil patient purchased delivered to office 12/24/17, [facility] for patient to call office to schedule appts w/[facility].

## 2017-03-01 ENCOUNTER — Ambulatory Visit (INDEPENDENT_AMBULATORY_CARE_PROVIDER_SITE_OTHER): Payer: BLUE CROSS/BLUE SHIELD | Admitting: Physician Assistant

## 2017-03-01 DIAGNOSIS — M170 Bilateral primary osteoarthritis of knee: Secondary | ICD-10-CM

## 2017-03-01 DIAGNOSIS — M1711 Unilateral primary osteoarthritis, right knee: Secondary | ICD-10-CM | POA: Diagnosis not present

## 2017-03-01 DIAGNOSIS — M1712 Unilateral primary osteoarthritis, left knee: Secondary | ICD-10-CM

## 2017-03-01 MED ORDER — SODIUM HYALURONATE (VISCOSUP) 20 MG/2ML IX SOSY
20.0000 mg | PREFILLED_SYRINGE | INTRA_ARTICULAR | Status: AC | PRN
  Administered 2017-03-01: 20 mg via INTRA_ARTICULAR

## 2017-03-01 MED ORDER — LIDOCAINE HCL 1 % IJ SOLN
1.5000 mL | INTRAMUSCULAR | Status: AC | PRN
  Administered 2017-03-01: 1.5 mL

## 2017-03-01 NOTE — Progress Notes (Signed)
   Procedure Note  Patient: ANNALEIA PENCE             Date of Birth: 1940/05/27           MRN: [id number]             Visit Date: 03/01/2017  Procedures: Visit Diagnoses: Primary osteoarthritis of both knees Euflexxa #1 bilateral  Large Joint Inj: bilateral knee on 03/01/2017 3:05 PM Indications: pain Details: 27 G 1.5 in needle, medial approach  Arthrogram: No  Medications (Right): 20 mg Sodium Hyaluronate 20 MG/2ML; 1.5 mL lidocaine 1 % Aspirate (Right): 0 mL Medications (Left): 20 mg Sodium Hyaluronate 20 MG/2ML; 1.5 mL lidocaine 1 % Aspirate (Left): 0 mL Outcome: tolerated well, no immediate complications Procedure, treatment alternatives, risks and benefits explained, specific risks discussed. Consent was given by the patient. Immediately prior to procedure a time out was called to verify the correct patient, procedure, equipment, support staff and site/side marked as required. Patient was prepped and draped in the usual sterile fashion.      Hazel Sams, PA-C

## 2017-03-08 NOTE — Progress Notes (Signed)
Office Visit Note  Patient: Yolanda Peters             Date of Birth: 07-08-1940           MRN: [id number]             PCP: Shon Baton, MD Referring: Shon Baton, MD Visit Date: 03/21/2017 Occupation: _0 @    Subjective:  Pain in right knee.   History of Present Illness: Yolanda Peters is a 77 y.o. female with history of autoimmune disease and osteoarthritis.  She states she has been having some discomfort in her right knee joint which she describes in the popliteal region.  She does have osteoarthritis in her hands and feet which is not causing much discomfort currently.  Denies any joint swelling.  She ran out of Plaquenil in December and could not get a refill.  Activities of Daily Living:  Patient reports morning stiffness for 0 minutes.   Patient Denies nocturnal pain.  Difficulty dressing/grooming: Denies Difficulty climbing stairs: Reports Difficulty getting out of chair: Reports Difficulty using hands for taps, buttons, cutlery, and/or writing: Denies   Review of Systems  Constitutional: Negative for fatigue.  HENT: Negative for mouth dryness.   Eyes: Negative for dryness.  Respiratory: Negative for difficulty breathing.   Cardiovascular: Negative for swelling in legs/feet.  Gastrointestinal: Negative for abdominal pain.  Endocrine: Negative for increased urination.  Genitourinary: Negative for pelvic pain.  Musculoskeletal: Positive for arthralgias and joint pain. Negative for joint swelling and morning stiffness.  Skin: Negative for hair loss and redness.  Allergic/Immunologic: Negative for susceptible to infections.  Neurological: Negative for numbness and headaches.  Hematological: Negative for bruising/bleeding tendency.  Psychiatric/Behavioral: Negative for confusion.    PMFS History:  Patient Active Problem List   Diagnosis Date Noted  . Autoimmune disease ([place]) 03/15/2016  . High risk medication use 03/15/2016  . Pain in joint of right shoulder  03/15/2016  . Bilateral hip pain 03/15/2016  . Primary osteoarthritis of both knees 03/15/2016  . 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 Arthritis 02/24/2014  . HTN (hypertension) 02/24/2014  . Hyperlipidemia 02/24/2014  . Breast cancer, right, upper outer quadrant  07/05/2010    Past Medical History:  Diagnosis Date  . Allergy   . Arthritis   . Breast cancer [facility]) 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  . Cancer ([place])    right breast   . Hyperlipidemia    takes Niacin  . Hypertension   . Personal history of radiation therapy 2011   rt breast    Family History  Problem Relation Age of Onset  . Cancer Mother        bladder  . Cancer Father        leukemia  . Cancer Maternal Aunt        breast  . Colon cancer Paternal Aunt   . Colon cancer Cousin   . Esophageal cancer Neg Hx   . Rectal cancer Neg Hx   . Stomach cancer Neg Hx    Past Surgical History:  Procedure Laterality Date  . BREAST DUCTAL SYSTEM EXCISION     right breast  . BREAST SURGERY  august 2011   right partial mastectomy  . CATARACT EXTRACTION     bilateral  . CESAREAN SECTION    . ELBOW SURGERY     left  . MYOMECTOMY     Social History   Social History Narrative  . Not on file  Objective: Vital Signs: BP 124/72 (BP Location: Left Arm, Patient Position: Sitting, Cuff Size: Normal)   Pulse 81   Resp 17   Ht _0  (1.651 m)   Wt 167 lb (75.8 kg)   BMI 27.79 kg/m    Physical Exam  Constitutional: She is oriented to person, place, and time. She appears well-developed and well-nourished.  HENT:  Head: Normocephalic and atraumatic.  Eyes: Conjunctivae and EOM are normal.  Neck: Normal range of motion.  Cardiovascular: Normal rate, regular rhythm, normal heart sounds and intact distal pulses.  Pulmonary/Chest: Effort normal and breath sounds normal.  Abdominal: Soft. Bowel sounds are normal.  Lymphadenopathy:    She has no cervical adenopathy.  Neurological: She is alert and oriented to person,  place, and time.  Skin: Skin is warm and dry. Capillary refill takes less than 2 seconds.  Psychiatric: She has a normal mood and affect. Her behavior is normal.  Nursing note and vitals reviewed.    Musculoskeletal Exam: C-spine thoracic lumbar spine good range of motion.  Shoulder joints elbow joints wrist joints with good range of motion.  She has subluxation of all PIP and DIP joints with inflammatory component of some of her PIP joints.  No MCP changes were noted.  She has some warmth in her right knee joint with some discomfort on range of motion.  Ankle joints MTPs did not show any inflammation.  CDAI Exam: CDAI Homunculus Exam:   Tenderness:  RLE: tibiofemoral  Joint Counts:  CDAI Tender Joint count: 1 CDAI Swollen Joint count: 0  Global Assessments:  Patient Global Assessment: 3 Provider Global Assessment: 1  CDAI Calculated Score: 5    Investigation: No additional findings.PLQ eye exam: 04/2016 CBC Latest Ref Rng & Units 03/09/2017 08/17/2016 01/13/2016  WBC 3.8 - 10.8 Thousand/uL 6.9 6.9 5.1  Hemoglobin 11.7 - 15.5 g/dL 14.8 14.4 14.6  Hematocrit 35.0 - 45.0 % 43.9 43.1 45.5(H)  Platelets 140 - 400 Thousand/uL 277 274 244   CMP Latest Ref Rng & Units 03/09/2017 08/17/2016 01/13/2016  Glucose 65 - 99 mg/dL 92 91 72  BUN 7 - 25 mg/dL 18 28(H) 20  Creatinine 0.60 - 0.93 mg/dL 0.97(H) 1.03(H) 0.90  Sodium 135 - 146 mmol/L 140 142 142  Potassium 3.5 - 5.3 mmol/L 3.5 4.7 4.3  Chloride 98 - 110 mmol/L 107 104 104  CO2 20 - 32 mmol/L _1 Calcium 8.6 - 10.4 mg/dL 9.1 9.4 9.0  Total Protein 6.1 - 8.1 g/dL 6.3 6.3 6.4  Total Bilirubin 0.2 - 1.2 mg/dL 0.8 0.7 1.2  Alkaline Phos 33 - 130 U/L - 87 88  AST 10 - 35 U/L _2 ALT 6 - 29 U/L _3 March 01, 2017 ANA 1: 180 homogeneous, dsDNA negative, ESR 2, C3-C4 normal, vitamin D 40, UA showed calcium oxalate crystals. January 13, 2016 RF negative  Imaging: Xr Knee 3 View Right  Result Date: 03/21/2017 Severe  lateral compartment narrowing with lateral osteophytes and intercondylar osteophytes.  No chondrocalcinosis was noted.  Severe patellofemoral narrowing and spurring was noted. 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   Speciality Comments: No specialty comments available.    Procedures:  Large Joint Inj: R knee on 03/21/2017 10:53 AM Indications: pain Details: 27 G 1.5 in needle, medial approach  Arthrogram: No  Medications: 1.5 mL lidocaine (PF) 1 %; 40 mg triamcinolone acetonide 40 MG/ML Aspirate: 0 mL Outcome: tolerated well, no  immediate complications Procedure, treatment alternatives, risks and benefits explained, specific risks discussed. Consent was given by the patient. Immediately prior to procedure a time out was called to verify the correct patient, procedure, equipment, support staff and site/side marked as required. Patient was prepped and draped in the usual sterile fashion.     Allergies: Penicillins   Assessment / Plan:     Visit Diagnoses: Autoimmune disease ([place]) - +ANA low titer, dsDNA negative, history of inflammatory arthritis.  She had positive rheumatoid factor at one time which became negative later.  She continues to have some inflammatory component in her joints.  She has been off Plaquenil for 3 months now and has been having increased joint discomfort and pain.  She would like to resume Plaquenil.  Prescription refill was given today.  High risk medication use - PLQ: eye exam 04/2016.  She has eye exam coming up in April 2019.  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  Primary osteoarthritis of both knees - S/P Euflexxa.  She has been having increased pain and discomfort in her right knee joint.  I will obtain x-ray of the right knee joint today.  The x-ray reveals severe osteoarthritis of the right knee joint with severe chondromalacia patella.  We  discussed different treatment options.  She has done Euflexxa in the past.  She would like to proceed with cortisone injection.  The procedure as described above.  Primary osteoarthritis of both feet: Proper fitting shoes were discussed.  Other medical problems are listed as follows:  History of breast cancer  History of hypertension: Her blood pressure is controlled.  History of hyperlipidemia    Orders: Orders Placed This Encounter  Procedures  . Large Joint Inj  . XR KNEE 3 VIEW RIGHT   Meds ordered this encounter  Medications  . hydroxychloroquine (PLAQUENIL) 200 MG tablet    Sig: TAKE 1 TABLET TWICE A DAY ON MONDAY THROUGH FRIDAY    Dispense:  120 tablet    Refill:  0    Face-to-face time spent with patient was 30 minutes.  Greater than 50% of time was spent in counseling and coordination of care.  Follow-Up Instructions: Return in about 5 months (around 08/21/2017) for Autoimmune disease, Osteoarthritis.   Bo Merino, MD  Note - This record has been created using Editor, commissioning.  Chart creation errors have been sought, but may not always  have been located. Such creation errors do not reflect on  the standard of medical care.

## 2017-03-09 ENCOUNTER — Ambulatory Visit (INDEPENDENT_AMBULATORY_CARE_PROVIDER_SITE_OTHER): Payer: Medicare Other | Admitting: Physician Assistant

## 2017-03-09 DIAGNOSIS — M359 Systemic involvement of connective tissue, unspecified: Secondary | ICD-10-CM

## 2017-03-09 DIAGNOSIS — M1712 Unilateral primary osteoarthritis, left knee: Secondary | ICD-10-CM

## 2017-03-09 DIAGNOSIS — M170 Bilateral primary osteoarthritis of knee: Secondary | ICD-10-CM

## 2017-03-09 DIAGNOSIS — M1711 Unilateral primary osteoarthritis, right knee: Secondary | ICD-10-CM

## 2017-03-09 MED ORDER — LIDOCAINE HCL 1 % IJ SOLN
1.5000 mL | INTRAMUSCULAR | Status: AC | PRN
  Administered 2017-03-09: 1.5 mL

## 2017-03-09 MED ORDER — SODIUM HYALURONATE (VISCOSUP) 20 MG/2ML IX SOSY
20.0000 mg | PREFILLED_SYRINGE | INTRA_ARTICULAR | Status: AC | PRN
  Administered 2017-03-09: 20 mg via INTRA_ARTICULAR

## 2017-03-09 NOTE — Progress Notes (Signed)
   Procedure Note  Patient: Yolanda Peters             Date of Birth: 1940/06/12           MRN: [id number]             Visit Date: 03/09/2017  Procedures: Visit Diagnoses: Primary osteoarthritis of both knees  Autoimmune disease ([place]) - Plan: CBC with Differential/Platelet, COMPLETE METABOLIC PANEL WITH GFR, Urinalysis, Routine w reflex microscopic, ANA, Anti-DNA antibody, double-stranded, Sedimentation rate, C3 and C4, VITAMIN D 25 Hydroxy (Vit-D Deficiency, Fractures)  Large Joint Inj: bilateral knee on 03/09/2017 2:09 PM Indications: pain Details: 27 G 1.5 in needle, medial approach  Arthrogram: No  Medications (Right): 1.5 mL lidocaine 1 %; 20 mg Sodium Hyaluronate 20 MG/2ML Aspirate (Right): 0 mL Medications (Left): 1.5 mL lidocaine 1 %; 20 mg Sodium Hyaluronate 20 MG/2ML Aspirate (Left): 0 mL Outcome: tolerated well, no immediate complications Procedure, treatment alternatives, risks and benefits explained, specific risks discussed. Consent was given by the patient. Immediately prior to procedure a time out was called to verify the correct patient, procedure, equipment, support staff and site/side marked as required. Patient was prepped and draped in the usual sterile fashion.

## 2017-03-10 LAB — COMPLETE METABOLIC PANEL WITH GFR
AG Ratio: 2 (calc) (ref 1.0–2.5)
ALT: 21 U/L (ref 6–29)
AST: 17 U/L (ref 10–35)
Albumin: 4.2 g/dL (ref 3.6–5.1)
Alkaline phosphatase (APISO): 78 U/L (ref 33–130)
BUN/Creatinine Ratio: 19 (calc) (ref 6–22)
BUN: 18 mg/dL (ref 7–25)
CO2: 26 mmol/L (ref 20–32)
Calcium: 9.1 mg/dL (ref 8.6–10.4)
Chloride: 107 mmol/L (ref 98–110)
Creat: 0.97 mg/dL — ABNORMAL HIGH (ref 0.60–0.93)
GFR, Est African American: 66 mL/min/{1.73_m2} (ref >=60)
GFR, Est Non African American: 57 mL/min/{1.73_m2} — ABNORMAL LOW (ref >=60)
Globulin: 2.1 g/dL (calc) (ref 1.9–3.7)
Glucose, Bld: 92 mg/dL (ref 65–99)
Potassium: 3.5 mmol/L (ref 3.5–5.3)
Sodium: 140 mmol/L (ref 135–146)
Total Bilirubin: 0.8 mg/dL (ref 0.2–1.2)
Total Protein: 6.3 g/dL (ref 6.1–8.1)

## 2017-03-10 LAB — URINALYSIS, ROUTINE W REFLEX MICROSCOPIC
Bacteria, UA: NONE SEEN /HPF
Bilirubin Urine: NEGATIVE
Glucose, UA: NEGATIVE
Hgb urine dipstick: NEGATIVE
Leukocytes, UA: NEGATIVE
Nitrite: NEGATIVE
RBC / HPF: NONE SEEN /HPF (ref 0–2)
Specific Gravity, Urine: 1.031 (ref 1.001–1.03)
pH: 5.5 (ref 5.0–8.0)

## 2017-03-10 LAB — CBC WITH DIFFERENTIAL/PLATELET
Basophils Absolute: 48 cells/uL (ref 0–200)
Basophils Relative: 0.7 %
Eosinophils Absolute: 117 cells/uL (ref 15–500)
Eosinophils Relative: 1.7 %
HCT: 43.9 % (ref 35.0–45.0)
Hemoglobin: 14.8 g/dL (ref 11.7–15.5)
Lymphs Abs: 1256 cells/uL (ref 850–3900)
MCH: 28.4 pg (ref 27.0–33.0)
MCHC: 33.7 g/dL (ref 32.0–36.0)
MCV: 84.3 fL (ref 80.0–100.0)
MPV: 9.9 fL (ref 7.5–12.5)
Monocytes Relative: 8.9 %
Neutro Abs: 4865 cells/uL (ref 1500–7800)
Neutrophils Relative %: 70.5 %
Platelets: 277 10*3/uL (ref 140–400)
RBC: 5.21 10*6/uL — ABNORMAL HIGH (ref 3.80–5.10)
RDW: 13.6 % (ref 11.0–15.0)
Total Lymphocyte: 18.2 %
WBC mixed population: 614 cells/uL (ref 200–950)
WBC: 6.9 10*3/uL (ref 3.8–10.8)

## 2017-03-10 LAB — ANTI-NUCLEAR AB-TITER (ANA TITER): ANA Titer 1: 1:160 {titer} — ABNORMAL HIGH

## 2017-03-10 LAB — C3 AND C4
C3 Complement: 137 mg/dL (ref 83–193)
C4 Complement: 31 mg/dL (ref 15–57)

## 2017-03-10 LAB — ANA: Anti Nuclear Antibody(ANA): POSITIVE — AB

## 2017-03-10 LAB — SEDIMENTATION RATE: Sed Rate: 2 mm/h (ref 0–30)

## 2017-03-10 LAB — ANTI-DNA ANTIBODY, DOUBLE-STRANDED: ds DNA Ab: <1 IU/mL

## 2017-03-10 LAB — VITAMIN D 25 HYDROXY (VIT D DEFICIENCY, FRACTURES): Vit D, 25-Hydroxy: 40 ng/mL (ref 30–100)

## 2017-03-10 NOTE — Progress Notes (Signed)
Dr. Estanislado Pandy will discuss lab results with pt at follow up visit on 03/15/17

## 2017-03-15 ENCOUNTER — Ambulatory Visit (INDEPENDENT_AMBULATORY_CARE_PROVIDER_SITE_OTHER): Payer: Medicare Other | Admitting: Physician Assistant

## 2017-03-15 DIAGNOSIS — M1711 Unilateral primary osteoarthritis, right knee: Secondary | ICD-10-CM

## 2017-03-15 DIAGNOSIS — M170 Bilateral primary osteoarthritis of knee: Secondary | ICD-10-CM

## 2017-03-15 DIAGNOSIS — M1712 Unilateral primary osteoarthritis, left knee: Secondary | ICD-10-CM

## 2017-03-15 MED ORDER — SODIUM HYALURONATE (VISCOSUP) 20 MG/2ML IX SOSY
20.0000 mg | PREFILLED_SYRINGE | INTRA_ARTICULAR | Status: AC | PRN
  Administered 2017-03-15: 20 mg via INTRA_ARTICULAR

## 2017-03-15 MED ORDER — LIDOCAINE HCL 1 % IJ SOLN
1.5000 mL | INTRAMUSCULAR | Status: AC | PRN
  Administered 2017-03-15: 1.5 mL

## 2017-03-15 NOTE — Progress Notes (Signed)
   Procedure Note  Patient: Yolanda Peters             Date of Birth: 1940/04/02           MRN: [id number]             Visit Date: 03/15/2017  Procedures: Visit Diagnoses: Primary osteoarthritis of both knees Euflexxa #3 bilateral  Large Joint Inj: bilateral knee on 03/15/2017 1:43 PM Indications: pain Details: 27 G 1.5 in needle, medial approach  Arthrogram: No  Medications (Right): 20 mg Sodium Hyaluronate 20 MG/2ML; 1.5 mL lidocaine 1 % Aspirate (Right): 0 mL Medications (Left): 20 mg Sodium Hyaluronate 20 MG/2ML; 1.5 mL lidocaine 1 % Aspirate (Left): 0 mL Outcome: tolerated well, no immediate complications Procedure, treatment alternatives, risks and benefits explained, specific risks discussed. Consent was given by the patient. Immediately prior to procedure a time out was called to verify the correct patient, procedure, equipment, support staff and site/side marked as required. Patient was prepped and draped in the usual sterile fashion.     Hazel Sams PA-C

## 2017-03-21 ENCOUNTER — Ambulatory Visit (INDEPENDENT_AMBULATORY_CARE_PROVIDER_SITE_OTHER): Payer: Medicare Other | Admitting: Rheumatology

## 2017-03-21 ENCOUNTER — Encounter: Payer: Self-pay | Admitting: Rheumatology

## 2017-03-21 ENCOUNTER — Ambulatory Visit (INDEPENDENT_AMBULATORY_CARE_PROVIDER_SITE_OTHER): Payer: Self-pay

## 2017-03-21 VITALS — BP 124/72 | HR 81 | Resp 17 | Ht 65.0 in | Wt 167.0 lb

## 2017-03-21 DIAGNOSIS — M19042 Primary osteoarthritis, left hand: Secondary | ICD-10-CM | POA: Diagnosis not present

## 2017-03-21 DIAGNOSIS — Z853 Personal history of malignant neoplasm of breast: Secondary | ICD-10-CM | POA: Diagnosis not present

## 2017-03-21 DIAGNOSIS — Z8679 Personal history of other diseases of the circulatory system: Secondary | ICD-10-CM | POA: Diagnosis not present

## 2017-03-21 DIAGNOSIS — Z8639 Personal history of other endocrine, nutritional and metabolic disease: Secondary | ICD-10-CM

## 2017-03-21 DIAGNOSIS — M19071 Primary osteoarthritis, right ankle and foot: Secondary | ICD-10-CM

## 2017-03-21 DIAGNOSIS — D8989 Other specified disorders involving the immune mechanism, not elsewhere classified: Secondary | ICD-10-CM | POA: Diagnosis not present

## 2017-03-21 DIAGNOSIS — Z79899 Other long term (current) drug therapy: Secondary | ICD-10-CM

## 2017-03-21 DIAGNOSIS — M19072 Primary osteoarthritis, left ankle and foot: Secondary | ICD-10-CM | POA: Diagnosis not present

## 2017-03-21 DIAGNOSIS — M19041 Primary osteoarthritis, right hand: Secondary | ICD-10-CM

## 2017-03-21 DIAGNOSIS — M170 Bilateral primary osteoarthritis of knee: Secondary | ICD-10-CM | POA: Diagnosis not present

## 2017-03-21 DIAGNOSIS — M359 Systemic involvement of connective tissue, unspecified: Secondary | ICD-10-CM

## 2017-03-21 MED ORDER — LIDOCAINE HCL (PF) 1 % IJ SOLN
1.5000 mL | INTRAMUSCULAR | Status: AC | PRN
  Administered 2017-03-21: 1.5 mL

## 2017-03-21 MED ORDER — TRIAMCINOLONE ACETONIDE 40 MG/ML IJ SUSP
40.0000 mg | INTRAMUSCULAR | Status: AC | PRN
  Administered 2017-03-21: 40 mg via INTRA_ARTICULAR

## 2017-03-21 MED ORDER — HYDROXYCHLOROQUINE SULFATE 200 MG PO TABS: ORAL_TABLET | ORAL | 0 refills | Status: DC

## 2017-03-21 NOTE — Patient Instructions (Signed)
Natural anti-inflammatories  You can purchase these at [employer], [employer] or online.  . Turmeric (capsules)  . Ginger (ginger root or capsules)  . Omega 3 (Fish, flax seeds, chia seeds, walnuts, almonds)  . Tart cherry (dried or extract)   Patient should be under the care of a physician while taking these supplements. This may not be reproduced without the permission of Dr. Kashton Mcartor.  

## 2017-03-27 ENCOUNTER — Telehealth: Payer: Self-pay | Admitting: Rheumatology

## 2017-03-27 NOTE — Telephone Encounter (Signed)
Patient called stating that she is scheduled to see Dr. Juanita Laster at [facility] on 05/02/17.  They asked patient to bring in her xray that she had taken at her last office visit with Dr. Estanislado Pandy.  Patient is requesting to pick it up when it is ready.

## 2017-03-28 NOTE — Telephone Encounter (Signed)
I called patient, CD of right knee ready for pickup

## 2017-06-12 NOTE — Progress Notes (Signed)
 Office Visit Note  Patient: Yolanda Peters             Date of Birth: 10/12/1940           MRN: [id number]             PCP: Russo, John, MD Referring: Russo, John, MD Visit Date: 06/13/2017 Occupation: @GUAROCC@    Subjective:  Pain in both knees.   History of Present Illness: Yolanda Peters is a 77 y.o. female with history of autoimmune disease and osteoarthritis.  She states she has been taking 1 Plaquenil a day as she forgets the second pill.  She has been having increased pain and discomfort in her bilateral knee joints.  And she would like to have cortisone injection to both knees.  She states she went to Dr. Bolin for possible total knee replacement but he would prefer to wait at this time.  None of the other joints are painful.  He denies any joint swelling.  Activities of Daily Living:  Patient reports morning stiffness for 0 minute.   Patient Denies nocturnal pain.  Difficulty dressing/grooming: Denies Difficulty climbing stairs: Reports Difficulty getting out of chair: Reports Difficulty using hands for taps, buttons, cutlery, and/or writing: Denies   Review of Systems  Constitutional: Negative for fatigue, night sweats, weight gain and weight loss.  HENT: Negative for mouth sores, trouble swallowing, trouble swallowing, mouth dryness and nose dryness.   Eyes: Negative for pain, redness, visual disturbance and dryness.  Respiratory: Negative for cough, shortness of breath and difficulty breathing.   Cardiovascular: Negative for chest pain, palpitations, hypertension, irregular heartbeat and swelling in legs/feet.  Gastrointestinal: Negative for blood in stool, constipation and diarrhea.  Endocrine: Negative for increased urination.  Genitourinary: Negative for vaginal dryness.  Musculoskeletal: Positive for arthralgias and joint pain. Negative for joint swelling, myalgias, muscle weakness, morning stiffness, muscle tenderness and myalgias.  Skin: Negative for color  change, rash, hair loss, skin tightness, ulcers and sensitivity to sunlight.  Allergic/Immunologic: Negative for susceptible to infections.  Neurological: Negative for dizziness, memory loss, night sweats and weakness.  Hematological: Negative for swollen glands.  Psychiatric/Behavioral: Negative for depressed mood and sleep disturbance. The patient is not nervous/anxious.     PMFS History:  Patient Active Problem List   Diagnosis Date Noted  . Autoimmune disease ([facility]) 03/15/2016  . High risk medication use 03/15/2016  . Pain in joint of right shoulder 03/15/2016  . Bilateral hip pain 03/15/2016  . Primary osteoarthritis of both knees 03/15/2016  . 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 Arthritis 02/24/2014  . HTN (hypertension) 02/24/2014  . Hyperlipidemia 02/24/2014  . Breast cancer, right, upper outer quadrant  07/05/2010    Past Medical History:  Diagnosis Date  . Allergy   . Arthritis   . 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  . Cancer ([facility])    right breast   . Hyperlipidemia    takes Niacin  . Hypertension   . Personal history of radiation therapy 2011   rt breast    Family History  Problem Relation Age of Onset  . Cancer Mother        bladder  . Cancer Father        leukemia  . Cancer Maternal Aunt        breast  . Colon cancer Paternal Aunt   . Colon cancer Cousin   . Esophageal cancer Neg Hx   . Rectal cancer Neg Hx   .   Stomach cancer Neg Hx    Past Surgical History:  Procedure Laterality Date  . BREAST DUCTAL SYSTEM EXCISION     right breast  . BREAST SURGERY  august 2011   right partial mastectomy  . CATARACT EXTRACTION     bilateral  . CESAREAN SECTION    . ELBOW SURGERY     left  . MYOMECTOMY     Social History   Social History Narrative  . Not on file     Objective: Vital Signs: BP 124/72 (BP Location: Left Arm, Patient Position: Sitting, Cuff Size: Normal)   Pulse 75   Resp 13   Ht 5' 5.5" (1.664 m)   Wt 162 lb (73.5 kg)    BMI 26.55 kg/m    Physical Exam  Constitutional: She is oriented to person, place, and time. She appears well-developed and well-nourished.  HENT:  Head: Normocephalic and atraumatic.  Eyes: Conjunctivae and EOM are normal.  Neck: Normal range of motion.  Cardiovascular: Normal rate, regular rhythm, normal heart sounds and intact distal pulses.  Pulmonary/Chest: Effort normal and breath sounds normal.  Abdominal: Soft. Bowel sounds are normal.  Lymphadenopathy:    She has no cervical adenopathy.  Neurological: She is alert and oriented to person, place, and time.  Skin: Skin is warm and dry. Capillary refill takes less than 2 seconds.  Psychiatric: She has a normal mood and affect. Her behavior is normal.  Nursing note and vitals reviewed.    Musculoskeletal Exam: C-spine thoracic lumbar spine good range of motion.  Shoulder joints elbow joints wrist joints good range of motion.  She has DIP PIP thickening in her hands and feet consistent with osteoarthritis.  CDAI Exam: CDAI Homunculus Exam:   Tenderness:  RLE: tibiofemoral LLE: tibiofemoral  Joint Counts:  CDAI Tender Joint count: 2 CDAI Swollen Joint count: 0  Global Assessments:  Patient Global Assessment: 1 Provider Global Assessment: 1  CDAI Calculated Score: 4    Investigation: No additional findings.PLQ eye exam: 04/05/2017 CBC Latest Ref Rng & Units 03/09/2017 08/17/2016 01/13/2016  WBC 3.8 - 10.8 Thousand/uL 6.9 6.9 5.1  Hemoglobin 11.7 - 15.5 g/dL 14.8 14.4 14.6  Hematocrit 35.0 - 45.0 % 43.9 43.1 45.5(H)  Platelets 140 - 400 Thousand/uL 277 274 244   CMP Latest Ref Rng & Units 03/09/2017 08/17/2016 01/13/2016  Glucose 65 - 99 mg/dL 92 91 72  BUN 7 - 25 mg/dL 18 28(H) 20  Creatinine 0.60 - 0.93 mg/dL 0.97(H) 1.03(H) 0.90  Sodium 135 - 146 mmol/L 140 142 142  Potassium 3.5 - 5.3 mmol/L 3.5 4.7 4.3  Chloride 98 - 110 mmol/L 107 104 104  CO2 20 - 32 mmol/L 26 24 26  Calcium 8.6 - 10.4 mg/dL 9.1 9.4 9.0  Total  Protein 6.1 - 8.1 g/dL 6.3 6.3 6.4  Total Bilirubin 0.2 - 1.2 mg/dL 0.8 0.7 1.2  Alkaline Phos 33 - 130 U/L - 87 88  AST 10 - 35 U/L 17 20 21  ALT 6 - 29 U/L 21 23 22   Every 28 2019 C3-C4 normal, ESR 2, dsDNA negative, vitamin D 40, ANA 1: 160 homogeneous Imaging: No results found.  Speciality Comments: [facility]    Procedures:  Large Joint Inj: bilateral knee on 06/13/2017 2:16 PM Indications: pain Details: 27 G 1.5 in needle, medial approach  Arthrogram: No  Medications (Right): 1.5 mL lidocaine 1 %; 40 mg triamcinolone acetonide 40 MG/ML   Aspirate (Right): 0 mL Medications (Left): 1.5 mL lidocaine 1 %; 40 mg triamcinolone acetonide 40 MG/ML Aspirate (Left): 0 mL Outcome: tolerated well, no immediate complications Procedure, treatment alternatives, risks and benefits explained, specific risks discussed. Consent was given by the patient. Immediately prior to procedure a time out was called to verify the correct patient, procedure, equipment, support staff and site/side marked as required. Patient was prepped and draped in the usual sterile fashion.     Allergies: Penicillins   Assessment / Plan:     Visit Diagnoses: Autoimmune disease ([facility]) - +ANA low titer, dsDNA negative, history of inflammatory arthritis.  Patient had no synovitis on examination today.  She has been doing quite well on Plaquenil 1 tablet a day.  She does not want to increase the dose as it is controlling her symptoms.  I was in agreement with the treatment plan.  High risk medication use - PLQ 200 mg po qdeye exam: 04/05/2017  Primary osteoarthritis of both hands-she continues to have some stiffness.  Primary osteoarthritis of both knees-was having increased pain and stiffness and discomfort in her knee joints.  She has had [employer] supplement injections in the past.  She also had an opinion from orthopedic surgeon and is not ready for total knee  replacement yet.  Per her request cortisone injection was given to her bilateral knee joints.  She tolerated the procedure well.  Primary osteoarthritis of both feet-she is wearing proper fitting shoes.  History of hypertension  History of breast cancer  History of hyperlipidemia    Orders: Orders Placed This Encounter  Procedures  . Large Joint Inj   No orders of the defined types were placed in this encounter.  .  Follow-Up Instructions: Return for Autoimmune disease, Osteoarthritis.   Shaili Deveshwar, MD  Note - This record has been created using Dragon software.  Chart creation errors have been sought, but may not always  have been located. Such creation errors do not reflect on  the standard of medical care. 

## 2017-06-13 ENCOUNTER — Other Ambulatory Visit: Payer: Self-pay | Admitting: Rheumatology

## 2017-06-13 ENCOUNTER — Ambulatory Visit (INDEPENDENT_AMBULATORY_CARE_PROVIDER_SITE_OTHER): Payer: Medicare Other | Admitting: Rheumatology

## 2017-06-13 ENCOUNTER — Encounter: Payer: Self-pay | Admitting: Rheumatology

## 2017-06-13 VITALS — BP 124/72 | HR 75 | Resp 13 | Ht 65.5 in | Wt 162.0 lb

## 2017-06-13 DIAGNOSIS — M19071 Primary osteoarthritis, right ankle and foot: Secondary | ICD-10-CM | POA: Diagnosis not present

## 2017-06-13 DIAGNOSIS — M1712 Unilateral primary osteoarthritis, left knee: Secondary | ICD-10-CM | POA: Diagnosis not present

## 2017-06-13 DIAGNOSIS — M1711 Unilateral primary osteoarthritis, right knee: Secondary | ICD-10-CM

## 2017-06-13 DIAGNOSIS — D8989 Other specified disorders involving the immune mechanism, not elsewhere classified: Secondary | ICD-10-CM | POA: Diagnosis not present

## 2017-06-13 DIAGNOSIS — M19072 Primary osteoarthritis, left ankle and foot: Secondary | ICD-10-CM | POA: Diagnosis not present

## 2017-06-13 DIAGNOSIS — Z8639 Personal history of other endocrine, nutritional and metabolic disease: Secondary | ICD-10-CM | POA: Diagnosis not present

## 2017-06-13 DIAGNOSIS — M19041 Primary osteoarthritis, right hand: Secondary | ICD-10-CM | POA: Diagnosis not present

## 2017-06-13 DIAGNOSIS — M19042 Primary osteoarthritis, left hand: Secondary | ICD-10-CM

## 2017-06-13 DIAGNOSIS — Z8679 Personal history of other diseases of the circulatory system: Secondary | ICD-10-CM | POA: Diagnosis not present

## 2017-06-13 DIAGNOSIS — Z853 Personal history of malignant neoplasm of breast: Secondary | ICD-10-CM | POA: Diagnosis not present

## 2017-06-13 DIAGNOSIS — Z79899 Other long term (current) drug therapy: Secondary | ICD-10-CM | POA: Diagnosis not present

## 2017-06-13 DIAGNOSIS — M170 Bilateral primary osteoarthritis of knee: Secondary | ICD-10-CM

## 2017-06-13 DIAGNOSIS — M359 Systemic involvement of connective tissue, unspecified: Secondary | ICD-10-CM

## 2017-06-13 MED ORDER — LIDOCAINE HCL 1 % IJ SOLN
1.5000 mL | INTRAMUSCULAR | Status: AC | PRN
  Administered 2017-06-13: 1.5 mL

## 2017-06-13 MED ORDER — TRIAMCINOLONE ACETONIDE 40 MG/ML IJ SUSP
40.0000 mg | INTRAMUSCULAR | Status: AC | PRN
  Administered 2017-06-13: 40 mg via INTRA_ARTICULAR

## 2017-06-13 NOTE — Telephone Encounter (Signed)
Last Visit: 03/21/17 Next visit: 08/22/17 Labs: 03/09/17 cbc/cmp stable PLQ Eye Exam: 04/05/17 WNL   Okay to refill per Dr. Estanislado Pandy

## 2017-07-20 ENCOUNTER — Other Ambulatory Visit: Payer: Self-pay | Admitting: *Deleted

## 2017-07-20 DIAGNOSIS — Z79899 Other long term (current) drug therapy: Secondary | ICD-10-CM

## 2017-07-21 LAB — COMPLETE METABOLIC PANEL WITH GFR
AG Ratio: 1.8 (calc) (ref 1.0–2.5)
ALT: 21 U/L (ref 6–29)
AST: 18 U/L (ref 10–35)
Albumin: 4.2 g/dL (ref 3.6–5.1)
Alkaline phosphatase (APISO): 74 U/L (ref 33–130)
BUN: 21 mg/dL (ref 7–25)
CO2: 30 mmol/L (ref 20–32)
Calcium: 9.4 mg/dL (ref 8.6–10.4)
Chloride: 102 mmol/L (ref 98–110)
Creat: 0.85 mg/dL (ref 0.60–0.93)
GFR, Est African American: 77 mL/min/{1.73_m2} (ref >=60)
GFR, Est Non African American: 67 mL/min/{1.73_m2} (ref >=60)
Globulin: 2.4 g/dL (calc) (ref 1.9–3.7)
Glucose, Bld: 77 mg/dL (ref 65–99)
Potassium: 4.9 mmol/L (ref 3.5–5.3)
Sodium: 140 mmol/L (ref 135–146)
Total Bilirubin: 1.3 mg/dL — ABNORMAL HIGH (ref 0.2–1.2)
Total Protein: 6.6 g/dL (ref 6.1–8.1)

## 2017-07-21 LAB — CBC WITH DIFFERENTIAL/PLATELET
Basophils Absolute: 50 cells/uL (ref 0–200)
Basophils Relative: 0.7 %
Eosinophils Absolute: 130 cells/uL (ref 15–500)
Eosinophils Relative: 1.8 %
HCT: 44.1 % (ref 35.0–45.0)
Hemoglobin: 14.8 g/dL (ref 11.7–15.5)
Lymphs Abs: 1606 cells/uL (ref 850–3900)
MCH: 28.3 pg (ref 27.0–33.0)
MCHC: 33.6 g/dL (ref 32.0–36.0)
MCV: 84.3 fL (ref 80.0–100.0)
MPV: 9.7 fL (ref 7.5–12.5)
Monocytes Relative: 13 %
Neutro Abs: 4478 cells/uL (ref 1500–7800)
Neutrophils Relative %: 62.2 %
Platelets: 287 10*3/uL (ref 140–400)
RBC: 5.23 10*6/uL — ABNORMAL HIGH (ref 3.80–5.10)
RDW: 14.1 % (ref 11.0–15.0)
Total Lymphocyte: 22.3 %
WBC mixed population: 936 cells/uL (ref 200–950)
WBC: 7.2 10*3/uL (ref 3.8–10.8)

## 2017-07-25 ENCOUNTER — Telehealth: Payer: Self-pay | Admitting: Rheumatology

## 2017-07-25 NOTE — Telephone Encounter (Signed)
Patient called stating she was returning your call.   °

## 2017-07-26 NOTE — Telephone Encounter (Signed)
Patient advised lab results are stable.  

## 2017-08-08 NOTE — Progress Notes (Signed)
Office Visit Note  Patient: Yolanda Peters             Date of Birth: Jul 17, 1940           MRN: [id number]             PCP: Shon Baton, MD Referring: Shon Baton, MD Visit Date: 08/22/2017 Occupation: _0 @  Subjective:  No chief complaint on file.   History of Present Illness: Yolanda Peters is a 77 y.o. female with history of autoimmune disease and osteoarthritis.  She states he went to see Dr. Alvan Dame for osteoarthritis in her bilateral knee joints.  After evaluation he suggested not to go for total knee replacement yet.  She states she is not having much discomfort but she has difficulty walking due to osteoarthritis in her knee joints.  She had last cortisone injection to bilateral knee joints in June.  She is interested in getting the Visco supplement injections again.  She gets off-and-on discomfort in her bilateral trochanteric bursa.  She is not having much discomfort in her hands and feet.  Activities of Daily Living:  Patient reports morning stiffness for 0 minutes.   Patient Denies nocturnal pain.  Difficulty dressing/grooming: Denies Difficulty climbing stairs: Reports Difficulty getting out of chair: Reports Difficulty using hands for taps, buttons, cutlery, and/or writing: Denies  Review of Systems  Constitutional: Negative for fatigue, night sweats, weight gain and weight loss.  HENT: Negative for mouth sores, trouble swallowing, trouble swallowing, mouth dryness and nose dryness.   Eyes: Negative for pain, redness, visual disturbance and dryness.  Respiratory: Negative for cough, shortness of breath and difficulty breathing.   Cardiovascular: Negative for chest pain, palpitations, hypertension, irregular heartbeat and swelling in legs/feet.  Gastrointestinal: Negative for blood in stool, constipation and diarrhea.  Endocrine: Negative for increased urination.  Genitourinary: Negative for difficulty urinating and vaginal dryness.  Musculoskeletal: Negative for  arthralgias, joint pain, joint swelling, myalgias, muscle weakness, morning stiffness, muscle tenderness and myalgias.  Skin: Negative for color change, rash, hair loss, skin tightness, ulcers and sensitivity to sunlight.  Allergic/Immunologic: Negative for susceptible to infections.  Neurological: Negative for dizziness, numbness, headaches, memory loss, night sweats and weakness.  Hematological: Negative for swollen glands.  Psychiatric/Behavioral: Negative for depressed mood and sleep disturbance. The patient is not nervous/anxious.     PMFS History:  Patient Active Problem List   Diagnosis Date Noted  . Autoimmune disease ([place]) 03/15/2016  . High risk medication use 03/15/2016  . Pain in joint of right shoulder 03/15/2016  . Bilateral hip pain 03/15/2016  . Primary osteoarthritis of both knees 03/15/2016  . 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 Arthritis 02/24/2014  . HTN (hypertension) 02/24/2014  . Hyperlipidemia 02/24/2014  . Breast cancer, right, upper outer quadrant  07/05/2010    Past Medical History:  Diagnosis Date  . Allergy   . Arthritis   . Breast cancer [facility]) 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  . Cancer ([place])    right breast   . Hyperlipidemia    takes Niacin  . Hypertension   . Personal history of radiation therapy 2011   rt breast    Family History  Problem Relation Age of Onset  . Cancer Mother        bladder  . Cancer Father        leukemia  . Cancer Maternal Aunt        breast  . Colon cancer Paternal Aunt   . Colon cancer  Cousin   . Esophageal cancer Neg Hx   . Rectal cancer Neg Hx   . Stomach cancer Neg Hx    Past Surgical History:  Procedure Laterality Date  . BREAST DUCTAL SYSTEM EXCISION     right breast  . BREAST SURGERY  august 2011   right partial mastectomy  . CATARACT EXTRACTION     bilateral  . CESAREAN SECTION    . ELBOW SURGERY     left  . MYOMECTOMY     Social History   Social History Narrative  . Not on file     Objective: Vital Signs: BP 129/84 (BP Location: Left Arm, Patient Position: Sitting, Cuff Size: Normal)   Pulse 87   Ht 5' 5" (1.651 m)   Wt 156 lb 12.8 oz (71.1 kg)   BMI 26.09 kg/m    Physical Exam  Constitutional: She is oriented to person, place, and time. She appears well-developed and well-nourished.  HENT:  Head: Normocephalic and atraumatic.  Eyes: Conjunctivae and EOM are normal.  Neck: Normal range of motion.  Cardiovascular: Normal rate, regular rhythm, normal heart sounds and intact distal pulses.  Pulmonary/Chest: Effort normal and breath sounds normal.  Abdominal: Soft. Bowel sounds are normal.  Lymphadenopathy:    She has no cervical adenopathy.  Neurological: She is alert and oriented to person, place, and time.  Skin: Skin is warm and dry. Capillary refill takes less than 2 seconds.  Psychiatric: She has a normal mood and affect. Her behavior is normal.  Nursing note and vitals reviewed.    Musculoskeletal Exam: C-spine thoracic spine lumbar spine good range of motion.  She has thoracic kyphosis.  Shoulder joints elbow joints wrist joints were in good range of motion.  She has subluxation of her bilateral CMC and several of her PIP joints.  She has some inflammatory component in her PIP joints.  Some DIP changes were noted.  Hip joints knee joints were in good range of motion.  She has a right knee joint valgus deformity.  CDAI Exam: No CDAI exam completed.   Investigation: No additional findings.  Imaging: No results found.  Recent Labs: Lab Results  Component Value Date   WBC 7.2 07/20/2017   HGB 14.8 07/20/2017   PLT 287 07/20/2017   NA 140 07/20/2017   K 4.9 07/20/2017   CL 102 07/20/2017   CO2 30 07/20/2017   GLUCOSE 77 07/20/2017   BUN 21 07/20/2017   CREATININE 0.85 07/20/2017   BILITOT 1.3 (H) 07/20/2017   ALKPHOS 87 08/17/2016   AST 18 07/20/2017   ALT 21 07/20/2017   PROT 6.6 07/20/2017   ALBUMIN 4.2 08/17/2016   CALCIUM 9.4  07/20/2017   GFRAA 77 07/20/2017  February 2019 C3-C4 normal ANA 1: 160 dsDNA negative, ESR 2, vitamin D 40  Speciality Comments: [facility]  Procedures:  No procedures performed Allergies: Penicillins   Assessment / Plan:     Visit Diagnoses: Autoimmune disease ([place]) - +ANA low titer, dsDNA negative, history of inflammatory arthritis.  She still have some inflammatory component in the PIPs but no synovitis in MCPs or wrist joints.  Her autoimmune labs were stable in February.  We discussed coming off Plaquenil completely and see if there is any worsening of her symptoms.  I would repeat her autoimmune labs at follow-up visit.  In case she has increased inflammation she can resume Plaquenil.  High risk medication use -  PLQ 200 mg po 5 d/wkeye exam: 04/05/2017  Primary osteoarthritis of both hands-she has severe osteoarthritis with subluxation of PIPs and CMC's.  Joint protection muscle strengthening was discussed.  Primary osteoarthritis of both knees-she has severe osteoarthritis in her knee joints.  With right knee joint valgus deformity.  She went to see Dr. Alvan Dame and she decided not to go with surgery.  She wants to continue with Visco supplement injections.  Primary osteoarthritis of both feet-she has chronic discomfort in her feet.  History of hyperlipidemia  History of hypertension-her blood pressure is a stable.  History of breast cancer   Orders: No orders of the defined types were placed in this encounter.  No orders of the defined types were placed in this encounter.   Face-to-face time spent with patient was 30 minutes. Greater than 50% of time was spent in counseling and coordination of care.  Follow-Up Instructions: Return in about 5 months (around 01/22/2018) for Osteoarthritis, Autoimmune disease.   Bo Merino, MD  Note - This record has been created using Editor, commissioning.  Chart creation errors have  been sought, but may not always  have been located. Such creation errors do not reflect on  the standard of medical care.

## 2017-08-22 ENCOUNTER — Telehealth: Payer: Self-pay | Admitting: *Deleted

## 2017-08-22 ENCOUNTER — Ambulatory Visit (INDEPENDENT_AMBULATORY_CARE_PROVIDER_SITE_OTHER): Payer: Medicare Other | Admitting: Rheumatology

## 2017-08-22 ENCOUNTER — Encounter: Payer: Self-pay | Admitting: Rheumatology

## 2017-08-22 VITALS — BP 129/84 | HR 87 | Ht 65.0 in | Wt 156.8 lb

## 2017-08-22 DIAGNOSIS — Z8679 Personal history of other diseases of the circulatory system: Secondary | ICD-10-CM

## 2017-08-22 DIAGNOSIS — D8989 Other specified disorders involving the immune mechanism, not elsewhere classified: Secondary | ICD-10-CM | POA: Diagnosis not present

## 2017-08-22 DIAGNOSIS — M19041 Primary osteoarthritis, right hand: Secondary | ICD-10-CM | POA: Diagnosis not present

## 2017-08-22 DIAGNOSIS — M19042 Primary osteoarthritis, left hand: Secondary | ICD-10-CM

## 2017-08-22 DIAGNOSIS — Z853 Personal history of malignant neoplasm of breast: Secondary | ICD-10-CM

## 2017-08-22 DIAGNOSIS — M359 Systemic involvement of connective tissue, unspecified: Secondary | ICD-10-CM

## 2017-08-22 DIAGNOSIS — M170 Bilateral primary osteoarthritis of knee: Secondary | ICD-10-CM

## 2017-08-22 DIAGNOSIS — Z8639 Personal history of other endocrine, nutritional and metabolic disease: Secondary | ICD-10-CM

## 2017-08-22 DIAGNOSIS — M19071 Primary osteoarthritis, right ankle and foot: Secondary | ICD-10-CM

## 2017-08-22 DIAGNOSIS — Z79899 Other long term (current) drug therapy: Secondary | ICD-10-CM | POA: Diagnosis not present

## 2017-08-22 DIAGNOSIS — M19072 Primary osteoarthritis, left ankle and foot: Secondary | ICD-10-CM

## 2017-08-22 NOTE — Patient Instructions (Signed)
Knee Exercises Ask your health care provider which exercises are safe for you. Do exercises exactly as told by your health care provider and adjust them as directed. It is normal to feel mild stretching, pulling, tightness, or discomfort as you do these exercises, but you should stop right away if you feel sudden pain or your pain gets worse.Do not begin these exercises until told by your health care provider. STRETCHING AND RANGE OF MOTION EXERCISES These exercises warm up your muscles and joints and improve the movement and flexibility of your knee. These exercises also help to relieve pain, numbness, and tingling. Exercise A: Knee Extension, Prone 1. Lie on your abdomen on a bed. 2. Place your left / right knee just beyond the edge of the surface so your knee is not on the bed. You can put a towel under your left / right thigh just above your knee for comfort. 3. Relax your leg muscles and allow gravity to straighten your knee. You should feel a stretch behind your left / right knee. 4. Hold this position for __________ seconds. 5. Scoot up so your knee is supported between repetitions. Repeat __________ times. Complete this stretch __________ times a day. Exercise B: Knee Flexion, Active  1. Lie on your back with both knees straight. If this causes back discomfort, bend your left / right knee so your foot is flat on the floor. 2. Slowly slide your left / right heel back toward your buttocks until you feel a gentle stretch in the front of your knee or thigh. 3. Hold this position for __________ seconds. 4. Slowly slide your left / right heel back to the starting position. Repeat __________ times. Complete this exercise __________ times a day. Exercise C: Quadriceps, Prone  1. Lie on your abdomen on a firm surface, such as a bed or padded floor. 2. Bend your left / right knee and hold your ankle. If you cannot reach your ankle or pant leg, loop a belt around your foot and grab the belt  instead. 3. Gently pull your heel toward your buttocks. Your knee should not slide out to the side. You should feel a stretch in the front of your thigh and knee. 4. Hold this position for __________ seconds. Repeat __________ times. Complete this stretch __________ times a day. Exercise D: Hamstring, Supine 1. Lie on your back. 2. Loop a belt or towel over the ball of your left / right foot. The ball of your foot is on the walking surface, right under your toes. 3. Straighten your left / right knee and slowly pull on the belt to raise your leg until you feel a gentle stretch behind your knee. ? Do not let your left / right knee bend while you do this. ? Keep your other leg flat on the floor. 4. Hold this position for __________ seconds. Repeat __________ times. Complete this stretch __________ times a day. STRENGTHENING EXERCISES These exercises build strength and endurance in your knee. Endurance is the ability to use your muscles for a long time, even after they get tired. Exercise E: Quadriceps, Isometric  1. Lie on your back with your left / right leg extended and your other knee bent. Put a rolled towel or small pillow under your knee if told by your health care provider. 2. Slowly tense the muscles in the front of your left / right thigh. You should see your kneecap slide up toward your hip or see increased dimpling just above the knee. This   motion will push the back of the knee toward the floor. 3. For __________ seconds, keep the muscle as tight as you can without increasing your pain. 4. Relax the muscles slowly and completely. Repeat __________ times. Complete this exercise __________ times a day. Exercise F: Straight Leg Raises - Quadriceps 1. Lie on your back with your left / right leg extended and your other knee bent. 2. Tense the muscles in the front of your left / right thigh. You should see your kneecap slide up or see increased dimpling just above the knee. Your thigh may  even shake a bit. 3. Keep these muscles tight as you raise your leg 4-6 inches (10-15 cm) off the floor. Do not let your knee bend. 4. Hold this position for __________ seconds. 5. Keep these muscles tense as you lower your leg. 6. Relax your muscles slowly and completely after each repetition. Repeat __________ times. Complete this exercise __________ times a day. Exercise G: Hamstring, Isometric 1. Lie on your back on a firm surface. 2. Bend your left / right knee approximately __________ degrees. 3. Dig your left / right heel into the surface as if you are trying to pull it toward your buttocks. Tighten the muscles in the back of your thighs to dig as hard as you can without increasing any pain. 4. Hold this position for __________ seconds. 5. Release the tension gradually and allow your muscles to relax completely for __________ seconds after each repetition. Repeat __________ times. Complete this exercise __________ times a day. Exercise H: Hamstring Curls  If told by your health care provider, do this exercise while wearing ankle weights. Begin with __________ weights. Then increase the weight by 1 lb (0.5 kg) increments. Do not wear ankle weights that are more than __________. 1. Lie on your abdomen with your legs straight. 2. Bend your left / right knee as far as you can without feeling pain. Keep your hips flat against the floor. 3. Hold this position for __________ seconds. 4. Slowly lower your leg to the starting position.  Repeat __________ times. Complete this exercise __________ times a day. Exercise I: Squats (Quadriceps) 1. Stand in front of a table, with your feet and knees pointing straight ahead. You may rest your hands on the table for balance but not for support. 2. Slowly bend your knees and lower your hips like you are going to sit in a chair. ? Keep your weight over your heels, not over your toes. ? Keep your lower legs upright so they are parallel with the table  legs. ? Do not let your hips go lower than your knees. ? Do not bend lower than told by your health care provider. ? If your knee pain increases, do not bend as low. 3. Hold the squat position for __________ seconds. 4. Slowly push with your legs to return to standing. Do not use your hands to pull yourself to standing. Repeat __________ times. Complete this exercise __________ times a day. Exercise J: Wall Slides (Quadriceps)  1. Lean your back against a smooth wall or door while you walk your feet out 18-24 inches (46-61 cm) from it. 2. Place your feet hip-width apart. 3. Slowly slide down the wall or door until your knees bend __________ degrees. Keep your knees over your heels, not over your toes. Keep your knees in line with your hips. 4. Hold for __________ seconds. Repeat __________ times. Complete this exercise __________ times a day. Exercise K: Straight Leg Raises -   Hip Abductors 1. Lie on your side with your left / right leg in the top position. Lie so your head, shoulder, knee, and hip line up. You may bend your bottom knee to help you keep your balance. 2. Roll your hips slightly forward so your hips are stacked directly over each other and your left / right knee is facing forward. 3. Leading with your heel, lift your top leg 4-6 inches (10-15 cm). You should feel the muscles in your outer hip lifting. ? Do not let your foot drift forward. ? Do not let your knee roll toward the ceiling. 4. Hold this position for __________ seconds. 5. Slowly return your leg to the starting position. 6. Let your muscles relax completely after each repetition. Repeat __________ times. Complete this exercise __________ times a day. Exercise L: Straight Leg Raises - Hip Extensors 1. Lie on your abdomen on a firm surface. You can put a pillow under your hips if that is more comfortable. 2. Tense the muscles in your buttocks and lift your left / right leg about 4-6 inches (10-15 cm). Keep your knee  straight as you lift your leg. 3. Hold this position for __________ seconds. 4. Slowly lower your leg to the starting position. 5. Let your leg relax completely after each repetition. Repeat __________ times. Complete this exercise __________ times a day. This information is not intended to replace advice given to you by your health care provider. Make sure you discuss any questions you have with your health care provider. Document Released: 11/10/2004 Document Revised: 09/21/2015 Document Reviewed: 11/02/2014 Elsevier Interactive Patient Education  2018 [place] [facility] Ask your health care provider which exercises are safe for you. Do exercises exactly as told by your health care provider and adjust them as directed. It is normal to feel mild stretching, pulling, tightness, or discomfort as you do these exercises, but you should stop right away if you feel sudden pain or your pain gets worse.Do not begin these exercises until told by your health care provider. Stretching and range of motion exercises These exercises warm up your muscles and joints and improve the movement and flexibility of your hip and pelvis. Exercise A: Quadriceps, prone  1. Lie on your abdomen on a firm surface, such as a bed or padded floor. 2. Bend your left / right knee and hold your ankle. If you cannot reach your ankle or pant leg, loop a belt around your foot and grab the belt instead. 3. Gently pull your heel toward your buttocks. Your knee should not slide out to the side. You should feel a stretch in the front of your thigh and knee. 4. Hold this position for __________ seconds. Repeat __________ times. Complete this stretch __________ times a day. Exercise B: Iliotibial band  1. Lie on your side with your left / right leg in the top position. 2. Bend both of your knees and grab your left / right ankle. Stretch out your bottom arm to help you balance. 3. Slowly bring your top knee  back so your thigh goes behind your trunk. 4. Slowly lower your top leg toward the floor until you feel a gentle stretch on the outside of your left / right hip and thigh. If you do not feel a stretch and your knee will not fall farther, place the heel of your other foot on top of your knee and pull your knee down toward the floor with your foot. 5. Hold this position  for __________ seconds. Repeat __________ times. Complete this stretch __________ times a day. Strengthening exercises These exercises build strength and endurance in your hip and pelvis. Endurance is the ability to use your muscles for a long time, even after they get tired. Exercise C: Straight leg raises ( hip abductors) 1. Lie on your side with your left / right leg in the top position. Lie so your head, shoulder, knee, and hip line up. You may bend your bottom knee to help you balance. 2. Roll your hips slightly forward so your hips are stacked directly over each other and your left / right knee is facing forward. 3. Tense the muscles in your outer thigh and lift your top leg 4-6 inches (10-15 cm). 4. Hold this position for __________ seconds. 5. Slowly return to the starting position. Let your muscles relax completely before doing another repetition. Repeat __________ times. Complete this exercise __________ times a day. Exercise D: Straight leg raises ( hip extensors) 1. Lie on your abdomen on your bed or a firm surface. You can put a pillow under your hips if that is more comfortable. 2. Bend your left / right knee so your foot is straight up in the air. 3. Squeeze your buttock muscles and lift your left / right thigh off the bed. Do not let your back arch. 4. Tense this muscle as hard as you can without increasing any knee pain. 5. Hold this position for __________ seconds. 6. Slowly lower your leg to the starting position and allow it to relax completely. Repeat __________ times. Complete this exercise __________ times a  day. Exercise E: Hip hike 1. Stand sideways on a bottom step. Stand on your left / right leg with your other foot unsupported next to the step. You can hold onto the railing or wall if needed for balance. 2. Keep your knees straight and your torso square. Then, lift your left / right hip up toward the ceiling. 3. Slowly let your left / right hip lower toward the floor, past the starting position. Your foot should get closer to the floor. Do not lean or bend your knees. Repeat __________ times. Complete this exercise __________ times a day. This information is not intended to replace advice given to you by your health care provider. Make sure you discuss any questions you have with your health care provider. Document Released: 12/27/2004 Document Revised: 09/01/2015 Document Reviewed: 11/28/2014 Elsevier Interactive Patient Education  [employer].

## 2017-08-22 NOTE — Telephone Encounter (Signed)
Euflexxa bil, please see note in 03/06/17 in Media for patient specific directions on how to order Euflexxa per patient. I advised patient that she may have a refill and will need to call pharmacy to have refill shipped. Can you please double check and if no refill, can you please order based on patient instructions? Thank you so much!

## 2017-08-23 ENCOUNTER — Telehealth (INDEPENDENT_AMBULATORY_CARE_PROVIDER_SITE_OTHER): Payer: Self-pay

## 2017-08-23 NOTE — Telephone Encounter (Signed)
Called and talked with [place] concerning patient refill for Euflexxa.  Was advised that patient's PA had expired on 03/24/2017 and a new one needed to be submitted and also that patient's consent had expired as well, but [facility] will contact patient.  Will call insurance for PA of Euflexxa, bilateral knee.

## 2017-08-27 NOTE — Telephone Encounter (Signed)
Holding for you- see below

## 2017-08-28 ENCOUNTER — Telehealth (INDEPENDENT_AMBULATORY_CARE_PROVIDER_SITE_OTHER): Payer: Self-pay

## 2017-08-28 NOTE — Telephone Encounter (Signed)
Received PA form from [facility], completed PA form and faxed to [phone].

## 2017-08-28 NOTE — Telephone Encounter (Signed)
Received PA form for Euflexxa, bilateral knee. Completed and faxed PA form to [facility] at [phone].

## 2017-08-28 NOTE — Telephone Encounter (Signed)
Called and left VM for patient to return my call concerning Euflexxa Rx.

## 2017-08-28 NOTE — Telephone Encounter (Signed)
Talked with patient and advised her to call [facility] to give consent for Euflexxa injections to be delivered to our office.  Provided patient with phone number [phone].

## 2017-08-30 ENCOUNTER — Telehealth (INDEPENDENT_AMBULATORY_CARE_PROVIDER_SITE_OTHER): Payer: Self-pay

## 2017-08-30 NOTE — Telephone Encounter (Signed)
Patient is approved to have Euflexxa injection, bilateral knee. Patient purchased through [employer] ([employer] [facility]) Valid 07/25/2017- 09/23/2017  Can you please schedule patient appointment with Dr. Estanislado Pandy?  Thank You.

## 2017-09-01 ENCOUNTER — Ambulatory Visit (INDEPENDENT_AMBULATORY_CARE_PROVIDER_SITE_OTHER): Payer: Medicare Other | Admitting: Rheumatology

## 2017-09-01 ENCOUNTER — Other Ambulatory Visit: Payer: Self-pay | Admitting: Obstetrics & Gynecology

## 2017-09-01 DIAGNOSIS — M170 Bilateral primary osteoarthritis of knee: Secondary | ICD-10-CM | POA: Diagnosis not present

## 2017-09-01 DIAGNOSIS — Z853 Personal history of malignant neoplasm of breast: Secondary | ICD-10-CM

## 2017-09-01 MED ORDER — SODIUM HYALURONATE (VISCOSUP) 20 MG/2ML IX SOSY
20.0000 mg | PREFILLED_SYRINGE | INTRA_ARTICULAR | Status: AC | PRN
  Administered 2017-09-01: 20 mg via INTRA_ARTICULAR

## 2017-09-01 MED ORDER — SODIUM HYALURONATE (VISCOSUP) 20 MG/2ML IX SOSY
20.0000 mg | PREFILLED_SYRINGE | INTRA_ARTICULAR | Status: AC | PRN
Start: 2017-09-01 — End: 2017-09-01
  Administered 2017-09-01: 20 mg via INTRA_ARTICULAR

## 2017-09-01 MED ORDER — LIDOCAINE HCL 1 % IJ SOLN
1.5000 mL | INTRAMUSCULAR | Status: AC | PRN
  Administered 2017-09-01: 1.5 mL

## 2017-09-01 NOTE — Progress Notes (Signed)
   Procedure Note  Patient: CLAYTON JARMON             Date of Birth: [birth year]           MRN: [id number]             Visit Date: 09/01/2017  Procedures: Visit Diagnoses: Primary osteoarthritis of both knees - Plan: Large Joint Inj: bilateral knee Euflexxa #1 bilateral P/P Large Joint Inj: bilateral knee on 09/01/2017 10:49 AM Indications: pain Details: 27 G 1.5 in needle, medial approach  Arthrogram: No  Medications (Right): 20 mg Sodium Hyaluronate 20 MG/2ML; 1.5 mL lidocaine 1 % Aspirate (Right): 0 mL Medications (Left): 20 mg Sodium Hyaluronate 20 MG/2ML; 1.5 mL lidocaine 1 % Aspirate (Left): 0 mL Outcome: tolerated well, no immediate complications Procedure, treatment alternatives, risks and benefits explained, specific risks discussed. Consent was given by the patient. Immediately prior to procedure a time out was called to verify the correct patient, procedure, equipment, support staff and site/side marked as required. Patient was prepped and draped in the usual sterile fashion.      Patient tolerated the procedure well.  Bo Merino, MD

## 2017-09-05 ENCOUNTER — Other Ambulatory Visit: Payer: Self-pay | Admitting: Rheumatology

## 2017-09-05 NOTE — Telephone Encounter (Signed)
Last Visit: 08/22/17 Next visit: 02/20/18  Labs: 07/20/17 stable  PLQ Eye Exam: 04/05/17 WNL  Okay to refill per Dr. Estanislado Pandy

## 2017-09-08 ENCOUNTER — Ambulatory Visit (INDEPENDENT_AMBULATORY_CARE_PROVIDER_SITE_OTHER): Payer: Medicare Other | Admitting: Rheumatology

## 2017-09-08 DIAGNOSIS — M170 Bilateral primary osteoarthritis of knee: Secondary | ICD-10-CM | POA: Diagnosis not present

## 2017-09-08 DIAGNOSIS — M1711 Unilateral primary osteoarthritis, right knee: Secondary | ICD-10-CM | POA: Diagnosis not present

## 2017-09-08 DIAGNOSIS — M1712 Unilateral primary osteoarthritis, left knee: Secondary | ICD-10-CM

## 2017-09-08 MED ORDER — LIDOCAINE HCL 1 % IJ SOLN
1.5000 mL | INTRAMUSCULAR | Status: AC | PRN
  Administered 2017-09-08: 1.5 mL

## 2017-09-08 MED ORDER — SODIUM HYALURONATE (VISCOSUP) 20 MG/2ML IX SOSY
20.0000 mg | PREFILLED_SYRINGE | INTRA_ARTICULAR | Status: AC | PRN
  Administered 2017-09-08: 20 mg via INTRA_ARTICULAR

## 2017-09-08 NOTE — Progress Notes (Signed)
   Procedure Note  Patient: Yolanda Peters             Date of Birth: [birth year]           MRN: [id number]             Visit Date: 09/08/2017  Procedures: Visit Diagnoses: Primary osteoarthritis of both knees Euflexxa #2 bilateral P/P  Large Joint Inj: bilateral knee on 09/08/2017 10:47 AM Indications: pain Details: 27 G 1.5 in needle, medial approach  Arthrogram: No  Medications (Right): 20 mg Sodium Hyaluronate 20 MG/2ML; 1.5 mL lidocaine 1 % Aspirate (Right): 0 mL Medications (Left): 20 mg Sodium Hyaluronate 20 MG/2ML; 1.5 mL lidocaine 1 % Aspirate (Left): 0 mL Outcome: tolerated well, no immediate complications Procedure, treatment alternatives, risks and benefits explained, specific risks discussed. Consent was given by the patient. Immediately prior to procedure a time out was called to verify the correct patient, procedure, equipment, support staff and site/side marked as required. Patient was prepped and draped in the usual sterile fashion.    Bo Merino, MD

## 2017-09-15 ENCOUNTER — Ambulatory Visit (INDEPENDENT_AMBULATORY_CARE_PROVIDER_SITE_OTHER): Payer: Medicare Other | Admitting: Rheumatology

## 2017-09-15 DIAGNOSIS — M170 Bilateral primary osteoarthritis of knee: Secondary | ICD-10-CM | POA: Diagnosis not present

## 2017-09-15 MED ORDER — SODIUM HYALURONATE (VISCOSUP) 20 MG/2ML IX SOSY
20.0000 mg | PREFILLED_SYRINGE | INTRA_ARTICULAR | Status: AC | PRN
  Administered 2017-09-15: 20 mg via INTRA_ARTICULAR

## 2017-09-15 MED ORDER — LIDOCAINE HCL 1 % IJ SOLN
1.5000 mL | INTRAMUSCULAR | Status: AC | PRN
  Administered 2017-09-15: 1.5 mL

## 2017-09-15 NOTE — Progress Notes (Signed)
   Procedure Note  Patient: Yolanda Peters             Date of Birth: [birth year]           MRN: [id number]             Visit Date: 09/15/2017  Procedures: Visit Diagnoses: Primary osteoarthritis of both knees - Plan: Large Joint Inj: bilateral knee  Large Joint Inj: bilateral knee on 09/15/2017 9:34 AM Indications: pain Details: 27 G 1.5 in needle, medial approach  Arthrogram: No  Medications (Right): 20 mg Sodium Hyaluronate 20 MG/2ML; 1.5 mL lidocaine 1 % Aspirate (Right): 0 mL Medications (Left): 20 mg Sodium Hyaluronate 20 MG/2ML; 1.5 mL lidocaine 1 % Aspirate (Left): 0 mL Outcome: tolerated well, no immediate complications Procedure, treatment alternatives, risks and benefits explained, specific risks discussed. Consent was given by the patient. Immediately prior to procedure a time out was called to verify the correct patient, procedure, equipment, support staff and site/side marked as required. Patient was prepped and draped in the usual sterile fashion.     Bo Merino, MD

## 2017-09-26 ENCOUNTER — Other Ambulatory Visit: Payer: Self-pay | Admitting: Obstetrics & Gynecology

## 2017-09-26 DIAGNOSIS — Z1231 Encounter for screening mammogram for malignant neoplasm of breast: Secondary | ICD-10-CM

## 2017-10-06 ENCOUNTER — Ambulatory Visit
Admission: RE | Admit: 2017-10-06 | Discharge: 2017-10-06 | Disposition: A | Discharge status: Home / Self Care | Payer: Medicare Other | Source: Ambulatory Visit | Attending: Obstetrics & Gynecology | Admitting: Obstetrics & Gynecology

## 2017-10-06 DIAGNOSIS — Z1231 Encounter for screening mammogram for malignant neoplasm of breast: Secondary | ICD-10-CM

## 2017-10-10 ENCOUNTER — Encounter (HOSPITAL_COMMUNITY): Payer: Medicare Other

## 2017-11-21 ENCOUNTER — Telehealth: Payer: Self-pay | Admitting: Rheumatology

## 2017-11-21 NOTE — Progress Notes (Signed)
She is taking Plaquenil 200 mg twice daily M-F.  Last PLQ eye exam normal on 04/05/17.  Most recent CBC/CMP stable on 07/20/17.  Due for CBC/CMP in December and then every 5 months. Recommend flu, Pneumovax 23, and Prevnar 13 as indicated.

## 2017-11-21 NOTE — Telephone Encounter (Signed)
Patient called stating the Euflexxa injections only helped for a short period of time.  Patient is requesting cortisone injections in both knees to help with the pain.  Please advise

## 2017-11-21 NOTE — Telephone Encounter (Signed)
Patient states she she is having aching in both knees. Patient states it started with the weather change. Patient has been scheduled for 11/23/17 at 3:20 pm.

## 2017-11-23 ENCOUNTER — Encounter: Payer: Self-pay | Admitting: Physician Assistant

## 2017-11-23 ENCOUNTER — Ambulatory Visit (INDEPENDENT_AMBULATORY_CARE_PROVIDER_SITE_OTHER): Payer: Medicare Other | Admitting: Rheumatology

## 2017-11-23 VITALS — BP 124/68 | HR 77 | Resp 12 | Ht 65.0 in | Wt 159.8 lb

## 2017-11-23 DIAGNOSIS — Z79899 Other long term (current) drug therapy: Secondary | ICD-10-CM

## 2017-11-23 DIAGNOSIS — M170 Bilateral primary osteoarthritis of knee: Secondary | ICD-10-CM | POA: Diagnosis not present

## 2017-11-23 DIAGNOSIS — M19042 Primary osteoarthritis, left hand: Secondary | ICD-10-CM

## 2017-11-23 DIAGNOSIS — M359 Systemic involvement of connective tissue, unspecified: Secondary | ICD-10-CM | POA: Diagnosis not present

## 2017-11-23 DIAGNOSIS — M19041 Primary osteoarthritis, right hand: Secondary | ICD-10-CM

## 2017-11-23 DIAGNOSIS — Z853 Personal history of malignant neoplasm of breast: Secondary | ICD-10-CM

## 2017-11-23 DIAGNOSIS — Z8639 Personal history of other endocrine, nutritional and metabolic disease: Secondary | ICD-10-CM

## 2017-11-23 DIAGNOSIS — Z8679 Personal history of other diseases of the circulatory system: Secondary | ICD-10-CM

## 2017-11-23 DIAGNOSIS — M19072 Primary osteoarthritis, left ankle and foot: Secondary | ICD-10-CM

## 2017-11-23 DIAGNOSIS — M19071 Primary osteoarthritis, right ankle and foot: Secondary | ICD-10-CM

## 2017-11-23 MED ORDER — TRIAMCINOLONE ACETONIDE 40 MG/ML IJ SUSP
40.0000 mg | INTRAMUSCULAR | Status: AC | PRN
  Administered 2017-11-23: 40 mg via INTRA_ARTICULAR

## 2017-11-23 MED ORDER — LIDOCAINE HCL 1 % IJ SOLN
1.5000 mL | INTRAMUSCULAR | Status: AC | PRN
  Administered 2017-11-23: 1.5 mL

## 2017-11-23 NOTE — Progress Notes (Signed)
Office Visit Note  Patient: Yolanda Peters             Date of Birth: [birth year]           MRN: [id number]             PCP: Shon Baton, MD Referring: Shon Baton, MD Visit Date: 11/23/2017 Occupation: @GUAROCC @  Subjective:  Stiffness in bilateral knees.   History of Present Illness: Yolanda Peters is a 77 y.o. female history of autoimmune disease and osteoarthritis.  She states she has been having increasing stiffness in her knee joints.  She would like to have cortisone injection again.  She has osteoarthritis in her hands and feet which is not very bothersome currently.  He denies any joint swelling.  She was off Plaquenil as the last visit.  But she decided to resume Plaquenil again in October as her knees were causing discomfort.  Activities of Daily Living:  Patient reports morning stiffness for 0 minute.   Patient Denies nocturnal pain.  Difficulty dressing/grooming: Denies Difficulty climbing stairs: Reports Difficulty getting out of chair: Reports Difficulty using hands for taps, buttons, cutlery, and/or writing: Denies  Review of Systems  Constitutional: Negative for fatigue, night sweats, weight gain and weight loss.  HENT: Negative for mouth sores, trouble swallowing, trouble swallowing, mouth dryness and nose dryness.   Eyes: Negative for pain, redness, visual disturbance and dryness.  Respiratory: Negative for cough, shortness of breath and difficulty breathing.   Cardiovascular: Negative for chest pain, palpitations, hypertension, irregular heartbeat and swelling in legs/feet.  Gastrointestinal: Negative for blood in stool, constipation and diarrhea.  Endocrine: Negative for increased urination.  Genitourinary: Negative for vaginal dryness.  Musculoskeletal: Positive for arthralgias and joint pain. Negative for joint swelling, myalgias, muscle weakness, morning stiffness, muscle tenderness and myalgias.  Skin: Negative for color change, rash, hair loss, skin  tightness, ulcers and sensitivity to sunlight.  Allergic/Immunologic: Negative for susceptible to infections.  Neurological: Negative for dizziness, memory loss, night sweats and weakness.  Hematological: Negative for swollen glands.  Psychiatric/Behavioral: Negative for depressed mood and sleep disturbance. The patient is not nervous/anxious.     PMFS History:  Patient Active Problem List   Diagnosis Date Noted  . Autoimmune disease ([place]) 03/15/2016  . High risk medication use 03/15/2016  . Pain in joint of right shoulder 03/15/2016  . Bilateral hip pain 03/15/2016  . Primary osteoarthritis of both knees 03/15/2016  . 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 Arthritis 02/24/2014  . HTN (hypertension) 02/24/2014  . Hyperlipidemia 02/24/2014  . Breast cancer, right, upper outer quadrant  07/05/2010    Past Medical History:  Diagnosis Date  . Allergy   . Arthritis   . Breast cancer [facility]) 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  . Cancer ([place])    right breast   . Hyperlipidemia    takes Niacin  . Hypertension   . Personal history of radiation therapy 2011   rt breast    Family History  Problem Relation Age of Onset  . Cancer Mother        bladder  . Cancer Father        leukemia  . Cancer Maternal Aunt        breast  . Colon cancer Paternal Aunt   . Colon cancer Cousin   . Esophageal cancer Neg Hx   . Rectal cancer Neg Hx   . Stomach cancer Neg Hx    Past Surgical History:  Procedure Laterality  Date  . BREAST DUCTAL SYSTEM EXCISION     right breast  . BREAST LUMPECTOMY    . BREAST SURGERY  august 2011   right partial mastectomy  . CATARACT EXTRACTION     bilateral  . CESAREAN SECTION    . ELBOW SURGERY     left  . MYOMECTOMY     Social History   Social History Narrative  . Not on file    Objective: Vital Signs: BP 124/68 (BP Location: Left Arm, Patient Position: Sitting, Cuff Size: Normal)   Pulse 77   Resp 12   Ht 5\' 5"  (1.651 m)   Wt 159 lb 12.8 oz (72.5 kg)    BMI 26.59 kg/m    Physical Exam  Constitutional: She is oriented to person, place, and time. She appears well-developed and well-nourished.  HENT:  Head: Normocephalic and atraumatic.  Eyes: Conjunctivae and EOM are normal.  Neck: Normal range of motion.  Cardiovascular: Normal rate, regular rhythm, normal heart sounds and intact distal pulses.  Pulmonary/Chest: Effort normal and breath sounds normal.  Abdominal: Soft. Bowel sounds are normal.  Lymphadenopathy:    She has no cervical adenopathy.  Neurological: She is alert and oriented to person, place, and time.  Skin: Skin is warm and dry. Capillary refill takes less than 2 seconds.  Psychiatric: She has a normal mood and affect. Her behavior is normal.  Nursing note and vitals reviewed.    Musculoskeletal Exam: C-spine thoracic lumbar spine good range of motion.  Shoulder joints elbow joints wrist joints were in good range of motion.  She has severe PIP and DIP arthritis with PIP subluxations.  Hip joints were in good range of motion.  She had bilateral knee joint valgus deformity and crepitus.  No warmth swelling or effusion was noted.  CDAI Exam: CDAI Score: Not documented Patient Global Assessment: Not documented; Provider Global Assessment: Not documented Swollen: Not documented; Tender: Not documented Joint Exam   Not documented   There is currently no information documented on the homunculus. Go to the Rheumatology activity and complete the homunculus joint exam.  Investigation: No additional findings.  Imaging: No results found.  Recent Labs: Lab Results  Component Value Date   WBC 7.2 07/20/2017   HGB 14.8 07/20/2017   PLT 287 07/20/2017   NA 140 07/20/2017   K 4.9 07/20/2017   CL 102 07/20/2017   CO2 30 07/20/2017   GLUCOSE 77 07/20/2017   BUN 21 07/20/2017   CREATININE 0.85 07/20/2017   BILITOT 1.3 (H) 07/20/2017   ALKPHOS 87 08/17/2016   AST 18 07/20/2017   ALT 21 07/20/2017   PROT 6.6 07/20/2017     ALBUMIN 4.2 08/17/2016   CALCIUM 9.4 07/20/2017   GFRAA 77 07/20/2017    Speciality Comments: [facility]  Procedures:  Large Joint Inj: bilateral knee on 11/23/2017 3:48 PM Indications: pain Details: 27 G 1.5 in needle, medial approach  Arthrogram: No  Medications (Right): 1.5 mL lidocaine 1 %; 40 mg triamcinolone acetonide 40 MG/ML Aspirate (Right): 0 mL Medications (Left): 1.5 mL lidocaine 1 %; 40 mg triamcinolone acetonide 40 MG/ML Aspirate (Left): 0 mL Outcome: tolerated well, no immediate complications Procedure, treatment alternatives, risks and benefits explained, specific risks discussed. Consent was given by the patient. Immediately prior to procedure a time out was called to verify the correct patient, procedure, equipment, support staff and site/side marked as required. Patient was prepped and draped  in the usual sterile fashion.     Allergies: Penicillins   Assessment / Plan:     Visit Diagnoses: Autoimmune disease ([place]) - +ANA low titer, dsDNA negative, history of inflammatory arthritis.  Patient came off Plaquenil after the last visit and resumed in October as she felt she was having increased pain.  She wants to stay on 1 tablet of Plaquenil a day.  High risk medication use - PLQ 200 mg p.o. daily eye exam: 04/05/2017.  Her labs have been stable.  We will check labs at the follow-up visit.  Primary osteoarthritis of both hands - she has severe osteoarthritis with subluxation of PIPs and CMC's.  Protection muscle strengthening was discussed.  Primary osteoarthritis of both knees - With right knee joint valgus deformity.  She went to see Dr. Alvan Dame and she decided not to go with surgery. s/p euflexxa 09/2017 patient did not have as good relief as Euflexxa compared to cortisone.  Per her request bilateral knee joints were injected with cortisone today.  Side effects were discussed.  Post injection precautions were  discussed.  Primary osteoarthritis of both feet-she has been using proper fitting shoes which is been helpful.  History of hyperlipidemia  History of breast cancer  History of hypertension   Orders: Orders Placed This Encounter  Procedures  . Large Joint Inj   No orders of the defined types were placed in this encounter.    Follow-Up Instructions: Return in about 5 months (around 04/24/2018) for Autoimmune disease, Osteoarthritis.   Bo Merino, MD  Note - This record has been created using Editor, commissioning.  Chart creation errors have been sought, but may not always  have been located. Such creation errors do not reflect on  the standard of medical care.

## 2017-11-28 ENCOUNTER — Other Ambulatory Visit: Payer: Self-pay | Admitting: Rheumatology

## 2017-11-28 NOTE — Telephone Encounter (Signed)
Last Visit: 11/23/17 Next Visit: 02/20/18 Labs: 07/20/17 stable PLQ Eye Exam: 04/05/17 WNL  Okay to refill per Dr. Estanislado Pandy

## 2018-01-19 NOTE — Progress Notes (Signed)
Office Visit Note  Patient: Yolanda Peters             Date of Birth: [birth year]           MRN: [id number]             PCP: Shon Baton, MD Referring: Shon Baton, MD Visit Date: 01/22/2018 Occupation: @GUAROCC @  Subjective:  Increased knee pain.  History of Present Illness: Yolanda Peters is a 78 y.o. female with history of autoimmune disease and osteoarthritis.  She is on PLQ 200 mg daily.  According to patient with the weather change she has been experiencing increased knee joint discomfort.  None of the other joints are as painful.  She does have some underlying osteoarthritis in her hands and feet.  Denies any joint swelling.  Activities of Daily Living:  Patient reports morning stiffness for 0 minutes.   Patient Denies nocturnal pain.  Difficulty dressing/grooming: Denies Difficulty climbing stairs: Reports Difficulty getting out of chair: Reports Difficulty using hands for taps, buttons, cutlery, and/or writing: Denies  Review of Systems  Constitutional: Negative for fatigue, night sweats, weight gain and weight loss.  HENT: Negative for mouth sores, trouble swallowing, trouble swallowing, mouth dryness and nose dryness.   Eyes: Negative for pain, redness, visual disturbance and dryness.  Respiratory: Negative for cough, hemoptysis, shortness of breath and difficulty breathing.   Cardiovascular: Negative for chest pain, palpitations, hypertension, irregular heartbeat and swelling in legs/feet.  Gastrointestinal: Negative for blood in stool, constipation and diarrhea.  Endocrine: Negative for increased urination.  Genitourinary: Negative for painful urination and vaginal dryness.  Musculoskeletal: Positive for arthralgias and joint pain. Negative for joint swelling, myalgias, muscle weakness, morning stiffness, muscle tenderness and myalgias.  Skin: Negative for color change, pallor, rash, hair loss, nodules/bumps, skin tightness, ulcers and sensitivity to sunlight.    Allergic/Immunologic: Negative for susceptible to infections.  Neurological: Negative for dizziness, numbness, headaches, memory loss, night sweats and weakness.  Hematological: Negative for swollen glands.  Psychiatric/Behavioral: Negative for depressed mood and sleep disturbance. The patient is not nervous/anxious.     PMFS History:  Patient Active Problem List   Diagnosis Date Noted  . Autoimmune disease ([place]) 03/15/2016  . High risk medication use 03/15/2016  . Pain in joint of right shoulder 03/15/2016  . Bilateral hip pain 03/15/2016  . Primary osteoarthritis of both knees 03/15/2016  . 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 Arthritis 02/24/2014  . HTN (hypertension) 02/24/2014  . Hyperlipidemia 02/24/2014  . Breast cancer, right, upper outer quadrant  07/05/2010    Past Medical History:  Diagnosis Date  . Allergy   . Arthritis   . Breast cancer [facility]) 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  . Cancer ([place])    right breast   . Hyperlipidemia    takes Niacin  . Hypertension   . Personal history of radiation therapy 2011   rt breast    Family History  Problem Relation Age of Onset  . Cancer Mother        bladder  . Cancer Father        leukemia  . Cancer Maternal Aunt        breast  . Colon cancer Paternal Aunt   . Colon cancer Cousin   . Esophageal cancer Neg Hx   . Rectal cancer Neg Hx   . Stomach cancer Neg Hx    Past Surgical History:  Procedure Laterality Date  . BREAST DUCTAL SYSTEM EXCISION  right breast  . BREAST LUMPECTOMY    . BREAST SURGERY  august 2011   right partial mastectomy  . CATARACT EXTRACTION     bilateral  . CESAREAN SECTION    . ELBOW SURGERY     left  . MYOMECTOMY     Social History   Social History Narrative  . Not on file   Immunization History  Administered Date(s) Administered  . Influenza Split 09/24/2013, 09/24/2014  . Tdap 08/28/2014  . Zoster Recombinat (Shingrix) 10/12/2017     Objective: Vital Signs: BP 124/86 (BP  Location: Left Arm, Patient Position: Sitting, Cuff Size: Normal)   Pulse 77   Resp 13   Ht 5\' 5"  (1.651 m)   Wt 159 lb 9.6 oz (72.4 kg)   BMI 26.56 kg/m    Physical Exam Vitals signs and nursing note reviewed.  Constitutional:      Appearance: She is well-developed.  HENT:     Head: Normocephalic and atraumatic.  Eyes:     Conjunctiva/sclera: Conjunctivae normal.  Neck:     Musculoskeletal: Normal range of motion.  Cardiovascular:     Rate and Rhythm: Normal rate and regular rhythm.     Heart sounds: Normal heart sounds.  Pulmonary:     Effort: Pulmonary effort is normal.     Breath sounds: Normal breath sounds.  Abdominal:     General: Bowel sounds are normal.     Palpations: Abdomen is soft.  Lymphadenopathy:     Cervical: No cervical adenopathy.  Skin:    General: Skin is warm and dry.     Capillary Refill: Capillary refill takes less than 2 seconds.  Neurological:     Mental Status: She is alert and oriented to person, place, and time.  Psychiatric:        Behavior: Behavior normal.      Musculoskeletal Exam: C-spine good range of motion.  She has thoracic kyphosis.  Some limitation range of motion of lumbar spine.  Shoulder joints elbow joints wrist joints with good range of motion.  She has severe DIP and PIP thickening with subluxation of several of her PIP joints.  Hip joints were in good range of motion.  She has discomfort with range of motion of bilateral knee joints.  She has valgus deformity in her right knee.  CDAI Exam: CDAI Score: Not documented Patient Global Assessment: Not documented; Provider Global Assessment: Not documented Swollen: Not documented; Tender: Not documented Joint Exam   Not documented   There is currently no information documented on the homunculus. Go to the Rheumatology activity and complete the homunculus joint exam.  Investigation: No additional findings.  Imaging: No results found.  Recent Labs: Lab Results    Component Value Date   WBC 7.2 07/20/2017   HGB 14.8 07/20/2017   PLT 287 07/20/2017   NA 140 07/20/2017   K 4.9 07/20/2017   CL 102 07/20/2017   CO2 30 07/20/2017   GLUCOSE 77 07/20/2017   BUN 21 07/20/2017   CREATININE 0.85 07/20/2017   BILITOT 1.3 (H) 07/20/2017   ALKPHOS 87 08/17/2016   AST 18 07/20/2017   ALT 21 07/20/2017   PROT 6.6 07/20/2017   ALBUMIN 4.2 08/17/2016   CALCIUM 9.4 07/20/2017   GFRAA 77 07/20/2017    Speciality Comments: PL[facility]  Procedures:  Large Joint Inj: bilateral knee on 01/22/2018 12:01 PM Indications: pain Details: 27 G 1.5 in needle, medial  approach  Arthrogram: No  Medications (Right): 1.5 mL lidocaine 1 %; 40 mg triamcinolone acetonide 40 MG/ML Medications (Left): 1.5 mL lidocaine 1 %; 40 mg triamcinolone acetonide 40 MG/ML Outcome: tolerated well, no immediate complications Procedure, treatment alternatives, risks and benefits explained, specific risks discussed. Consent was given by the patient. Immediately prior to procedure a time out was called to verify the correct patient, procedure, equipment, support staff and site/side marked as required. Patient was prepped and draped in the usual sterile fashion.     Allergies: Penicillins   Assessment / Plan:     Visit Diagnoses: Autoimmune disease ([place]) - +ANA low titer, dsDNA negative, history of inflammatory arthritis. -She is doing really well as regards to autoimmune disease.  She has been taking Plaquenil only 1 tablet every day.  We will check her labs today if her labs are stable we will reduce Plaquenil to every other day.  Plan: CBC with Differential/Platelet, COMPLETE METABOLIC PANEL WITH GFR, Urinalysis, Routine w reflex microscopic, Anti-DNA antibody, double-stranded, C3 and C4, Sedimentation rate  High risk medication use - PLQ 200 mg p.o. daily. eye exam: 04/05/2017.  - Plan: CBC with Differential/Platelet, COMPLETE  METABOLIC PANEL WITH GFR today and then every 5 months  Primary osteoarthritis of both hands-she has severe osteoarthritis in her bilateral hands with PIP subluxation.  Primary osteoarthritis of both knees - With right knee joint valgus deformity.  She went to see Dr. Alvan Dame and she decided not to go with surgery. s/p euflexxa 09/2017.  She has been having increased pain and discomfort in her bilateral knee joints.  Per her request after informed consent was obtained bilateral knee joints were injected with cortisone which she tolerated well.  Side effects were discussed at length.  The procedure as described above.  Primary osteoarthritis of both feet-she has discomfort in her bilateral feet due to arthritis.  History of breast cancer  History of hyperlipidemia  History of hypertension -her blood pressure is under control.  Have advised her to monitor blood pressure after cortisone injection.  Patient states that she has been getting DEXA through her PCP.  I do not have results to review.  Orders: Orders Placed This Encounter  Procedures  . CBC with Differential/Platelet  . COMPLETE METABOLIC PANEL WITH GFR  . Urinalysis, Routine w reflex microscopic  . Anti-DNA antibody, double-stranded  . C3 and C4  . Sedimentation rate   No orders of the defined types were placed in this encounter.   Face-to-face time spent with patient was 30 minutes. Greater than 50% of time was spent in counseling and coordination of care.  Follow-Up Instructions: Return in about 5 months (around 06/23/2018) for Autoimmune disease, Osteoarthritis.   Bo Merino, MD  Note - This record has been created using Editor, commissioning.  Chart creation errors have been sought, but may not always  have been located. Such creation errors do not reflect on  the standard of medical care.

## 2018-01-22 ENCOUNTER — Encounter: Payer: Self-pay | Admitting: Rheumatology

## 2018-01-22 ENCOUNTER — Ambulatory Visit (INDEPENDENT_AMBULATORY_CARE_PROVIDER_SITE_OTHER): Payer: Medicare Other | Admitting: Rheumatology

## 2018-01-22 VITALS — BP 124/86 | HR 77 | Resp 13 | Ht 65.0 in | Wt 159.6 lb

## 2018-01-22 DIAGNOSIS — M170 Bilateral primary osteoarthritis of knee: Secondary | ICD-10-CM | POA: Diagnosis not present

## 2018-01-22 DIAGNOSIS — M19071 Primary osteoarthritis, right ankle and foot: Secondary | ICD-10-CM

## 2018-01-22 DIAGNOSIS — Z79899 Other long term (current) drug therapy: Secondary | ICD-10-CM | POA: Diagnosis not present

## 2018-01-22 DIAGNOSIS — M19041 Primary osteoarthritis, right hand: Secondary | ICD-10-CM | POA: Diagnosis not present

## 2018-01-22 DIAGNOSIS — M359 Systemic involvement of connective tissue, unspecified: Secondary | ICD-10-CM | POA: Diagnosis not present

## 2018-01-22 DIAGNOSIS — M19072 Primary osteoarthritis, left ankle and foot: Secondary | ICD-10-CM

## 2018-01-22 DIAGNOSIS — Z853 Personal history of malignant neoplasm of breast: Secondary | ICD-10-CM

## 2018-01-22 DIAGNOSIS — M19042 Primary osteoarthritis, left hand: Secondary | ICD-10-CM

## 2018-01-22 DIAGNOSIS — Z8639 Personal history of other endocrine, nutritional and metabolic disease: Secondary | ICD-10-CM

## 2018-01-22 DIAGNOSIS — Z8679 Personal history of other diseases of the circulatory system: Secondary | ICD-10-CM

## 2018-01-22 MED ORDER — LIDOCAINE HCL 1 % IJ SOLN
1.5000 mL | INTRAMUSCULAR | Status: AC | PRN
  Administered 2018-01-22: 1.5 mL

## 2018-01-22 MED ORDER — TRIAMCINOLONE ACETONIDE 40 MG/ML IJ SUSP
40.0000 mg | INTRAMUSCULAR | Status: AC | PRN
  Administered 2018-01-22: 40 mg via INTRA_ARTICULAR

## 2018-01-23 NOTE — Progress Notes (Signed)
CBC and CMP WNL.  Sed rate WNL.

## 2018-01-23 NOTE — Progress Notes (Signed)
DsDNA is <1.

## 2018-01-24 LAB — COMPLETE METABOLIC PANEL WITH GFR
AG Ratio: 2 (calc) (ref 1.0–2.5)
ALT: 18 U/L (ref 6–29)
AST: 17 U/L (ref 10–35)
Albumin: 4 g/dL (ref 3.6–5.1)
Alkaline phosphatase (APISO): 70 U/L (ref 33–130)
BUN: 23 mg/dL (ref 7–25)
CO2: 31 mmol/L (ref 20–32)
Calcium: 9 mg/dL (ref 8.6–10.4)
Chloride: 104 mmol/L (ref 98–110)
Creat: 0.9 mg/dL (ref 0.60–0.93)
GFR, Est African American: 72 mL/min/{1.73_m2} (ref >=60)
GFR, Est Non African American: 62 mL/min/{1.73_m2} (ref >=60)
Globulin: 2 g/dL (calc) (ref 1.9–3.7)
Glucose, Bld: 80 mg/dL (ref 65–99)
Potassium: 4.2 mmol/L (ref 3.5–5.3)
Sodium: 142 mmol/L (ref 135–146)
Total Bilirubin: 0.9 mg/dL (ref 0.2–1.2)
Total Protein: 6 g/dL — ABNORMAL LOW (ref 6.1–8.1)

## 2018-01-24 LAB — CBC WITH DIFFERENTIAL/PLATELET
Absolute Monocytes: 710 cells/uL (ref 200–950)
Basophils Absolute: 38 cells/uL (ref 0–200)
Basophils Relative: 0.6 %
Eosinophils Absolute: 230 cells/uL (ref 15–500)
Eosinophils Relative: 3.6 %
HCT: 44.3 % (ref 35.0–45.0)
Hemoglobin: 14.8 g/dL (ref 11.7–15.5)
Lymphs Abs: 1197 cells/uL (ref 850–3900)
MCH: 29 pg (ref 27.0–33.0)
MCHC: 33.4 g/dL (ref 32.0–36.0)
MCV: 86.9 fL (ref 80.0–100.0)
MPV: 9.8 fL (ref 7.5–12.5)
Monocytes Relative: 11.1 %
Neutro Abs: 4224 cells/uL (ref 1500–7800)
Neutrophils Relative %: 66 %
Platelets: 319 10*3/uL (ref 140–400)
RBC: 5.1 10*6/uL (ref 3.80–5.10)
RDW: 13.9 % (ref 11.0–15.0)
Total Lymphocyte: 18.7 %
WBC: 6.4 10*3/uL (ref 3.8–10.8)

## 2018-01-24 LAB — ANTI-DNA ANTIBODY, DOUBLE-STRANDED: ds DNA Ab: <1 IU/mL

## 2018-01-24 LAB — C3 AND C4
C3 Complement: 149 mg/dL (ref 83–193)
C4 Complement: 39 mg/dL (ref 15–57)

## 2018-01-24 LAB — SEDIMENTATION RATE: Sed Rate: 2 mm/h (ref 0–30)

## 2018-01-24 NOTE — Progress Notes (Signed)
Complements WNL

## 2018-01-30 ENCOUNTER — Telehealth (INDEPENDENT_AMBULATORY_CARE_PROVIDER_SITE_OTHER): Payer: Self-pay

## 2018-01-30 NOTE — Telephone Encounter (Signed)
Received PA request form from [facility] for Euflexxa series, bilateral knee. Completed form and faxed to [facility] at [phone].

## 2018-02-06 ENCOUNTER — Ambulatory Visit: Payer: Medicare Other | Admitting: Rheumatology

## 2018-02-07 ENCOUNTER — Telehealth: Payer: Self-pay | Admitting: Rheumatology

## 2018-02-07 NOTE — Telephone Encounter (Signed)
Attempted to contact the patient and left message for patient to call the office.  

## 2018-02-07 NOTE — Telephone Encounter (Signed)
Patient is ready for Euflexxa again. Prior auth. has expired. Per patient we need to go thru [facility] for rx. Please resubmit for authorization.

## 2018-02-08 NOTE — Telephone Encounter (Signed)
Patient returned call and she was advised that the Euflexxa has already been re-applied for and should be updated once response received.

## 2018-02-20 ENCOUNTER — Ambulatory Visit: Payer: Medicare Other | Admitting: Rheumatology

## 2018-02-21 ENCOUNTER — Telehealth (INDEPENDENT_AMBULATORY_CARE_PROVIDER_SITE_OTHER): Payer: Self-pay

## 2018-02-21 NOTE — Telephone Encounter (Signed)
Received a fax from [facility] stating that authorization for Euflexxa was denied due it not being 6 months since last Eufleexa injection on 09/15/2017.  Can apply for approval for authorization for Eufleexa  after 03/16/2018.

## 2018-03-21 ENCOUNTER — Telehealth: Payer: Self-pay | Admitting: Rheumatology

## 2018-03-21 NOTE — Telephone Encounter (Signed)
Patient left a voicemail checking the status of her Euflexxa injections.

## 2018-03-21 NOTE — Telephone Encounter (Signed)
Euflexxa, now ok to check for auth, see other message.

## 2018-03-23 ENCOUNTER — Encounter (INDEPENDENT_AMBULATORY_CARE_PROVIDER_SITE_OTHER): Payer: Self-pay | Admitting: Radiology

## 2018-03-23 NOTE — Progress Notes (Signed)
Can you please call patient and schedule Synvisc series injections with Deveshwar?   Insurance covers at 80% and there is no PA.  We can buy and bill.  Thanks.

## 2018-03-23 NOTE — Telephone Encounter (Signed)
Euflexxa denied, submitted for Synvisc VOB.

## 2018-03-26 NOTE — Progress Notes (Signed)
LMOM for patient to call and schedule Synvisc injections

## 2018-03-27 ENCOUNTER — Telehealth: Payer: Self-pay | Admitting: Rheumatology

## 2018-03-27 NOTE — Telephone Encounter (Signed)
Patient called stating she requested Euflexxa injections for her bilateral knee pain.  Patient states she has had these injections in the past and they are very effective.  Patient states she was told that the injections that were approved were Synvisc which she has never had before.  Patient is requesting a return call "to discuss the effectiveness of these injections" before she schedules the appointments.

## 2018-03-27 NOTE — Telephone Encounter (Signed)
Talked with patient and advised her that Euflexxa is no longer a preferred product for [facility] Medicare and that Synvisc is a series of 3 like Euflexxa.

## 2018-03-29 ENCOUNTER — Other Ambulatory Visit: Payer: Self-pay

## 2018-03-29 ENCOUNTER — Ambulatory Visit (INDEPENDENT_AMBULATORY_CARE_PROVIDER_SITE_OTHER): Payer: Medicare Other | Admitting: Rheumatology

## 2018-03-29 DIAGNOSIS — M170 Bilateral primary osteoarthritis of knee: Secondary | ICD-10-CM

## 2018-03-29 MED ORDER — LIDOCAINE HCL 1 % IJ SOLN
1.5000 mL | INTRAMUSCULAR | Status: AC | PRN
  Administered 2018-03-29: 1.5 mL

## 2018-03-29 MED ORDER — HYLAN G-F 20 16 MG/2ML IX SOSY
16.0000 mg | PREFILLED_SYRINGE | INTRA_ARTICULAR | Status: AC | PRN
  Administered 2018-03-29: 16 mg via INTRA_ARTICULAR

## 2018-03-29 NOTE — Progress Notes (Signed)
   Procedure Note  Patient: Yolanda Peters             Date of Birth: 11/11/1940           MRN: [id number]             Visit Date: 03/29/2018  Procedures: Visit Diagnoses: Primary osteoarthritis of both knees Synvisc #1 Bilateral knee joint injections  Large Joint Inj: bilateral knee on 03/29/2018 10:42 AM Indications: pain Details: 25 G 1.5 in needle, medial approach  Arthrogram: No  Medications (Right): 16 mg Hylan 16 MG/2ML; 1.5 mL lidocaine 1 % Aspirate (Right): 0 mL Medications (Left): 16 mg Hylan 16 MG/2ML; 1.5 mL lidocaine 1 % Aspirate (Left): 0 mL Outcome: tolerated well, no immediate complications Procedure, treatment alternatives, risks and benefits explained, specific risks discussed. Consent was given by the patient. Immediately prior to procedure a time out was called to verify the correct patient, procedure, equipment, support staff and site/side marked as required. Patient was prepped and draped in the usual sterile fashion.     Patient tolerated the procedure well.   Bo Merino, MD

## 2018-03-30 ENCOUNTER — Telehealth: Payer: Self-pay | Admitting: Pharmacy Technician

## 2018-03-30 NOTE — Telephone Encounter (Signed)
Spoke to patient and she states that she has stopped her Plaquenil. Patient said she stopped 01/2018.

## 2018-04-05 ENCOUNTER — Ambulatory Visit (INDEPENDENT_AMBULATORY_CARE_PROVIDER_SITE_OTHER): Payer: Medicare Other | Admitting: Rheumatology

## 2018-04-05 ENCOUNTER — Other Ambulatory Visit: Payer: Self-pay

## 2018-04-05 DIAGNOSIS — M170 Bilateral primary osteoarthritis of knee: Secondary | ICD-10-CM

## 2018-04-05 MED ORDER — LIDOCAINE HCL 1 % IJ SOLN
1.5000 mL | INTRAMUSCULAR | Status: AC | PRN
  Administered 2018-04-05: 1.5 mL

## 2018-04-05 MED ORDER — HYLAN G-F 20 16 MG/2ML IX SOSY
16.0000 mg | PREFILLED_SYRINGE | INTRA_ARTICULAR | Status: AC | PRN
  Administered 2018-04-05: 16 mg via INTRA_ARTICULAR

## 2018-04-05 NOTE — Progress Notes (Signed)
   Procedure Note  Patient: JEANETTE RAUTH             Date of Birth: 1940/08/09           MRN: [id number]             Visit Date: 04/05/2018  Procedures: Visit Diagnoses: Primary osteoarthritis of both knees - Plan: Large Joint Inj: bilateral knee Synvisc #2 bilateral knee joint injections B/B Large Joint Inj: bilateral knee on 04/05/2018 9:03 AM Indications: pain Details: 25 G 1.5 in needle, medial approach  Arthrogram: No  Medications (Right): 16 mg Hylan 16 MG/2ML; 1.5 mL lidocaine 1 % Aspirate (Right): 0 mL Medications (Left): 16 mg Hylan 16 MG/2ML; 1.5 mL lidocaine 1 % Aspirate (Left): 0 mL Outcome: tolerated well, no immediate complications Procedure, treatment alternatives, risks and benefits explained, specific risks discussed. Consent was given by the patient. Immediately prior to procedure a time out was called to verify the correct patient, procedure, equipment, support staff and site/side marked as required. Patient was prepped and draped in the usual sterile fashion.     Patient tolerated the procedure well.   Bo Merino, MD

## 2018-04-12 ENCOUNTER — Other Ambulatory Visit: Payer: Self-pay

## 2018-04-12 ENCOUNTER — Ambulatory Visit (INDEPENDENT_AMBULATORY_CARE_PROVIDER_SITE_OTHER): Payer: Medicare Other | Admitting: Rheumatology

## 2018-04-12 DIAGNOSIS — M170 Bilateral primary osteoarthritis of knee: Secondary | ICD-10-CM | POA: Diagnosis not present

## 2018-04-12 MED ORDER — LIDOCAINE HCL 1 % IJ SOLN
1.5000 mL | INTRAMUSCULAR | Status: AC | PRN
  Administered 2018-04-12: 1.5 mL

## 2018-04-12 MED ORDER — HYLAN G-F 20 16 MG/2ML IX SOSY
16.0000 mg | PREFILLED_SYRINGE | INTRA_ARTICULAR | Status: AC | PRN
  Administered 2018-04-12: 16 mg via INTRA_ARTICULAR

## 2018-04-12 NOTE — Progress Notes (Signed)
   Procedure Note  Patient: Yolanda Peters             Date of Birth: 1940/03/08           MRN: [id number]             Visit Date: 04/12/2018  Procedures: Visit Diagnoses: Primary osteoarthritis of both knees - Plan: Large Joint Inj: bilateral knee Synvisc #3 Bilateral knee joint injections B/B Large Joint Inj: bilateral knee on 04/12/2018 8:07 AM Indications: pain Details: 25 G 1.5 in needle, medial approach  Arthrogram: No  Medications (Right): 16 mg Hylan 16 MG/2ML; 1.5 mL lidocaine 1 % Aspirate (Right): 0 mL Medications (Left): 16 mg Hylan 16 MG/2ML; 1.5 mL lidocaine 1 % Aspirate (Left): 0 mL Outcome: tolerated well, no immediate complications Procedure, treatment alternatives, risks and benefits explained, specific risks discussed. Consent was given by the patient. Immediately prior to procedure a time out was called to verify the correct patient, procedure, equipment, support staff and site/side marked as required. Patient was prepped and draped in the usual sterile fashion.      Bo Merino, MD

## 2018-06-11 NOTE — Progress Notes (Deleted)
Office Visit Note  Patient: Yolanda Peters             Date of Birth: 27-Apr-1940           MRN: [id number]             PCP: Shon Baton, MD Referring: Shon Baton, MD Visit Date: 06/25/2018 Occupation: @GUAROCC @  Subjective:  No chief complaint on file.  Plaquenil 200 mg twice daily Monday through Friday only.  Last Plaquenil eye exam normal on 04/05/2017.  Most recent CBC/CMP within normal limits on 01/22/2018.  Due for CBC/CMP today and will monitor every 5 months.  History of Present Illness: Yolanda Peters is a 78 y.o. female ***   Activities of Daily Living:  Patient reports morning stiffness for *** {minute/hour:19697}.   Patient {ACTIONS;DENIES/REPORTS:21021675::"Denies"} nocturnal pain.  Difficulty dressing/grooming: {ACTIONS;DENIES/REPORTS:21021675::"Denies"} Difficulty climbing stairs: {ACTIONS;DENIES/REPORTS:21021675::"Denies"} Difficulty getting out of chair: {ACTIONS;DENIES/REPORTS:21021675::"Denies"} Difficulty using hands for taps, buttons, cutlery, and/or writing: {ACTIONS;DENIES/REPORTS:21021675::"Denies"}  No Rheumatology ROS completed.   PMFS History:  Patient Active Problem List   Diagnosis Date Noted  . Autoimmune disease ([place]) 03/15/2016  . High risk medication use 03/15/2016  . Pain in joint of right shoulder 03/15/2016  . Bilateral hip pain 03/15/2016  . Primary osteoarthritis of both knees 03/15/2016  . 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 Arthritis 02/24/2014  . HTN (hypertension) 02/24/2014  . Hyperlipidemia 02/24/2014  . Breast cancer, right, upper outer quadrant  07/05/2010    Past Medical History:  Diagnosis Date  . Allergy   . Arthritis   . Breast cancer [facility]) 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  . Cancer ([place])    right breast   . Hyperlipidemia    takes Niacin  . Hypertension   . Personal history of radiation therapy 2011   rt breast    Family History  Problem Relation Age of Onset  . Cancer Mother        bladder  . Cancer Father      leukemia  . Cancer Maternal Aunt        breast  . Colon cancer Paternal Aunt   . Colon cancer Cousin   . Esophageal cancer Neg Hx   . Rectal cancer Neg Hx   . Stomach cancer Neg Hx    Past Surgical History:  Procedure Laterality Date  . BREAST DUCTAL SYSTEM EXCISION     right breast  . BREAST LUMPECTOMY    . BREAST SURGERY  august 2011   right partial mastectomy  . CATARACT EXTRACTION     bilateral  . CESAREAN SECTION    . ELBOW SURGERY     left  . MYOMECTOMY     Social History   Social History Narrative  . Not on file   Immunization History  Administered Date(s) Administered  . Influenza Split 09/24/2013, 09/24/2014  . Tdap 08/28/2014  . Zoster Recombinat (Shingrix) 10/12/2017     Objective: Vital Signs: There were no vitals taken for this visit.   Physical Exam   Musculoskeletal Exam: ***  CDAI Exam: CDAI Score: Not documented Patient Global Assessment: Not documented; Provider Global Assessment: Not documented Swollen: Not documented; Tender: Not documented Joint Exam   Not documented   There is currently no information documented on the homunculus. Go to the Rheumatology activity and complete the homunculus joint exam.  Investigation: No additional findings.  Imaging: No results found.  Recent Labs: Lab Results  Component Value Date   WBC 6.4 01/22/2018  HGB 14.8 01/22/2018   PLT 319 01/22/2018   NA 142 01/22/2018   K 4.2 01/22/2018   CL 104 01/22/2018   CO2 31 01/22/2018   GLUCOSE 80 01/22/2018   BUN 23 01/22/2018   CREATININE 0.90 01/22/2018   BILITOT 0.9 01/22/2018   ALKPHOS 87 08/17/2016   AST 17 01/22/2018   ALT 18 01/22/2018   PROT 6.0 (L) 01/22/2018   ALBUMIN 4.2 08/17/2016   CALCIUM 9.0 01/22/2018   GFRAA 72 01/22/2018    Speciality Comments: [facility]  Procedures:  No procedures performed Allergies: Penicillins   Assessment / Plan:     Visit Diagnoses:  Autoimmune disease ([place])  High risk medication use  Primary osteoarthritis of both hands  Primary osteoarthritis of both knees  Primary osteoarthritis of both feet  History of breast cancer  History of hyperlipidemia  History of hypertension  Essential hypertension   Orders: No orders of the defined types were placed in this encounter.  No orders of the defined types were placed in this encounter.   Face-to-face time spent with patient was *** minutes. Greater than 50% of time was spent in counseling and coordination of care.  Follow-Up Instructions: No follow-ups on file.   Ofilia Neas, PA-C  Note - This record has been created using Dragon software.  Chart creation errors have been sought, but may not always  have been located. Such creation errors do not reflect on  the standard of medical care.

## 2018-06-17 ENCOUNTER — Encounter: Payer: Self-pay | Admitting: Rheumatology

## 2018-06-17 NOTE — H&P (Signed)
TOTAL KNEE ADMISSION H&P  Patient is being admitted for right total knee arthroplasty.  Subjective:  Chief Complaint:   Right knee primary OA / pain  HPI: Yolanda Peters, 78 y.o. female, has a history of pain and functional disability in the right knee due to arthritis and has failed non-surgical conservative treatments for greater than 12 weeks to include NSAID's and/or analgesics, corticosteriod injections, viscosupplementation injections, use of assistive devices and activity modification.  Onset of symptoms was gradual, starting 7 years ago with gradually worsening course since that time. The patient noted no past surgery on the right knee(s).  Patient currently rates pain (achy) in the right knee(s) at 8 out of 10 with activity. Patient has worsening of pain with activity and weight bearing, pain that interferes with activities of daily living, pain with passive range of motion, crepitus and joint swelling.  Patient has evidence of periarticular osteophytes and joint space narrowing by imaging studies.  There is no active infection.  Risks, benefits and expectations were discussed with the patient.  Risks including but not limited to the risk of anesthesia, blood clots, nerve damage, blood vessel damage, failure of the prosthesis, infection and up to and including death.  Patient understand the risks, benefits and expectations and wishes to proceed with surgery.   PCP: Shon Baton, MD  D/C Plans:       Home   Post-op Meds:       No Rx given   Tranexamic Acid:      To be given - IV   Decadron:      Is to be given  FYI:      ASA  Norco  DME:    Rx given for - RW   PT:   OPPT   Pharm: [facility]  [address]   Patient Active Problem List   Diagnosis Date Noted  . Autoimmune disease ([place]) 03/15/2016  . High risk medication use 03/15/2016  . Pain in joint of right shoulder 03/15/2016  . Bilateral hip pain 03/15/2016  . Primary osteoarthritis of both knees 03/15/2016  . Fat necrosis of  female breast 11/25/2014  . Arthritis 02/24/2014  . HTN (hypertension) 02/24/2014  . Hyperlipidemia 02/24/2014  . Breast cancer, right, upper outer quadrant  07/05/2010   Past Medical History:  Diagnosis Date  . Allergy   . Arthritis   . Breast cancer [facility]) 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  . Cancer ([place])    right breast   . Hyperlipidemia    takes Niacin  . Hypertension   . Personal history of radiation therapy 2011   rt breast    Past Surgical History:  Procedure Laterality Date  . BREAST DUCTAL SYSTEM EXCISION     right breast  . BREAST LUMPECTOMY    . BREAST SURGERY  august 2011   right partial mastectomy  . CATARACT EXTRACTION     bilateral  . CESAREAN SECTION    . ELBOW SURGERY     left  . MYOMECTOMY      No current facility-administered medications for this encounter.    Current Outpatient Medications  Medication Sig Dispense Refill Last Dose  . Ascorbic Acid (VITAMIN C) 1000 MG tablet Take 1,000 mg by mouth daily.   Taking  . beta carotene 10000 UNIT capsule Take 10,000 Units by mouth daily.     Taking  . BETA CAROTENE PO Take 7,500 mcg by mouth daily.   Not Taking  . CALCIUM & MAGNESIUM CARBONATES PO  Take 2 tablets by mouth daily.   Taking  . Cholecalciferol (VITAMIN D-3 PO) Take 2,000 Units by mouth daily.   Taking  . hydrochlorothiazide (HYDRODIURIL) 25 MG tablet Take 25 mg by mouth as needed.    Taking  . hydroxychloroquine (PLAQUENIL) 200 MG tablet TAKE 1 TABLET TWICE A DAY ON MONDAY THROUGH FRIDAY (Patient taking differently: daily. TAKE 1 TABLET TWICE A DAY ON MONDAY THROUGH FRIDAY) 120 tablet 0 Taking  . loratadine (CLARITIN) 10 MG tablet Take 10 mg by mouth daily.   Taking  . Misc Natural Products (TART CHERRY ADVANCED PO) Take by mouth as needed. 3 pills   Not Taking  . niacin 100 MG tablet Take 500 mg by mouth as needed.    Not Taking  . NON FORMULARY Bio freeze- uses PRN   Taking  . Probiotic Product (PROBIOTIC DAILY PO) Take by mouth daily.   Taking  . Sodium  Hyaluronate, Viscosup, (HYALGAN) 20 MG/2ML SOLN Inject 1 prefilled syringe into each knee weekly for 5 weeks at provider's office. (Patient not taking: Reported on 03/21/2017) 10 Syringe 3 Not Taking  . trimethoprim (TRIMPEX) 100 MG tablet Take 100 mg by mouth daily.   Taking  . TURMERIC PO Take by mouth.   Not Taking  . verapamil (VERELAN PM) 360 MG 24 hr capsule Take 360 mg by mouth daily.    Taking  . vitamin E 1000 UNIT capsule Take 1,000 Units by mouth daily.   Taking   Allergies  Allergen Reactions  . Penicillins     REACTION: Nausea, swelling, fever    Social History   Tobacco Use  . Smoking status: Never Smoker  . Smokeless tobacco: Never Used  Substance Use Topics  . Alcohol use: No    Family History  Problem Relation Age of Onset  . Cancer Mother        bladder  . Cancer Father        leukemia  . Cancer Maternal Aunt        breast  . Colon cancer Paternal Aunt   . Colon cancer Cousin   . Esophageal cancer Neg Hx   . Rectal cancer Neg Hx   . Stomach cancer Neg Hx      Review of Systems  Constitutional: Negative.   HENT: Negative.   Eyes: Negative.   Respiratory: Negative.   Cardiovascular: Negative.   Gastrointestinal: Negative.   Genitourinary: Negative.   Musculoskeletal: Positive for joint pain.  Skin: Negative.   Neurological: Negative.   Endo/Heme/Allergies: Negative.   Psychiatric/Behavioral: Negative.     Objective:  Physical Exam  Constitutional: She is oriented to person, place, and time. She appears well-developed.  HENT:  Head: Normocephalic.  Eyes: Pupils are equal, round, and reactive to light.  Neck: Neck supple. No JVD present. No tracheal deviation present. No thyromegaly present.  Cardiovascular: Normal rate, regular rhythm and intact distal pulses.  Respiratory: Effort normal and breath sounds normal. No respiratory distress. She has no wheezes.  GI: Soft. There is no abdominal tenderness. There is no guarding.  Musculoskeletal:      Right knee: She exhibits decreased range of motion, swelling and bony tenderness. She exhibits no ecchymosis, no deformity, no laceration and no erythema. Tenderness found.  Lymphadenopathy:    She has no cervical adenopathy.  Neurological: She is alert and oriented to person, place, and time.  Skin: Skin is warm and dry.  Psychiatric: She has a normal mood and affect.  Labs:  Estimated body mass index is 26.56 kg/m as calculated from the following:   Height as of 01/22/18: 5\' 5"  (1.651 m).   Weight as of 01/22/18: 72.4 kg.   Imaging Review Plain radiographs demonstrate severe degenerative joint disease of the right knee.  The bone quality appears to be good for age and reported activity level.      Assessment/Plan:  End stage arthritis, right knee   The patient history, physical examination, clinical judgment of the provider and imaging studies are consistent with end stage degenerative joint disease of the right knee(s) and total knee arthroplasty is deemed medically necessary. The treatment options including medical management, injection therapy arthroscopy and arthroplasty were discussed at length. The risks and benefits of total knee arthroplasty were presented and reviewed. The risks due to aseptic loosening, infection, stiffness, patella tracking problems, thromboembolic complications and other imponderables were discussed. The patient acknowledged the explanation, agreed to proceed with the plan and consent was signed. Patient is being admitted for inpatient treatment for surgery, pain control, PT, OT, prophylactic antibiotics, VTE prophylaxis, progressive ambulation and ADL's and discharge planning. The patient is planning to be discharged home.    Patient's anticipated LOS is less than 2 midnights, meeting these requirements: - Lives within 1 hour of care - Has a competent adult at home to recover with post-op recover - NO history of  - Chronic pain requiring  opiods  - Diabetes  - Coronary Artery Disease  - Heart failure  - Heart attack  - Stroke  - DVT/VTE  - Cardiac arrhythmia  - Respiratory Failure/COPD  - Renal failure  - Anemia  - Advanced Liver disease        [place] Pugh. Cecely Rengel   PA-C  06/17/2018, 3:56 PM

## 2018-06-20 NOTE — Patient Instructions (Addendum)
Yolanda Peters    Your procedure is scheduled on: 06-26-2018  Report to [facility] Main  Entrance              Report to admitting at  530 AM               [place] [facility] TEST ON__06/12/2018 @_1040  am, THIS TEST MUST BE DONE BEFORE SURGERY, COME TO [place].    Call this number if you have problems the morning of surgery [phone]    Remember: . BRUSH YOUR TEETH MORNING OF SURGERY AND RINSE YOUR MOUTH OUT, NO CHEWING GUM CANDY OR MINTS.   NO SOLID FOOD AFTER MIDNIGHT THE NIGHT PRIOR TO SURGERY. NOTHING BY MOUTH EXCEPT CLEAR LIQUIDS UNTIL 430 AM.               PLEASE FINISH ENSURE DRINK PER SURGEON ORDER  WHICH NEEDS TO BE COMPLETED AT 430 AM.    CLEAR LIQUID DIET   Foods Allowed                                                                     Foods Excluded  Coffee and tea, regular and decaf                             liquids that you cannot  Plain Jell-O in any flavor                                             see through such as: Fruit ices (not with fruit pulp)                                     milk, soups, orange juice  Iced Popsicles                                    All solid food Carbonated beverages, regular and diet                                    Cranberry, grape and apple juices Sports drinks like Gatorade Lightly seasoned clear broth or consume(fat free) Sugar, honey syrup  Sample Menu Breakfast                                Lunch                                     Supper Cranberry juice                    Beef broth  Chicken broth Jell-O                                     Grape juice                           Apple juice Coffee or tea                        Jell-O                                      Popsicle                                                Coffee or tea                        Coffee or  tea  _____________________________________________________________________    Take these medicines the morning of surgery with A SIP OF WATER: LORATADINE (CLARITIN), VERAPAMIL                                You may not have any metal on your body including hair pins and              piercings  Do not wear jewelry, make-up, lotions, powders or perfumes, deodorant             Do not wear nail polish.  Do not shave  48 hours prior to surgery.               Do not bring valuables to the hospital. [place].  Contacts, dentures or bridgework may not be worn into surgery.  Leave suitcase in the car. After surgery it may be brought to your room.               _____________________________________________________________________             [facility] - Preparing for Surgery Before surgery, you can play an important role.  Because skin is not sterile, your skin needs to be as free of germs as possible.  You can reduce the number of germs on your skin by washing with CHG (chlorahexidine gluconate) soap before surgery.  CHG is an antiseptic cleaner which kills germs and bonds with the skin to continue killing germs even after washing. Please DO NOT use if you have an allergy to CHG or antibacterial soaps.  If your skin becomes reddened/irritated stop using the CHG and inform your nurse when you arrive at Short Stay. Do not shave (including legs and underarms) for at least 48 hours prior to the first CHG shower.  You may shave your face/neck. Please follow these instructions carefully:  1.  Shower with CHG Soap the night before surgery and the  morning of Surgery.  2.  If you choose to wash your hair, wash your hair first as usual with your  normal  shampoo.  3.  After  you shampoo, rinse your hair and body thoroughly to remove the  shampoo.                           4.  Use CHG as you would any other liquid soap.  You can apply chg directly  to  the skin and wash                       Gently with a scrungie or clean washcloth.  5.  Apply the CHG Soap to your body ONLY FROM THE NECK DOWN.   Do not use on face/ open                           Wound or open sores. Avoid contact with eyes, ears mouth and genitals (private parts).                       Wash face,  Genitals (private parts) with your normal soap.             6.  Wash thoroughly, paying special attention to the area where your surgery  will be performed.  7.  Thoroughly rinse your body with warm water from the neck down.  8.  DO NOT shower/wash with your normal soap after using and rinsing off  the CHG Soap.                9.  Pat yourself dry with a clean towel.            10.  Wear clean pajamas.            11.  Place clean sheets on your bed the night of your first shower and do not  sleep with pets. Day of Surgery : Do not apply any lotions/deodorants the morning of surgery.  Please wear clean clothes to the hospital/surgery center.  FAILURE TO FOLLOW THESE INSTRUCTIONS MAY RESULT IN THE CANCELLATION OF YOUR SURGERY PATIENT SIGNATURE_________________________________  NURSE SIGNATURE__________________________________  ________________________________________________________________________   Yolanda Peters  An incentive spirometer is a tool that can help keep your lungs clear and active. This tool measures how well you are filling your lungs with each breath. Taking long deep breaths may help reverse or decrease the chance of developing breathing (pulmonary) problems (especially infection) following:  A long period of time when you are unable to move or be active. BEFORE THE PROCEDURE   If the spirometer includes an indicator to show your best effort, your nurse or respiratory therapist will set it to a desired goal.  If possible, sit up straight or lean slightly forward. Try not to slouch.  Hold the incentive spirometer in an upright position. INSTRUCTIONS  FOR USE  1. Sit on the edge of your bed if possible, or sit up as far as you can in bed or on a chair. 2. Hold the incentive spirometer in an upright position. 3. Breathe out normally. 4. Place the mouthpiece in your mouth and seal your lips tightly around it. 5. Breathe in slowly and as deeply as possible, raising the piston or the ball toward the top of the column. 6. Hold your breath for 3-5 seconds or for as long as possible. Allow the piston or ball to fall to the bottom of the column. 7. Remove the mouthpiece from your mouth and breathe out normally. 8. Rest for a  few seconds and repeat Steps 1 through 7 at least 10 times every 1-2 hours when you are awake. Take your time and take a few normal breaths between deep breaths. 9. The spirometer may include an indicator to show your best effort. Use the indicator as a goal to work toward during each repetition. 10. After each set of 10 deep breaths, practice coughing to be sure your lungs are clear. If you have an incision (the cut made at the time of surgery), support your incision when coughing by placing a pillow or rolled up towels firmly against it. Once you are able to get out of bed, walk around indoors and cough well. You may stop using the incentive spirometer when instructed by your caregiver.  RISKS AND COMPLICATIONS  Take your time so you do not get dizzy or light-headed.  If you are in pain, you may need to take or ask for pain medication before doing incentive spirometry. It is harder to take a deep breath if you are having pain. AFTER USE  Rest and breathe slowly and easily.  It can be helpful to keep track of a log of your progress. Your caregiver can provide you with a simple table to help with this. If you are using the spirometer at home, follow these instructions: [place] IF:   You are having difficultly using the spirometer.  You have trouble using the spirometer as often as instructed.  Your pain  medication is not giving enough relief while using the spirometer.  You develop fever of 100.5 F (38.1 C) or higher. SEEK IMMEDIATE MEDICAL CARE IF:   You cough up bloody sputum that had not been present before.  You develop fever of 102 F (38.9 C) or greater.  You develop worsening pain at or near the incision site. MAKE SURE YOU:   Understand these instructions.  Will watch your condition.  Will get help right away if you are not doing well or get worse. Document Released: 05/09/2006 Document Revised: 03/21/2011 Document Reviewed: 07/10/2006 ExitCare Patient Information 2014 ExitCare, [place].   ________________________________________________________________________  WHAT IS A BLOOD TRANSFUSION? Blood Transfusion Information  A transfusion is the replacement of blood or some of its parts. Blood is made up of multiple cells which provide different functions.  Red blood cells carry oxygen and are used for blood loss replacement.  White blood cells fight against infection.  Platelets control bleeding.  Plasma helps clot blood.  Other blood products are available for specialized needs, such as hemophilia or other clotting disorders. BEFORE THE TRANSFUSION  Who gives blood for transfusions?   Healthy volunteers who are fully evaluated to make sure their blood is safe. This is blood bank blood. Transfusion therapy is the safest it has ever been in the practice of medicine. Before blood is taken from a donor, a complete history is taken to make sure that person has no history of diseases nor engages in risky social behavior (examples are intravenous drug use or sexual activity with multiple partners). The donor's travel history is screened to minimize risk of transmitting infections, such as malaria. The donated blood is tested for signs of infectious diseases, such as HIV and hepatitis. The blood is then tested to be sure it is compatible with you in order to minimize the  chance of a transfusion reaction. If you or a relative donates blood, this is often done in anticipation of surgery and is not appropriate for emergency situations. It takes many days to  process the donated blood. RISKS AND COMPLICATIONS Although transfusion therapy is very safe and saves many lives, the main dangers of transfusion include:   Getting an infectious disease.  Developing a transfusion reaction. This is an allergic reaction to something in the blood you were given. Every precaution is taken to prevent this. The decision to have a blood transfusion has been considered carefully by your caregiver before blood is given. Blood is not given unless the benefits outweigh the risks. AFTER THE TRANSFUSION  Right after receiving a blood transfusion, you will usually feel much better and more energetic. This is especially true if your red blood cells have gotten low (anemic). The transfusion raises the level of the red blood cells which carry oxygen, and this usually causes an energy increase.  The nurse administering the transfusion will monitor you carefully for complications. HOME CARE INSTRUCTIONS  No special instructions are needed after a transfusion. You may find your energy is better. Speak with your caregiver about any limitations on activity for underlying diseases you may have. SEEK MEDICAL CARE IF:   Your condition is not improving after your transfusion.  You develop redness or irritation at the intravenous (IV) site. SEEK IMMEDIATE MEDICAL CARE IF:  Any of the following symptoms occur over the next 12 hours:  Shaking chills.  You have a temperature by mouth above 102 F (38.9 C), not controlled by medicine.  Chest, back, or muscle pain.  People around you feel you are not acting correctly or are confused.  Shortness of breath or difficulty breathing.  Dizziness and fainting.  You get a rash or develop hives.  You have a decrease in urine output.  Your urine  turns a dark color or changes to pink, red, or brown. Any of the following symptoms occur over the next 10 days:  You have a temperature by mouth above 102 F (38.9 C), not controlled by medicine.  Shortness of breath.  Weakness after normal activity.  The white part of the eye turns yellow (jaundice).  You have a decrease in the amount of urine or are urinating less often.  Your urine turns a dark color or changes to pink, red, or brown. Document Released: 12/25/1999 Document Revised: 03/21/2011 Document Reviewed: 08/13/2007 [facility] Patient Information 2014 [place], [place].  _______________________________________________________________________

## 2018-06-21 ENCOUNTER — Encounter (HOSPITAL_COMMUNITY)
Admission: RE | Admit: 2018-06-21 | Discharge: 2018-06-21 | Disposition: A | Discharge status: Home / Self Care | Payer: Medicare Other | Source: Ambulatory Visit | Attending: Orthopedic Surgery | Admitting: Orthopedic Surgery

## 2018-06-21 ENCOUNTER — Encounter (HOSPITAL_COMMUNITY): Payer: Self-pay

## 2018-06-21 ENCOUNTER — Other Ambulatory Visit: Payer: Self-pay

## 2018-06-21 DIAGNOSIS — Z1159 Encounter for screening for other viral diseases: Secondary | ICD-10-CM | POA: Diagnosis not present

## 2018-06-21 DIAGNOSIS — Z79899 Other long term (current) drug therapy: Secondary | ICD-10-CM | POA: Insufficient documentation

## 2018-06-21 DIAGNOSIS — M1711 Unilateral primary osteoarthritis, right knee: Secondary | ICD-10-CM | POA: Insufficient documentation

## 2018-06-21 DIAGNOSIS — R600 Localized edema: Secondary | ICD-10-CM | POA: Diagnosis not present

## 2018-06-21 DIAGNOSIS — Z923 Personal history of irradiation: Secondary | ICD-10-CM | POA: Diagnosis not present

## 2018-06-21 DIAGNOSIS — E785 Hyperlipidemia, unspecified: Secondary | ICD-10-CM | POA: Diagnosis not present

## 2018-06-21 DIAGNOSIS — I10 Essential (primary) hypertension: Secondary | ICD-10-CM | POA: Diagnosis not present

## 2018-06-21 DIAGNOSIS — Z853 Personal history of malignant neoplasm of breast: Secondary | ICD-10-CM | POA: Insufficient documentation

## 2018-06-21 DIAGNOSIS — Z01818 Encounter for other preprocedural examination: Secondary | ICD-10-CM | POA: Diagnosis present

## 2018-06-21 LAB — BASIC METABOLIC PANEL
Anion gap: 12 (ref 5–15)
BUN: 22 mg/dL (ref 8–23)
CO2: 27 mmol/L (ref 22–32)
Calcium: 9.1 mg/dL (ref 8.9–10.3)
Chloride: 101 mmol/L (ref 98–111)
Creatinine, Ser: 0.76 mg/dL (ref 0.44–1.00)
GFR calc Af Amer: >60 mL/min (ref >60)
GFR calc non Af Amer: >60 mL/min (ref >60)
Glucose, Bld: 93 mg/dL (ref 70–99)
Potassium: 3.5 mmol/L (ref 3.5–5.1)
Sodium: 140 mmol/L (ref 135–145)

## 2018-06-21 LAB — CBC
HCT: 44.8 % (ref 36.0–46.0)
Hemoglobin: 14 g/dL (ref 12.0–15.0)
MCH: 27.7 pg (ref 26.0–34.0)
MCHC: 31.3 g/dL (ref 30.0–36.0)
MCV: 88.7 fL (ref 80.0–100.0)
Platelets: 301 10*3/uL (ref 150–400)
RBC: 5.05 MIL/uL (ref 3.87–5.11)
RDW: 14.5 % (ref 11.5–15.5)
WBC: 7.3 10*3/uL (ref 4.0–10.5)
nRBC: 0 % (ref 0.0–0.2)

## 2018-06-21 LAB — SURGICAL PCR SCREEN
MRSA, PCR: NEGATIVE
Staphylococcus aureus: NEGATIVE

## 2018-06-21 LAB — ABO/RH: ABO/RH(D): O NEG

## 2018-06-21 NOTE — Progress Notes (Signed)
Anesthesia Chart Review   Case: [id number] Date/Time: 06/26/18 0700   Procedure: TOTAL KNEE ARTHROPLASTY (Right ) - 70 mins   Anesthesia type: Spinal   Pre-op diagnosis: Right knee osteoarthritis   Location: [facility] ROOM 09 / [facility]   Surgeon: Paralee Cancel, MD      DISCUSSION: 78 yo never smoker with h/o HTN, HLD, right breast cancer s/p radiation, RA, right knee OA scheduled for above procedure 06/26/18 with Dr. Paralee Cancel.   Pt with bilateral pitting edema to lower extremities at PAT visit.  She reports experiencing this intermittently and typically improves with lasix.  She reports LE edema becomes worse when she is less active.  Pt reports she usually swims three times a week, but has not been able to do that in several weeks due to [place].  She denies shortness of breath, chest pain, palpitations, calf pain.  Lungs clears to auscultation, regular rate and rhythm, negative Homans.  Discussed with PCP, Dr. Virgina Jock, who recommends pt use compression stockings and take Lasix.  If no improvement she will contact PCP.  Surgeon made aware.   Anticipate pt can proceed with planned procedure barring acute status change.  VS: BP (!) 116/98   Pulse 91   Temp 36.7 C (Oral)   Resp 18   Ht 5\' 5"  (1.651 m)   Wt 70.7 kg   BMI 25.94 kg/m   PROVIDERS: Shon Baton, MD is PCP    LABS: Labs reviewed: Acceptable for surgery. (all labs ordered are listed, but only abnormal results are displayed)  Labs Reviewed  SURGICAL PCR SCREEN  CBC  BASIC METABOLIC PANEL  TYPE AND SCREEN  ABO/RH     IMAGES:   EKG: 06/21/2018 Rate 80 bpm Normal sinus rhythm   CV:  Past Medical History:  Diagnosis Date  . Allergy   . Arthritis   . Breast cancer [facility]) 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  . Cancer ([place])    right breast   . Hyperlipidemia    takes Niacin  . Hypertension   . Personal history of radiation therapy 2011   rt breast    Past Surgical History:  Procedure Laterality Date  . BREAST DUCTAL SYSTEM  EXCISION     right breast  . BREAST LUMPECTOMY    . BREAST SURGERY  august 2011   right partial mastectomy  . CATARACT EXTRACTION     bilateral  . CESAREAN SECTION    . ELBOW SURGERY     left  . MYOMECTOMY      MEDICATIONS: . Ascorbic Acid (VITAMIN C) 1000 MG tablet  . beta carotene 10000 UNIT capsule  . CALCIUM & MAGNESIUM CARBONATES PO  . Cholecalciferol (VITAMIN D-3 PO)  . Ginger, Zingiber officinalis, (GINGER ROOT PO)  . glucosamine-chondroitin 500-400 MG tablet  . hydrochlorothiazide (HYDRODIURIL) 25 MG tablet  . hydroxychloroquine (PLAQUENIL) 200 MG tablet  . loratadine (CLARITIN) 10 MG tablet  . Menthol, Topical Analgesic, (BIOFREEZE EX)  . Misc Natural Products (TART CHERRY ADVANCED PO)  . Probiotic Product (PROBIOTIC DAILY PO)  . Sodium Hyaluronate, [place]osup, (HYALGAN) 20 MG/2ML SOLN  . trimethoprim (TRIMPEX) 100 MG tablet  . verapamil (VERELAN PM) 360 MG 24 hr capsule  . vitamin E 1000 UNIT capsule   No current facility-administered medications for this encounter.    Maia Plan [facility] Pre-Surgical Testing [phone] 06/21/18 4:17 PM

## 2018-06-21 NOTE — Anesthesia Preprocedure Evaluation (Addendum)
Anesthesia Evaluation  Patient identified by MRN, date of birth, ID band Patient awake    Reviewed: Allergy & Precautions, H&P , NPO status , Patient's Chart, lab work & pertinent test results, reviewed documented beta blocker date and time   Airway Mallampati: II  TM Distance: >3 FB Neck ROM: full    Dental no notable dental hx.    Pulmonary neg pulmonary ROS,    Pulmonary exam normal breath sounds clear to auscultation       Cardiovascular Exercise Tolerance: Good hypertension, Pt. on medications negative cardio ROS   Rhythm:regular Rate:Normal   EKG: 06/21/2018 Rate 80 bpm Normal sinus rhythm    Neuro/Psych negative neurological ROS  negative psych ROS   GI/Hepatic negative GI ROS, Neg liver ROS,   Endo/Other  negative endocrine ROS  Renal/GU negative Renal ROS  negative genitourinary   Musculoskeletal  (+) Arthritis , Osteoarthritis,    Abdominal   Peds  Hematology negative hematology ROS (+)   Anesthesia Other Findings   Reproductive/Obstetrics negative OB ROS                            Anesthesia Physical Anesthesia Plan  ASA: II  Anesthesia Plan: Spinal   Post-op Pain Management:  Regional for Post-op pain   Induction:   PONV Risk Score and Plan: 2  Airway Management Planned: Nasal Cannula and Simple Face Mask  Additional Equipment:   Intra-op Plan:   Post-operative Plan:   Informed Consent: I have reviewed the patients History and Physical, chart, labs and discussed the procedure including the risks, benefits and alternatives for the proposed anesthesia with the patient or authorized representative who has indicated his/her understanding and acceptance.     Dental Advisory Given  Plan Discussed with: CRNA, Anesthesiologist and Surgeon  Anesthesia Plan Comments: (See PAT note 06/21/18, Konrad Felix, PA-C)       Anesthesia Quick Evaluation

## 2018-06-22 ENCOUNTER — Other Ambulatory Visit (HOSPITAL_COMMUNITY)
Admission: RE | Admit: 2018-06-22 | Discharge: 2018-06-22 | Disposition: A | Discharge status: Home / Self Care | Payer: Medicare Other | Source: Ambulatory Visit | Attending: Orthopedic Surgery | Admitting: Orthopedic Surgery

## 2018-06-22 DIAGNOSIS — Z01818 Encounter for other preprocedural examination: Secondary | ICD-10-CM | POA: Diagnosis not present

## 2018-06-23 LAB — NOVEL CORONAVIRUS, NAA (HOSP ORDER, SEND-OUT TO REF LAB; TAT 18-24 HRS): SARS-CoV-2, NAA: NOT DETECTED

## 2018-06-25 ENCOUNTER — Ambulatory Visit: Payer: Self-pay | Admitting: Rheumatology

## 2018-06-25 NOTE — Progress Notes (Signed)
Ms. Mayor made aware to arrive 06/26/2018 at 7:40AM she verbalized understanding.

## 2018-06-25 NOTE — Progress Notes (Signed)
SPOKE W/  _[name] Confirmed surg.time also     SCREENING SYMPTOMS OF COVID 19:  No new symptoms, NO to ALL  COUGH--  RUNNY NOSE---   SORE THROAT---  NASAL CONGESTION----  SNEEZING----  SHORTNESS OF BREATH---  DIFFICULTY BREATHING---  TEMP >100.0 -----  UNEXPLAINED BODY ACHES------  CHILLS --------   HEADACHES ---------  LOSS OF SMELL/ TASTE --------    HAVE YOU OR ANY FAMILY MEMBER TRAVELLED PAST 14 DAYS OUT OF THE   COUNTY---no STATE----no COUNTRY----no  HAVE YOU OR ANY FAMILY MEMBER BEEN EXPOSED TO ANYONE WITH COVID 19? no

## 2018-06-26 ENCOUNTER — Other Ambulatory Visit: Payer: Self-pay

## 2018-06-26 ENCOUNTER — Encounter (HOSPITAL_COMMUNITY): Payer: Self-pay | Admitting: *Deleted

## 2018-06-26 ENCOUNTER — Observation Stay (HOSPITAL_COMMUNITY)
Admission: RE | Admit: 2018-06-26 | Discharge: 2018-06-27 | Disposition: A | Discharge status: Home / Self Care | Payer: Medicare Other | Attending: Orthopedic Surgery | Admitting: Orthopedic Surgery

## 2018-06-26 ENCOUNTER — Ambulatory Visit (HOSPITAL_COMMUNITY): Payer: Medicare Other | Admitting: Anesthesiology

## 2018-06-26 ENCOUNTER — Ambulatory Visit (HOSPITAL_COMMUNITY): Payer: Medicare Other | Admitting: Physician Assistant

## 2018-06-26 ENCOUNTER — Encounter (HOSPITAL_COMMUNITY)
Admission: RE | Disposition: A | Discharge status: Home / Self Care | Payer: Self-pay | Source: Home / Self Care | Attending: Orthopedic Surgery

## 2018-06-26 DIAGNOSIS — Z880 Allergy status to penicillin: Secondary | ICD-10-CM | POA: Insufficient documentation

## 2018-06-26 DIAGNOSIS — Z79899 Other long term (current) drug therapy: Secondary | ICD-10-CM | POA: Diagnosis not present

## 2018-06-26 DIAGNOSIS — Z853 Personal history of malignant neoplasm of breast: Secondary | ICD-10-CM | POA: Diagnosis not present

## 2018-06-26 DIAGNOSIS — E663 Overweight: Secondary | ICD-10-CM | POA: Diagnosis present

## 2018-06-26 DIAGNOSIS — I10 Essential (primary) hypertension: Secondary | ICD-10-CM | POA: Diagnosis not present

## 2018-06-26 DIAGNOSIS — E785 Hyperlipidemia, unspecified: Secondary | ICD-10-CM | POA: Diagnosis not present

## 2018-06-26 DIAGNOSIS — Z96659 Presence of unspecified artificial knee joint: Secondary | ICD-10-CM

## 2018-06-26 DIAGNOSIS — M1711 Unilateral primary osteoarthritis, right knee: Secondary | ICD-10-CM | POA: Diagnosis present

## 2018-06-26 DIAGNOSIS — E876 Hypokalemia: Secondary | ICD-10-CM

## 2018-06-26 DIAGNOSIS — Z96651 Presence of right artificial knee joint: Secondary | ICD-10-CM

## 2018-06-26 HISTORY — PX: TOTAL KNEE ARTHROPLASTY: SHX125

## 2018-06-26 LAB — TYPE AND SCREEN
ABO/RH(D): O NEG
Antibody Screen: NEGATIVE

## 2018-06-26 SURGERY — ARTHROPLASTY, KNEE, TOTAL
Anesthesia: Spinal | Site: Knee | Laterality: Right

## 2018-06-26 MED ORDER — ONDANSETRON HCL 4 MG/2ML IJ SOLN: 4.0000 mg | Freq: Four times a day (QID) | INTRAMUSCULAR | Status: DC | PRN

## 2018-06-26 MED ORDER — HYDROCODONE-ACETAMINOPHEN 7.5-325 MG PO TABS: 1.0000 | ORAL_TABLET | ORAL | Status: DC | PRN

## 2018-06-26 MED ORDER — POLYETHYLENE GLYCOL 3350 17 G PO PACK
17.0000 g | PACK | Freq: Two times a day (BID) | ORAL | Status: DC
  Administered 2018-06-26 – 2018-06-27 (×2): 17 g via ORAL
  Filled 2018-06-26 (×2): qty 1

## 2018-06-26 MED ORDER — ROPIVACAINE HCL 7.5 MG/ML IJ SOLN
INTRAMUSCULAR | Status: DC | PRN
  Administered 2018-06-26: 30 mL via PERINEURAL

## 2018-06-26 MED ORDER — PROPOFOL 10 MG/ML IV BOLUS
INTRAVENOUS | Status: DC | PRN
  Administered 2018-06-26 (×2): 10 mg via INTRAVENOUS

## 2018-06-26 MED ORDER — DEXAMETHASONE SODIUM PHOSPHATE 10 MG/ML IJ SOLN
10.0000 mg | Freq: Once | INTRAMUSCULAR | Status: AC
  Administered 2018-06-26: 10 mg via INTRAVENOUS

## 2018-06-26 MED ORDER — METHOCARBAMOL 500 MG IVPB - SIMPLE MED
500.0000 mg | Freq: Four times a day (QID) | INTRAVENOUS | Status: DC | PRN
  Filled 2018-06-26: qty 50

## 2018-06-26 MED ORDER — ONDANSETRON HCL 4 MG/2ML IJ SOLN
INTRAMUSCULAR | Status: DC | PRN
  Administered 2018-06-26: 4 mg via INTRAVENOUS

## 2018-06-26 MED ORDER — SODIUM CHLORIDE (PF) 0.9 % IJ SOLN
INTRAMUSCULAR | Status: DC | PRN
  Administered 2018-06-26: 30 mL via INTRAVENOUS

## 2018-06-26 MED ORDER — DEXAMETHASONE SODIUM PHOSPHATE 10 MG/ML IJ SOLN
10.0000 mg | Freq: Once | INTRAMUSCULAR | Status: AC
  Administered 2018-06-27: 10 mg via INTRAVENOUS
  Filled 2018-06-26: qty 1

## 2018-06-26 MED ORDER — SODIUM CHLORIDE 0.9 % IR SOLN
Status: DC | PRN
  Administered 2018-06-26: 1000 mL

## 2018-06-26 MED ORDER — HYDROCHLOROTHIAZIDE 25 MG PO TABS: 25.0000 mg | ORAL_TABLET | Freq: Every day | ORAL | Status: DC | PRN

## 2018-06-26 MED ORDER — TRANEXAMIC ACID-NACL 1000-0.7 MG/100ML-% IV SOLN
1000.0000 mg | Freq: Once | INTRAVENOUS | Status: AC
  Administered 2018-06-26: 1000 mg via INTRAVENOUS
  Filled 2018-06-26: qty 100

## 2018-06-26 MED ORDER — BISACODYL 10 MG RE SUPP: 10.0000 mg | Freq: Every day | RECTAL | Status: DC | PRN

## 2018-06-26 MED ORDER — HYDROCODONE-ACETAMINOPHEN 5-325 MG PO TABS
1.0000 | ORAL_TABLET | ORAL | Status: DC | PRN
  Administered 2018-06-26 – 2018-06-27 (×2): 1 via ORAL
  Filled 2018-06-26 (×3): qty 1

## 2018-06-26 MED ORDER — FENTANYL CITRATE (PF) 100 MCG/2ML IJ SOLN
100.0000 ug | INTRAMUSCULAR | Status: DC
  Administered 2018-06-26: 100 ug via INTRAVENOUS
  Filled 2018-06-26: qty 2

## 2018-06-26 MED ORDER — MENTHOL 3 MG MT LOZG: 1.0000 | LOZENGE | OROMUCOSAL | Status: DC | PRN

## 2018-06-26 MED ORDER — STERILE WATER FOR IRRIGATION IR SOLN
Status: DC | PRN
  Administered 2018-06-26 (×2): 1000 mL

## 2018-06-26 MED ORDER — SODIUM CHLORIDE 0.9 % IV SOLN
INTRAVENOUS | Status: DC
  Administered 2018-06-26 – 2018-06-27 (×2): via INTRAVENOUS

## 2018-06-26 MED ORDER — BUPIVACAINE IN DEXTROSE 0.75-8.25 % IT SOLN
INTRATHECAL | Status: DC | PRN
  Administered 2018-06-26: 1.6 mL via INTRATHECAL

## 2018-06-26 MED ORDER — KETOROLAC TROMETHAMINE 30 MG/ML IJ SOLN
INTRAMUSCULAR | Status: AC
  Filled 2018-06-26: qty 1

## 2018-06-26 MED ORDER — PROPOFOL 500 MG/50ML IV EMUL
INTRAVENOUS | Status: DC | PRN
  Administered 2018-06-26: 75 ug/kg/min via INTRAVENOUS

## 2018-06-26 MED ORDER — DOCUSATE SODIUM 100 MG PO CAPS
100.0000 mg | ORAL_CAPSULE | Freq: Two times a day (BID) | ORAL | Status: DC
  Administered 2018-06-26 – 2018-06-27 (×2): 100 mg via ORAL
  Filled 2018-06-26 (×2): qty 1

## 2018-06-26 MED ORDER — METOCLOPRAMIDE HCL 5 MG PO TABS: 5.0000 mg | ORAL_TABLET | Freq: Three times a day (TID) | ORAL | Status: DC | PRN

## 2018-06-26 MED ORDER — ACETAMINOPHEN 325 MG PO TABS: 325.0000 mg | ORAL_TABLET | ORAL | Status: DC | PRN

## 2018-06-26 MED ORDER — PROPOFOL 10 MG/ML IV BOLUS
INTRAVENOUS | Status: AC
  Filled 2018-06-26: qty 40

## 2018-06-26 MED ORDER — PROPOFOL 10 MG/ML IV BOLUS
INTRAVENOUS | Status: AC
  Filled 2018-06-26: qty 20

## 2018-06-26 MED ORDER — CELECOXIB 200 MG PO CAPS
200.0000 mg | ORAL_CAPSULE | Freq: Two times a day (BID) | ORAL | Status: DC
  Administered 2018-06-26: 200 mg via ORAL
  Filled 2018-06-26 (×3): qty 1

## 2018-06-26 MED ORDER — PHENOL 1.4 % MT LIQD: 1.0000 | OROMUCOSAL | Status: DC | PRN

## 2018-06-26 MED ORDER — EPHEDRINE 5 MG/ML INJ
INTRAVENOUS | Status: AC
  Filled 2018-06-26: qty 10

## 2018-06-26 MED ORDER — ALUM & MAG HYDROXIDE-SIMETH 200-200-20 MG/5ML PO SUSP: 15.0000 mL | ORAL | Status: DC | PRN

## 2018-06-26 MED ORDER — EPHEDRINE SULFATE-NACL 50-0.9 MG/10ML-% IV SOSY
PREFILLED_SYRINGE | INTRAVENOUS | Status: DC | PRN
  Administered 2018-06-26: 5 mg via INTRAVENOUS
  Administered 2018-06-26: 10 mg via INTRAVENOUS
  Administered 2018-06-26 (×2): 5 mg via INTRAVENOUS

## 2018-06-26 MED ORDER — METHOCARBAMOL 500 MG PO TABS: 500.0000 mg | ORAL_TABLET | Freq: Four times a day (QID) | ORAL | Status: DC | PRN

## 2018-06-26 MED ORDER — BUPIVACAINE-EPINEPHRINE (PF) 0.25% -1:200000 IJ SOLN
INTRAMUSCULAR | Status: DC | PRN
  Administered 2018-06-26: 30 mL via PERINEURAL

## 2018-06-26 MED ORDER — DEXAMETHASONE SODIUM PHOSPHATE 10 MG/ML IJ SOLN
INTRAMUSCULAR | Status: AC
  Filled 2018-06-26: qty 1

## 2018-06-26 MED ORDER — LORATADINE 10 MG PO TABS
10.0000 mg | ORAL_TABLET | Freq: Every day | ORAL | Status: DC
  Administered 2018-06-27: 10 mg via ORAL
  Filled 2018-06-26 (×2): qty 1

## 2018-06-26 MED ORDER — FENTANYL CITRATE (PF) 100 MCG/2ML IJ SOLN: 25.0000 ug | INTRAMUSCULAR | Status: DC | PRN

## 2018-06-26 MED ORDER — CEFAZOLIN SODIUM-DEXTROSE 2-4 GM/100ML-% IV SOLN
2.0000 g | Freq: Four times a day (QID) | INTRAVENOUS | Status: AC
  Administered 2018-06-26 (×2): 2 g via INTRAVENOUS
  Filled 2018-06-26 (×2): qty 100

## 2018-06-26 MED ORDER — ONDANSETRON HCL 4 MG/2ML IJ SOLN
INTRAMUSCULAR | Status: AC
  Filled 2018-06-26: qty 2

## 2018-06-26 MED ORDER — MEPERIDINE HCL 50 MG/ML IJ SOLN: 6.2500 mg | INTRAMUSCULAR | Status: DC | PRN

## 2018-06-26 MED ORDER — SODIUM CHLORIDE 0.9 % IV SOLN
INTRAVENOUS | Status: DC | PRN
  Administered 2018-06-26: 25 ug/min via INTRAVENOUS

## 2018-06-26 MED ORDER — MIDAZOLAM HCL 2 MG/2ML IJ SOLN
2.0000 mg | INTRAMUSCULAR | Status: DC
  Administered 2018-06-26: 1 mg via INTRAVENOUS
  Filled 2018-06-26: qty 2

## 2018-06-26 MED ORDER — TRANEXAMIC ACID-NACL 1000-0.7 MG/100ML-% IV SOLN
1000.0000 mg | INTRAVENOUS | Status: AC
  Administered 2018-06-26: 1000 mg via INTRAVENOUS
  Filled 2018-06-26: qty 100

## 2018-06-26 MED ORDER — ONDANSETRON HCL 4 MG PO TABS: 4.0000 mg | ORAL_TABLET | Freq: Four times a day (QID) | ORAL | Status: DC | PRN

## 2018-06-26 MED ORDER — FERROUS SULFATE 325 (65 FE) MG PO TABS
325.0000 mg | ORAL_TABLET | Freq: Two times a day (BID) | ORAL | Status: DC
  Administered 2018-06-27: 325 mg via ORAL
  Filled 2018-06-26: qty 1

## 2018-06-26 MED ORDER — OXYCODONE HCL 5 MG PO TABS: 5.0000 mg | ORAL_TABLET | Freq: Once | ORAL | Status: DC | PRN

## 2018-06-26 MED ORDER — SODIUM CHLORIDE (PF) 0.9 % IJ SOLN
INTRAMUSCULAR | Status: AC
  Filled 2018-06-26: qty 50

## 2018-06-26 MED ORDER — LACTATED RINGERS IV SOLN
INTRAVENOUS | Status: DC
  Administered 2018-06-26 (×2): via INTRAVENOUS

## 2018-06-26 MED ORDER — MORPHINE SULFATE (PF) 2 MG/ML IV SOLN: 0.5000 mg | INTRAVENOUS | Status: DC | PRN

## 2018-06-26 MED ORDER — ACETAMINOPHEN 160 MG/5ML PO SOLN: 325.0000 mg | ORAL | Status: DC | PRN

## 2018-06-26 MED ORDER — ASPIRIN 81 MG PO CHEW
81.0000 mg | CHEWABLE_TABLET | Freq: Two times a day (BID) | ORAL | Status: DC
  Administered 2018-06-26 – 2018-06-27 (×2): 81 mg via ORAL
  Filled 2018-06-26 (×2): qty 1

## 2018-06-26 MED ORDER — KETOROLAC TROMETHAMINE 30 MG/ML IJ SOLN
INTRAMUSCULAR | Status: DC | PRN
  Administered 2018-06-26: 30 mg via INTRAVENOUS

## 2018-06-26 MED ORDER — VERAPAMIL HCL ER 180 MG PO TBCR
360.0000 mg | EXTENDED_RELEASE_TABLET | Freq: Every day | ORAL | Status: DC
  Administered 2018-06-27: 360 mg via ORAL
  Filled 2018-06-26: qty 2

## 2018-06-26 MED ORDER — CHLORHEXIDINE GLUCONATE 4 % EX LIQD: 60.0000 mL | Freq: Once | CUTANEOUS | Status: DC

## 2018-06-26 MED ORDER — METOCLOPRAMIDE HCL 5 MG/ML IJ SOLN: 5.0000 mg | Freq: Three times a day (TID) | INTRAMUSCULAR | Status: DC | PRN

## 2018-06-26 MED ORDER — POVIDONE-IODINE 10 % EX SWAB: 2.0000 "application " | Freq: Once | CUTANEOUS | Status: DC

## 2018-06-26 MED ORDER — MAGNESIUM CITRATE PO SOLN: 1.0000 | Freq: Once | ORAL | Status: DC | PRN

## 2018-06-26 MED ORDER — BUPIVACAINE-EPINEPHRINE (PF) 0.25% -1:200000 IJ SOLN
INTRAMUSCULAR | Status: AC
  Filled 2018-06-26: qty 30

## 2018-06-26 MED ORDER — DIPHENHYDRAMINE HCL 12.5 MG/5ML PO ELIX: 12.5000 mg | ORAL_SOLUTION | ORAL | Status: DC | PRN

## 2018-06-26 MED ORDER — ACETAMINOPHEN 325 MG PO TABS: 325.0000 mg | ORAL_TABLET | Freq: Four times a day (QID) | ORAL | Status: DC | PRN

## 2018-06-26 MED ORDER — ONDANSETRON HCL 4 MG/2ML IJ SOLN: 4.0000 mg | Freq: Once | INTRAMUSCULAR | Status: DC | PRN

## 2018-06-26 MED ORDER — CEFAZOLIN SODIUM-DEXTROSE 2-4 GM/100ML-% IV SOLN
2.0000 g | INTRAVENOUS | Status: AC
  Administered 2018-06-26: 2 g via INTRAVENOUS
  Filled 2018-06-26: qty 100

## 2018-06-26 MED ORDER — CLONIDINE HCL (ANALGESIA) 100 MCG/ML EP SOLN
EPIDURAL | Status: DC | PRN
  Administered 2018-06-26: 100 ug

## 2018-06-26 MED ORDER — OXYCODONE HCL 5 MG/5ML PO SOLN: 5.0000 mg | Freq: Once | ORAL | Status: DC | PRN

## 2018-06-26 SURGICAL SUPPLY — 59 items
ATTUNE MED ANAT PAT 35 KNEE (Knees) ×1 IMPLANT
ATTUNE PSFEM RTSZ5 NARCEM KNEE (Femur) ×1 IMPLANT
ATTUNE PSRP INSR SZ5 6 KNEE (Insert) ×1 IMPLANT
BAG ZIPLOCK 12X15 (MISCELLANEOUS) IMPLANT
BANDAGE ACE 6X5 VEL STRL LF (GAUZE/BANDAGES/DRESSINGS) ×2 IMPLANT
BANDAGE ELASTIC 6 VELCRO ST LF (GAUZE/BANDAGES/DRESSINGS) ×1 IMPLANT
BASEPLATE TIBIAL ROTATING SZ 4 (Knees) ×1 IMPLANT
BLADE SAW SGTL 11.0X1.19X90.0M (BLADE) IMPLANT
BLADE SAW SGTL 13.0X1.19X90.0M (BLADE) ×2 IMPLANT
BLADE SURG SZ10 CARB STEEL (BLADE) ×4 IMPLANT
BOWL SMART MIX CTS (DISPOSABLE) ×2 IMPLANT
CEMENT HV SMART SET (Cement) ×2 IMPLANT
COVER SURGICAL LIGHT HANDLE (MISCELLANEOUS) ×2 IMPLANT
COVER WAND RF STERILE (DRAPES) IMPLANT
CUFF TOURN SGL QUICK 34 (TOURNIQUET CUFF) ×2
CUFF TRNQT CYL 34X4.125X (TOURNIQUET CUFF) ×1 IMPLANT
DECANTER SPIKE VIAL GLASS SM (MISCELLANEOUS) ×4 IMPLANT
DERMABOND ADVANCED (GAUZE/BANDAGES/DRESSINGS) ×1
DERMABOND ADVANCED .7 DNX12 (GAUZE/BANDAGES/DRESSINGS) ×1 IMPLANT
DRAPE U-SHAPE 47X51 STRL (DRAPES) ×2 IMPLANT
DRESSING AQUACEL AG SP 3.5X10 (GAUZE/BANDAGES/DRESSINGS) ×1 IMPLANT
DRSG AQUACEL AG SP 3.5X10 (GAUZE/BANDAGES/DRESSINGS) ×2
DURAPREP 26ML APPLICATOR (WOUND CARE) ×4 IMPLANT
ELECT REM PT RETURN 15FT ADLT (MISCELLANEOUS) ×2 IMPLANT
GLOVE BIO SURGEON STRL SZ 6 (GLOVE) ×2 IMPLANT
GLOVE BIOGEL PI IND STRL 6.5 (GLOVE) ×1 IMPLANT
GLOVE BIOGEL PI IND STRL 7.5 (GLOVE) ×1 IMPLANT
GLOVE BIOGEL PI IND STRL 8.5 (GLOVE) ×1 IMPLANT
GLOVE BIOGEL PI INDICATOR 6.5 (GLOVE) ×1
GLOVE BIOGEL PI INDICATOR 7.5 (GLOVE) ×1
GLOVE BIOGEL PI INDICATOR 8.5 (GLOVE) ×1
GLOVE ECLIPSE 8.0 STRL XLNG CF (GLOVE) ×2 IMPLANT
GLOVE ORTHO TXT STRL SZ7.5 (GLOVE) ×2 IMPLANT
GOWN STRL REUS W/ TWL LRG LVL3 (GOWN DISPOSABLE) ×1 IMPLANT
GOWN STRL REUS W/TWL 2XL LVL3 (GOWN DISPOSABLE) ×2 IMPLANT
GOWN STRL REUS W/TWL LRG LVL3 (GOWN DISPOSABLE) ×4 IMPLANT
HANDPIECE INTERPULSE COAX TIP (DISPOSABLE) ×2
HOLDER FOLEY CATH W/STRAP (MISCELLANEOUS) IMPLANT
KIT TURNOVER KIT A (KITS) IMPLANT
MANIFOLD NEPTUNE II (INSTRUMENTS) ×2 IMPLANT
NDL SAFETY ECLIPSE 18X1.5 (NEEDLE) IMPLANT
NEEDLE HYPO 18GX1.5 SHARP (NEEDLE)
NS IRRIG 1000ML POUR BTL (IV SOLUTION) ×2 IMPLANT
PACK TOTAL KNEE CUSTOM (KITS) ×2 IMPLANT
PIN FIX SIGMA HP QUICK REL (PIN) ×1 IMPLANT
PIN THREADED HEADED SIGMA (PIN) ×1 IMPLANT
PROTECTOR NERVE ULNAR (MISCELLANEOUS) ×2 IMPLANT
SET HNDPC FAN SPRY TIP SCT (DISPOSABLE) ×1 IMPLANT
SET PAD KNEE POSITIONER (MISCELLANEOUS) ×2 IMPLANT
SUT MNCRL AB 4-0 PS2 18 (SUTURE) ×2 IMPLANT
SUT STRATAFIX PDS+ 0 24IN (SUTURE) ×2 IMPLANT
SUT VIC AB 1 CT1 36 (SUTURE) ×2 IMPLANT
SUT VIC AB 2-0 CT1 27 (SUTURE) ×6
SUT VIC AB 2-0 CT1 TAPERPNT 27 (SUTURE) ×3 IMPLANT
SYR 3ML LL SCALE MARK (SYRINGE) ×2 IMPLANT
TRAY FOLEY MTR SLVR 16FR STAT (SET/KITS/TRAYS/PACK) ×2 IMPLANT
WATER STERILE IRR 1000ML POUR (IV SOLUTION) ×4 IMPLANT
WRAP KNEE MAXI GEL POST OP (GAUZE/BANDAGES/DRESSINGS) ×2 IMPLANT
YANKAUER SUCT BULB TIP 10FT TU (MISCELLANEOUS) ×2 IMPLANT

## 2018-06-26 NOTE — Anesthesia Procedure Notes (Addendum)
Anesthesia Regional Block: Adductor canal block   Pre-Anesthetic Checklist: ,, timeout performed, Correct Patient, Correct Site, Correct Laterality, Correct Procedure, Correct Position, site marked, Risks and benefits discussed,  Surgical consent,  Pre-op evaluation,  At surgeon's request and post-op pain management  Laterality: Right  Prep: chloraprep       Needles:  Injection technique: Single-shot  Needle Type: Echogenic Stimulator Needle     Needle Length: 5cm  Needle Gauge: 22     Additional Needles:   Procedures:, nerve stimulator,,, ultrasound used (permanent image in chart),,,,   Nerve Stimulator or Paresthesia:  Response: quadraceps contraction, 0.45 mA,   Additional Responses:   Narrative:  Start time: 06/26/2018 9:05 AM End time: 06/26/2018 9:10 AM Injection made incrementally with aspirations every 5 mL.  Performed by: Personally  Anesthesiologist: Janeece Riggers, MD  Additional Notes: Functioning IV was confirmed and monitors were applied.  A 2mm 22ga Arrow echogenic stimulator needle was used. Sterile prep and drape,hand hygiene and sterile gloves were used. Ultrasound guidance: relevant anatomy identified, needle position confirmed, local anesthetic spread visualized around nerve(s)., vascular puncture avoided.  Image printed for medical record. Negative aspiration and negative test dose prior to incremental administration of local anesthetic. The patient tolerated the procedure well.

## 2018-06-26 NOTE — Evaluation (Signed)
Physical Therapy Evaluation Patient Details Name: Yolanda Peters MRN: [id number] DOB: November 12, 1940 Today's Date: 06/26/2018   History of Present Illness  78 yo female s/p R TKR on 06/26/18. PMH includes hip pain, HTN, HLD, R breast cancer with partial mastectomy.  Clinical Impression  Pt presents with R knee pain, decreased R knee ROM, increased time and effort to perform mobility tasks, and decreased activity tolerance. Pt to benefit from acute PT to address deficits. Pt ambulated hallway distance with RW with min guard assist, limited in distance by pain. Pt educated on ankle pumps (20/hour) to perform this afternoon/evening to increase circulation, to pt's tolerance and limited by pain. Pt plans to d/c home tomorrow to husband, husband will not be able to help her per pt report but her daughter will assist PRN. PT to progress mobility as tolerated, and will continue to follow acutely.        Follow Up Recommendations Follow surgeon's recommendation for DC plan and follow-up therapies;Supervision for mobility/OOB(OPPT)    Equipment Recommendations  None recommended by PT    Recommendations for Other Services       Precautions / Restrictions Precautions Precautions: Fall Restrictions Weight Bearing Restrictions: Yes RLE Weight Bearing: Weight bearing as tolerated      Mobility  Bed Mobility Overal bed mobility: Needs Assistance Bed Mobility: Supine to Sit     Supine to sit: Min guard;HOB elevated     General bed mobility comments: Min guard for safety, verbal cuing for sequencing, increased time and effort.  Transfers Overall transfer level: Needs assistance Equipment used: Rolling walker (2 wheeled) Transfers: Sit to/from Stand Sit to Stand: Min guard;From elevated surface         General transfer comment: Min guard for safety. Verbal cuing for hand placement when rising.  Ambulation/Gait Ambulation/Gait assistance: Min guard;+2 safety/equipment(chair follow) Gait  Distance (Feet): 60 Feet Assistive device: Rolling walker (2 wheeled) Gait Pattern/deviations: Step-to pattern;Step-through pattern;Decreased stride length;Antalgic Gait velocity: decr   General Gait Details: Min guard for safety. Verbal cuing for sequencing although pt quickly progressing to step-through gait, placement in RW, and turning. Pt with increasingly antalgic gait with further ambulation.  Stairs            Wheelchair Mobility    Modified Rankin (Stroke Patients Only)       Balance Overall balance assessment: Mild deficits observed, not formally tested                                           Pertinent Vitals/Pain Pain Assessment: 0-10 Pain Score: 2  Pain Location: R knee Pain Descriptors / Indicators: Aching Pain Intervention(s): Repositioned;Premedicated before session;Limited activity within patient's tolerance;Monitored during session;Ice applied    Home Living Family/patient expects to be discharged to:: Private residence Living Arrangements: Spouse/significant other Available Help at Discharge: Family;Available PRN/intermittently Type of Home: House Home Access: Stairs to enter Entrance Stairs-Rails: Right Entrance Stairs-Number of Steps: 1+1 Home Layout: One level Home Equipment: Walker - 2 wheels;Cane - single point      Prior Function Level of Independence: Independent with assistive device(s)         Comments: using cane PRN PTA     Hand Dominance   Dominant Hand: Right    Extremity/Trunk Assessment   Upper Extremity Assessment Upper Extremity Assessment: Overall WFL for tasks assessed    Lower Extremity Assessment Lower Extremity Assessment: Overall  WFL for tasks assessed;RLE deficits/detail RLE Deficits / Details: suspected post-surgical weakness; able to perform ankle pumps, quad set, heel slide to 50*, and SLR without lift assist RLE Sensation: WNL    Cervical / Trunk Assessment Cervical / Trunk  Assessment: Normal  Communication   Communication: No difficulties  Cognition Arousal/Alertness: Awake/alert Behavior During Therapy: WFL for tasks assessed/performed Overall Cognitive Status: Within Functional Limits for tasks assessed                                        General Comments      Exercises     Assessment/Plan    PT Assessment Patient needs continued PT services  PT Problem List Decreased strength;Decreased mobility;Decreased range of motion;Decreased activity tolerance;Decreased balance;Decreased knowledge of use of DME;Pain       PT Treatment Interventions DME instruction;Therapeutic activities;Gait training;Patient/family education;Therapeutic exercise;Stair training;Balance training;Functional mobility training    PT Goals (Current goals can be found in the Care Plan section)  Acute Rehab PT Goals Patient Stated Goal: walk normally PT Goal Formulation: With patient Time For Goal Achievement: 07/03/18 Potential to Achieve Goals: Good    Frequency 7X/week   Barriers to discharge        Co-evaluation               AM-PAC PT "6 Clicks" Mobility  Outcome Measure Help needed turning from your back to your side while in a flat bed without using bedrails?: A Little Help needed moving from lying on your back to sitting on the side of a flat bed without using bedrails?: A Little Help needed moving to and from a bed to a chair (including a wheelchair)?: A Little Help needed standing up from a chair using your arms (e.g., wheelchair or bedside chair)?: A Little Help needed to walk in hospital room?: A Little Help needed climbing 3-5 steps with a railing? : A Little 6 Click Score: 18    End of Session Equipment Utilized During Treatment: Gait belt Activity Tolerance: Patient tolerated treatment well Patient left: in chair;with call bell/phone within reach;with chair alarm set;with SCD's reapplied Nurse Communication: Mobility  status PT Visit Diagnosis: Other abnormalities of gait and mobility (R26.89);Difficulty in walking, not elsewhere classified (R26.2)    Time: 8115-7262 PT Time Calculation (min) (ACUTE ONLY): 17 min   Charges:   PT Evaluation $PT Eval Low Complexity: 1 Low        Yolanda Peters, PT Acute Rehabilitation Services Pager [phone]  Office [phone]   Yolanda Peters 06/26/2018, 5:00 PM

## 2018-06-26 NOTE — Anesthesia Procedure Notes (Signed)
Spinal  Patient location during procedure: OR Start time: 06/26/2018 9:38 AM End time: 06/26/2018 9:43 AM Staffing Anesthesiologist: Janeece Riggers, MD Preanesthetic Checklist Completed: patient identified, site marked, surgical consent, pre-op evaluation, timeout performed, IV checked, risks and benefits discussed and monitors and equipment checked Spinal Block Patient position: sitting Prep: DuraPrep Patient monitoring: heart rate, cardiac monitor, continuous pulse ox and blood pressure Approach: midline Location: L3-4 Injection technique: single-shot Needle Needle type: Sprotte  Needle gauge: 22 G Needle length: 9 cm Assessment Sensory level: T4 Additional Notes Multiple attempts @ L2/3 changed to L3/4 w/o difficulty

## 2018-06-26 NOTE — Transfer of Care (Signed)
Immediate Anesthesia Transfer of Care Note  Patient: Yolanda Peters  Procedure(s) Performed: TOTAL KNEE ARTHROPLASTY (Right Knee)  Patient Location: PACU  Anesthesia Type:Spinal  Level of Consciousness: awake, alert  and oriented  Airway & Oxygen Therapy: Patient Spontanous Breathing and Patient connected to face mask oxygen  Post-op Assessment: Report given to RN and Post -op Vital signs reviewed and stable  Post vital signs: Reviewed and stable  Last Vitals:  Vitals Value Taken Time  BP 101/59 06/26/18 1135  Temp    Pulse 72 06/26/18 1137  Resp 21 06/26/18 1137  SpO2 100 % 06/26/18 1137  Vitals shown include unvalidated device data.  Last Pain:  Vitals:   06/26/18 0905  TempSrc:   PainSc: 0-No pain      Patients Stated Pain Goal: 0 (94/07/68 0881)  Complications: No apparent anesthesia complications

## 2018-06-26 NOTE — Interval H&P Note (Signed)
History and Physical Interval Note:  06/26/2018 8:28 AM  Yolanda Peters  has presented today for surgery, with the diagnosis of Right knee osteoarthritis.  The various methods of treatment have been discussed with the patient and family. After consideration of risks, benefits and other options for treatment, the patient has consented to  Procedure(s) with comments: TOTAL KNEE ARTHROPLASTY (Right) - 70 mins as a surgical intervention.  The patient's history has been reviewed, patient examined, no change in status, stable for surgery.  I have reviewed the patient's chart and labs.  Questions were answered to the patient's satisfaction.     Mauri Pole

## 2018-06-26 NOTE — Discharge Instructions (Signed)

## 2018-06-26 NOTE — Anesthesia Procedure Notes (Signed)
Procedure Name: MAC Date/Time: 06/26/2018 9:29 AM Performed by: Maxwell Caul, CRNA Pre-anesthesia Checklist: Patient identified, Emergency Drugs available, Suction available and Patient being monitored Oxygen Delivery Method: Simple face mask

## 2018-06-26 NOTE — Anesthesia Postprocedure Evaluation (Signed)
Anesthesia Post Note  Patient: Yolanda Peters  Procedure(s) Performed: TOTAL KNEE ARTHROPLASTY (Right Knee)     Patient location during evaluation: PACU Anesthesia Type: Spinal Level of consciousness: oriented and awake and alert Pain management: pain level controlled Vital Signs Assessment: post-procedure vital signs reviewed and stable Respiratory status: spontaneous breathing, respiratory function stable and patient connected to nasal cannula oxygen Cardiovascular status: blood pressure returned to baseline and stable Postop Assessment: no headache, no backache and no apparent nausea or vomiting Anesthetic complications: no    Last Vitals:  Vitals:   06/26/18 1145 06/26/18 1221  BP: (!) 104/59 111/60  Pulse: 70 69  Resp: (!) 22 17  Temp:  36.4 C  SpO2: 100% 94%    Last Pain:  Vitals:   06/26/18 1221  TempSrc: Oral  PainSc: 0-No pain                 Presley Summerlin

## 2018-06-26 NOTE — Progress Notes (Signed)
AssistedDr. Oddono with right, ultrasound guided, adductor canal block. Side rails up, monitors on throughout procedure. See vital signs in flow sheet. Tolerated Procedure well.  

## 2018-06-26 NOTE — Op Note (Signed)
NAME:  Yolanda Peters                      MEDICAL RECORD NO.:  [id number]                             FACILITY:  [facility]      PHYSICIAN:  Pietro Cassis. Alvan Dame, M.D.  DATE OF BIRTH:  1940-12-20      DATE OF PROCEDURE:  06/26/2018                                     OPERATIVE REPORT         PREOPERATIVE DIAGNOSIS:  Right knee osteoarthritis.      POSTOPERATIVE DIAGNOSIS:  Right knee osteoarthritis.      FINDINGS:  The patient was noted to have complete loss of cartilage and   bone-on-bone arthritis with associated osteophytes in the lateral and patellofemoral compartments of   the knee with a significant synovitis and associated effusion.  The patient had failed months of conservative treatment including medications, injection therapy, activity modification.     PROCEDURE:  Right total knee replacement.      COMPONENTS USED:  DePuy Attune rotating platform posterior stabilized knee   system, a size 5N femur, 4 tibia, size 6 mm PS AOX insert, and 35 anatomic patellar   button.      SURGEON:  Pietro Cassis. Alvan Dame, M.D.      ASSISTANT:  Griffith Citron, PA-C.      ANESTHESIA:  Spinal.      SPECIMENS:  None.      COMPLICATION:  None.      DRAINS:  None.  EBL: <100      TOURNIQUET TIME:  29 min @ 24mmHg     The patient was stable to the recovery room.      INDICATION FOR PROCEDURE:  Yolanda Peters is a 78 y.o. female patient of   mine.  The patient had been seen, evaluated, and treated for months conservatively in the   office with medication, activity modification, and injections.  The patient had   radiographic changes of bone-on-bone arthritis with endplate sclerosis and osteophytes noted.  Based on the radiographic changes and failed conservative measures, the patient   decided to proceed with definitive treatment, total knee replacement.  Risks of infection, DVT, component failure, need for revision surgery, neurovascular injury were reviewed in the office setting.  The postop  course was reviewed stressing the efforts to maximize post-operative satisfaction and function.  Consent was obtained for benefit of pain   relief.      PROCEDURE IN DETAIL:  The patient was brought to the operative theater.   Once adequate anesthesia, preoperative antibiotics, 2 gm of Ancef,1 gm of Tranexamic Acid, and 10 mg of Decadron administered, the patient was positioned supine with a right thigh tourniquet placed.  The  right lower extremity was prepped and draped in sterile fashion.  A time-   out was performed identifying the patient, planned procedure, and the appropriate extremity.      The right lower extremity was placed in the [facility] leg holder.  The leg was   exsanguinated, tourniquet elevated to 250 mmHg.  A midline incision was   made followed by median parapatellar arthrotomy.  Following initial   exposure, attention was first directed to the  patella.  Precut   measurement was noted to be 22 mm.  I resected down to 14 mm and used a   35 anatomic patellar button to restore patellar height as well as cover the cut surface.      The lug holes were drilled and a metal shim was placed to protect the   patella from retractors and saw blade during the procedure.      At this point, attention was now directed to the femur.  The femoral   canal was opened with a drill, irrigated to try to prevent fat emboli.  An   intramedullary rod was passed at 3 degrees valgus, 9 mm of bone was   resected off the distal femur.  Following this resection, the tibia was   subluxated anteriorly.  Using the extramedullary guide, 2 mm of bone was resected off   the proximal lateral tibia.  We confirmed the gap would be   stable medially and laterally with a size 5 spacer block as well as confirmed that the tibial cut was perpendicular in the coronal plane, checking with an alignment rod.      Once this was done, I sized the femur to be a size 5 in the anterior-   posterior dimension, chose a narrow  component based on medial and   lateral dimension.  The size 5 rotation block was then pinned in   position anterior referenced using the C-clamp to set rotation.  The   anterior, posterior, and  chamfer cuts were made without difficulty nor   notching making certain that I was along the anterior cortex to help   with flexion gap stability.      The final box cut was made off the lateral aspect of distal femur.      At this point, the tibia was sized to be a size 4.  The size 4 tray was   then pinned in position through the medial third of the tubercle,   drilled, and keel punched.  Trial reduction was now carried with a 5 femur,  4 tibia, a size 5 then 6 mm PS insert, and the 35 anatomic patella botton.  The knee was brought to full extension with good flexion stability with the patella   tracking through the trochlea without application of pressure.  Given   all these findings the trial components removed.  Final components were   opened and cement was mixed.  The knee was irrigated with normal saline solution and pulse lavage.  The synovial lining was   then injected with 30 cc of 0.25% Marcaine with epinephrine, 1 cc of Toradol and 30 cc of NS for a total of 61 cc.     Final implants were then cemented onto cleaned and dried cut surfaces of bone with the knee brought to extension with a size 6 mm PS trial insert.      Once the cement had fully cured, excess cement was removed   throughout the knee.  I confirmed that I was satisfied with the range of   motion and stability, and the final size 6 mm PS AOX insert was chosen.  It was   placed into the knee.      The tourniquet had been let down at 29 minutes.  No significant   hemostasis was required.  The extensor mechanism was then reapproximated using #1 Vicryl and #1 Stratafix sutures with the knee   in flexion.  The   remaining  wound was closed with 2-0 Vicryl and running 4-0 Monocryl.   The knee was cleaned, dried, dressed  sterilely using Dermabond and   Aquacel dressing.  The patient was then   brought to recovery room in stable condition, tolerating the procedure   well.   Please note that Physician Assistant, Griffith Citron, PA-C was present for the entirety of the case, and was utilized for pre-operative positioning, peri-operative retractor management, general facilitation of the procedure and for primary wound closure at the end of the case.              Pietro Cassis Alvan Dame, M.D.    06/26/2018 9:49 AM

## 2018-06-27 ENCOUNTER — Encounter (HOSPITAL_COMMUNITY): Payer: Self-pay | Admitting: Orthopedic Surgery

## 2018-06-27 DIAGNOSIS — M1711 Unilateral primary osteoarthritis, right knee: Secondary | ICD-10-CM | POA: Diagnosis not present

## 2018-06-27 DIAGNOSIS — E663 Overweight: Secondary | ICD-10-CM | POA: Diagnosis present

## 2018-06-27 DIAGNOSIS — E876 Hypokalemia: Secondary | ICD-10-CM

## 2018-06-27 LAB — BASIC METABOLIC PANEL
Anion gap: 13 (ref 5–15)
BUN: 24 mg/dL — ABNORMAL HIGH (ref 8–23)
CO2: 25 mmol/L (ref 22–32)
Calcium: 7.7 mg/dL — ABNORMAL LOW (ref 8.9–10.3)
Chloride: 101 mmol/L (ref 98–111)
Creatinine, Ser: 0.82 mg/dL (ref 0.44–1.00)
GFR calc Af Amer: >60 mL/min (ref >60)
GFR calc non Af Amer: >60 mL/min (ref >60)
Glucose, Bld: 154 mg/dL — ABNORMAL HIGH (ref 70–99)
Potassium: 3 mmol/L — ABNORMAL LOW (ref 3.5–5.1)
Sodium: 139 mmol/L (ref 135–145)

## 2018-06-27 LAB — CBC
HCT: 31.8 % — ABNORMAL LOW (ref 36.0–46.0)
Hemoglobin: 9.9 g/dL — ABNORMAL LOW (ref 12.0–15.0)
MCH: 27.9 pg (ref 26.0–34.0)
MCHC: 31.1 g/dL (ref 30.0–36.0)
MCV: 89.6 fL (ref 80.0–100.0)
Platelets: 233 10*3/uL (ref 150–400)
RBC: 3.55 MIL/uL — ABNORMAL LOW (ref 3.87–5.11)
RDW: 14 % (ref 11.5–15.5)
WBC: 11.4 10*3/uL — ABNORMAL HIGH (ref 4.0–10.5)
nRBC: 0 % (ref 0.0–0.2)

## 2018-06-27 MED ORDER — HYDROCODONE-ACETAMINOPHEN 5-325 MG PO TABS: 1.0000 | ORAL_TABLET | ORAL | 0 refills | Status: DC | PRN

## 2018-06-27 MED ORDER — DOCUSATE SODIUM 100 MG PO CAPS: 100.0000 mg | ORAL_CAPSULE | Freq: Two times a day (BID) | ORAL | 0 refills | Status: DC

## 2018-06-27 MED ORDER — METHOCARBAMOL 500 MG PO TABS: 500.0000 mg | ORAL_TABLET | Freq: Four times a day (QID) | ORAL | 0 refills | Status: DC | PRN

## 2018-06-27 MED ORDER — POLYETHYLENE GLYCOL 3350 17 G PO PACK: 17.0000 g | PACK | Freq: Two times a day (BID) | ORAL | 0 refills | Status: DC

## 2018-06-27 MED ORDER — HYDROXYCHLOROQUINE SULFATE 200 MG PO TABS: ORAL_TABLET | ORAL | 0 refills | Status: DC

## 2018-06-27 MED ORDER — POTASSIUM CHLORIDE CRYS ER 20 MEQ PO TBCR
40.0000 meq | EXTENDED_RELEASE_TABLET | Freq: Once | ORAL | Status: AC
  Administered 2018-06-27: 40 meq via ORAL
  Filled 2018-06-27: qty 2

## 2018-06-27 MED ORDER — FERROUS SULFATE 325 (65 FE) MG PO TABS: 325.0000 mg | ORAL_TABLET | Freq: Three times a day (TID) | ORAL | 3 refills | Status: DC

## 2018-06-27 MED ORDER — ASPIRIN 81 MG PO CHEW: 81.0000 mg | CHEWABLE_TABLET | Freq: Two times a day (BID) | ORAL | 0 refills | Status: AC

## 2018-06-27 MED ORDER — POTASSIUM CHLORIDE ER 20 MEQ PO TBCR: 40.0000 meq | EXTENDED_RELEASE_TABLET | Freq: Every day | ORAL | 0 refills | Status: DC

## 2018-06-27 NOTE — Discharge Summary (Signed)
Patient ID: Yolanda Peters MRN: [id number] DOB/AGE: [birth year] 78 y.o.  Admit date: 06/26/2018 Discharge date: 06/27/2018  Admission Diagnoses:  Active Problems:   S/P right TKA   Hypokalemia   Overweight (BMI 25.0-29.9)   Discharge Diagnoses:  Same  Past Medical History:  Diagnosis Date  . Allergy   . Arthritis   . Breast cancer [facility]) 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  . Cancer ([place])    right breast   . Hyperlipidemia    takes Niacin  . Hypertension   . Personal history of radiation therapy 2011   rt breast    Surgeries: Procedure(s): TOTAL KNEE ARTHROPLASTY on 06/26/2018   Consultants:   Discharged Condition: Improved  Hospital Course: Yolanda Peters is an 78 y.o. female who was admitted 06/26/2018 for operative treatment of<principal problem not specified>. Patient has severe unremitting pain that affects sleep, daily activities, and work/hobbies. After pre-op clearance the patient was taken to the operating room on 06/26/2018 and underwent  Procedure(s): TOTAL KNEE ARTHROPLASTY.    Patient was given perioperative antibiotics:  Anti-infectives (From admission, onward)   Start     Dose/Rate Route Frequency Ordered Stop   06/27/18 0000  hydroxychloroquine (PLAQUENIL) 200 MG tablet        06/27/18 0905     06/26/18 1600  ceFAZolin (ANCEF) IVPB 2g/100 mL premix     2 g 200 mL/hr over 30 Minutes Intravenous Every 6 hours 06/26/18 1225 06/26/18 2308   06/26/18 0745  ceFAZolin (ANCEF) IVPB 2g/100 mL premix     2 g 200 mL/hr over 30 Minutes Intravenous On call to O.R. 06/26/18 0742 06/26/18 1004       Patient was given sequential compression devices, early ambulation, and chemoprophylaxis to prevent DVT.  Patient benefited maximally from hospital stay and there were no complications.    Recent vital signs:  Patient Vitals for the past 24 hrs:  BP Temp Temp src Pulse Resp SpO2  06/27/18 0511 113/70 97.9 F (36.6 C) -- 78 16 96 %  06/27/18 0053 115/68 97.6 F (36.4 C) -- 84  16 94 %  06/26/18 2105 127/70 98.1 F (36.7 C) Oral 78 (!) 22 96 %  06/26/18 1524 117/68 97.6 F (36.4 C) Oral 71 16 98 %  06/26/18 1426 112/67 -- -- 69 16 98 %  06/26/18 1327 121/67 -- -- 72 16 98 %  06/26/18 1221 111/60 97.6 F (36.4 C) Oral 69 17 94 %  06/26/18 1145 (!) 104/59 -- -- 70 (!) 22 100 %  06/26/18 1135 (!) 101/59 98.8 F (37.1 C) -- 74 18 93 %  06/26/18 0925 -- -- -- 79 (!) 22 97 %  06/26/18 0924 123/64 -- -- 80 19 96 %  06/26/18 0923 -- -- -- 79 16 98 %  06/26/18 0922 -- -- -- 80 (!) 21 96 %  06/26/18 0921 -- -- -- 79 (!) 21 97 %  06/26/18 0920 121/65 -- -- 79 (!) 22 97 %  06/26/18 0919 -- -- -- 79 (!) 23 97 %  06/26/18 0918 -- -- -- 80 (!) 21 97 %  06/26/18 0917 -- -- -- 82 20 97 %  06/26/18 0916 -- -- -- 81 19 98 %  06/26/18 0915 134/62 -- -- 81 (!) 21 99 %  06/26/18 0914 -- -- -- 80 (!) 23 99 %  06/26/18 0913 -- -- -- 80 (!) 21 97 %     Recent laboratory studies:  Recent Labs    06/27/18  0319  WBC 11.4*  HGB 9.9*  HCT 31.8*  PLT 233  NA 139  K 3.0*  CL 101  CO2 25  BUN 24*  CREATININE 0.82  GLUCOSE 154*  CALCIUM 7.7*     Discharge Medications:   Allergies as of 06/27/2018      Reactions   Penicillins Nausea Only, Swelling   Fever Did it involve swelling of the face/tongue/throat, SOB, or low BP? Unknown Did it involve sudden or severe rash/hives, skin peeling, or any reaction on the inside of your mouth or nose? No Did you need to seek medical attention at a hospital or doctor's office? Yes When did it last happen?60+ years ago If all above answers are "NO", may proceed with cephalosporin use.      Medication List    STOP taking these medications   Sodium Hyaluronate (Viscosup) 20 MG/2ML Soln Commonly known as: Hyalgan     TAKE these medications   aspirin 81 MG chewable tablet Commonly known as: Aspirin Childrens Chew 1 tablet (81 mg total) by mouth 2 (two) times daily for 30 days. Take for 4 weeks, then resume regular  dose. Start taking on: June 28, 2018   beta carotene 10000 UNIT capsule Take 10,000 Units by mouth daily.   BIOFREEZE EX Apply 1 application topically daily as needed (pain).   CALCIUM & MAGNESIUM CARBONATES PO Take 2 tablets by mouth daily.   docusate sodium 100 MG capsule Commonly known as: Colace Take 1 capsule (100 mg total) by mouth 2 (two) times daily.   ferrous sulfate 325 (65 FE) MG tablet Commonly known as: FerrouSul Take 1 tablet (325 mg total) by mouth 3 (three) times daily with meals.   GINGER ROOT PO Take 200 mg by mouth daily.   glucosamine-chondroitin 500-400 MG tablet Take 1 tablet by mouth daily.   hydrochlorothiazide 25 MG tablet Commonly known as: HYDRODIURIL Take 25 mg by mouth daily as needed (fluid or edema).   HYDROcodone-acetaminophen 5-325 MG tablet Commonly known as: NORCO/VICODIN Take 1-2 tablets by mouth every 4 (four) hours as needed.   hydroxychloroquine 200 MG tablet Commonly known as: PLAQUENIL Do not use for 2 weeks after surgery. What changed: See the new instructions.   loratadine 10 MG tablet Commonly known as: CLARITIN Take 10 mg by mouth daily.   methocarbamol 500 MG tablet Commonly known as: Robaxin Take 1 tablet (500 mg total) by mouth every 6 (six) hours as needed for muscle spasms.   polyethylene glycol 17 g packet Commonly known as: MIRALAX / GLYCOLAX Take 17 g by mouth 2 (two) times daily.   Potassium Chloride ER 20 MEQ Tbcr Take 40 mEq by mouth daily for 4 days.   PROBIOTIC DAILY PO Take 1 capsule by mouth daily.   TART CHERRY ADVANCED PO Take 465 mg by mouth daily.   trimethoprim 100 MG tablet Commonly known as: TRIMPEX Take 100 mg by mouth 3 (three) times a week.   verapamil 360 MG 24 hr capsule Commonly known as: VERELAN PM Take 360 mg by mouth daily.   vitamin C 1000 MG tablet Take 1,000 mg by mouth daily.   VITAMIN D-3 PO Take 2,000 Units by mouth daily.   vitamin E 1000 UNIT capsule Take  1,000 Units by mouth daily.            Discharge Care Instructions  (From admission, onward)         Start     Ordered   06/27/18 0000  Change dressing    Comments: Maintain surgical dressing until follow up in the clinic. If the edges start to pull up, may reinforce with tape. If the dressing is no longer working, may remove and cover with gauze and tape, but must keep the area dry and clean.  Call with any questions or concerns.   06/27/18 0910          Diagnostic Studies: No results found.  Disposition: Discharge Home        Discharge Instructions    Call MD / Call 911   Complete by: As directed    If you experience chest pain or shortness of breath, CALL 911 and be transported to the hospital emergency room.  If you develope a fever above 101 F, pus (white drainage) or increased drainage or redness at the wound, or calf pain, call your surgeon's office.   Change dressing   Complete by: As directed    Maintain surgical dressing until follow up in the clinic. If the edges start to pull up, may reinforce with tape. If the dressing is no longer working, may remove and cover with gauze and tape, but must keep the area dry and clean.  Call with any questions or concerns.   Constipation Prevention   Complete by: As directed    Drink plenty of fluids.  Prune juice may be helpful.  You may use a stool softener, such as Colace (over the counter) 100 mg twice a day.  Use MiraLax (over the counter) for constipation as needed.   Diet - low sodium heart healthy   Complete by: As directed    Discharge instructions   Complete by: As directed    Maintain surgical dressing until follow up in the clinic. If the edges start to pull up, may reinforce with tape. If the dressing is no longer working, may remove and cover with gauze and tape, but must keep the area dry and clean.  Follow up in 2 weeks at [facility]. Call with any questions or concerns.   Increase activity slowly  as tolerated   Complete by: As directed    Weight bearing as tolerated with assist device (walker, cane, etc) as directed, use it as long as suggested by your surgeon or therapist, typically at least 4-6 weeks.   TED hose   Complete by: As directed    Use stockings (TED hose) for 2 weeks on both leg(s).  You may remove them at night for sleeping.      Follow-up Information    Paralee Cancel, MD. Schedule an appointment as soon as possible for a visit in 2 weeks.   Specialty: Orthopedic Surgery Contact information: [address] [place] [address] [phone]            Signed: Lucille Passy [facility] 06/27/2018, 9:12 AM

## 2018-06-27 NOTE — Progress Notes (Signed)
Physical Therapy Treatment Patient Details Name: Yolanda Peters MRN: [id number] DOB: November 16, 1940 Today's Date: 06/27/2018    History of Present Illness 78 yo female s/p R TKR on 06/26/18. PMH includes hip pain, HTN, HLD, R breast cancer with partial mastectomy.    PT Comments    Pt ambulated in hallway and practiced one step.  Pt also performed LE exercises and provided with HEP. Pt had no further questions and very eager for d/c home.    Follow Up Recommendations  Follow surgeon's recommendation for DC plan and follow-up therapies;Supervision for mobility/OOB     Equipment Recommendations  None recommended by PT    Recommendations for Other Services       Precautions / Restrictions Precautions Precautions: Fall;Knee Restrictions RLE Weight Bearing: Weight bearing as tolerated    Mobility  Bed Mobility Overal bed mobility: Needs Assistance Bed Mobility: Supine to Sit     Supine to sit: Supervision        Transfers Overall transfer level: Needs assistance Equipment used: Rolling walker (2 wheeled) Transfers: Sit to/from Stand Sit to Stand: Min guard;Supervision         General transfer comment: verbal cues for hand placement  Ambulation/Gait Ambulation/Gait assistance: Min guard;Supervision Gait Distance (Feet): 140 Feet Assistive device: Rolling walker (2 wheeled) Gait Pattern/deviations: Step-through pattern;Decreased stride length;Antalgic Gait velocity: decr   General Gait Details: verbal cues for step length, RW positioning, posture   Stairs Stairs: Yes Stairs assistance: Min guard Stair Management: Step to pattern;Forwards;With walker Number of Stairs: 1 General stair comments: verbal cues for safe technique, sequence, RW positioning; pt reports understanding   Wheelchair Mobility    Modified Rankin (Stroke Patients Only)       Balance                                            Cognition Arousal/Alertness:  Awake/alert Behavior During Therapy: WFL for tasks assessed/performed Overall Cognitive Status: Within Functional Limits for tasks assessed                                        Exercises Total Joint Exercises Ankle Circles/Pumps: AROM;10 reps;Both Quad Sets: AROM;Right;10 reps Short Arc Quad: AROM;Right;10 reps Heel Slides: AAROM;Seated;10 reps;Right Hip ABduction/ADduction: AROM;10 reps;Right Straight Leg Raises: AROM;Right;10 reps Long Arc Quad: AROM;Right;10 reps    General Comments        Pertinent Vitals/Pain Pain Assessment: 0-10 Pain Score: 3  Pain Descriptors / Indicators: Aching;Sore;Tightness Pain Intervention(s): Limited activity within patient's tolerance;Monitored during session    Home Living                      Prior Function            PT Goals (current goals can now be found in the care plan section) Progress towards PT goals: Progressing toward goals    Frequency    7X/week      PT Plan Current plan remains appropriate    Co-evaluation              AM-PAC PT "6 Clicks" Mobility   Outcome Measure  Help needed turning from your back to your side while in a flat bed without using bedrails?: A Little Help needed moving from lying on your back  to sitting on the side of a flat bed without using bedrails?: A Little Help needed moving to and from a bed to a chair (including a wheelchair)?: A Little Help needed standing up from a chair using your arms (e.g., wheelchair or bedside chair)?: A Little Help needed to walk in hospital room?: A Little Help needed climbing 3-5 steps with a railing? : A Little 6 Click Score: 18    End of Session Equipment Utilized During Treatment: Gait belt Activity Tolerance: Patient tolerated treatment well Patient left: in chair;with call bell/phone within reach Nurse Communication: Mobility status PT Visit Diagnosis: Other abnormalities of gait and mobility (R26.89);Difficulty in  walking, not elsewhere classified (R26.2)     Time: 1010-1037 PT Time Calculation (min) (ACUTE ONLY): 27 min  Charges:  $Gait Training: 8-22 mins $Therapeutic Exercise: 8-22 mins                    Carmelia Bake, PT, DPT Acute Rehabilitation Services Office: [phone] Pager: [phone]  Trena Platt 06/27/2018, 1:20 PM

## 2018-06-27 NOTE — Progress Notes (Signed)
     Subjective: 1 Day Post-Op Procedure(s) (LRB): TOTAL KNEE ARTHROPLASTY (Right)   Patient reports pain as mild, pain controlled. No events throughout the night. We discussed her hypokalemia this morning.  Discussed the procedure and expectations.  Ready to be discharged home.    Patient's anticipated LOS is less than 2 midnights, meeting these requirements: - Lives within 1 hour of care - Has a competent adult at home to recover with post-op recover - NO history of  - Chronic pain requiring opiods  - Diabetes  - Coronary Artery Disease  - Heart failure  - Heart attack  - Stroke  - DVT/VTE  - Cardiac arrhythmia  - Respiratory Failure/COPD  - Renal failure  - Anemia  - Advanced Liver disease       Objective:   VITALS:   Vitals:   06/27/18 0053 06/27/18 0511  BP: 115/68 113/70  Pulse: 84 78  Resp: 16 16  Temp: 97.6 F (36.4 C) 97.9 F (36.6 C)  SpO2: 94% 96%    Dorsiflexion/Plantar flexion intact Incision: dressing C/D/I No cellulitis present Compartment soft  LABS Recent Labs    06/27/18 0319  HGB 9.9*  HCT 31.8*  WBC 11.4*  PLT 233    Recent Labs    06/27/18 0319  NA 139  K 3.0*  BUN 24*  CREATININE 0.82  GLUCOSE 154*     Assessment/Plan: 1 Day Post-Op Procedure(s) (LRB): TOTAL KNEE ARTHROPLASTY (Right) Foley cath d/c'ed Advance diet Up with therapy D/C IV fluids Discharge home Follow up in 2 weeks at [facility] Follow up with OLIN,Kyoko Elsea D in 2 weeks.  Contact information:  [facility]) [address] [place] [phone]    Overweight (BMI 25-29.9) Estimated body mass index is 25.94 kg/m as calculated from the following:   Height as of 06/21/18: 5\' 5"  (1.651 m).   Weight as of 06/21/18: 70.7 kg. Patient also counseled that weight may inhibit the healing process Patient counseled that losing weight will help with future  health issues  Hypokalemia Treated with oral potassium and will observe    [place] Pugh. Jamill Wetmore   PAC  06/27/2018, 8:59 AM

## 2018-06-28 ENCOUNTER — Encounter: Payer: Self-pay | Admitting: Physical Therapy

## 2018-06-28 ENCOUNTER — Ambulatory Visit: Payer: Medicare Other | Attending: Orthopedic Surgery | Admitting: Physical Therapy

## 2018-06-28 ENCOUNTER — Other Ambulatory Visit: Payer: Self-pay

## 2018-06-28 DIAGNOSIS — R600 Localized edema: Secondary | ICD-10-CM | POA: Diagnosis present

## 2018-06-28 DIAGNOSIS — M6281 Muscle weakness (generalized): Secondary | ICD-10-CM | POA: Insufficient documentation

## 2018-06-28 DIAGNOSIS — M25661 Stiffness of right knee, not elsewhere classified: Secondary | ICD-10-CM | POA: Diagnosis present

## 2018-06-28 DIAGNOSIS — M25561 Pain in right knee: Secondary | ICD-10-CM

## 2018-06-28 DIAGNOSIS — R2689 Other abnormalities of gait and mobility: Secondary | ICD-10-CM

## 2018-06-28 NOTE — Therapy (Signed)
[facility] [address] [place], [place], [address] Phone: [phone]   Fax:  [phone]  Physical Therapy Evaluation  Patient Details  Name: Yolanda Peters MRN: [id number] Date of Birth: 06/24/1940 Referring Provider (PT): Dr. Katherine Roan PA   Encounter Date: 06/28/2018  PT End of Session - 06/28/18 1709    Visit Number  1    Date for PT Re-Evaluation  08/23/18    PT Start Time  1230    PT Stop Time  5885    PT Time Calculation (min)  47 min    Activity Tolerance  Patient tolerated treatment well       Past Medical History:  Diagnosis Date  . Allergy   . Arthritis   . Breast cancer [facility]) 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  . Cancer ([place])    right breast   . Hyperlipidemia    takes Niacin  . Hypertension   . Personal history of radiation therapy 2011   rt breast    Past Surgical History:  Procedure Laterality Date  . BREAST DUCTAL SYSTEM EXCISION     right breast  . BREAST LUMPECTOMY    . BREAST SURGERY  august 2011   right partial mastectomy  . CATARACT EXTRACTION     bilateral  . CESAREAN SECTION    . ELBOW SURGERY     left  . MYOMECTOMY    . TOTAL KNEE ARTHROPLASTY Right 06/26/2018   Procedure: TOTAL KNEE ARTHROPLASTY;  Surgeon: Paralee Cancel, MD;  Location: [facility];  Service: Orthopedics;  Laterality: Right;  70 mins    There were no vitals filed for this visit.   Subjective Assessment - 06/28/18 1237    Subjective  Right knee pain for years and worsened this year.  s/p right TKA 6/16.  Used a cane to walk to mailbox before surgery.    Pertinent History  Right TKR 06/26/18 hx of Breast Cancer    Limitations  Walking;House hold activities    How long can you sit comfortably?  no problem    How long can you walk comfortably?  house to car    Diagnostic tests  xray    Patient Stated Goals  drive a car again;  be walking normal and painfree    Currently in Pain?  Yes    Pain Score  5     Pain Location  Knee    Pain Orientation  Right    Pain Type  Surgical pain    Pain Onset  In the past 7 days    Pain Frequency  Constant    Aggravating Factors   bending it    Pain Relieving Factors  ice; pain medicine         [facility] PT Assessment - 06/28/18 0001      Assessment   Medical Diagnosis  right TKA for osteoarthritis     Referring Provider (PT)  Dr. Alvan Dame    Onset Date/Surgical Date  06/26/18    Next MD Visit  7/2 or 7/3    Prior Therapy  for knee      Precautions   Precautions  None      Restrictions   Weight Bearing Restrictions  No      Balance Screen   Has the patient fallen in the past 6 months  No    Has the patient had a decrease in activity level because of a fear of falling?   No  Is the patient reluctant to leave their home because of a fear of falling?   No      Home Film/video editor residence    Living Arrangements  Spouse/significant other    Available Help at Discharge  Family    Type of [place] to enter    Entrance Stairs-Number of Steps  2    [place]  One level    [place] - 2 wheels      Prior Function   Level of Independence  Independent with basic ADLs    Vocation  Retired    Therapist, occupational, book club       Observation/Other Assessments   Observations  dressing covering surgical incision/ bil TEN hose on    Focus on Therapeutic Outcomes (FOTO)   63% limitation       Circumferential Edema   Circumferential - Right  mid patella 49 1/4 cm   atrophy right quad 10 cm above superior pole 1/2 cm smaller    Circumferential - Left   mid patella 44 1/2 cm      AROM   Right Knee Extension  -20   supine -8   Right Knee Flexion  92    Left Knee Extension  0    Left Knee Flexion  130      Strength   Overall Strength Comments  able to do 10x SLR independently but with quad lag    Right Knee Flexion  3/5    Right Knee Extension  3-/5    Left Knee  Flexion  5/5    Left Knee Extension  5/5      Ambulation/Gait   Ambulation/Gait  Yes    Ambulation/Gait Assistance  5: Supervision    Ambulation Distance (Feet)  80 Feet    Assistive device  Rolling walker    Gait Comments  decreased stance time on right;  decreased knee flexion and extension       Timed Up and Go Test   Normal TUG (seconds)  30   with RW               Objective measurements completed on examination: See above findings.      [place] Adult PT Treatment/Exercise - 06/28/18 0001      Knee/Hip Exercises: Seated   Other Seated Knee/Hip Exercises  floor slider 15x      Knee/Hip Exercises: Supine   Short Arc Quad Sets  Right;10 reps    Straight Leg Raises  AROM;Right;10 reps    Other Supine Knee/Hip Exercises  green ball rolls for flexion 15x    Other Supine Knee/Hip Exercises  HS sets on green ball 10x             PT Education - 06/28/18 1705    Education Details  Access Code: [email or web address]   retro stepping;  seated floor slides;  seated heel prop for full extension;  standing WB on right with small left LE movement    Person(s) Educated  Patient    Methods  Explanation;Demonstration;Handout    Comprehension  Returned demonstration;Verbalized understanding       PT Short Term Goals - 06/28/18 1723      PT SHORT TERM GOAL #1   Title  be independent in initial HEP    Time  4    Period  Weeks    Status  New  Target Date  07/26/18      PT SHORT TERM GOAL #2   Title  Right knee extension improved to -5 degrees needed for improved standing and walking tolerance    Time  4    Period  Weeks    Status  New      PT SHORT TERM GOAL #3   Title  Right knee flexion improved to 100 degrees for greater ease getting in/out of the car    Time  4    Period  Weeks    Status  New      PT SHORT TERM GOAL #4   Title  The patient will be able to walk household distances with a cane    Time  4    Period  Weeks    Status  New        PT Long Term Goals  - 06/28/18 1725      PT LONG TERM GOAL #1   Title  be independent in advanced HEP    Time  8    Period  Weeks    Status  New    Target Date  08/23/18      PT LONG TERM GOAL #2   Title  reduce FOTO to < or = to 27% limitation    Time  8    Period  Weeks    Status  New      PT LONG TERM GOAL #3   Title  Right knee ROM 3-112 degrees needed with sit to stand and negotiating curbs and stairs    Time  8    Period  Weeks    Status  New      PT LONG TERM GOAL #4   Title  The patient will have improved right LE strength to grossly 4/5 needed for walking community distances    Time  8    Period  Weeks    Status  On-going      PT LONG TERM GOAL #5   Title  The patient will have improved gait speed indicated with Timed up and Go < 20 sec    Time  8    Period  Weeks    Status  New      Additional Long Term Goals   Additional Long Term Goals  Yes      PT LONG TERM GOAL #6   Title  Decreased mid patella circumferential measurement right knee to 47 1/2 cm    Time  8    Period  Weeks    Status  New             Plan - 06/28/18 1710    Clinical Impression Statement  The patient is 2 days s/p right TKR (06/26/18).  She presents with a RW and reports she was able to manage the step to enter/exit home and get in/out of the car fairly well with the assistance of her daughter.  Moderate edema present per circumferential measurements.  She needs UE assist to maneuver her LE at times but she is able to perform SLR without assist but with a quad lag.  Knee ROM seated:  20-92, supine:  8-84.  She would benefit from PT to address ROM, strength, proprioceptive, edema, pain and functional deficits to return to her previous active lifestyle.    Personal Factors and Comorbidities  Age    Examination-Activity Limitations  Bathing;Bed Mobility;Stairs;Locomotion Level;Lift;Stand    Examination-Participation Restrictions  Community Activity;Driving;Shop;Volunteer  Stability/Clinical Decision  Making  Stable/Uncomplicated    Clinical Decision Making  Low    Rehab Potential  Good    PT Frequency  3x / week    PT Duration  8 weeks    PT Treatment/Interventions  ADLs/Self Care Home Management;Cryotherapy;Neuromuscular re-education;Therapeutic exercise;Therapeutic activities;Patient/family education;Manual techniques;Taping;Vasopneumatic Device;Electrical Stimulation;Moist Heat    PT Next Visit Plan  right knee ROM, quad activation;  Nu-Step;  cryotherapy for edema control    Consulted and Agree with Plan of Care  Patient       Patient will benefit from skilled therapeutic intervention in order to improve the following deficits and impairments:  Abnormal gait, Decreased range of motion, Difficulty walking, Decreased activity tolerance, Pain, Decreased strength, Decreased mobility, Increased edema  Visit Diagnosis: 1. Acute pain of right knee   2. Stiffness of right knee, not elsewhere classified   3. Muscle weakness (generalized)   4. Other abnormalities of gait and mobility   5. Localized edema        Problem List Patient Active Problem List   Diagnosis Date Noted  . Hypokalemia 06/27/2018  . Overweight (BMI 25.0-29.9) 06/27/2018  . S/P right TKA 06/26/2018  . Autoimmune disease ([place]) 03/15/2016  . High risk medication use 03/15/2016  . Pain in joint of right shoulder 03/15/2016  . Bilateral hip pain 03/15/2016  . Primary osteoarthritis of both knees 03/15/2016  . 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 Arthritis 02/24/2014  . HTN (hypertension) 02/24/2014  . Hyperlipidemia 02/24/2014  . Breast cancer, right, upper outer quadrant  07/05/2010   Ruben Im, PT 06/28/18 5:34 PM Phone: [phone] Fax: [phone] Alvera Singh 06/28/2018, 5:34 PM  [facility] [address], [place], [place], [address] Phone: [phone]   Fax:  [phone]  Name: Yolanda Peters MRN: [id number] Date of  Birth: [birth year]

## 2018-06-28 NOTE — Patient Instructions (Signed)
Access Code: [email or web address]  URL: [email or web address]  Date: 06/28/2018  Prepared by: Ruben Im   Exercises  Backward Weight Shift and Opposite Arm Raise with Walker - 10 reps - 3 sets - 3x daily - 7x weekly  Seated Knee Extension Stretch with Chair - 1 reps - 1 sets - 60 hold - 3x daily - 7x weekly  Seated Knee Flexion Stretch - 10 reps - 3 sets - 3x daily - 7x weekly  Standing Heel Raise with Chair Support - 10 reps - 1 sets - 1x daily - 7x weekly  Standing Hip Flexion AROM - 10 reps - 3 sets - 1x daily - 7x weekly

## 2018-06-29 ENCOUNTER — Encounter: Payer: Self-pay | Admitting: Physical Therapy

## 2018-06-29 ENCOUNTER — Ambulatory Visit: Payer: Medicare Other | Admitting: Physical Therapy

## 2018-06-29 DIAGNOSIS — R2689 Other abnormalities of gait and mobility: Secondary | ICD-10-CM

## 2018-06-29 DIAGNOSIS — M6281 Muscle weakness (generalized): Secondary | ICD-10-CM

## 2018-06-29 DIAGNOSIS — M25561 Pain in right knee: Secondary | ICD-10-CM | POA: Diagnosis not present

## 2018-06-29 DIAGNOSIS — M25661 Stiffness of right knee, not elsewhere classified: Secondary | ICD-10-CM

## 2018-06-29 DIAGNOSIS — R600 Localized edema: Secondary | ICD-10-CM

## 2018-06-29 NOTE — Therapy (Signed)
[facility] [address], [place], [place], [address] Phone: [phone]   Fax:  [phone]  Physical Therapy Treatment  Patient Details  Name: Yolanda Peters MRN: [id number] Date of Birth: 07/07/1940 Referring Provider (PT): Dr. Katherine Roan PA   Encounter Date: 06/29/2018  PT End of Session - 06/29/18 1100    Visit Number  2    Date for PT Re-Evaluation  08/23/18    PT Start Time  1100    PT Stop Time  1148    PT Time Calculation (min)  48 min    Equipment Utilized During Treatment  Gait belt    Activity Tolerance  Patient tolerated treatment well    Behavior During Therapy  [facility] for tasks assessed/performed       Past Medical History:  Diagnosis Date  . Allergy   . Arthritis   . Breast cancer [facility]) 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  . Cancer ([place])    right breast   . Hyperlipidemia    takes Niacin  . Hypertension   . Personal history of radiation therapy 2011   rt breast    Past Surgical History:  Procedure Laterality Date  . BREAST DUCTAL SYSTEM EXCISION     right breast  . BREAST LUMPECTOMY    . BREAST SURGERY  august 2011   right partial mastectomy  . CATARACT EXTRACTION     bilateral  . CESAREAN SECTION    . ELBOW SURGERY     left  . MYOMECTOMY    . TOTAL KNEE ARTHROPLASTY Right 06/26/2018   Procedure: TOTAL KNEE ARTHROPLASTY;  Surgeon: Paralee Cancel, MD;  Location: [facility];  Service: Orthopedics;  Laterality: Right;  70 mins    There were no vitals filed for this visit.  Subjective Assessment - 06/29/18 1100    Subjective  Knee is feeling ok.  About a 5/10.    Pertinent History  Right TKR 06/26/18 hx of Breast Cancer    Limitations  Walking;House hold activities    Patient Stated Goals  drive a car again;  be walking normal and painfree    Currently in Pain?  Yes    Pain Score  5     Pain Location  Knee    Pain Orientation  Right    Pain Descriptors / Indicators  Aching    Pain Type   Surgical pain    Pain Onset  In the past 7 days    Pain Frequency  Constant    Aggravating Factors   bending    Pain Relieving Factors  ice, meds                       [facility] Adult PT Treatment/Exercise - 06/29/18 0001      Ambulation/Gait   Ambulation/Gait  Yes    Ambulation/Gait Assistance  5: Supervision    Ambulation Distance (Feet)  100 Feet    Assistive device  Rolling walker    Gait Comments  decreased stance time on right;  decreased knee flexion and extension       Exercises   Exercises  Knee/Hip      Knee/Hip Exercises: Aerobic   Nustep  5' level 2   end of session before vaso     Knee/Hip Exercises: Seated   Other Seated Knee/Hip Exercises  floor slider 20x   knee flexion to 100 deg     Knee/Hip Exercises: Supine   Short  Arc Land Sets Limitations  semi-reclined on wedge with blue bolster under knees    Knee Extension Limitations  supine heel prop x 2' for knee extension    Knee Flexion  AROM    Knee Flexion Limitations  green ball rolls for knee flexion x 15    Other Supine Knee/Hip Exercises  hamstring and glute sets feet on green ball x 20 each      Modalities   Modalities  Vasopneumatic      Vasopneumatic   Number Minutes Vasopneumatic   10 minutes    Vasopnuematic Location   Knee   Rt   Vasopneumatic Pressure  Low    Vasopneumatic Temperature   40 deg               PT Short Term Goals - 06/29/18 1154      PT SHORT TERM GOAL #1   Title  be independent in initial HEP    Status  On-going      PT SHORT TERM GOAL #2   Title  Right knee extension improved to -5 degrees needed for improved standing and walking tolerance    Status  On-going      PT SHORT TERM GOAL #3   Title  Right knee flexion improved to 100 degrees for greater ease getting in/out of the car    Status  On-going        PT Long Term Goals - 06/28/18 1725      PT LONG TERM GOAL #1   Title  be independent in  advanced HEP    Time  8    Period  Weeks    Status  New    Target Date  08/23/18      PT LONG TERM GOAL #2   Title  reduce FOTO to < or = to 27% limitation    Time  8    Period  Weeks    Status  New      PT LONG TERM GOAL #3   Title  Right knee ROM 3-112 degrees needed with sit to stand and negotiating curbs and stairs    Time  8    Period  Weeks    Status  New      PT LONG TERM GOAL #4   Title  The patient will have improved right LE strength to grossly 4/5 needed for walking community distances    Time  8    Period  Weeks    Status  On-going      PT LONG TERM GOAL #5   Title  The patient will have improved gait speed indicated with Timed up and Go < 20 sec    Time  8    Period  Weeks    Status  New      Additional Long Term Goals   Additional Long Term Goals  Yes      PT LONG TERM GOAL #6   Title  Decreased mid patella circumferential measurement right knee to 47 1/2 cm    Time  8    Period  Weeks    Status  New            Plan - 06/29/18 1144    Clinical Impression Statement  Pt is 3 days post-op Rt TKR and doing well.  She ambulated with improving step length with 2 wheel walker and achieved knee extension to -8 deg and  seated floor slider flexion to 100 deg today.  PT noted activation of Rt VMO with quad sets today despite muscle atrophy presence.  She reported fatigue and ache end of session and requested to stop the NuStep after 5 min today.  Vasopneumatic was used end of session for pain and edema control.  Pt will continue to benefit from skilled PT along POC for post-op deficits.    PT Frequency  3x / week    PT Duration  8 weeks    PT Treatment/Interventions  ADLs/Self Care Home Management;Cryotherapy;Neuromuscular re-education;Therapeutic exercise;Therapeutic activities;Patient/family education;Manual techniques;Taping;Vasopneumatic Device;Electrical Stimulation;Moist Heat    PT Next Visit Plan  gait, weight shifts, knee ROM, quad activation, Nu-Step,  vaso for edema    PT Home Exercise Plan  Access Code: [email or web address]    Consulted and Agree with Plan of Care  Patient       Patient will benefit from skilled therapeutic intervention in order to improve the following deficits and impairments:     Visit Diagnosis: 1. Acute pain of right knee   2. Stiffness of right knee, not elsewhere classified   3. Muscle weakness (generalized)   4. Other abnormalities of gait and mobility   5. Localized edema        Problem List Patient Active Problem List   Diagnosis Date Noted  . Hypokalemia 06/27/2018  . Overweight (BMI 25.0-29.9) 06/27/2018  . S/P right TKA 06/26/2018  . Autoimmune disease ([place]) 03/15/2016  . High risk medication use 03/15/2016  . Pain in joint of right shoulder 03/15/2016  . Bilateral hip pain 03/15/2016  . Primary osteoarthritis of both knees 03/15/2016  . 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 Arthritis 02/24/2014  . HTN (hypertension) 02/24/2014  . Hyperlipidemia 02/24/2014  . Breast cancer, right, upper outer quadrant  07/05/2010   Baruch Merl, PT 06/29/18 11:55 AM   [facility] [address] [place], [place], [address] Phone: [phone]   Fax:  [phone]  Name: DELOISE MARCHANT MRN: [id number] Date of Birth: [birth year]

## 2018-07-02 ENCOUNTER — Other Ambulatory Visit: Payer: Self-pay

## 2018-07-02 ENCOUNTER — Encounter: Payer: Self-pay | Admitting: Physical Therapy

## 2018-07-02 ENCOUNTER — Ambulatory Visit: Payer: Medicare Other | Admitting: Physical Therapy

## 2018-07-02 DIAGNOSIS — M25561 Pain in right knee: Secondary | ICD-10-CM | POA: Diagnosis not present

## 2018-07-02 DIAGNOSIS — M25661 Stiffness of right knee, not elsewhere classified: Secondary | ICD-10-CM

## 2018-07-02 DIAGNOSIS — R600 Localized edema: Secondary | ICD-10-CM

## 2018-07-02 DIAGNOSIS — R2689 Other abnormalities of gait and mobility: Secondary | ICD-10-CM

## 2018-07-02 DIAGNOSIS — M6281 Muscle weakness (generalized): Secondary | ICD-10-CM

## 2018-07-02 NOTE — Therapy (Signed)
[facility] [address], [place], [place], [address] Phone: [phone]   Fax:  [phone]  Physical Therapy Treatment  Patient Details  Name: Yolanda Peters MRN: [id number] Date of Birth: 1940-11-27 Referring Provider (PT): Dr. Katherine Roan PA   Encounter Date: 07/02/2018  PT End of Session - 07/02/18 1802    Visit Number  3    Date for PT Re-Evaluation  08/23/18    PT Start Time  7680    PT Stop Time  1851    PT Time Calculation (min)  53 min    Activity Tolerance  Patient tolerated treatment well    Behavior During Therapy  [facility] for tasks assessed/performed       Past Medical History:  Diagnosis Date  . Allergy   . Arthritis   . Breast cancer [facility]) 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  . Cancer ([place])    right breast   . Hyperlipidemia    takes Niacin  . Hypertension   . Personal history of radiation therapy 2011   rt breast    Past Surgical History:  Procedure Laterality Date  . BREAST DUCTAL SYSTEM EXCISION     right breast  . BREAST LUMPECTOMY    . BREAST SURGERY  august 2011   right partial mastectomy  . CATARACT EXTRACTION     bilateral  . CESAREAN SECTION    . ELBOW SURGERY     left  . MYOMECTOMY    . TOTAL KNEE ARTHROPLASTY Right 06/26/2018   Procedure: TOTAL KNEE ARTHROPLASTY;  Surgeon: Paralee Cancel, MD;  Location: [facility];  Service: Orthopedics;  Laterality: Right;  70 mins    There were no vitals filed for this visit.  Subjective Assessment - 07/02/18 1759    Subjective  Pain meds have been giving me some side effects that don't make me feel very good.  Otherwise doing ok.  I feel pain in the knee mostly when turning over in bed. I want you to look at my thigh bruising.   Pertinent History  Right TKR 06/26/18 hx of Breast Cancer    Limitations  Walking;House hold activities    How long can you sit comfortably?  no problem    How long can you walk comfortably?  house to car    Diagnostic  tests  xray    Patient Stated Goals  drive a car again;  be walking normal and painfree    Currently in Pain?  Yes    Pain Score  4     Pain Location  Knee    Pain Orientation  Right    Pain Descriptors / Indicators  Aching    Pain Type  Surgical pain    Pain Onset  In the past 7 days    Pain Frequency  Constant    Aggravating Factors   bending    Pain Relieving Factors  ice, meds                       [facility] Adult PT Treatment/Exercise - 07/02/18 0001      Knee/Hip Exercises: Stretches   Active Hamstring Stretch  Right;30 seconds;2 reps    Active Hamstring Stretch Limitations  slant board with bil UE support      Knee/Hip Exercises: Aerobic   Nustep  6' level 1, seat 10   PT present to discuss HEP and symptoms     Knee/Hip Exercises: Standing  Other Standing Knee Exercises  weight shifts into Rt LE with cues to stand tall through hip and knee      Knee/Hip Exercises: Supine   Short Arc Quad Sets  Strengthening;20 reps    Short Arc Quad Sets Limitations  with ball squeeze, semi-reclined on blue wedge with bolster under knees    Knee Flexion  AROM    Knee Flexion Limitations  red ball rolls for knee flexion    Other Supine Knee/Hip Exercises  hamstring and glute sets on Rt, foot on red ball, 10x3 sec holds      Modalities   Modalities  Vasopneumatic      Vasopneumatic   Number Minutes Vasopneumatic   15 minutes    Vasopnuematic Location   Knee    Vasopneumatic Pressure  Low    Vasopneumatic Temperature   34 deg               PT Short Term Goals - 07/02/18 1803      PT SHORT TERM GOAL #1   Title  be independent in initial HEP    Status  On-going      PT SHORT TERM GOAL #2   Title  Right knee extension improved to -5 degrees needed for improved standing and walking tolerance    Status  On-going      PT SHORT TERM GOAL #3   Title  Right knee flexion improved to 100 degrees for greater ease getting in/out of the car    Status  On-going         PT Long Term Goals - 06/28/18 1725      PT LONG TERM GOAL #1   Title  be independent in advanced HEP    Time  8    Period  Weeks    Status  New    Target Date  08/23/18      PT LONG TERM GOAL #2   Title  reduce FOTO to < or = to 27% limitation    Time  8    Period  Weeks    Status  New      PT LONG TERM GOAL #3   Title  Right knee ROM 3-112 degrees needed with sit to stand and negotiating curbs and stairs    Time  8    Period  Weeks    Status  New      PT LONG TERM GOAL #4   Title  The patient will have improved right LE strength to grossly 4/5 needed for walking community distances    Time  8    Period  Weeks    Status  On-going      PT LONG TERM GOAL #5   Title  The patient will have improved gait speed indicated with Timed up and Go < 20 sec    Time  8    Period  Weeks    Status  New      Additional Long Term Goals   Additional Long Term Goals  Yes      PT LONG TERM GOAL #6   Title  Decreased mid patella circumferential measurement right knee to 47 1/2 cm    Time  8    Period  Weeks    Status  New            Plan - 07/02/18 1845    Clinical Impression Statement  PT noted bandaging had rolled down and formed a tourniquet effect above Rt  surgical knee and created significant pocket of bruising superior to this in medial and posterior knee.  The thigh did not feel boggy or warm but was black and blue throughout.  Re-wrapped bandaging to provide proper protection and sent a message to MD as an FYI.  Encouraged Pt to send him a picture to be sure he did not have further concerns.  Pt continues to have expected level of knee pain and showed improved effort for SAQs with ball squeeze today.  Weight shifting into Rt LE improved with cue to stand tall through hip and knee to improve glute and quad activation in standing.  Pt will continue to benefit from skilled PT and ongoing observation of healing along POC for post-op Rt TKR.    Rehab Potential  Good    PT  Frequency  3x / week    PT Duration  8 weeks    PT Treatment/Interventions  ADLs/Self Care Home Management;Cryotherapy;Neuromuscular re-education;Therapeutic exercise;Therapeutic activities;Patient/family education;Manual techniques;Taping;Vasopneumatic Device;Electrical Stimulation;Moist Heat    PT Next Visit Plan  observe thigh bruising and dressing (see plan), gait, weight shifts, knee ROM, quad activation, Nu-Step, vaso for edema, update HEP as needed (calf stretch?)    PT Home Exercise Plan  Access Code: [email or web address]       Patient will benefit from skilled therapeutic intervention in order to improve the following deficits and impairments:     Visit Diagnosis: 1. Acute pain of right knee   2. Stiffness of right knee, not elsewhere classified   3. Muscle weakness (generalized)   4. Other abnormalities of gait and mobility   5. Localized edema        Problem List Patient Active Problem List   Diagnosis Date Noted  . Hypokalemia 06/27/2018  . Overweight (BMI 25.0-29.9) 06/27/2018  . S/P right TKA 06/26/2018  . Autoimmune disease ([place]) 03/15/2016  . High risk medication use 03/15/2016  . Pain in joint of right shoulder 03/15/2016  . Bilateral hip pain 03/15/2016  . Primary osteoarthritis of both knees 03/15/2016  . 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 Arthritis 02/24/2014  . HTN (hypertension) 02/24/2014  . Hyperlipidemia 02/24/2014  . Breast cancer, right, upper outer quadrant  07/05/2010    Baruch Merl, PT 07/02/18 6:55 PM   [facility] [address] [place], [place], [address] Phone: [phone]   Fax:  [phone]  Name: Yolanda Peters MRN: [id number] Date of Birth: 1940/03/01

## 2018-07-05 ENCOUNTER — Encounter: Payer: Self-pay | Admitting: Physical Therapy

## 2018-07-05 ENCOUNTER — Ambulatory Visit: Payer: Medicare Other | Admitting: Physical Therapy

## 2018-07-05 ENCOUNTER — Other Ambulatory Visit: Payer: Self-pay

## 2018-07-05 DIAGNOSIS — R2689 Other abnormalities of gait and mobility: Secondary | ICD-10-CM

## 2018-07-05 DIAGNOSIS — M25661 Stiffness of right knee, not elsewhere classified: Secondary | ICD-10-CM

## 2018-07-05 DIAGNOSIS — M25561 Pain in right knee: Secondary | ICD-10-CM | POA: Diagnosis not present

## 2018-07-05 DIAGNOSIS — M6281 Muscle weakness (generalized): Secondary | ICD-10-CM

## 2018-07-05 DIAGNOSIS — R600 Localized edema: Secondary | ICD-10-CM

## 2018-07-05 NOTE — Therapy (Signed)
[facility] [address] [place], [place], [address] Phone: [phone]   Fax:  [phone]  Physical Therapy Treatment  Patient Details  Name: Yolanda Peters MRN: [id number] Date of Birth: [birth year] Referring Provider (PT): Dr. Katherine Roan PA   Encounter Date: 07/05/2018  PT End of Session - 07/05/18 1631    Visit Number  4    Date for PT Re-Evaluation  08/23/18    PT Start Time  6962    PT Stop Time  1423    PT Time Calculation (min)  60 min    Activity Tolerance  Patient tolerated treatment well       Past Medical History:  Diagnosis Date  . Allergy   . Arthritis   . Breast cancer [facility]) 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  . Cancer ([place])    right breast   . Hyperlipidemia    takes Niacin  . Hypertension   . Personal history of radiation therapy 2011   rt breast    Past Surgical History:  Procedure Laterality Date  . BREAST DUCTAL SYSTEM EXCISION     right breast  . BREAST LUMPECTOMY    . BREAST SURGERY  august 2011   right partial mastectomy  . CATARACT EXTRACTION     bilateral  . CESAREAN SECTION    . ELBOW SURGERY     left  . MYOMECTOMY    . TOTAL KNEE ARTHROPLASTY Right 06/26/2018   Procedure: TOTAL KNEE ARTHROPLASTY;  Surgeon: Paralee Cancel, MD;  [facility]: [facility];  Service: Orthopedics;  Laterality: Right;  70 mins    There were no vitals filed for this visit.  Subjective Assessment - 07/05/18 1324    Subjective  My daughter called the doctor's office and they forgot to tell me to take the ACE bandage off.  It was too tight.  The bruising is a little better now.  I took a pain pill early this morning.    Pertinent History  Right TKR 06/26/18 hx of Breast Cancer    Currently in Pain?  Yes    Pain Score  5     Pain Location  Knee    Pain Orientation  Right    Pain Type  Surgical pain    Aggravating Factors   it hurts to sit and bend my knee         [facility] PT Assessment - 07/05/18 0001       Posture/Postural Control   Posture Comments  patient has purple bruising medial thigh, leg and anterior ankle;  moderate swelling;  surgical dressing over incision      AROM   Right Knee Extension  -15   seated   Right Knee Flexion  95   seated     Ambulation/Gait   Assistive device  Rolling walker    Gait Comments  ambulating with right LE internally rotated; able to correct with cues                   [facility] Adult PT Treatment/Exercise - 07/05/18 0001      Knee/Hip Exercises: Aerobic   Nustep  6' level 1, seat 10   PT present to discuss HEP and symptoms     Knee/Hip Exercises: Standing   SLS  WB on surgery leg with step taps 10x    SLS with Vectors  WB on surgery leg with opposite leg abduction 10x     Other Standing Knee Exercises  retro stepping 15x       Knee/Hip Exercises: Seated   Long Arc Quad  AROM;Right;10 reps    Long [employer] Limitations  with ankle pump to decrease edema     Sit to [employer]  10 reps   high table working on symmetrical weight bearing      Knee/Hip Exercises: Supine   Knee Flexion Limitations  green ball rolls for knee flexion x 15      Knee/Hip Exercises: Sidelying   Clams  10x   therapist assist to block trunk rotation      Vasopneumatic   Number Minutes Vasopneumatic   15 minutes    Vasopnuematic Location   Knee    Vasopneumatic Pressure  Low    Vasopneumatic Temperature   coldest setting      Manual Therapy   Manual therapy comments  supine hip abduction isometric with manual resist 10x    Passive ROM  knee flexion in sitting with gentle distraction 20x    Other Manual Therapy  HS sets with manual resistance in supine 10x             PT Education - 07/05/18 1430    Education Details  Access Code: [email or web address] clams    Person(s) Educated  Patient    Methods  Explanation;Demonstration;Handout    Comprehension  Returned demonstration;Verbalized understanding       PT Short Term Goals - 07/02/18 1803      PT SHORT  TERM GOAL #1   Title  be independent in initial HEP    Status  On-going      PT SHORT TERM GOAL #2   Title  Right knee extension improved to -5 degrees needed for improved standing and walking tolerance    Status  On-going      PT SHORT TERM GOAL #3   Title  Right knee flexion improved to 100 degrees for greater ease getting in/out of the car    Status  On-going        PT Long Term Goals - 06/28/18 1725      PT LONG TERM GOAL #1   Title  be independent in advanced HEP    Time  8    Period  Weeks    Status  New    Target Date  08/23/18      PT LONG TERM GOAL #2   Title  reduce FOTO to < or = to 27% limitation    Time  8    Period  Weeks    Status  New      PT LONG TERM GOAL #3   Title  Right knee ROM 3-112 degrees needed with sit to stand and negotiating curbs and stairs    Time  8    Period  Weeks    Status  New      PT LONG TERM GOAL #4   Title  The patient will have improved right LE strength to grossly 4/5 needed for walking community distances    Time  8    Period  Weeks    Status  On-going      PT LONG TERM GOAL #5   Title  The patient will have improved gait speed indicated with Timed up and Go < 20 sec    Time  8    Period  Weeks    Status  New      Additional Long Term Goals   Additional Long Term Goals  Yes  PT LONG TERM GOAL #6   Title  Decreased mid patella circumferential measurement right knee to 47 1/2 cm    Time  8    Period  Weeks    Status  New            Plan - 07/05/18 1425    Clinical Impression Statement  The patient has purple bruising medial right thigh and lower leg and ankle but much improved with in coloration from last visit.  Moderate edema in knee, ankle and lower leg.  She is ambulating with right LE internally rotated but able to correct with attention.  Added sidelying clam ex to HEP for strength of gluteals to address this weakness.  Quad lag persists but improving knee extension ROM.  Able to tolerate short bouts  of full weight bearing on right but lacks strength for symmetrical weight bearing with sit to stand.  The patient reports improved pain level following exercise.  Therapist closely monitoring response with all treatment interventions.    Rehab Potential  Good    PT Frequency  3x / week    PT Duration  8 weeks    PT Treatment/Interventions  ADLs/Self Care Home Management;Cryotherapy;Neuromuscular re-education;Therapeutic exercise;Therapeutic activities;Patient/family education;Manual techniques;Taping;Vasopneumatic Device;Electrical Stimulation;Moist Heat    PT Next Visit Plan  Knee ROM, gait, quad activation; manual techniques; vaso for edema;  sees MD on Tuesday    PT Home Exercise Plan  Access Code: [email or web address]       Patient will benefit from skilled therapeutic intervention in order to improve the following deficits and impairments:  Abnormal gait, Decreased range of motion, Difficulty walking, Decreased activity tolerance, Pain, Decreased strength, Decreased mobility, Increased edema  Visit Diagnosis: 1. Acute pain of right knee   2. Stiffness of right knee, not elsewhere classified   3. Muscle weakness (generalized)   4. Other abnormalities of gait and mobility   5. Localized edema        Problem List Patient Active Problem List   Diagnosis Date Noted  . Hypokalemia 06/27/2018  . Overweight (BMI 25.0-29.9) 06/27/2018  . S/P right TKA 06/26/2018  . Autoimmune disease ([place]) 03/15/2016  . High risk medication use 03/15/2016  . Pain in joint of right shoulder 03/15/2016  . Bilateral hip pain 03/15/2016  . Primary osteoarthritis of both knees 03/15/2016  . 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 Arthritis 02/24/2014  . HTN (hypertension) 02/24/2014  . Hyperlipidemia 02/24/2014  . Breast cancer, right, upper outer quadrant  07/05/2010   Ruben Im, PT 07/05/18 4:43 PM Phone: [phone] Fax: [phone] Alvera Singh 07/05/2018, 4:43 PM  [facility] [address], [place], [place], [address] Phone: [phone]   Fax:  [phone]  Name: Yolanda Peters MRN: [id number] Date of Birth: [birth year]

## 2018-07-05 NOTE — Patient Instructions (Signed)
Access Code: [email or web address]  URL: [email or web address]  Date: 07/05/2018  Prepared by: Ruben Im   Exercises  Backward Weight Shift and Opposite Arm Raise with Walker - 10 reps - 3 sets - 3x daily - 7x weekly  Seated Knee Extension Stretch with Chair - 1 reps - 1 sets - 60 hold - 3x daily - 7x weekly  Seated Knee Flexion Stretch - 10 reps - 3 sets - 3x daily - 7x weekly  Standing Heel Raise with Chair Support - 10 reps - 1 sets - 1x daily - 7x weekly  Standing Hip Flexion AROM - 10 reps - 3 sets - 1x daily - 7x weekly  Clamshell - 10 reps - 2 sets - 1x daily - 7x weekly

## 2018-07-06 ENCOUNTER — Encounter: Payer: Self-pay | Admitting: Physical Therapy

## 2018-07-06 ENCOUNTER — Ambulatory Visit: Payer: Medicare Other | Admitting: Physical Therapy

## 2018-07-06 ENCOUNTER — Other Ambulatory Visit: Payer: Self-pay

## 2018-07-06 DIAGNOSIS — M25561 Pain in right knee: Secondary | ICD-10-CM | POA: Diagnosis not present

## 2018-07-06 DIAGNOSIS — M6281 Muscle weakness (generalized): Secondary | ICD-10-CM

## 2018-07-06 DIAGNOSIS — R2689 Other abnormalities of gait and mobility: Secondary | ICD-10-CM

## 2018-07-06 DIAGNOSIS — M25661 Stiffness of right knee, not elsewhere classified: Secondary | ICD-10-CM

## 2018-07-06 DIAGNOSIS — R600 Localized edema: Secondary | ICD-10-CM

## 2018-07-06 NOTE — Therapy (Signed)
[facility] [address], [place], [place], [address] Phone: [phone]   Fax:  [phone]  Physical Therapy Treatment  Patient Details  Name: Yolanda Peters MRN: [id number] Date of Birth: 01-13-1940 Referring Provider (PT): Dr. Katherine Roan PA   Encounter Date: 07/06/2018  PT End of Session - 07/06/18 1146    Visit Number  5    Date for PT Re-Evaluation  08/23/18    PT Start Time  1104    PT Stop Time  1158    PT Time Calculation (min)  54 min    Activity Tolerance  Patient tolerated treatment well    Behavior During Therapy  [facility] for tasks assessed/performed       Past Medical History:  Diagnosis Date  . Allergy   . Arthritis   . Breast cancer [facility]) 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  . Cancer ([place])    right breast   . Hyperlipidemia    takes Niacin  . Hypertension   . Personal history of radiation therapy 2011   rt breast    Past Surgical History:  Procedure Laterality Date  . BREAST DUCTAL SYSTEM EXCISION     right breast  . BREAST LUMPECTOMY    . BREAST SURGERY  august 2011   right partial mastectomy  . CATARACT EXTRACTION     bilateral  . CESAREAN SECTION    . ELBOW SURGERY     left  . MYOMECTOMY    . TOTAL KNEE ARTHROPLASTY Right 06/26/2018   Procedure: TOTAL KNEE ARTHROPLASTY;  Surgeon: Paralee Cancel, MD;  Location: [facility];  Service: Orthopedics;  Laterality: Right;  70 mins    There were no vitals filed for this visit.  Subjective Assessment - 07/06/18 1108    Subjective  Trying to get off pain meds.  Haven't taken any today and have an ache in the knee.  Maybe a 4/10.    Pertinent History  Right TKR 06/26/18 hx of Breast Cancer    Limitations  Walking;House hold activities    How long can you sit comfortably?  no problem    How long can you walk comfortably?  house to car    Diagnostic tests  xray    Patient Stated Goals  drive a car again;  be walking normal and painfree    Currently  in Pain?  Yes    Pain Score  4     Pain Location  Knee    Pain Orientation  Right    Pain Descriptors / Indicators  Aching    Pain Type  Surgical pain    Pain Onset  1 to 4 weeks ago    Pain Frequency  Intermittent    Aggravating Factors   bending the knee    Pain Relieving Factors  ice                       [facility] Adult PT Treatment/Exercise - 07/06/18 0001      Ambulation/Gait   Ambulation Distance (Feet)  100 Feet    Assistive device  Rolling walker    Gait Comments  ambulating with right LE internally rotated; able to correct with cues, cued heel toe pattern      Exercises   Exercises  Knee/Hip      Knee/Hip Exercises: Aerobic   Nustep  6' level 2, seat 10      Knee/Hip Exercises: Seated   Long Arc [employer]  AROM;10 reps    Long Arc Quad Limitations  with 5 ankle pumps per LAQ      Knee/Hip Exercises: Supine   Straight Leg Raises  AROM;Right;20 reps   2 sets of 5, one set of 10, with PT support at heel and knee   Straight Leg Raises Limitations  add to HEP next visit if appropriate to perform without support by PT    Other Supine Knee/Hip Exercises  Rt LE straight leg press into mat table       Vasopneumatic   Number Minutes Vasopneumatic   15 minutes    Vasopnuematic Location   Knee    Vasopneumatic Pressure  Low    Vasopneumatic Temperature   34 degrees      Manual Therapy   Manual Therapy  Passive ROM;Soft tissue mobilization    Manual therapy comments  retrograde massage Rt foot and calf, semi-reclined    Passive ROM  knee flexion semi-reclined with 5 sec hold at end range x 10 reps, AA/ROM last 5 reps               PT Short Term Goals - 07/02/18 1803      PT SHORT TERM GOAL #1   Title  be independent in initial HEP    Status  On-going      PT SHORT TERM GOAL #2   Title  Right knee extension improved to -5 degrees needed for improved standing and walking tolerance    Status  On-going      PT SHORT TERM GOAL #3   Title  Right knee  flexion improved to 100 degrees for greater ease getting in/out of the car    Status  On-going        PT Long Term Goals - 06/28/18 1725      PT LONG TERM GOAL #1   Title  be independent in advanced HEP    Time  8    Period  Weeks    Status  New    Target Date  08/23/18      PT LONG TERM GOAL #2   Title  reduce FOTO to < or = to 27% limitation    Time  8    Period  Weeks    Status  New      PT LONG TERM GOAL #3   Title  Right knee ROM 3-112 degrees needed with sit to stand and negotiating curbs and stairs    Time  8    Period  Weeks    Status  New      PT LONG TERM GOAL #4   Title  The patient will have improved right LE strength to grossly 4/5 needed for walking community distances    Time  8    Period  Weeks    Status  On-going      PT LONG TERM GOAL #5   Title  The patient will have improved gait speed indicated with Timed up and Go < 20 sec    Time  8    Period  Weeks    Status  New      Additional Long Term Goals   Additional Long Term Goals  Yes      PT LONG TERM GOAL #6   Title  Decreased mid patella circumferential measurement right knee to 47 1/2 cm    Time  8    Period  Weeks    Status  New  Plan - 07/06/18 1147    Clinical Impression Statement  Continued improvement and drainage of Rt LE bruising.  PT focused on manual therapy techniques including P/ROM for knee flexion and retrograde massage for edema control.  Pt continues to be able to display improved gait pattern with reminders for heel toe pattern and preventing IR of Rt LE.  She was able to tolerate and perform SLRs today with support only (vs. assist) by PT to help with knee pain which was eliminated with manual support at heel and behind knee.  Formally adding SLRs to HEP next visit may be appropriate.  Pt to see MD next week on Tues.  She will continue to benefit from skilled PT along POC for post-op Rt TKR.    Rehab Potential  Good    PT Frequency  3x / week    PT Duration  8  weeks    PT Treatment/Interventions  ADLs/Self Care Home Management;Cryotherapy;Neuromuscular re-education;Therapeutic exercise;Therapeutic activities;Patient/family education;Manual techniques;Taping;Vasopneumatic Device;Electrical Stimulation;Moist Heat    PT Next Visit Plan  take measurements and check goals for MD visit on Tues (route note), check SLR and see if ready for HEP, ROM to improve knee flexion, gait, WB through Rt LE, edema control techniques, vaso    PT Home Exercise Plan  Access Code: [email or web address]    Consulted and Agree with Plan of Care  Patient       Patient will benefit from skilled therapeutic intervention in order to improve the following deficits and impairments:     Visit Diagnosis: 1. Acute pain of right knee   2. Stiffness of right knee, not elsewhere classified   3. Muscle weakness (generalized)   4. Other abnormalities of gait and mobility   5. Localized edema        Problem List Patient Active Problem List   Diagnosis Date Noted  . Hypokalemia 06/27/2018  . Overweight (BMI 25.0-29.9) 06/27/2018  . S/P right TKA 06/26/2018  . Autoimmune disease ([place]) 03/15/2016  . High risk medication use 03/15/2016  . Pain in joint of right shoulder 03/15/2016  . Bilateral hip pain 03/15/2016  . Primary osteoarthritis of both knees 03/15/2016  . 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 Arthritis 02/24/2014  . HTN (hypertension) 02/24/2014  . Hyperlipidemia 02/24/2014  . Breast cancer, right, upper outer quadrant  07/05/2010    Baruch Merl, PT 07/06/18 11:55 AM    [facility] [address] [place], [place], [address] Phone: [phone]   Fax:  [phone]  Name: LADELL BEY MRN: [id number] Date of Birth: 1940/08/08

## 2018-07-09 ENCOUNTER — Ambulatory Visit: Payer: Medicare Other

## 2018-07-09 ENCOUNTER — Other Ambulatory Visit: Payer: Self-pay

## 2018-07-09 DIAGNOSIS — R600 Localized edema: Secondary | ICD-10-CM

## 2018-07-09 DIAGNOSIS — M6281 Muscle weakness (generalized): Secondary | ICD-10-CM

## 2018-07-09 DIAGNOSIS — M25561 Pain in right knee: Secondary | ICD-10-CM | POA: Diagnosis not present

## 2018-07-09 DIAGNOSIS — R2689 Other abnormalities of gait and mobility: Secondary | ICD-10-CM

## 2018-07-09 DIAGNOSIS — M25661 Stiffness of right knee, not elsewhere classified: Secondary | ICD-10-CM

## 2018-07-09 NOTE — Therapy (Signed)
[facility] [address], [place], [place], [address] Phone: [phone]   Fax:  [phone]  Physical Therapy Treatment  Patient Details  Name: Yolanda Peters MRN: [id number] Date of Birth: [birth year] Referring Provider (PT): Dr. Katherine Roan PA   Encounter Date: 07/09/2018  PT End of Session - 07/09/18 1125    Visit Number  6    Date for PT Re-Evaluation  08/23/18    PT Start Time  1031    PT Stop Time  1133    PT Time Calculation (min)  62 min    Activity Tolerance  Patient tolerated treatment well    Behavior During Therapy  [facility] for tasks assessed/performed       Past Medical History:  Diagnosis Date  . Allergy   . Arthritis   . Breast cancer [facility]) 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  . Cancer ([place])    right breast   . Hyperlipidemia    takes Niacin  . Hypertension   . Personal history of radiation therapy 2011   rt breast    Past Surgical History:  Procedure Laterality Date  . BREAST DUCTAL SYSTEM EXCISION     right breast  . BREAST LUMPECTOMY    . BREAST SURGERY  august 2011   right partial mastectomy  . CATARACT EXTRACTION     bilateral  . CESAREAN SECTION    . ELBOW SURGERY     left  . MYOMECTOMY    . TOTAL KNEE ARTHROPLASTY Right 06/26/2018   Procedure: TOTAL KNEE ARTHROPLASTY;  Surgeon: Paralee Cancel, MD;  Location: [facility];  Service: Orthopedics;  Laterality: Right;  70 mins    There were no vitals filed for this visit.  Subjective Assessment - 07/09/18 1026    Subjective  I am just so swollen    Pertinent History  Right TKR 06/26/18 hx of Breast Cancer    Currently in Pain?  Yes    Pain Score  4     Pain Location  Knee    Pain Orientation  Right    Pain Descriptors / Indicators  Aching    Pain Type  Surgical pain    Pain Onset  1 to 4 weeks ago    Pain Frequency  Intermittent    Aggravating Factors   bending the knee    Pain Relieving Factors  ice, rest                        [facility] Adult PT Treatment/Exercise - 07/09/18 0001      Knee/Hip Exercises: Aerobic   Nustep  8' level 2, seat 10   PT present to discuss progress     Knee/Hip Exercises: Seated   Long Arc Quad  AROM;10 reps    Long [employer] Limitations  with 5 ankle pumps per [employer]      Knee/Hip Exercises: Supine   [employer]  Strengthening;Right;20 reps    [employer] Limitations  difficulty isolating quad    Short Arc [employer]  Strengthening;20 reps    Straight Leg Raises  AROM;Right;20 reps   2 sets of 5, one set of 10, with PT support at heel and knee   Straight Leg Raises Limitations  --    Other Supine Knee/Hip Exercises  Rt LE straight leg press into mat table       Vasopneumatic   Number Minutes Vasopneumatic   15 minutes  Vasopnuematic Location   Knee    Vasopneumatic Pressure  Medium    Vasopneumatic Temperature   34 degrees      Manual Therapy   Manual Therapy  Passive ROM;Soft tissue mobilization    Manual therapy comments  retrograde massage Rt foot and calf, semi-reclined    Passive ROM  --             PT Education - 07/09/18 1123    Education Details  review of all HEP issued at hospital.  PT emphasized the importance of compliance with HEP    Person(s) Educated  Patient    Methods  Explanation    Comprehension  Verbalized understanding       PT Short Term Goals - 07/02/18 1803      PT SHORT TERM GOAL #1   Title  be independent in initial HEP    Status  On-going      PT SHORT TERM GOAL #2   Title  Right knee extension improved to -5 degrees needed for improved standing and walking tolerance    Status  On-going      PT SHORT TERM GOAL #3   Title  Right knee flexion improved to 100 degrees for greater ease getting in/out of the car    Status  On-going        PT Long Term Goals - 06/28/18 1725      PT LONG TERM GOAL #1   Title  be independent in advanced HEP    Time  8    Period  Weeks    Status  New    Target Date   08/23/18      PT LONG TERM GOAL #2   Title  reduce FOTO to < or = to 27% limitation    Time  8    Period  Weeks    Status  New      PT LONG TERM GOAL #3   Title  Right knee ROM 3-112 degrees needed with sit to stand and negotiating curbs and stairs    Time  8    Period  Weeks    Status  New      PT LONG TERM GOAL #4   Title  The patient will have improved right LE strength to grossly 4/5 needed for walking community distances    Time  8    Period  Weeks    Status  On-going      PT LONG TERM GOAL #5   Title  The patient will have improved gait speed indicated with Timed up and Go < 20 sec    Time  8    Period  Weeks    Status  New      Additional Long Term Goals   Additional Long Term Goals  Yes      PT LONG TERM GOAL #6   Title  Decreased mid patella circumferential measurement right knee to 47 1/2 cm    Time  8    Period  Weeks    Status  New            Plan - 07/09/18 1045    Clinical Impression Statement  Pt continues to work on gait, Rt knee A/ROM, Rt LE strength and edema/pain management 2 weeks s/p TKA.  Pt with significant bruising and edema s/p surgery and is working to do ankle pumps, quad sets and elevation/ice to manage edema. Pt demonstrates poor quad set with towel roll under  knee.  Pt substitutes with gluteals and requires tactile cues for quad activation.   Pt requires tactile cueing to reduce pelvic elevation with seated heel slides. Pt continues to ambulate all distances with a rolling walker and demonstrates substitution with sitting due to lack of Rt knee A/ROM flexion.  Pt will continue to benefit from skilled PT for Rt knee strength, flexibility, edema management and gait training.    PT Frequency  3x / week    PT Duration  8 weeks    PT Treatment/Interventions  ADLs/Self Care Home Management;Cryotherapy;Neuromuscular re-education;Therapeutic exercise;Therapeutic activities;Patient/family education;Manual techniques;Taping;Vasopneumatic  Device;Electrical Stimulation;Moist Heat    PT Next Visit Plan  take measurements and send note to MD.  Appt is 07/11/18.    PT Home Exercise Plan  Access Code: [email or web address]    Consulted and Agree with Plan of Care  Patient       Patient will benefit from skilled therapeutic intervention in order to improve the following deficits and impairments:  Abnormal gait, Decreased range of motion, Difficulty walking, Decreased activity tolerance, Pain, Decreased strength, Decreased mobility, Increased edema  Visit Diagnosis: 1. Stiffness of right knee, not elsewhere classified   2. Acute pain of right knee   3. Muscle weakness (generalized)   4. Other abnormalities of gait and mobility   5. Localized edema        Problem List Patient Active Problem List   Diagnosis Date Noted  . Hypokalemia 06/27/2018  . Overweight (BMI 25.0-29.9) 06/27/2018  . S/P right TKA 06/26/2018  . Autoimmune disease ([place]) 03/15/2016  . High risk medication use 03/15/2016  . Pain in joint of right shoulder 03/15/2016  . Bilateral hip pain 03/15/2016  . Primary osteoarthritis of both knees 03/15/2016  . 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 Arthritis 02/24/2014  . HTN (hypertension) 02/24/2014  . Hyperlipidemia 02/24/2014  . Breast cancer, right, upper outer quadrant  07/05/2010   Sigurd Sos, PT 07/09/18 11:26 AM  [facility] [address] [place], [place], [address] Phone: [phone]   Fax:  [phone]  Name: Yolanda Peters MRN: [id number] Date of Birth: 28-Jul-1940

## 2018-07-10 ENCOUNTER — Ambulatory Visit: Payer: Medicare Other | Admitting: Physical Therapy

## 2018-07-10 ENCOUNTER — Other Ambulatory Visit: Payer: Self-pay

## 2018-07-10 ENCOUNTER — Encounter: Payer: Self-pay | Admitting: Physical Therapy

## 2018-07-10 DIAGNOSIS — M25561 Pain in right knee: Secondary | ICD-10-CM | POA: Diagnosis not present

## 2018-07-10 DIAGNOSIS — R2689 Other abnormalities of gait and mobility: Secondary | ICD-10-CM

## 2018-07-10 DIAGNOSIS — M25661 Stiffness of right knee, not elsewhere classified: Secondary | ICD-10-CM

## 2018-07-10 DIAGNOSIS — M6281 Muscle weakness (generalized): Secondary | ICD-10-CM

## 2018-07-10 DIAGNOSIS — R600 Localized edema: Secondary | ICD-10-CM

## 2018-07-10 NOTE — Therapy (Signed)
[facility] [address] [place], [place], [address] Phone: [phone]   Fax:  [phone]  Physical Therapy Treatment  Patient Details  Name: Yolanda Peters MRN: [id number] Date of Birth: Feb 07, 1940 Referring Provider (PT): Dr. Katherine Roan PA   Encounter Date: 07/10/2018  PT End of Session - 07/10/18 1414    Visit Number  7    Date for PT Re-Evaluation  08/23/18    PT Start Time  1331    PT Stop Time  1420    PT Time Calculation (min)  49 min    Activity Tolerance  Patient tolerated treatment well       Past Medical History:  Diagnosis Date  . Allergy   . Arthritis   . Breast cancer [facility]) 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  . Cancer ([place])    right breast   . Hyperlipidemia    takes Niacin  . Hypertension   . Personal history of radiation therapy 2011   rt breast    Past Surgical History:  Procedure Laterality Date  . BREAST DUCTAL SYSTEM EXCISION     right breast  . BREAST LUMPECTOMY    . BREAST SURGERY  august 2011   right partial mastectomy  . CATARACT EXTRACTION     bilateral  . CESAREAN SECTION    . ELBOW SURGERY     left  . MYOMECTOMY    . TOTAL KNEE ARTHROPLASTY Right 06/26/2018   Procedure: TOTAL KNEE ARTHROPLASTY;  Surgeon: Paralee Cancel, MD;  Location: [facility];  Service: Orthopedics;  Laterality: Right;  70 mins    There were no vitals filed for this visit.  Subjective Assessment - 07/10/18 1333    Subjective  My knee is sore.  I wish the swelling would go away so it would be more flexible.  I did my exercises twice today in the bed.  Bandage covering incision.     Pertinent History  Right TKR 06/26/18 hx of Breast Cancer    Currently in Pain?  Yes    Pain Score  4     Pain Location  Knee    Pain Orientation  Right    Pain Type  Surgical pain         [facility] PT Assessment - 07/10/18 0001      Observation/Other Assessments-Edema    Edema  --   redness and pitting edema right lower  leg and peripatellar     AROM   Right Knee Extension  -10   seated   Right Knee Flexion  95   seated     Strength   Overall Strength Comments  able to do 10x SLR independently but with quad lag    Right Knee Flexion  3/5    Right Knee Extension  3-/5                   [facility] Adult PT Treatment/Exercise - 07/10/18 0001      Knee/Hip Exercises: Stretches   Other Knee/Hip Stretches  1st step rocks for knee flexion 10x       Knee/Hip Exercises: Aerobic   Nustep  8' level 2, seat 10   PT present to discuss progress     Knee/Hip Exercises: Standing   SLS  WB on surgery leg with step taps 10x    Other Standing Knee Exercises  retro stepping 15x     Other Standing Knee Exercises  gluteal squeeze with sliding double  and single arms up wall 2x10      Knee/Hip Exercises: Seated   Heel Slides  Right;15 reps    Heel Slides Limitations  floor slider    Other Seated Knee/Hip Exercises  quad sets digging heels into floor 10x    Sit to Sand  --   black foam on mat table 10x     Knee/Hip Exercises: Supine   Straight Leg Raises  Strengthening;Right;10 reps    Knee Flexion Limitations  green ball rolls for knee flexion x 15    Other Supine Knee/Hip Exercises  ankle pumps with LEs elevated on green ball 2x10    Other Supine Knee/Hip Exercises  HS sets on green ball 2x 10      Vasopneumatic   Number Minutes Vasopneumatic   15 minutes    Vasopnuematic Location   Knee   pt encouraged to do ankle pumps and quad sets every minute   Vasopneumatic Pressure  Medium    Vasopneumatic Temperature   34 degrees      Ambulation   Ambulation/Gait Assistance Details  --   gait with RW independent for short distances               PT Short Term Goals - 07/02/18 1803      PT SHORT TERM GOAL #1   Title  be independent in initial HEP    Status  On-going      PT SHORT TERM GOAL #2   Title  Right knee extension improved to -5 degrees needed for improved standing and walking  tolerance    Status  On-going      PT SHORT TERM GOAL #3   Title  Right knee flexion improved to 100 degrees for greater ease getting in/out of the car    Status  On-going        PT Long Term Goals - 06/28/18 1725      PT LONG TERM GOAL #1   Title  be independent in advanced HEP    Time  8    Period  Weeks    Status  New    Target Date  08/23/18      PT LONG TERM GOAL #2   Title  reduce FOTO to < or = to 27% limitation    Time  8    Period  Weeks    Status  New      PT LONG TERM GOAL #3   Title  Right knee ROM 3-112 degrees needed with sit to stand and negotiating curbs and stairs    Time  8    Period  Weeks    Status  New      PT LONG TERM GOAL #4   Title  The patient will have improved right LE strength to grossly 4/5 needed for walking community distances    Time  8    Period  Weeks    Status  On-going      PT LONG TERM GOAL #5   Title  The patient will have improved gait speed indicated with Timed up and Go < 20 sec    Time  8    Period  Weeks    Status  New      Additional Long Term Goals   Additional Long Term Goals  Yes      PT LONG TERM GOAL #6   Title  Decreased mid patella circumferential measurement right knee to 47 1/2 cm    Time  [place] - 07/10/18 1414    Clinical Impression Statement  The patient has had an increase in right lower leg swelling noted yesterday.  She has been encouraged to continue muscle pumping, elevation and ice on a frequent basis and recommended she discuss with the doctor tomorrow regarding her TED hose.  Knee ROM and quad activation inhibited by edema:  10-95 degrees in sitting.  +quad lag with SLR.  Therapist providing verbal and tactile cues for quad activation and providing close supervision for safety with standing exercises.    Rehab Potential  Good    PT Frequency  3x / week    PT Duration  8 weeks    PT Treatment/Interventions  ADLs/Self Care Home  Management;Cryotherapy;Neuromuscular re-education;Therapeutic exercise;Therapeutic activities;Patient/family education;Manual techniques;Taping;Vasopneumatic Device;Electrical Stimulation;Moist Heat    PT Next Visit Plan  see how 7/1 MD appt went;  right knee ROM, strengthening, proprioception, vasocompression       Patient will benefit from skilled therapeutic intervention in order to improve the following deficits and impairments:  Abnormal gait, Decreased range of motion, Difficulty walking, Decreased activity tolerance, Pain, Decreased strength, Decreased mobility, Increased edema  Visit Diagnosis: 1. Stiffness of right knee, not elsewhere classified   2. Acute pain of right knee   3. Muscle weakness (generalized)   4. Other abnormalities of gait and mobility   5. Localized edema        Problem List Patient Active Problem List   Diagnosis Date Noted  . Hypokalemia 06/27/2018  . Overweight (BMI 25.0-29.9) 06/27/2018  . S/P right TKA 06/26/2018  . Autoimmune disease ([place]) 03/15/2016  . High risk medication use 03/15/2016  . Pain in joint of right shoulder 03/15/2016  . Bilateral hip pain 03/15/2016  . Primary osteoarthritis of both knees 03/15/2016  . 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 Arthritis 02/24/2014  . HTN (hypertension) 02/24/2014  . Hyperlipidemia 02/24/2014  . Breast cancer, right, upper outer quadrant  07/05/2010   Ruben Im, PT 07/10/18 2:27 PM Phone: [phone] Fax: [phone]  Alvera Singh 07/10/2018, 2:22 PM  [facility] [address] [place], [place], [address] Phone: [phone]   Fax:  [phone]  Name: Yolanda Peters MRN: [id number] Date of Birth: 10/25/1940

## 2018-07-12 ENCOUNTER — Other Ambulatory Visit: Payer: Self-pay

## 2018-07-12 ENCOUNTER — Ambulatory Visit: Payer: Medicare Other | Attending: Orthopedic Surgery | Admitting: Physical Therapy

## 2018-07-12 ENCOUNTER — Encounter: Payer: Self-pay | Admitting: Physical Therapy

## 2018-07-12 DIAGNOSIS — R2689 Other abnormalities of gait and mobility: Secondary | ICD-10-CM | POA: Diagnosis present

## 2018-07-12 DIAGNOSIS — M6281 Muscle weakness (generalized): Secondary | ICD-10-CM

## 2018-07-12 DIAGNOSIS — M25661 Stiffness of right knee, not elsewhere classified: Secondary | ICD-10-CM | POA: Diagnosis present

## 2018-07-12 DIAGNOSIS — R600 Localized edema: Secondary | ICD-10-CM

## 2018-07-12 DIAGNOSIS — M25561 Pain in right knee: Secondary | ICD-10-CM | POA: Diagnosis present

## 2018-07-12 NOTE — Therapy (Signed)
[facility] [address] [place], [place], [address] Phone: [phone]   Fax:  [phone]  Physical Therapy Treatment  Patient Details  Name: Yolanda Peters MRN: [id number] Date of Birth: 1940/06/16 Referring Provider (PT): Dr. Katherine Roan PA   Encounter Date: 07/12/2018  PT End of Session - 07/12/18 1244    Visit Number  8    Date for PT Re-Evaluation  08/23/18    PT Start Time  1232    PT Stop Time  1330    PT Time Calculation (min)  58 min    Activity Tolerance  Patient tolerated treatment well       Past Medical History:  Diagnosis Date  . Allergy   . Arthritis   . Breast cancer [facility]) 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  . Cancer ([place])    right breast   . Hyperlipidemia    takes Niacin  . Hypertension   . Personal history of radiation therapy 2011   rt breast    Past Surgical History:  Procedure Laterality Date  . BREAST DUCTAL SYSTEM EXCISION     right breast  . BREAST LUMPECTOMY    . BREAST SURGERY  august 2011   right partial mastectomy  . CATARACT EXTRACTION     bilateral  . CESAREAN SECTION    . ELBOW SURGERY     left  . MYOMECTOMY    . TOTAL KNEE ARTHROPLASTY Right 06/26/2018   Procedure: TOTAL KNEE ARTHROPLASTY;  Surgeon: Paralee Cancel, MD;  Location: [facility];  Service: Orthopedics;  Laterality: Right;  70 mins    There were no vitals filed for this visit.  Subjective Assessment - 07/12/18 1236    Subjective  The doctor said TED hose were optional.  Patient presents wearing knee-high hose.  Discussing watching to make sure it does not make a tourniquet effect.  The doctor wants me to have 120 degrees by the time I go back in 4 weeks.  I only take pain pills at night. The doctor said that if the redness moves up closer to my knee to call for an antibiotic.    Pertinent History  Right TKR 06/26/18 hx of Breast Cancer    Patient Stated Goals  drive a car again;  be walking normal and painfree     Currently in Pain?  Yes    Pain Score  3     Pain Location  Knee    Pain Orientation  Right    Pain Descriptors / Indicators  Aching    Pain Type  Surgical pain                       [facility] Adult PT Treatment/Exercise - 07/12/18 0001      Knee/Hip Exercises: Stretches   Passive Hamstring Stretch Limitations  on 2nd step 5x    Other Knee/Hip Stretches  2nd step rocks 10x      Knee/Hip Exercises: Aerobic   Nustep  8' level 2, seat 10   PT present to discuss progress     Knee/Hip Exercises: Standing   Forward Step Up  Right;10 reps;Hand Hold: 2;Step Height: 2"    Forward Step Up Limitations  moderate UE use on railings    SLS  WB on surgery leg with 1st and 2nd step taps 10x    Other Standing Knee Exercises  retro stepping 25x       Knee/Hip Exercises: Seated  Long Arc [employer]  AROM;10 reps    Sit to [employer]  15 reps   tall table cues and target reaching to inc WB on right     Knee/Hip Exercises: Supine   Quad Sets  10 reps    Knee Flexion Limitations  green ball rolls for knee flexion x 15    Other Supine Knee/Hip Exercises  ankle pumps with LEs elevated on green ball 2x10    Other Supine Knee/Hip Exercises  HS sets on green ball 2x 10      Vasopneumatic   Number Minutes Vasopneumatic   15 minutes   pillow case covering incision   Vasopnuematic Location   Knee   pt encouraged to do ankle pumps and quad sets every minute   Vasopneumatic Pressure  Medium    Vasopneumatic Temperature   34 degrees      Manual Therapy   Manual therapy comments  retrograde massage Rt foot and calf;  LEs elevated    Passive ROM  knee flexion in sitting with gentle distraction 20x;  supine flexion 20x     Other Manual Therapy  HS sets with manual resistance in supine 10x               PT Short Term Goals - 07/02/18 1803      PT SHORT TERM GOAL #1   Title  be independent in initial HEP    Status  On-going      PT SHORT TERM GOAL #2   Title  Right knee extension  improved to -5 degrees needed for improved standing and walking tolerance    Status  On-going      PT SHORT TERM GOAL #3   Title  Right knee flexion improved to 100 degrees for greater ease getting in/out of the car    Status  On-going        PT Long Term Goals - 06/28/18 1725      PT LONG TERM GOAL #1   Title  be independent in advanced HEP    Time  8    Period  Weeks    Status  New    Target Date  08/23/18      PT LONG TERM GOAL #2   Title  reduce FOTO to < or = to 27% limitation    Time  8    Period  Weeks    Status  New      PT LONG TERM GOAL #3   Title  Right knee ROM 3-112 degrees needed with sit to stand and negotiating curbs and stairs    Time  8    Period  Weeks    Status  New      PT LONG TERM GOAL #4   Title  The patient will have improved right LE strength to grossly 4/5 needed for walking community distances    Time  8    Period  Weeks    Status  On-going      PT LONG TERM GOAL #5   Title  The patient will have improved gait speed indicated with Timed up and Go < 20 sec    Time  8    Period  Weeks    Status  New      Additional Long Term Goals   Additional Long Term Goals  Yes      PT LONG TERM GOAL #6   Title  Decreased mid patella circumferential measurement right knee to 47 1/2  cm    Time  8    Period  Weeks    Status  New            Plan - 07/12/18 1321    Clinical Impression Statement  Patient returns to PT after follow up with the doctor.  Surgical dressing has been removed.  No drainage.  Lower leg with redness and swelling persists but improved in appearance from earlier in the week.  Patient cautioned to check her knee high TED hose that it does not roll up and cause blood flow restriction.  Improving knee ROM and lateral quad activation although medial quad activation limited.  Verbal cues during gait and with weight bearing ex's for toe forward rather than internal rotation.  Improved pain intensity and edema with vascompression.   Encouraged regular exercise and elevation at home.    Rehab Potential  Good    PT Frequency  3x / week    PT Duration  8 weeks    PT Treatment/Interventions  ADLs/Self Care Home Management;Cryotherapy;Neuromuscular re-education;Therapeutic exercise;Therapeutic activities;Patient/family education;Manual techniques;Taping;Vasopneumatic Device;Electrical Stimulation;Moist Heat    PT Next Visit Plan  progressive right knee ROM, strengthening, proprioception, vasocompression    PT Home Exercise Plan  Access Code: [email or web address]       Patient will benefit from skilled therapeutic intervention in order to improve the following deficits and impairments:  Abnormal gait, Decreased range of motion, Difficulty walking, Decreased activity tolerance, Pain, Decreased strength, Decreased mobility, Increased edema  Visit Diagnosis: 1. Stiffness of right knee, not elsewhere classified   2. Acute pain of right knee   3. Muscle weakness (generalized)   4. Other abnormalities of gait and mobility   5. Localized edema        Problem List Patient Active Problem List   Diagnosis Date Noted  . Hypokalemia 06/27/2018  . Overweight (BMI 25.0-29.9) 06/27/2018  . S/P right TKA 06/26/2018  . Autoimmune disease ([place]) 03/15/2016  . High risk medication use 03/15/2016  . Pain in joint of right shoulder 03/15/2016  . Bilateral hip pain 03/15/2016  . Primary osteoarthritis of both knees 03/15/2016  . 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 Arthritis 02/24/2014  . HTN (hypertension) 02/24/2014  . Hyperlipidemia 02/24/2014  . Breast cancer, right, upper outer quadrant  07/05/2010   Ruben Im, PT 07/12/18 1:32 PM Phone: [phone] Fax: [phone]  Alvera Singh 07/12/2018, 1:29 PM  [facility] [address] [place], [place], [address] Phone: [phone]   Fax:  [phone]  Name: Yolanda Peters MRN: [id number] Date of Birth:  1940/01/16

## 2018-07-16 ENCOUNTER — Ambulatory Visit: Payer: Medicare Other | Admitting: Physical Therapy

## 2018-07-16 ENCOUNTER — Other Ambulatory Visit: Payer: Self-pay

## 2018-07-16 DIAGNOSIS — M6281 Muscle weakness (generalized): Secondary | ICD-10-CM

## 2018-07-16 DIAGNOSIS — R2689 Other abnormalities of gait and mobility: Secondary | ICD-10-CM

## 2018-07-16 DIAGNOSIS — M25661 Stiffness of right knee, not elsewhere classified: Secondary | ICD-10-CM

## 2018-07-16 DIAGNOSIS — R600 Localized edema: Secondary | ICD-10-CM

## 2018-07-16 DIAGNOSIS — M25561 Pain in right knee: Secondary | ICD-10-CM

## 2018-07-16 NOTE — Therapy (Signed)
[facility] [address] [place], [place], [address] Phone: [phone]   Fax:  [phone]  Physical Therapy Treatment  Patient Details  Name: Yolanda Peters MRN: [id number] Date of Birth: 08/04/1940 Referring Provider (PT): Dr. Katherine Roan PA   Encounter Date: 07/16/2018  PT End of Session - 07/16/18 1345    Visit Number  9    Date for PT Re-Evaluation  08/23/18    PT Start Time  1301    PT Stop Time  1400    PT Time Calculation (min)  59 min    Activity Tolerance  Patient tolerated treatment well    Behavior During Therapy  [facility] for tasks assessed/performed       Past Medical History:  Diagnosis Date  . Allergy   . Arthritis   . Breast cancer [facility]) 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  . Cancer ([place])    right breast   . Hyperlipidemia    takes Niacin  . Hypertension   . Personal history of radiation therapy 2011   rt breast    Past Surgical History:  Procedure Laterality Date  . BREAST DUCTAL SYSTEM EXCISION     right breast  . BREAST LUMPECTOMY    . BREAST SURGERY  august 2011   right partial mastectomy  . CATARACT EXTRACTION     bilateral  . CESAREAN SECTION    . ELBOW SURGERY     left  . MYOMECTOMY    . TOTAL KNEE ARTHROPLASTY Right 06/26/2018   Procedure: TOTAL KNEE ARTHROPLASTY;  Surgeon: Paralee Cancel, MD;  [facility];  Service: Orthopedics;  Laterality: Right;  70 mins    There were no vitals filed for this visit.      [facility] PT Assessment - 07/16/18 0001      AROM   Right Knee Flexion  105   Supine post session                  [facility] Adult PT Treatment/Exercise - 07/16/18 0001      Ambulation/Gait   Gait Comments  30 feet 2x with RW with VC to use walker very little and trust her leg more. Vc to relax her shoulders.       Knee/Hip Exercises: Stretches   Knee: Self-Stretch to increase Flexion  --   20x ROM, 5x hold 20 sec at end range onstep     Knee/Hip  Exercises: Standing   Rebounder  weightshifting 3 ways 1 min each light UE      Knee/Hip Exercises: Seated   Sit to Sand  15 reps;without UE support   Sitting on black foam, VC for > wb in RTLE     Knee/Hip Exercises: Supine   Heel Slides  AAROM;Right;1 set;20 reps      Vasopneumatic   Number Minutes Vasopneumatic   15 minutes   pillow case covering incision   Vasopnuematic Location   Knee   pt encouraged to do ankle pumps and quad sets every minute   Vasopneumatic Pressure  Medium    Vasopneumatic Temperature   3 flakes      Manual Therapy   Manual therapy comments  retrograde massage Rt foot and calf;  LEs elevated    Passive ROM  knee flexion in sitting with gentle distraction 20x;  supine flexion 20x                PT Short Term Goals - 07/16/18 1349  PT SHORT TERM GOAL #1   Title  be independent in initial HEP    Time  4    Period  Weeks    Status  Achieved      PT SHORT TERM GOAL #3   Title  Right knee flexion improved to 100 degrees for greater ease getting in/out of the car    Time  4    Period  Weeks    Status  Achieved        PT Long Term Goals - 06/28/18 1725      PT LONG TERM GOAL #1   Title  be independent in advanced HEP    Time  8    Period  Weeks    Status  New    Target Date  08/23/18      PT LONG TERM GOAL #2   Title  reduce FOTO to < or = to 27% limitation    Time  8    Period  Weeks    Status  New      PT LONG TERM GOAL #3   Title  Right knee ROM 3-112 degrees needed with sit to stand and negotiating curbs and stairs    Time  8    Period  Weeks    Status  New      PT LONG TERM GOAL #4   Title  The patient will have improved right LE strength to grossly 4/5 needed for walking community distances    Time  8    Period  Weeks    Status  On-going      PT LONG TERM GOAL #5   Title  The patient will have improved gait speed indicated with Timed up and Go < 20 sec    Time  8    Period  Weeks    Status  New       Additional Long Term Goals   Additional Long Term Goals  Yes      PT LONG TERM GOAL #6   Title  Decreased mid patella circumferential measurement right knee to 47 1/2 cm    Time  8    Period  Weeks    Status  New            Plan - 07/16/18 1345    Clinical Impression Statement  Pt presents with essentially no pain, just tightness. Pt RTLE remains swollen and stiff. Retrograde massage and myofascial work to distal leg to increase mobility of skin and surrounding soft tissues. Pt increased her flexion RO to 105 degrees post session. Encouraged pt to ambulate with more confidence in her RTLE: use her UE less. She could demonstrate this.    Personal Factors and Comorbidities  Age    Examination-Activity Limitations  Bathing;Bed Mobility;Stairs;Locomotion Level;Lift;Stand    Examination-Participation Restrictions  Community Activity;Driving;Shop;Volunteer    Stability/Clinical Decision Making  Stable/Uncomplicated    Rehab Potential  Good    PT Frequency  3x / week    PT Duration  8 weeks    PT Treatment/Interventions  ADLs/Self Care Home Management;Cryotherapy;Neuromuscular re-education;Therapeutic exercise;Therapeutic activities;Patient/family education;Manual techniques;Taping;Vasopneumatic Device;Electrical Stimulation;Moist Heat    PT Next Visit Plan  progressive right knee ROM, strengthening, proprioception, vasocompression    PT Home Exercise Plan  Access Code: [email or web address]    Consulted and Agree with Plan of Care  Patient       Patient will benefit from skilled therapeutic intervention in order to improve the following deficits and impairments:  Abnormal gait, Decreased range of motion, Difficulty walking, Decreased activity tolerance, Pain, Decreased strength, Decreased mobility, Increased edema  Visit Diagnosis: 1. Stiffness of right knee, not elsewhere classified   2. Acute pain of right knee   3. Muscle weakness (generalized)   4. Other abnormalities of gait and mobility    5. Localized edema        Problem List Patient Active Problem List   Diagnosis Date Noted  . Hypokalemia 06/27/2018  . Overweight (BMI 25.0-29.9) 06/27/2018  . S/P right TKA 06/26/2018  . Autoimmune disease ([place]) 03/15/2016  . High risk medication use 03/15/2016  . Pain in joint of right shoulder 03/15/2016  . Bilateral hip pain 03/15/2016  . Primary osteoarthritis of both knees 03/15/2016  . 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 Arthritis 02/24/2014  . HTN (hypertension) 02/24/2014  . Hyperlipidemia 02/24/2014  . Breast cancer, right, upper outer quadrant  07/05/2010    Aikam Vinje, PTA 07/16/2018, 1:52 PM  [facility] [address], [place], [place], [address] Phone: [phone]   Fax:  [phone]  Name: KYIA RHUDE MRN: [id number] Date of Birth: February 01, 1940

## 2018-07-18 ENCOUNTER — Other Ambulatory Visit: Payer: Self-pay

## 2018-07-18 ENCOUNTER — Encounter: Payer: Self-pay | Admitting: Physical Therapy

## 2018-07-18 ENCOUNTER — Ambulatory Visit: Payer: Medicare Other | Admitting: Physical Therapy

## 2018-07-18 DIAGNOSIS — M25661 Stiffness of right knee, not elsewhere classified: Secondary | ICD-10-CM | POA: Diagnosis not present

## 2018-07-18 DIAGNOSIS — M6281 Muscle weakness (generalized): Secondary | ICD-10-CM

## 2018-07-18 DIAGNOSIS — M25561 Pain in right knee: Secondary | ICD-10-CM

## 2018-07-18 NOTE — Therapy (Signed)
[facility] [address] [place], [place], [address] Phone: [phone]   Fax:  [phone]  Physical Therapy Treatment  Patient Details  Name: Yolanda [place] MRN: [id number] Date of Birth: January 27, 1940 Referring Provider (PT): Dr. Katherine Roan PA  Progress Note Reporting Period 06/28/18 to 07/18/18  See note below for Objective Data and Assessment of Progress/Goals.      Encounter Date: 07/18/2018  PT End of Session - 07/18/18 1115    Visit Number  10    Date for PT Re-Evaluation  08/23/18    PT Start Time  1028    PT Stop Time  1124    PT Time Calculation (min)  56 min    Activity Tolerance  Patient tolerated treatment well       Past Medical History:  Diagnosis Date  . Allergy   . Arthritis   . Breast cancer [facility]) 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  . Cancer ([place])    right breast   . Hyperlipidemia    takes Niacin  . Hypertension   . Personal history of radiation therapy 2011   rt breast    Past Surgical History:  Procedure Laterality Date  . BREAST DUCTAL SYSTEM EXCISION     right breast  . BREAST LUMPECTOMY    . BREAST SURGERY  august 2011   right partial mastectomy  . CATARACT EXTRACTION     bilateral  . CESAREAN SECTION    . ELBOW SURGERY     left  . MYOMECTOMY    . TOTAL KNEE ARTHROPLASTY Right 06/26/2018   Procedure: TOTAL KNEE ARTHROPLASTY;  Surgeon: Paralee Cancel, MD;  [facility]: [facility];  Service: Orthopedics;  Laterality: Right;  70 mins    There were no vitals filed for this visit.  Subjective Assessment - 07/18/18 1027    Subjective  I think having my knee creamed and massaged first really helped last time.  It mainly hurts on the inside of my knee.  I tried walking from one room to the other without my walker but I didn't feel comfortable with that yet.    Patient Stated Goals  drive a car again;  be walking normal and painfree    Currently in Pain?  Yes    Pain Score  3     Pain  Orientation  Right;Medial         [facility] PT Assessment - 07/18/18 0001      Observation/Other Assessments   Focus on Therapeutic Outcomes (FOTO)   53% limitation       AROM   Right Knee Extension  -8   seated    Right Knee Flexion  108   supine      Ambulation/Gait   Assistive device  Rolling walker    Gait Comments  verbal cues to avoid turning foot inward                    [facility] Adult PT Treatment/Exercise - 07/18/18 0001      Knee/Hip Exercises: Stretches   Passive Hamstring Stretch Limitations  on 2nd step 5x    Other Knee/Hip Stretches  2nd step rocks 10x      Knee/Hip Exercises: Aerobic   Recumbent Bike  forward and backward full revolultions 2 minutes       Knee/Hip Exercises: Standing   Heel Raises  Both;15 reps    Forward Step Up  Right;2 sets;5 reps;Hand Hold: 2;Step Height: 4"  Forward Step Up Limitations  moderate UE use on railings    SLS  WB on surgery leg with step taps 10x    Other Standing Knee Exercises  WB on right with 3 ways floor taps 8x    Other Standing Knee Exercises  right foot stepping over upright weight on floor 5x       Knee/Hip Exercises: Seated   Long Arc [employer]  Strengthening;Right;2 sets;10 reps;Weights    Long Arc Quad Weight  2 lbs.    Long [employer] Limitations  inc quad lag with last reps of each set     Other Seated Knee/Hip Exercises  self contract relax quads 5 sec hold 5x to inc knee flexion     Hamstring Curl  Strengthening;Right;2 sets;10 reps    Hamstring Limitations  red band       Vasopneumatic   Number Minutes Vasopneumatic   15 minutes   pillow case covering incision   Vasopnuematic Location   Knee   pt encouraged to do ankle pumps and quad sets every minute   Vasopneumatic Pressure  Medium    Vasopneumatic Temperature   3 flakes      Manual Therapy   Manual therapy comments  retrograde massage Rt foot and calf;  LEs elevated    Passive ROM  supine flexion 20x                PT Short Term  Goals - 07/18/18 1121      PT SHORT TERM GOAL #1   Title  be independent in initial HEP    Status  Achieved      PT SHORT TERM GOAL #2   Title  Right knee extension improved to -5 degrees needed for improved standing and walking tolerance    Time  4    Period  Weeks    Status  On-going      PT SHORT TERM GOAL #3   Title  Right knee flexion improved to 100 degrees for greater ease getting in/out of the car    Status  Achieved      PT SHORT TERM GOAL #4   Title  The patient will be able to walk household distances with a cane    Time  4    Period  Weeks    Status  On-going        PT Long Term Goals - 06/28/18 1725      PT LONG TERM GOAL #1   Title  be independent in advanced HEP    Time  8    Period  Weeks    Status  New    Target Date  08/23/18      PT LONG TERM GOAL #2   Title  reduce FOTO to < or = to 27% limitation    Time  8    Period  Weeks    Status  New      PT LONG TERM GOAL #3   Title  Right knee ROM 3-112 degrees needed with sit to stand and negotiating curbs and stairs    Time  8    Period  Weeks    Status  New      PT LONG TERM GOAL #4   Title  The patient will have improved right LE strength to grossly 4/5 needed for walking community distances    Time  8    Period  Weeks    Status  On-going  PT LONG TERM GOAL #5   Title  The patient will have improved gait speed indicated with Timed up and Go < 20 sec    Time  8    Period  Weeks    Status  New      Additional Long Term Goals   Additional Long Term Goals  Yes      PT LONG TERM GOAL #6   Title  Decreased mid patella circumferential measurement right knee to 47 1/2 cm    Time  8    Period  Weeks    Status  New            Plan - 07/18/18 1116    Clinical Impression Statement  Pitting edema persists in right lower leg but improved skin coloration and soft tissue mobility compared to last week.  Pre treatment knee flexion ROM 101 degrees, end of session 108 degrees.  Quad weakness  persists with quad lag with terminal knee extension against gravity and noted reliance on UE support with full weight bearing on right.  Step ups with right leg on small step also require extensive UE use and therefore not quite ready for transition to cane yet.    Improvement in pain and function evident with FOTO functional outcome score improvement from 63% to 53% limitation.  Progressing with rehab goals. Decreased edema noted following vascompression.  Therapist closely monitoring response with all treatment interventions.    Stability/Clinical Decision Making  Stable/Uncomplicated    Rehab Potential  Good    PT Frequency  3x / week    PT Duration  8 weeks    PT Treatment/Interventions  ADLs/Self Care Home Management;Cryotherapy;Neuromuscular re-education;Therapeutic exercise;Therapeutic activities;Patient/family education;Manual techniques;Taping;Vasopneumatic Device;Electrical Stimulation;Moist Heat    PT Next Visit Plan  retrograde massage; manual therapy;  progressive right knee ROM,  bike, strengthening, proprioception, vasocompression    PT Home Exercise Plan  Access Code: [email or web address]       Patient will benefit from skilled therapeutic intervention in order to improve the following deficits and impairments:  Abnormal gait, Decreased range of motion, Difficulty walking, Decreased activity tolerance, Pain, Decreased strength, Decreased mobility, Increased edema  Visit Diagnosis: 1. Stiffness of right knee, not elsewhere classified   2. Acute pain of right knee   3. Muscle weakness (generalized)        Problem List Patient Active Problem List   Diagnosis Date Noted  . Hypokalemia 06/27/2018  . Overweight (BMI 25.0-29.9) 06/27/2018  . S/P right TKA 06/26/2018  . Autoimmune disease ([place]) 03/15/2016  . High risk medication use 03/15/2016  . Pain in joint of right shoulder 03/15/2016  . Bilateral hip pain 03/15/2016  . Primary osteoarthritis of both knees 03/15/2016  . Fat necrosis  of female breast 11/25/2014  . Arthritis 02/24/2014  . HTN (hypertension) 02/24/2014  . Hyperlipidemia 02/24/2014  . Breast cancer, right, upper outer quadrant  07/05/2010  Ruben Im, PT 07/18/18 11:34 AM Phone: [phone] Fax: [phone] Alvera Singh 07/18/2018, 11:31 AM  [facility] [address], [place], [place], [address] Phone: [phone]   Fax:  [phone]  Name: Yolanda [place] MRN: [id number] Date of Birth: [birth year]

## 2018-07-20 ENCOUNTER — Encounter: Payer: Self-pay | Admitting: Physical Therapy

## 2018-07-20 ENCOUNTER — Other Ambulatory Visit: Payer: Self-pay

## 2018-07-20 ENCOUNTER — Ambulatory Visit: Payer: Medicare Other | Admitting: Physical Therapy

## 2018-07-20 DIAGNOSIS — R600 Localized edema: Secondary | ICD-10-CM

## 2018-07-20 DIAGNOSIS — R2689 Other abnormalities of gait and mobility: Secondary | ICD-10-CM

## 2018-07-20 DIAGNOSIS — M6281 Muscle weakness (generalized): Secondary | ICD-10-CM

## 2018-07-20 DIAGNOSIS — M25661 Stiffness of right knee, not elsewhere classified: Secondary | ICD-10-CM | POA: Diagnosis not present

## 2018-07-20 DIAGNOSIS — M25561 Pain in right knee: Secondary | ICD-10-CM

## 2018-07-20 NOTE — Therapy (Signed)
[facility] [address] [place], [place], [address] Phone: [phone]   Fax:  [phone]  Physical Therapy Treatment  Patient Details  Name: Yolanda Peters MRN: [id number] Date of Birth: Apr 28, 1940 Referring Provider (PT): Dr. Katherine Roan PA   Encounter Date: 07/20/2018  PT End of Session - 07/20/18 1217    Visit Number  11    Date for PT Re-Evaluation  08/23/18    PT Start Time  1103    PT Stop Time  1203    PT Time Calculation (min)  60 min    Activity Tolerance  Patient tolerated treatment well    Behavior During Therapy  [facility] for tasks assessed/performed       Past Medical History:  Diagnosis Date  . Allergy   . Arthritis   . Breast cancer [facility]) 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  . Cancer ([place])    right breast   . Hyperlipidemia    takes Niacin  . Hypertension   . Personal history of radiation therapy 2011   rt breast    Past Surgical History:  Procedure Laterality Date  . BREAST DUCTAL SYSTEM EXCISION     right breast  . BREAST LUMPECTOMY    . BREAST SURGERY  august 2011   right partial mastectomy  . CATARACT EXTRACTION     bilateral  . CESAREAN SECTION    . ELBOW SURGERY     left  . MYOMECTOMY    . TOTAL KNEE ARTHROPLASTY Right 06/26/2018   Procedure: TOTAL KNEE ARTHROPLASTY;  Surgeon: Paralee Cancel, MD;  Location: [facility];  Service: Orthopedics;  Laterality: Right;  70 mins    There were no vitals filed for this visit.  Subjective Assessment - 07/20/18 1127    Subjective  I'm doing my exercises once a day.  I put the compression stocking on today.    Pertinent History  Right TKR 06/26/18 hx of Breast Cancer    Limitations  Walking;House hold activities    How long can you sit comfortably?  no problem    How long can you walk comfortably?  house to car    Diagnostic tests  xray    Patient Stated Goals  drive a car again;  be walking normal and painfree    Currently in Pain?  Yes    Pain  Score  1     Pain Location  Knee    Pain Orientation  Right    Pain Descriptors / Indicators  Aching    Pain Type  Surgical pain    Pain Onset  1 to 4 weeks ago    Pain Frequency  Intermittent    Aggravating Factors   bending knee    Pain Relieving Factors  ice, rest                       [facility] Adult PT Treatment/Exercise - 07/20/18 0001      Knee/Hip Exercises: Aerobic   Recumbent Bike  forward and backward full revolultions 4 minutes       Knee/Hip Exercises: Standing   Hip Abduction  Stengthening;Right;2 sets;10 reps;Knee straight   bil UE support on counter   Hip Extension  Stengthening;Right;2 sets;10 reps;Knee straight    Extension Limitations  bil support of UE on counter    SLS  WB on surgery leg with step taps onto 4" riser 2x10x    Other Standing Knee Exercises  sidestepping  along countertop bil UE support x 3 passes      Knee/Hip Exercises: Seated   Long Arc Quad  Strengthening;Right;2 sets;10 reps;Weights    Long Arc Quad Weight  2 lbs.    Long [employer] Limitations  added hip add ball squeeze to assist quad recruitment    Hamstring Curl  Strengthening;Right;2 sets;10 reps    Hamstring Limitations  red band       Vasopneumatic   Number Minutes Vasopneumatic   15 minutes   pillow case covering incision   Vasopnuematic Location   Knee    Vasopneumatic Pressure  Medium    Vasopneumatic Temperature   3 flakes      Manual Therapy   Manual Therapy  Passive ROM;Soft tissue mobilization    Manual therapy comments  --    Soft tissue mobilization  retrograde massage Rt calf, medial and lateral Rt knee;  LEs elevated    Passive ROM  supine flexion x 20 reps, last 10 hold x 5 sec             PT Education - 07/20/18 1157    Education Details  Access Code: [email or web address]    Person(s) Educated  Patient    Methods  Explanation;Demonstration;Verbal cues;Handout    Comprehension  Verbalized understanding;Returned demonstration       PT Short Term Goals -  07/18/18 1121      PT SHORT TERM GOAL #1   Title  be independent in initial HEP    Status  Achieved      PT SHORT TERM GOAL #2   Title  Right knee extension improved to -5 degrees needed for improved standing and walking tolerance    Time  4    Period  Weeks    Status  On-going      PT SHORT TERM GOAL #3   Title  Right knee flexion improved to 100 degrees for greater ease getting in/out of the car    Status  Achieved      PT SHORT TERM GOAL #4   Title  The patient will be able to walk household distances with a cane    Time  4    Period  Weeks    Status  On-going        PT Long Term Goals - 06/28/18 1725      PT LONG TERM GOAL #1   Title  be independent in advanced HEP    Time  8    Period  Weeks    Status  New    Target Date  08/23/18      PT LONG TERM GOAL #2   Title  reduce FOTO to < or = to 27% limitation    Time  8    Period  Weeks    Status  New      PT LONG TERM GOAL #3   Title  Right knee ROM 3-112 degrees needed with sit to stand and negotiating curbs and stairs    Time  8    Period  Weeks    Status  New      PT LONG TERM GOAL #4   Title  The patient will have improved right LE strength to grossly 4/5 needed for walking community distances    Time  8    Period  Weeks    Status  On-going      PT LONG TERM GOAL #5   Title  The patient will have improved gait  speed indicated with Timed up and Go < 20 sec    Time  8    Period  Weeks    Status  New      Additional Long Term Goals   Additional Long Term Goals  Yes      PT LONG TERM GOAL #6   Title  Decreased mid patella circumferential measurement right knee to 47 1/2 cm    Time  8    Period  Weeks    Status  New            Plan - 07/20/18 1218    Clinical Impression Statement  Pt continues to display difficulty into knee flexion ROM both passively and actively.  PT performed soft tissue mobilizaiton and passive stretching and encouraged Pt to perform efforts toward increased ROM  throughout the day at home.  Added option to perform seated knee flexion with contralateral leg providing overpressure as tolerated.  Pt able to perform new countertop exercises of lateral stepping, hip abd and hip ext without difficulty today so added to HEP.  She continues to display gait deviation of Rt LE IR which needs continuous cueing during gait to correct.  Quad strength is an ongoing target to reduce quad lag.  Pt will continue to benefit from ongoing skilled progression for ROM and strength surrounding Rt surgical knee.    PT Frequency  3x / week    PT Duration  8 weeks    PT Treatment/Interventions  ADLs/Self Care Home Management;Cryotherapy;Neuromuscular re-education;Therapeutic exercise;Therapeutic activities;Patient/family education;Manual techniques;Taping;Vasopneumatic Device;Electrical Stimulation;Moist Heat    PT Next Visit Plan  f/u on increased ROM efforts/compliance at home, review updated HEP prn, consider mirror for gait cueing to control IR of Rt LE, bike, strength, proprioception, vaso    PT Home Exercise Plan  Access Code: [email or web address]    Consulted and Agree with Plan of Care  Patient       Patient will benefit from skilled therapeutic intervention in order to improve the following deficits and impairments:     Visit Diagnosis: 1. Stiffness of right knee, not elsewhere classified   2. Acute pain of right knee   3. Muscle weakness (generalized)   4. Other abnormalities of gait and mobility   5. Localized edema        Problem List Patient Active Problem List   Diagnosis Date Noted  . Hypokalemia 06/27/2018  . Overweight (BMI 25.0-29.9) 06/27/2018  . S/P right TKA 06/26/2018  . Autoimmune disease ([place]) 03/15/2016  . High risk medication use 03/15/2016  . Pain in joint of right shoulder 03/15/2016  . Bilateral hip pain 03/15/2016  . Primary osteoarthritis of both knees 03/15/2016  . 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 Arthritis 02/24/2014  . HTN  (hypertension) 02/24/2014  . Hyperlipidemia 02/24/2014  . Breast cancer, right, upper outer quadrant  07/05/2010    Baruch Merl, PT 07/20/18 12:26 PM   [facility] [address] [place], [place], [address] Phone: [phone]   Fax:  [phone]  Name: Yolanda Peters MRN: [id number] Date of Birth: 12/08/1940

## 2018-07-20 NOTE — Patient Instructions (Signed)
Access Code: [email or web address]  URL: [email or web address]  Date: 07/20/2018  Prepared by: Venetia Night Beuhring   Exercises  Backward Weight Shift and Opposite Arm Raise with Walker - 10 reps - 3 sets - 3x daily - 7x weekly  Seated Knee Extension Stretch with Chair - 1 reps - 1 sets - 60 hold - 3x daily - 7x weekly  Seated Knee Flexion Stretch - 10 reps - 3 sets - 3x daily - 7x weekly  Seated Ankle Dorsiflexion Stretch - 10 reps - 2 sets - 5 hold - 3x daily - 7x weekly  Clamshell - 10 reps - 2 sets - 1x daily - 7x weekly  Standing Heel Raise with Chair Support - 10 reps - 1 sets - 1x daily - 7x weekly  Standing Hip Flexion AROM - 10 reps - 3 sets - 1x daily - 7x weekly  Standing Hip Abduction with Counter Support - 10 reps - 2 sets - 1x daily - 7x weekly  Standing Hip Extension with Counter Support - 10 reps - 2 sets - 1x daily - 7x weekly  Seated Long Arc Quad - 10 reps - 2 sets - 1x daily - 7x weekly

## 2018-07-23 ENCOUNTER — Ambulatory Visit: Payer: Medicare Other | Admitting: Physical Therapy

## 2018-07-23 ENCOUNTER — Encounter: Payer: Self-pay | Admitting: Physical Therapy

## 2018-07-23 ENCOUNTER — Other Ambulatory Visit: Payer: Self-pay

## 2018-07-23 DIAGNOSIS — M6281 Muscle weakness (generalized): Secondary | ICD-10-CM

## 2018-07-23 DIAGNOSIS — M25661 Stiffness of right knee, not elsewhere classified: Secondary | ICD-10-CM

## 2018-07-23 DIAGNOSIS — M25561 Pain in right knee: Secondary | ICD-10-CM

## 2018-07-23 DIAGNOSIS — R2689 Other abnormalities of gait and mobility: Secondary | ICD-10-CM

## 2018-07-23 DIAGNOSIS — R600 Localized edema: Secondary | ICD-10-CM

## 2018-07-23 NOTE — Therapy (Signed)
[facility] [address], [place], [place], [address] Phone: [phone]   Fax:  [phone]  Physical Therapy Treatment  Patient Details  Name: Yolanda Peters MRN: [id number] Date of Birth: Feb 19, 1940 Referring Provider (PT): Dr. Katherine Roan PA   Encounter Date: 07/23/2018  PT End of Session - 07/23/18 1118    Visit Number  12    Date for PT Re-Evaluation  08/23/18    PT Start Time  1027    PT Stop Time  1122    PT Time Calculation (min)  55 min    Activity Tolerance  Patient tolerated treatment well       Past Medical History:  Diagnosis Date  . Allergy   . Arthritis   . Breast cancer [facility]) 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  . Cancer ([place])    right breast   . Hyperlipidemia    takes Niacin  . Hypertension   . Personal history of radiation therapy 2011   rt breast    Past Surgical History:  Procedure Laterality Date  . BREAST DUCTAL SYSTEM EXCISION     right breast  . BREAST LUMPECTOMY    . BREAST SURGERY  august 2011   right partial mastectomy  . CATARACT EXTRACTION     bilateral  . CESAREAN SECTION    . ELBOW SURGERY     left  . MYOMECTOMY    . TOTAL KNEE ARTHROPLASTY Right 06/26/2018   Procedure: TOTAL KNEE ARTHROPLASTY;  Surgeon: Paralee Cancel, MD;  Location: [facility];  Service: Orthopedics;  Laterality: Right;  70 mins    There were no vitals filed for this visit.  Subjective Assessment - 07/23/18 1025    Subjective  Patient presents using her cane today.  I want to learn how to do stairs so I can drive my car soon.    Pertinent History  Right TKR 06/26/18 hx of Breast Cancer    Patient Stated Goals  drive a car again;  be walking normal and painfree    Currently in Pain?  Yes    Pain Score  1     Pain Location  Knee    Pain Orientation  Right    Pain Type  Surgical pain         [facility] PT Assessment - 07/23/18 0001      AROM   Right Knee Extension  -8   seated    Right Knee Flexion   110      Strength   Right Knee Flexion  3+/5    Right Knee Extension  3-/5                   [facility] Adult PT Treatment/Exercise - 07/23/18 0001      Ambulation/Gait   Ambulation Distance (Feet)  40 Feet    Assistive device  Straight cane    Gait Comments  instruction in gait pattern with cane; encouraged use of RW in times of fatigue or longer distance walking       Therapeutic Activites    Therapeutic Activities  ADL's    ADL's  ascending and descending steps with good foot up, bad foot down method with 1 railing and cane       Knee/Hip Exercises: Stretches   Passive Hamstring Stretch Limitations  on 2nd step 5x    Other Knee/Hip Stretches  2nd step rocks 2x 1 minute       Knee/Hip Exercises: Aerobic  Recumbent Bike  forward and backward full revolultions 5 minutes       Knee/Hip Exercises: Machines for Strengthening   Total Gym Leg Press  Seat 8 50# bil 20x, right only 15x 25#       Knee/Hip Exercises: Standing   Hip Abduction  Stengthening;Right;2 sets;10 reps;Knee straight   bil UE support on counter   Forward Step Up  Right;2 sets;5 reps;Hand Hold: 2;Step Height: 4"    Forward Step Up Limitations  moderate UE use on railings    SLS  WB on surgery leg with step taps 10x      Knee/Hip Exercises: Seated   Long Arc Quad  Strengthening;Right;2 sets;10 reps;Weights    Long Arc Quad Weight  2 lbs.    Hamstring Curl  Strengthening;Right;2 sets;10 reps    Hamstring Limitations  red band       Vasopneumatic   Number Minutes Vasopneumatic   15 minutes   pillow case covering incision   Vasopnuematic Location   Knee    Vasopneumatic Pressure  Medium    Vasopneumatic Temperature   3 flakes      Manual Therapy   Passive ROM  seated distraction with and without internal and external tibial rotation 3x 10     Other Manual Therapy  contract relax quads 5x 5 sec holds                PT Short Term Goals - 07/23/18 1130      PT SHORT TERM GOAL #1   Title   be independent in initial HEP    Status  Achieved      PT SHORT TERM GOAL #2   Title  Right knee extension improved to -5 degrees needed for improved standing and walking tolerance    Time  4    Period  Weeks    Status  On-going      PT SHORT TERM GOAL #3   Title  Right knee flexion improved to 100 degrees for greater ease getting in/out of the car    Status  Achieved      PT SHORT TERM GOAL #4   Title  The patient will be able to walk household distances with a cane    Time  4    Period  Weeks    Status  On-going        PT Long Term Goals - 06/28/18 1725      PT LONG TERM GOAL #1   Title  be independent in advanced HEP    Time  8    Period  Weeks    Status  New    Target Date  08/23/18      PT LONG TERM GOAL #2   Title  reduce FOTO to < or = to 27% limitation    Time  8    Period  Weeks    Status  New      PT LONG TERM GOAL #3   Title  Right knee ROM 3-112 degrees needed with sit to stand and negotiating curbs and stairs    Time  8    Period  Weeks    Status  New      PT LONG TERM GOAL #4   Title  The patient will have improved right LE strength to grossly 4/5 needed for walking community distances    Time  8    Period  Weeks    Status  On-going  PT LONG TERM GOAL #5   Title  The patient will have improved gait speed indicated with Timed up and Go < 20 sec    Time  8    Period  Weeks    Status  New      Additional Long Term Goals   Additional Long Term Goals  Yes      PT LONG TERM GOAL #6   Title  Decreased mid patella circumferential measurement right knee to 47 1/2 cm    Time  8    Period  Weeks    Status  New            Plan - 07/23/18 1119    Clinical Impression Statement  The patient continues to have right quad weakness prohibiting  her from ascending a 6 inch curb or step with her surgical leg.   Patient was encouraged to use her RW vs. cane in times of muscular fatigue or longer distances.  Knee flexion ROM improved to 110 degrees  in supine, quad lag limiting knee extension in sitting -8 degrees.  Decreased swelling in knee and lower leg following vasocompression.  Therapist closely monitoring response with all treatment interventions and providing close supervision for safety when ambulating with the cane.    Rehab Potential  Good    PT Frequency  3x / week    PT Duration  8 weeks    PT Treatment/Interventions  ADLs/Self Care Home Management;Cryotherapy;Neuromuscular re-education;Therapeutic exercise;Therapeutic activities;Patient/family education;Manual techniques;Taping;Vasopneumatic Device;Electrical Stimulation;Moist Heat    PT Next Visit Plan  check remaining STGs next visit;  right LE progressive strengthening;  f/u on increased ROM efforts/compliance at home, review updated HEP prn, consider mirror for gait cueing to control IR of Rt LE, bike proprioception, vaso    PT Home Exercise Plan  Access Code: [email or web address]       Patient will benefit from skilled therapeutic intervention in order to improve the following deficits and impairments:  Abnormal gait, Decreased range of motion, Difficulty walking, Decreased activity tolerance, Pain, Decreased strength, Decreased mobility, Increased edema  Visit Diagnosis: 1. Stiffness of right knee, not elsewhere classified   2. Acute pain of right knee   3. Muscle weakness (generalized)   4. Other abnormalities of gait and mobility   5. Localized edema        Problem List Patient Active Problem List   Diagnosis Date Noted  . Hypokalemia 06/27/2018  . Overweight (BMI 25.0-29.9) 06/27/2018  . S/P right TKA 06/26/2018  . Autoimmune disease ([place]) 03/15/2016  . High risk medication use 03/15/2016  . Pain in joint of right shoulder 03/15/2016  . Bilateral hip pain 03/15/2016  . Primary osteoarthritis of both knees 03/15/2016  . 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 Arthritis 02/24/2014  . HTN (hypertension) 02/24/2014  . Hyperlipidemia 02/24/2014  . Breast cancer,  right, upper outer quadrant  07/05/2010   Ruben Im, PT 07/23/18 11:32 AM Phone: [phone] Fax: [phone]  Alvera Singh 07/23/2018, 11:32 AM  [facility] [address] [place], [place], [address] Phone: [phone]   Fax:  [phone]  Name: SIRENITY SHEW MRN: [id number] Date of Birth: 03/15/1940

## 2018-07-25 ENCOUNTER — Other Ambulatory Visit: Payer: Self-pay

## 2018-07-25 ENCOUNTER — Ambulatory Visit: Payer: Medicare Other | Admitting: Physical Therapy

## 2018-07-25 DIAGNOSIS — M6281 Muscle weakness (generalized): Secondary | ICD-10-CM

## 2018-07-25 DIAGNOSIS — R600 Localized edema: Secondary | ICD-10-CM

## 2018-07-25 DIAGNOSIS — M25561 Pain in right knee: Secondary | ICD-10-CM

## 2018-07-25 DIAGNOSIS — M25661 Stiffness of right knee, not elsewhere classified: Secondary | ICD-10-CM

## 2018-07-25 DIAGNOSIS — R2689 Other abnormalities of gait and mobility: Secondary | ICD-10-CM

## 2018-07-25 NOTE — Therapy (Signed)
[facility] [address], [place], [place], [address] Phone: [phone]   Fax:  [phone]  Physical Therapy Treatment  Patient Details  Name: Yolanda Peters MRN: [id number] Date of Birth: 12-18-1940 Referring Provider (PT): Dr. Katherine Roan PA   Encounter Date: 07/25/2018  PT End of Session - 07/25/18 1125    Visit Number  13    Date for PT Re-Evaluation  08/23/18    PT Start Time  1030    PT Stop Time  1130    PT Time Calculation (min)  60 min    Activity Tolerance  Patient tolerated treatment well       Past Medical History:  Diagnosis Date  . Allergy   . Arthritis   . Breast cancer [facility]) 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  . Cancer ([place])    right breast   . Hyperlipidemia    takes Niacin  . Hypertension   . Personal history of radiation therapy 2011   rt breast    Past Surgical History:  Procedure Laterality Date  . BREAST DUCTAL SYSTEM EXCISION     right breast  . BREAST LUMPECTOMY    . BREAST SURGERY  august 2011   right partial mastectomy  . CATARACT EXTRACTION     bilateral  . CESAREAN SECTION    . ELBOW SURGERY     left  . MYOMECTOMY    . TOTAL KNEE ARTHROPLASTY Right 06/26/2018   Procedure: TOTAL KNEE ARTHROPLASTY;  Surgeon: Paralee Cancel, MD;  Location: [facility];  Service: Orthopedics;  Laterality: Right;  70 mins    There were no vitals filed for this visit.  Subjective Assessment - 07/25/18 1034    Subjective  Just a little stiff.  My neighbor helped me into the garage to see if I could do the step.    Pertinent History  Right TKR 06/26/18 hx of Breast Cancer    Limitations  Walking;House hold activities    Patient Stated Goals  drive a car again;  be walking normal and painfree    Currently in Pain?  No/denies    Pain Score  0-No pain    Pain Location  Knee    Pain Orientation  Right    Pain Type  Surgical pain         [facility] PT Assessment - 07/25/18 0001      AROM   Right  Knee Extension  -5   seated -7                  [facility] Adult PT Treatment/Exercise - 07/25/18 0001      Therapeutic Activites    Therapeutic Activities  Other Therapeutic Activities    ADL's  ascending and descending steps with good foot up, bad foot down method with 1 railing and cane     Other Therapeutic Activities  standing, walking, sit to stand       Knee/Hip Exercises: Aerobic   Recumbent Bike  forward and backward rocking and  full revolultions 3 minutes       Knee/Hip Exercises: Machines for Strengthening   Total Gym Leg Press  Seat 8 50# bil 16x, right only 10x 30#      Knee/Hip Exercises: Standing   Hip Abduction  Stengthening;Right;2 sets;10 reps;Knee straight   single  UE support on counter   Forward Step Up  Right;15 reps;Hand Hold: 2;Step Height: 4"    SLS  WB on surgery leg  with step taps 10x   single arm support   Other Standing Knee Exercises  rocker board for ankle DF/PF 30x       Knee/Hip Exercises: Seated   Long Arc Quad  Strengthening;Right;2 sets;10 reps;Weights    Long Arc Quad Weight  2 lbs.      Vasopneumatic   Number Minutes Vasopneumatic   15 minutes   pillow case covering incision   Vasopnuematic Location   Knee    Vasopneumatic Pressure  Medium    Vasopneumatic Temperature   3 flakes      Manual Therapy   Manual therapy comments  passive HS stretch with contract relax to increase knee extension     Soft tissue mobilization  retrograde massage Rt calf, medial and lateral Rt knee;  LEs elevated    Passive ROM  seated and supine distraction with and without internal and external tibial rotation 3x 10 in each position     Other Manual Therapy  contract relax quads 5x 5 sec holds                PT Short Term Goals - 07/25/18 1035      PT SHORT TERM GOAL #1   Title  be independent in initial HEP    Status  Achieved      PT SHORT TERM GOAL #2   Title  Right knee extension improved to -5 degrees needed for improved standing  and walking tolerance    Status  Achieved      PT SHORT TERM GOAL #3   Title  Right knee flexion improved to 100 degrees for greater ease getting in/out of the car    Status  Achieved      PT SHORT TERM GOAL #4   Title  The patient will be able to walk household distances with a cane    Status  Achieved        PT Long Term Goals - 06/28/18 1725      PT LONG TERM GOAL #1   Title  be independent in advanced HEP    Time  8    Period  Weeks    Status  New    Target Date  08/23/18      PT LONG TERM GOAL #2   Title  reduce FOTO to < or = to 27% limitation    Time  8    Period  Weeks    Status  New      PT LONG TERM GOAL #3   Title  Right knee ROM 3-112 degrees needed with sit to stand and negotiating curbs and stairs    Time  8    Period  Weeks    Status  New      PT LONG TERM GOAL #4   Title  The patient will have improved right LE strength to grossly 4/5 needed for walking community distances    Time  8    Period  Weeks    Status  On-going      PT LONG TERM GOAL #5   Title  The patient will have improved gait speed indicated with Timed up and Go < 20 sec    Time  8    Period  Weeks    Status  New      Additional Long Term Goals   Additional Long Term Goals  Yes      PT LONG TERM GOAL #6   Title  Decreased mid patella  circumferential measurement right knee to 47 1/2 cm    Time  8    Period  Weeks    Status  New            Plan - 07/25/18 1126    Clinical Impression Statement  The patient is improving with quad activation  although lacks terminal knee extension ROM.   Increased weight bearing on right LE requiring single arm support vs. 2 arm support.  Patient now ambulating with single point cane but with fatigue she tends to touch objects for support.  Moderate lower leg swelling decreased following retrograde massage and vasocompression.  STGs met.  Therapist providing gait cues and close supervision with standing and ambulation for safety.     Examination-Activity Limitations  Bathing;Bed Mobility;Stairs;Locomotion Level;Lift;Stand    Examination-Participation Restrictions  Community Activity;Driving;Shop;Volunteer    Rehab Potential  Good    PT Frequency  3x / week    PT Duration  8 weeks    PT Treatment/Interventions  ADLs/Self Care Home Management;Cryotherapy;Neuromuscular re-education;Therapeutic exercise;Therapeutic activities;Patient/family education;Manual techniques;Taping;Vasopneumatic Device;Electrical Stimulation;Moist Heat    PT Next Visit Plan  knee flexion and extension ROM;  progressive weight bearing, emphasis on quad strengthening especially terminal extension;  cues for gait sequence with cane and foot positioning to avoid internal rotation    PT Home Exercise Plan  Access Code: [email or web address]       Patient will benefit from skilled therapeutic intervention in order to improve the following deficits and impairments:  Abnormal gait, Decreased range of motion, Difficulty walking, Decreased activity tolerance, Pain, Decreased strength, Decreased mobility, Increased edema  Visit Diagnosis: 1. Stiffness of right knee, not elsewhere classified   2. Acute pain of right knee   3. Muscle weakness (generalized)   4. Other abnormalities of gait and mobility   5. Localized edema        Problem List Patient Active Problem List   Diagnosis Date Noted  . Hypokalemia 06/27/2018  . Overweight (BMI 25.0-29.9) 06/27/2018  . S/P right TKA 06/26/2018  . Autoimmune disease ([place]) 03/15/2016  . High risk medication use 03/15/2016  . Pain in joint of right shoulder 03/15/2016  . Bilateral hip pain 03/15/2016  . Primary osteoarthritis of both knees 03/15/2016  . 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 Arthritis 02/24/2014  . HTN (hypertension) 02/24/2014  . Hyperlipidemia 02/24/2014  . Breast cancer, right, upper outer quadrant  07/05/2010   Ruben Im, PT 07/25/18 11:41 AM Phone: [phone] Fax:  [phone] Alvera Singh 07/25/2018, 11:41 AM  [facility] [address] [place], [place], [address] Phone: [phone]   Fax:  [phone]  Name: Yolanda Peters MRN: [id number] Date of Birth: 03-31-1940

## 2018-07-27 ENCOUNTER — Ambulatory Visit: Payer: Medicare Other | Admitting: Physical Therapy

## 2018-07-27 ENCOUNTER — Encounter: Payer: Self-pay | Admitting: Physical Therapy

## 2018-07-27 ENCOUNTER — Other Ambulatory Visit: Payer: Self-pay

## 2018-07-27 DIAGNOSIS — M25661 Stiffness of right knee, not elsewhere classified: Secondary | ICD-10-CM | POA: Diagnosis not present

## 2018-07-27 DIAGNOSIS — M6281 Muscle weakness (generalized): Secondary | ICD-10-CM

## 2018-07-27 DIAGNOSIS — R600 Localized edema: Secondary | ICD-10-CM

## 2018-07-27 DIAGNOSIS — R2689 Other abnormalities of gait and mobility: Secondary | ICD-10-CM

## 2018-07-27 DIAGNOSIS — M25561 Pain in right knee: Secondary | ICD-10-CM

## 2018-07-27 NOTE — Therapy (Signed)
[facility] [address] [place], [place], [address] Phone: [phone]   Fax:  [phone]  Physical Therapy Treatment  Patient Details  Name: Yolanda Peters MRN: [id number] Date of Birth: 1940-03-05 Referring Provider (PT): Dr. Katherine Roan PA   Encounter Date: 07/27/2018  PT End of Session - 07/27/18 0953    Visit Number  14    Date for PT Re-Evaluation  08/23/18    PT Start Time  0955    PT Stop Time  1100    PT Time Calculation (min)  65 min    Activity Tolerance  Patient tolerated treatment well    Behavior During Therapy  [facility] for tasks assessed/performed       Past Medical History:  Diagnosis Date  . Allergy   . Arthritis   . Breast cancer [facility]) 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  . Cancer ([place])    right breast   . Hyperlipidemia    takes Niacin  . Hypertension   . Personal history of radiation therapy 2011   rt breast    Past Surgical History:  Procedure Laterality Date  . BREAST DUCTAL SYSTEM EXCISION     right breast  . BREAST LUMPECTOMY    . BREAST SURGERY  august 2011   right partial mastectomy  . CATARACT EXTRACTION     bilateral  . CESAREAN SECTION    . ELBOW SURGERY     left  . MYOMECTOMY    . TOTAL KNEE ARTHROPLASTY Right 06/26/2018   Procedure: TOTAL KNEE ARTHROPLASTY;  Surgeon: Paralee Cancel, MD;  Location: [facility];  Service: Orthopedics;  Laterality: Right;  70 mins    There were no vitals filed for this visit.  Subjective Assessment - 07/27/18 0954    Subjective  I'm a little stiff and continue to work on the cane.  I wish I could put heat on my knee when it feels heavy.    Pertinent History  Right TKR 06/26/18 hx of Breast Cancer    How long can you sit comfortably?  no problem    How long can you walk comfortably?  house to car    Diagnostic tests  xray    Patient Stated Goals  drive a car again;  be walking normal and painfree    Currently in Pain?  Yes    Pain Score  1      Pain Location  Knee    Pain Orientation  Right    Pain Descriptors / Indicators  Aching;Tightness    Pain Type  Surgical pain    Pain Onset  More than a month ago    Pain Frequency  Intermittent    Aggravating Factors   bending knee, walking with cane    Pain Relieving Factors  ice, rest                       [facility] Adult PT Treatment/Exercise - 07/27/18 0001      Ambulation/Gait   Assistive device  Straight cane    Gait Pattern  Step-through pattern    Ambulation Surface  Level    Gait Comments  PT cued Rt glute med/quad in WB, more knee flexion during swing phase, control of in-toeing      Knee/Hip Exercises: Machines for Strengthening   Total Gym Leg Press  Seat 7 50# bil x 20 reps, 30# x 10 reps Rt LE only  Knee/Hip Exercises: Standing   Hip Flexion Limitations  marching toe taps bil x 20 with focus on WB Rt LE glute med and quad activation, exaggerated hip flexion cued by PT    Terminal Knee Extension  Strengthening;Right;10 reps;Theraband    Terminal Knee Extension Limitations  red band, PT heavily cued knee motion vs hip motion, Pt moderately successful with technique    Hip Abduction  Stengthening;10 reps;Knee straight    Hip Extension  Stengthening;Right;Knee straight;10 reps    Other Standing Knee Exercises  weight shifting x 10 reps prior to marching to cue closed chain glute med and quad      Knee/Hip Exercises: Supine   Quad Sets  Strengthening;Right;10 reps   5 sec holds     Vasopneumatic   Number Minutes Vasopneumatic   15 minutes   pillow case covering incision   Vasopnuematic Location   Knee    Vasopneumatic Pressure  Medium    Vasopneumatic Temperature   3 flakes      Manual Therapy   Manual Therapy  Joint mobilization    Joint Mobilization  patellar mobs 4-way    Soft tissue mobilization  retrograde massage Rt calf, medial and lateral Rt knee;  LEs elevated    Passive ROM  supine knee flexion and extension, AA/ROM and P/ROM                PT Short Term Goals - 07/25/18 1035      PT SHORT TERM GOAL #1   Title  be independent in initial HEP    Status  Achieved      PT SHORT TERM GOAL #2   Title  Right knee extension improved to -5 degrees needed for improved standing and walking tolerance    Status  Achieved      PT SHORT TERM GOAL #3   Title  Right knee flexion improved to 100 degrees for greater ease getting in/out of the car    Status  Achieved      PT SHORT TERM GOAL #4   Title  The patient will be able to walk household distances with a cane    Status  Achieved        PT Long Term Goals - 07/27/18 0954      PT LONG TERM GOAL #1   Title  be independent in advanced HEP    Status  On-going      PT LONG TERM GOAL #3   Title  Right knee ROM 3-112 degrees needed with sit to stand and negotiating curbs and stairs    Status  On-going      PT LONG TERM GOAL #4   Title  The patient will have improved right LE strength to grossly 4/5 needed for walking community distances    Status  On-going      PT LONG TERM GOAL #5   Title  The patient will have improved gait speed indicated with Timed up and Go < 20 sec            Plan - 07/27/18 1058    Clinical Impression Statement  Pt with need for closed chain cueing for control of Rt LE at hip, knee and foot. Improves with conscious thought but quick to revert to in-toeing.  Knee ROM for flexion reached 115 in sitting after manual therapy.  PT introduced TKEs today but Pt needed signif cueing for form and technqiue.  PT observed improved patellar mobility within quad set today suggesting improved  recruitment.  She will continue to benefit from skilled PT for gait training and focus on ROM and strength in open and closed chain.    PT Frequency  3x / week    PT Duration  8 weeks    PT Treatment/Interventions  ADLs/Self Care Home Management;Cryotherapy;Neuromuscular re-education;Therapeutic exercise;Therapeutic activities;Patient/family  education;Manual techniques;Taping;Vasopneumatic Device;Electrical Stimulation;Moist Heat    PT Next Visit Plan  knee flexion and extension ROM;  progressive weight bearing, emphasis on quad strengthening especially terminal extension;  cues for gait sequence with cane and foot positioning to avoid internal rotation    PT Home Exercise Plan  Access Code: [email or web address]    Consulted and Agree with Plan of Care  Patient       Patient will benefit from skilled therapeutic intervention in order to improve the following deficits and impairments:     Visit Diagnosis: 1. Stiffness of right knee, not elsewhere classified   2. Acute pain of right knee   3. Muscle weakness (generalized)   4. Other abnormalities of gait and mobility   5. Localized edema        Problem List Patient Active Problem List   Diagnosis Date Noted  . Hypokalemia 06/27/2018  . Overweight (BMI 25.0-29.9) 06/27/2018  . S/P right TKA 06/26/2018  . Autoimmune disease ([place]) 03/15/2016  . High risk medication use 03/15/2016  . Pain in joint of right shoulder 03/15/2016  . Bilateral hip pain 03/15/2016  . Primary osteoarthritis of both knees 03/15/2016  . 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 Arthritis 02/24/2014  . HTN (hypertension) 02/24/2014  . Hyperlipidemia 02/24/2014  . Breast cancer, right, upper outer quadrant  07/05/2010    Baruch Merl, PT 07/27/18 11:10 AM   [facility] [address], [place], [place], [address] Phone: [phone]   Fax:  [phone]  Name: Yolanda Peters MRN: [id number] Date of Birth: August 16, 1940

## 2018-07-30 ENCOUNTER — Other Ambulatory Visit: Payer: Self-pay

## 2018-07-30 ENCOUNTER — Ambulatory Visit: Payer: Medicare Other | Admitting: Physical Therapy

## 2018-07-30 ENCOUNTER — Encounter: Payer: Self-pay | Admitting: Physical Therapy

## 2018-07-30 DIAGNOSIS — R600 Localized edema: Secondary | ICD-10-CM

## 2018-07-30 DIAGNOSIS — M6281 Muscle weakness (generalized): Secondary | ICD-10-CM

## 2018-07-30 DIAGNOSIS — M25661 Stiffness of right knee, not elsewhere classified: Secondary | ICD-10-CM

## 2018-07-30 DIAGNOSIS — M25561 Pain in right knee: Secondary | ICD-10-CM

## 2018-07-30 DIAGNOSIS — R2689 Other abnormalities of gait and mobility: Secondary | ICD-10-CM

## 2018-07-30 NOTE — Therapy (Signed)
[facility] [address], [place], [place], [address] Phone: [phone]   Fax:  [phone]  Physical Therapy Treatment  Patient Details  Name: Yolanda Peters MRN: [id number] Date of Birth: [birth year] Referring Provider (PT): Dr. Katherine Roan PA   Encounter Date: 07/30/2018  PT End of Session - 07/30/18 1034    Visit Number  15    Date for PT Re-Evaluation  08/23/18    PT Start Time  1034    PT Stop Time  1130    PT Time Calculation (min)  56 min    Activity Tolerance  Patient tolerated treatment well       Past Medical History:  Diagnosis Date  . Allergy   . Arthritis   . Breast cancer [facility]) 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  . Cancer ([place])    right breast   . Hyperlipidemia    takes Niacin  . Hypertension   . Personal history of radiation therapy 2011   rt breast    Past Surgical History:  Procedure Laterality Date  . BREAST DUCTAL SYSTEM EXCISION     right breast  . BREAST LUMPECTOMY    . BREAST SURGERY  august 2011   right partial mastectomy  . CATARACT EXTRACTION     bilateral  . CESAREAN SECTION    . ELBOW SURGERY     left  . MYOMECTOMY    . TOTAL KNEE ARTHROPLASTY Right 06/26/2018   Procedure: TOTAL KNEE ARTHROPLASTY;  Surgeon: Paralee Cancel, MD;  Location: [facility];  Service: Orthopedics;  Laterality: Right;  70 mins    There were no vitals filed for this visit.  Subjective Assessment - 07/30/18 1037    Subjective  Sometimes I walk without the cane.  I can see my ankle better today.  Some night pain but sleeping OK.    Pertinent History  Right TKR 06/26/18 hx of Breast Cancer    Currently in Pain?  Yes    Pain Score  2     Pain Location  Knee    Pain Orientation  Right    Pain Type  Surgical pain                       [facility] Adult PT Treatment/Exercise - 07/30/18 0001      Therapeutic Activites    ADL's  climbing stairs;  stepping over 3 stacked cones to simulate  stepping over side of bathtub     Other Therapeutic Activities  standing, walking, sit to stand       Knee/Hip Exercises: Stretches   Passive Hamstring Stretch Limitations  on 2nd step 5x    Other Knee/Hip Stretches  2nd step rocks 2x 1 minute       Knee/Hip Exercises: Aerobic   Recumbent Bike  forward and backward rocking and  full revolultions 3 minutes       Knee/Hip Exercises: Machines for Strengthening   Total Gym Leg Press  Seat 7 55# bil x 15 reps, 30# 20 reps Rt LE only      Knee/Hip Exercises: Standing   Forward Step Up  Right;15 reps;Hand Hold: 1;Step Height: 4"    SLS  WB on surgery leg with step taps 10x   single arm support     Vasopneumatic   Number Minutes Vasopneumatic   15 minutes    Vasopnuematic Location   Knee    Vasopneumatic Pressure  Medium  Vasopneumatic Temperature   3 flakes      Manual Therapy   Joint Mobilization  patellar mobs 4-way; extension mobs grade 2/3     Soft tissue mobilization  retrograde massage Rt calf, medial and lateral Rt knee;  LEs elevated    Passive ROM  supine knee flexion and extension, AA/ROM and P/ROM               PT Short Term Goals - 07/25/18 1035      PT SHORT TERM GOAL #1   Title  be independent in initial HEP    Status  Achieved      PT SHORT TERM GOAL #2   Title  Right knee extension improved to -5 degrees needed for improved standing and walking tolerance    Status  Achieved      PT SHORT TERM GOAL #3   Title  Right knee flexion improved to 100 degrees for greater ease getting in/out of the car    Status  Achieved      PT SHORT TERM GOAL #4   Title  The patient will be able to walk household distances with a cane    Status  Achieved        PT Long Term Goals - 07/27/18 0954      PT LONG TERM GOAL #1   Title  be independent in advanced HEP    Status  On-going      PT LONG TERM GOAL #3   Title  Right knee ROM 3-112 degrees needed with sit to stand and negotiating curbs and stairs    Status   On-going      PT LONG TERM GOAL #4   Title  The patient will have improved right LE strength to grossly 4/5 needed for walking community distances    Status  On-going      PT LONG TERM GOAL #5   Title  The patient will have improved gait speed indicated with Timed up and Go < 20 sec            Plan - 07/30/18 1125    Clinical Impression Statement  The patient has improving quad motor control with step ups and decreased UE support needed.  Verbal cues to avoid compensatory circumducting with standing hip/knee flexion.  Treatment focus on knee flexion and extension ROM as well as edema control.  End of session notable reduction in lower leg redness and swelling.    Examination-Activity Limitations  Bathing;Bed Mobility;Stairs;Locomotion Level;Lift;Stand    Examination-Participation Restrictions  Community Activity;Driving;Shop;Volunteer    Rehab Potential  Good    PT Frequency  3x / week    PT Duration  8 weeks    PT Treatment/Interventions  ADLs/Self Care Home Management;Cryotherapy;Neuromuscular re-education;Therapeutic exercise;Therapeutic activities;Patient/family education;Manual techniques;Taping;Vasopneumatic Device;Electrical Stimulation;Moist Heat    PT Next Visit Plan  KX modifier;  knee flexion and extension ROM;  progressive weight bearing, emphasis on quad strengthening especially terminal extension;  cues for gait sequence with cane and foot positioning to avoid internal rotation    PT Home Exercise Plan  Access Code: [email or web address]       Patient will benefit from skilled therapeutic intervention in order to improve the following deficits and impairments:  Abnormal gait, Decreased range of motion, Difficulty walking, Decreased activity tolerance, Pain, Decreased strength, Decreased mobility, Increased edema  Visit Diagnosis: 1. Stiffness of right knee, not elsewhere classified   2. Acute pain of right knee   3. Muscle weakness (generalized)  4. Other abnormalities of gait  and mobility   5. Localized edema        Problem List Patient Active Problem List   Diagnosis Date Noted  . Hypokalemia 06/27/2018  . Overweight (BMI 25.0-29.9) 06/27/2018  . S/P right TKA 06/26/2018  . Autoimmune disease ([place]) 03/15/2016  . High risk medication use 03/15/2016  . Pain in joint of right shoulder 03/15/2016  . Bilateral hip pain 03/15/2016  . Primary osteoarthritis of both knees 03/15/2016  . 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 Arthritis 02/24/2014  . HTN (hypertension) 02/24/2014  . Hyperlipidemia 02/24/2014  . Breast cancer, right, upper outer quadrant  07/05/2010   Ruben Im, PT 07/30/18 11:43 AM Phone: [phone] Fax: [phone] Alvera Singh 07/30/2018, 11:42 AM  [facility] [address] [place], [place], [address] Phone: [phone]   Fax:  [phone]  Name: DAMIKA HARMON MRN: [id number] Date of Birth: Jun 30, 1940

## 2018-08-01 ENCOUNTER — Encounter: Payer: Self-pay | Admitting: Physical Therapy

## 2018-08-01 ENCOUNTER — Ambulatory Visit: Payer: Medicare Other | Admitting: Physical Therapy

## 2018-08-01 ENCOUNTER — Other Ambulatory Visit: Payer: Self-pay

## 2018-08-01 DIAGNOSIS — M25661 Stiffness of right knee, not elsewhere classified: Secondary | ICD-10-CM

## 2018-08-01 DIAGNOSIS — R600 Localized edema: Secondary | ICD-10-CM

## 2018-08-01 DIAGNOSIS — M6281 Muscle weakness (generalized): Secondary | ICD-10-CM

## 2018-08-01 DIAGNOSIS — R2689 Other abnormalities of gait and mobility: Secondary | ICD-10-CM

## 2018-08-01 DIAGNOSIS — M25561 Pain in right knee: Secondary | ICD-10-CM

## 2018-08-01 NOTE — Patient Instructions (Signed)
Access Code: [email or web address]  URL: [email or web address]  Date: 08/01/2018  Prepared by: Ruben Im   Exercises  Backward Weight Shift and Opposite Arm Raise with Walker - 10 reps - 3 sets - 3x daily - 7x weekly  Seated Knee Extension Stretch with Chair - 1 reps - 1 sets - 60 hold - 3x daily - 7x weekly  Seated Knee Flexion Stretch - 10 reps - 3 sets - 3x daily - 7x weekly  Seated Ankle Dorsiflexion Stretch - 10 reps - 2 sets - 5 hold - 3x daily - 7x weekly  Clamshell - 10 reps - 2 sets - 1x daily - 7x weekly  Standing Heel Raise with Chair Support - 10 reps - 1 sets - 1x daily - 7x weekly  Standing Hip Flexion AROM - 10 reps - 3 sets - 1x daily - 7x weekly  Standing Hip Abduction with Counter Support - 10 reps - 2 sets - 1x daily - 7x weekly  Standing Hip Extension with Counter Support - 10 reps - 2 sets - 1x daily - 7x weekly  Seated Long Arc Quad - 10 reps - 2 sets - 1x daily - 7x weekly  Standing Knee Flexion Stretch on Step - 10 reps - 1 sets - 3x daily - 7x weekly  Forward Step Up with Counter Support - 10 reps - 1-2 sets - 1x daily - 7x weekly

## 2018-08-01 NOTE — Therapy (Signed)
[facility] [address] [place], [place], [address] Phone: [phone]   Fax:  [phone]  Physical Therapy Treatment  Patient Details  Name: Yolanda Peters MRN: [id number] Date of Birth: 1940-10-06 Referring Provider (PT): Dr. Katherine Roan PA   Encounter Date: 08/01/2018  PT End of Session - 08/01/18 1122    Visit Number  16    Date for PT Re-Evaluation  08/23/18    PT Start Time  1027    PT Stop Time  6073    PT Time Calculation (min)  59 min    Activity Tolerance  Patient tolerated treatment well       Past Medical History:  Diagnosis Date  . Allergy   . Arthritis   . Breast cancer [facility]) 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  . Cancer ([place])    right breast   . Hyperlipidemia    takes Niacin  . Hypertension   . Personal history of radiation therapy 2011   rt breast    Past Surgical History:  Procedure Laterality Date  . BREAST DUCTAL SYSTEM EXCISION     right breast  . BREAST LUMPECTOMY    . BREAST SURGERY  august 2011   right partial mastectomy  . CATARACT EXTRACTION     bilateral  . CESAREAN SECTION    . ELBOW SURGERY     left  . MYOMECTOMY    . TOTAL KNEE ARTHROPLASTY Right 06/26/2018   Procedure: TOTAL KNEE ARTHROPLASTY;  Surgeon: Paralee Cancel, MD;  Location: [facility];  Service: Orthopedics;  Laterality: Right;  70 mins    There were no vitals filed for this visit.  Subjective Assessment - 08/01/18 1048    Subjective  No pain.  I iced before I came.    Pertinent History  Right TKR 06/26/18 hx of Breast Cancer    Limitations  Walking;House hold activities    Currently in Pain?  No/denies    Pain Score  0-No pain    Pain Location  Knee    Pain Orientation  Right    Pain Type  Surgical pain                       [facility] Adult PT Treatment/Exercise - 08/01/18 0001      Therapeutic Activites    ADL's  climbing stairs;  stepping over 3 stacked cones to simulate stepping over side  of bathtub     Other Therapeutic Activities  standing, walking, sit to stand       Knee/Hip Exercises: Stretches   Passive Hamstring Stretch Limitations  on 2nd step 5x    Other Knee/Hip Stretches  2nd step rocks 2x 1 minute       Knee/Hip Exercises: Machines for Strengthening   Total Gym Leg Press  Seat 7 60# bil x 20 reps, 30# 20 reps Rt LE only      Knee/Hip Exercises: Standing   Heel Raises Limitations  rocker board PF and DF ankle  30x     Forward Step Up  Right;10 reps;Hand Hold: 2;Step Height: 6"   significant UE support with higher step   SLS  WB on right with left green band hip extension 10x     Gait Training  ladder walk high stepping, alterating feet    CGA for safety      Knee/Hip Exercises: Seated   Long Arc Quad  Strengthening;Right;2 sets;10 reps;Weights    Long Arc  Quad Weight  2 lbs.      Knee/Hip Exercises: Supine   Other Supine Knee/Hip Exercises  green ball rolls for knee flexion 2x10    Other Supine Knee/Hip Exercises  HS sets on green ball 10x      Vasopneumatic   Number Minutes Vasopneumatic   15 minutes    Vasopnuematic Location   Knee    Vasopneumatic Pressure  Medium    Vasopneumatic Temperature   3 flakes      Manual Therapy   Joint Mobilization  patellar mobs 4-way; extension mobs grade 2/3     Soft tissue mobilization  retrograde massage Rt calf, medial and lateral Rt knee;  LEs elevated    Passive ROM  supine knee flexion and extension, AA/ROM and P/ROM             PT Education - 08/01/18 1121    Education Details  Access Code: [email or web address]   seated knee extension with weight, step ups small step holding to counter, knee flexion on step    Person(s) Educated  Patient    Methods  Explanation;Demonstration;Handout    Comprehension  Verbalized understanding;Returned demonstration       PT Short Term Goals - 07/25/18 1035      PT SHORT TERM GOAL #1   Title  be independent in initial HEP    Status  Achieved      PT SHORT TERM GOAL #2    Title  Right knee extension improved to -5 degrees needed for improved standing and walking tolerance    Status  Achieved      PT SHORT TERM GOAL #3   Title  Right knee flexion improved to 100 degrees for greater ease getting in/out of the car    Status  Achieved      PT SHORT TERM GOAL #4   Title  The patient will be able to walk household distances with a cane    Status  Achieved        PT Long Term Goals - 07/27/18 0954      PT LONG TERM GOAL #1   Title  be independent in advanced HEP    Status  On-going      PT LONG TERM GOAL #3   Title  Right knee ROM 3-112 degrees needed with sit to stand and negotiating curbs and stairs    Status  On-going      PT LONG TERM GOAL #4   Title  The patient will have improved right LE strength to grossly 4/5 needed for walking community distances    Status  On-going      PT LONG TERM GOAL #5   Title  The patient will have improved gait speed indicated with Timed up and Go < 20 sec            Plan - 08/01/18 1117    Clinical Impression Statement  The patient continues to have quad muscle weakness which contributes to ability to fully extend her knee.  Max verbal cues and tactile cues to activate quads especially at terminal knee extension. Close supervision needed for safety with full weight bearing activities secondary to quad weakness.   Lower leg edema still pitting but improves significantly following retrograde massage and vasocompression. Overall pain level remains low.    Rehab Potential  Good    PT Frequency  3x / week    PT Duration  8 weeks    PT Treatment/Interventions  ADLs/Self Care Home Management;Cryotherapy;Neuromuscular  re-education;Therapeutic exercise;Therapeutic activities;Patient/family education;Manual techniques;Taping;Vasopneumatic Device;Electrical Stimulation;Moist Heat    PT Next Visit Plan  KX modifier;  try NMES to quads with exercise;  check ROM;  knee flexion and extension ROM;  progressive weight bearing,  emphasis on quad strengthening especially terminal extension;  cues for gait sequence with cane and foot positioning to avoid internal rotation    PT Home Exercise Plan  Access Code: [email or web address]       Patient will benefit from skilled therapeutic intervention in order to improve the following deficits and impairments:  Abnormal gait, Decreased range of motion, Difficulty walking, Decreased activity tolerance, Pain, Decreased strength, Decreased mobility, Increased edema  Visit Diagnosis: 1. Stiffness of right knee, not elsewhere classified   2. Acute pain of right knee   3. Muscle weakness (generalized)   4. Other abnormalities of gait and mobility   5. Localized edema        Problem List Patient Active Problem List   Diagnosis Date Noted  . Hypokalemia 06/27/2018  . Overweight (BMI 25.0-29.9) 06/27/2018  . S/P right TKA 06/26/2018  . Autoimmune disease ([place]) 03/15/2016  . High risk medication use 03/15/2016  . Pain in joint of right shoulder 03/15/2016  . Bilateral hip pain 03/15/2016  . Primary osteoarthritis of both knees 03/15/2016  . 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 Arthritis 02/24/2014  . HTN (hypertension) 02/24/2014  . Hyperlipidemia 02/24/2014  . Breast cancer, right, upper outer quadrant  07/05/2010   Ruben Im, PT 08/01/18 11:47 AM Phone: [phone] Fax: [phone] Alvera Singh 08/01/2018, 11:47 AM  [facility] [address] [place], [place], [address] Phone: [phone]   Fax:  [phone]  Name: Yolanda Peters MRN: [id number] Date of Birth: 1940-10-26

## 2018-08-02 ENCOUNTER — Encounter: Payer: Self-pay | Admitting: Physical Therapy

## 2018-08-02 ENCOUNTER — Other Ambulatory Visit: Payer: Self-pay

## 2018-08-02 ENCOUNTER — Ambulatory Visit: Payer: Medicare Other | Admitting: Physical Therapy

## 2018-08-02 DIAGNOSIS — R2689 Other abnormalities of gait and mobility: Secondary | ICD-10-CM

## 2018-08-02 DIAGNOSIS — M25661 Stiffness of right knee, not elsewhere classified: Secondary | ICD-10-CM | POA: Diagnosis not present

## 2018-08-02 DIAGNOSIS — R600 Localized edema: Secondary | ICD-10-CM

## 2018-08-02 DIAGNOSIS — M25561 Pain in right knee: Secondary | ICD-10-CM

## 2018-08-02 DIAGNOSIS — M6281 Muscle weakness (generalized): Secondary | ICD-10-CM

## 2018-08-02 NOTE — Therapy (Signed)
[facility] [address] [place], [place], [address] Phone: [phone]   Fax:  [phone]  Physical Therapy Treatment  Patient Details  Name: Yolanda Peters MRN: [id number] Date of Birth: [birth year] Referring Provider (PT): Dr. Katherine Roan PA   Encounter Date: 08/02/2018  PT End of Session - 08/02/18 1137    Visit Number  17    Date for PT Re-Evaluation  08/23/18    PT Start Time  1028    PT Stop Time  1122    PT Time Calculation (min)  54 min    Activity Tolerance  Patient tolerated treatment well       Past Medical History:  Diagnosis Date  . Allergy   . Arthritis   . Breast cancer [facility]) 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  . Cancer ([place])    right breast   . Hyperlipidemia    takes Niacin  . Hypertension   . Personal history of radiation therapy 2011   rt breast    Past Surgical History:  Procedure Laterality Date  . BREAST DUCTAL SYSTEM EXCISION     right breast  . BREAST LUMPECTOMY    . BREAST SURGERY  august 2011   right partial mastectomy  . CATARACT EXTRACTION     bilateral  . CESAREAN SECTION    . ELBOW SURGERY     left  . MYOMECTOMY    . TOTAL KNEE ARTHROPLASTY Right 06/26/2018   Procedure: TOTAL KNEE ARTHROPLASTY;  Surgeon: Paralee Cancel, MD;  Location: [facility];  Service: Orthopedics;  Laterality: Right;  70 mins    There were no vitals filed for this visit.  Subjective Assessment - 08/02/18 1030    Subjective  Patient asks about driving soon.  I've been able to walk some around the house without my cane.  I used my home cycle this morning.    Pertinent History  Right TKR 06/26/18 hx of Breast Cancer;  sees MD 7/29    Patient Stated Goals  drive a car again;  be walking normal and painfree    Currently in Pain?  No/denies    Pain Score  0-No pain    Pain Location  Knee    Pain Orientation  Right    Pain Type  Surgical pain         [facility] PT Assessment - 08/02/18 0001      AROM   Right Knee Extension  4    Right Knee Flexion  117                   [facility] Adult PT Treatment/Exercise - 08/02/18 0001      Knee/Hip Exercises: Stretches   Passive Hamstring Stretch Limitations  on 2nd step 5x    Other Knee/Hip Stretches  2nd step rocks 2x 1 minute       Knee/Hip Exercises: Standing   Forward Step Up  Right;20 reps;Hand Hold: 2;Step Height: 4"      Knee/Hip Exercises: Seated   Long Arc Quad  AROM;Right;5 reps    Long [employer] Limitations  concurrent with NMES    Other Seated Knee/Hip Exercises  retro stepping 10x concurrent with ES    Other Seated Knee/Hip Exercises  TKE with green band concurrent with ES     Marching Limitations  step taps with left, WB on right concurrent with ES 20x     Abd/Adduction Limitations  WB on right with left hip abduction 15x  concurrent with ES       Acupuncturist Location  right Hydrographic surveyor Parameters  38 V 5 on /5 off 15 min     Electrical Stimulation Goals  Strength;Neuromuscular facilitation      Vasopneumatic   Number Minutes Vasopneumatic   10 minutes    Vasopnuematic Location   Knee    Vasopneumatic Pressure  Medium    Vasopneumatic Temperature   3 flakes      Manual Therapy   Joint Mobilization  patellar mobs 4-way; extension mobs grade 2/3     Passive ROM  seated and supine distraction with and without internal and external tibial rotation 3x 10 in each position                PT Short Term Goals - 07/25/18 1035      PT SHORT TERM GOAL #1   Title  be independent in initial HEP    Status  Achieved      PT SHORT TERM GOAL #2   Title  Right knee extension improved to -5 degrees needed for improved standing and walking tolerance    Status  Achieved      PT SHORT TERM GOAL #3   Title  Right knee flexion improved to 100 degrees for greater ease getting in/out of the car    Status  Achieved      PT  SHORT TERM GOAL #4   Title  The patient will be able to walk household distances with a cane    Status  Achieved        PT Long Term Goals - 07/27/18 0954      PT LONG TERM GOAL #1   Title  be independent in advanced HEP    Status  On-going      PT LONG TERM GOAL #3   Title  Right knee ROM 3-112 degrees needed with sit to stand and negotiating curbs and stairs    Status  On-going      PT LONG TERM GOAL #4   Title  The patient will have improved right LE strength to grossly 4/5 needed for walking community distances    Status  On-going      PT LONG TERM GOAL #5   Title  The patient will have improved gait speed indicated with Timed up and Go < 20 sec            Plan - 08/02/18 1138    Clinical Impression Statement  The patient has improving ROM for both flexion and extension.  Improved quad motor contraction following NMES.  Verbal and tactile cues to encourage quad muscle activation and decrease compensatory strategies.  Decreasing edema following treatment session. Pain intensity level remains low.     Rehab Potential  Good    PT Frequency  3x / week    PT Duration  8 weeks    PT Treatment/Interventions  ADLs/Self Care Home Management;Cryotherapy;Neuromuscular re-education;Therapeutic exercise;Therapeutic activities;Patient/family education;Manual techniques;Taping;Vasopneumatic Device;Electrical Stimulation;Moist Heat    PT Next Visit Plan  KX modifier;   NMES to quads with exercise;  check ROM;  knee flexion and extension ROM;  progressive weight bearing, emphasis on quad strengthening especially terminal extension;  cues for gait sequence with cane and foot positioning to avoid internal rotation    PT Home Exercise Plan  Access Code: [email or web address]       Patient  will benefit from skilled therapeutic intervention in order to improve the following deficits and impairments:  Abnormal gait, Decreased range of motion, Difficulty walking, Decreased activity tolerance, Pain,  Decreased strength, Decreased mobility, Increased edema  Visit Diagnosis: 1. Stiffness of right knee, not elsewhere classified   2. Acute pain of right knee   3. Muscle weakness (generalized)   4. Other abnormalities of gait and mobility   5. Localized edema        Problem List Patient Active Problem List   Diagnosis Date Noted  . Hypokalemia 06/27/2018  . Overweight (BMI 25.0-29.9) 06/27/2018  . S/P right TKA 06/26/2018  . Autoimmune disease ([place]) 03/15/2016  . High risk medication use 03/15/2016  . Pain in joint of right shoulder 03/15/2016  . Bilateral hip pain 03/15/2016  . Primary osteoarthritis of both knees 03/15/2016  . 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 Arthritis 02/24/2014  . HTN (hypertension) 02/24/2014  . Hyperlipidemia 02/24/2014  . Breast cancer, right, upper outer quadrant  07/05/2010   Ruben Im, PT 08/02/18 12:22 PM Phone: [phone] Fax: [phone] Alvera Singh 08/02/2018, 12:21 PM  [facility] [address], [place], [place], [address] Phone: [phone]   Fax:  [phone]  Name: Yolanda Peters MRN: [id number] Date of Birth: Dec 13, 1940

## 2018-08-06 ENCOUNTER — Other Ambulatory Visit: Payer: Self-pay

## 2018-08-06 ENCOUNTER — Ambulatory Visit: Payer: Medicare Other | Admitting: Physical Therapy

## 2018-08-06 ENCOUNTER — Encounter: Payer: Self-pay | Admitting: Physical Therapy

## 2018-08-06 DIAGNOSIS — M25661 Stiffness of right knee, not elsewhere classified: Secondary | ICD-10-CM

## 2018-08-06 DIAGNOSIS — R600 Localized edema: Secondary | ICD-10-CM

## 2018-08-06 DIAGNOSIS — R2689 Other abnormalities of gait and mobility: Secondary | ICD-10-CM

## 2018-08-06 DIAGNOSIS — M25561 Pain in right knee: Secondary | ICD-10-CM

## 2018-08-06 DIAGNOSIS — M6281 Muscle weakness (generalized): Secondary | ICD-10-CM

## 2018-08-06 NOTE — Therapy (Signed)
[facility] [address], [place], [place], [address] Phone: [phone]   Fax:  [phone]  Physical Therapy Treatment  Patient Details  Name: Yolanda Peters MRN: [id number] Date of Birth: 1940-10-15 Referring Provider (PT): Dr. Katherine Roan PA   Encounter Date: 08/06/2018  PT End of Session - 08/06/18 1121    Visit Number  18    Date for PT Re-Evaluation  08/23/18    PT Start Time  1031    PT Stop Time  1125    PT Time Calculation (min)  54 min    Activity Tolerance  Patient tolerated treatment well       Past Medical History:  Diagnosis Date  . Allergy   . Arthritis   . Breast cancer [facility]) 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  . Cancer ([place])    right breast   . Hyperlipidemia    takes Niacin  . Hypertension   . Personal history of radiation therapy 2011   rt breast    Past Surgical History:  Procedure Laterality Date  . BREAST DUCTAL SYSTEM EXCISION     right breast  . BREAST LUMPECTOMY    . BREAST SURGERY  august 2011   right partial mastectomy  . CATARACT EXTRACTION     bilateral  . CESAREAN SECTION    . ELBOW SURGERY     left  . MYOMECTOMY    . TOTAL KNEE ARTHROPLASTY Right 06/26/2018   Procedure: TOTAL KNEE ARTHROPLASTY;  Surgeon: Paralee Cancel, MD;  Location: [facility];  Service: Orthopedics;  Laterality: Right;  70 mins    There were no vitals filed for this visit.  Subjective Assessment - 08/06/18 1037    Subjective  I'm driving now.  I could have driven 2 weeks ago!  I did 100 turns on my cycle at home.    Pertinent History  Right TKR 06/26/18 hx of Breast Cancer;  sees MD 7/29    Currently in Pain?  No/denies    Pain Score  0-No pain    Pain Location  Shoulder    Pain Orientation  Right    Pain Type  Surgical pain    Pain Frequency  Intermittent                       [facility] Adult PT Treatment/Exercise - 08/06/18 0001      Therapeutic Activites    ADL's  climbing stairs;   stepping over 3 stacked cones to simulate stepping over side of bathtub     Other Therapeutic Activities  standing, walking, sit to stand       Knee/Hip Exercises: Stretches   Passive Hamstring Stretch Limitations  on 2nd step 5x    Other Knee/Hip Stretches  --      Knee/Hip Exercises: Machines for Strengthening   Total Gym Leg Press  Seat 7 65# bil x 20 reps, 35# 20 reps Rt LE only      Knee/Hip Exercises: Standing   Lateral Step Up  Right;15 reps;Hand Hold: 2;Step Height: 4"    Forward Step Up Limitations  stepping up and over 4 inch step with single arm support 5x     Other Standing Knee Exercises  5x stepping up and over 8 inch obstacle on each leg      Knee/Hip Exercises: Seated   Long Arc Quad Limitations  concurrent with NMES 2x10   Other Seated Knee/Hip Exercises  retro stepping 10x  concurrent with ES    Other Seated Knee/Hip Exercises  TKE with green band  15x     Marching Limitations  step taps with left, WB on right concurrent with ES 20x     Sit to Sand  10 reps;without UE support   concurrent with ES     Knee/Hip Exercises: Supine   Straight Leg Raises  AROM;Right;10 reps    Straight Leg Raises Limitations  concurrent with ES 10x      Electrical Stimulation   Electrical Stimulation Location  right Hydrographic surveyor Parameters  38 V 5 on/5 off 15 min     Electrical Stimulation Goals  Strength;Neuromuscular facilitation      Vasopneumatic   Number Minutes Vasopneumatic   10 minutes    Vasopnuematic Location   Knee    Vasopneumatic Pressure  Medium    Vasopneumatic Temperature   3 flakes      Manual Therapy   Joint Mobilization  patellar mobs 4-way; extension mobs grade 2/3     Passive ROM  supine knee flexion  with and without internal and external tibial rotation 3x 10                 PT Short Term Goals - 07/25/18 1035      PT SHORT TERM GOAL #1   Title  be independent in initial HEP     Status  Achieved      PT SHORT TERM GOAL #2   Title  Right knee extension improved to -5 degrees needed for improved standing and walking tolerance    Status  Achieved      PT SHORT TERM GOAL #3   Title  Right knee flexion improved to 100 degrees for greater ease getting in/out of the car    Status  Achieved      PT SHORT TERM GOAL #4   Title  The patient will be able to walk household distances with a cane    Status  Achieved        PT Long Term Goals - 07/27/18 0954      PT LONG TERM GOAL #1   Title  be independent in advanced HEP    Status  On-going      PT LONG TERM GOAL #3   Title  Right knee ROM 3-112 degrees needed with sit to stand and negotiating curbs and stairs    Status  On-going      PT LONG TERM GOAL #4   Title  The patient will have improved right LE strength to grossly 4/5 needed for walking community distances    Status  On-going      PT LONG TERM GOAL #5   Title  The patient will have improved gait speed indicated with Timed up and Go < 20 sec            Plan - 08/06/18 1122    Clinical Impression Statement  Improving quad motor control noted with terminal knee extension especially during and following neuromuscular electrical stimulation.  Decreasing knee edema and soft tissue mobility and skin coloration of lower leg.  Patient does need verbal cues to activate quads before SLS to decrease quad lag.  She also needs cues to flex knee rather than circumducting leg in compensation with stepping over obstacles and stepping up on a step.  Patient reports functional improvement of being able to drive herself now to  appointments.    Examination-Activity Limitations  Bathing;Bed Mobility;Stairs;Locomotion Level;Lift;Stand    Rehab Potential  Good    PT Frequency  3x / week    PT Duration  8 weeks    PT Treatment/Interventions  ADLs/Self Care Home Management;Cryotherapy;Neuromuscular re-education;Therapeutic exercise;Therapeutic activities;Patient/family  education;Manual techniques;Taping;Vasopneumatic Device;Electrical Stimulation;Moist Heat    PT Next Visit Plan  KX modifier;   NMES to quads with exercise;  check ROM and sent MD note;   knee flexion and extension ROM;  progressive weight bearing, emphasis on quad strengthening especially terminal extension;  cues for gait sequence with cane and foot positioning to avoid internal rotation    PT Home Exercise Plan  Access Code: [email or web address]       Patient will benefit from skilled therapeutic intervention in order to improve the following deficits and impairments:  Abnormal gait, Decreased range of motion, Difficulty walking, Decreased activity tolerance, Pain, Decreased strength, Decreased mobility, Increased edema  Visit Diagnosis: 1. Stiffness of right knee, not elsewhere classified   2. Acute pain of right knee   3. Muscle weakness (generalized)   4. Other abnormalities of gait and mobility   5. Localized edema        Problem List Patient Active Problem List   Diagnosis Date Noted  . Hypokalemia 06/27/2018  . Overweight (BMI 25.0-29.9) 06/27/2018  . S/P right TKA 06/26/2018  . Autoimmune disease ([place]) 03/15/2016  . High risk medication use 03/15/2016  . Pain in joint of right shoulder 03/15/2016  . Bilateral hip pain 03/15/2016  . Primary osteoarthritis of both knees 03/15/2016  . 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 Arthritis 02/24/2014  . HTN (hypertension) 02/24/2014  . Hyperlipidemia 02/24/2014  . Breast cancer, right, upper outer quadrant  07/05/2010   Ruben Im, PT 08/06/18 11:38 AM Phone: [phone] Fax: [phone]  Alvera Singh 08/06/2018, 11:37 AM  [facility] [address] [place], [place], [address] Phone: [phone]   Fax:  [phone]  Name: Yolanda Peters MRN: [id number] Date of Birth: 1940-08-02

## 2018-08-07 ENCOUNTER — Encounter: Payer: Self-pay | Admitting: Physical Therapy

## 2018-08-07 ENCOUNTER — Other Ambulatory Visit: Payer: Self-pay

## 2018-08-07 ENCOUNTER — Ambulatory Visit: Payer: Medicare Other | Admitting: Physical Therapy

## 2018-08-07 DIAGNOSIS — R600 Localized edema: Secondary | ICD-10-CM

## 2018-08-07 DIAGNOSIS — R2689 Other abnormalities of gait and mobility: Secondary | ICD-10-CM

## 2018-08-07 DIAGNOSIS — M25661 Stiffness of right knee, not elsewhere classified: Secondary | ICD-10-CM | POA: Diagnosis not present

## 2018-08-07 DIAGNOSIS — M25561 Pain in right knee: Secondary | ICD-10-CM

## 2018-08-07 DIAGNOSIS — M6281 Muscle weakness (generalized): Secondary | ICD-10-CM

## 2018-08-07 NOTE — Therapy (Signed)
[facility] [address] [place], [place], [address] Phone: [phone]   Fax:  [phone]  Physical Therapy Treatment  Patient Details  Name: Yolanda Peters MRN: [id number] Date of Birth: 1940-10-24 Referring Provider (PT): Dr. Katherine Roan PA   Encounter Date: 08/07/2018  PT End of Session - 08/07/18 1122    Visit Number  19    Date for PT Re-Evaluation  08/23/18    PT Start Time  1030    PT Stop Time  1126    PT Time Calculation (min)  56 min    Activity Tolerance  Patient tolerated treatment well       Past Medical History:  Diagnosis Date  . Allergy   . Arthritis   . Breast cancer [facility]) 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  . Cancer ([place])    right breast   . Hyperlipidemia    takes Niacin  . Hypertension   . Personal history of radiation therapy 2011   rt breast    Past Surgical History:  Procedure Laterality Date  . BREAST DUCTAL SYSTEM EXCISION     right breast  . BREAST LUMPECTOMY    . BREAST SURGERY  august 2011   right partial mastectomy  . CATARACT EXTRACTION     bilateral  . CESAREAN SECTION    . ELBOW SURGERY     left  . MYOMECTOMY    . TOTAL KNEE ARTHROPLASTY Right 06/26/2018   Procedure: TOTAL KNEE ARTHROPLASTY;  Surgeon: Paralee Cancel, MD;  Location: [facility];  Service: Orthopedics;  Laterality: Right;  70 mins    There were no vitals filed for this visit.  Subjective Assessment - 08/07/18 1032    Subjective  I did 200 turns on my cycle at home.  Using SPC.  Has been driving some but granddaughter drove her today.    Pertinent History  Right TKR 06/26/18 hx of Breast Cancer;  sees MD 7/29    Currently in Pain?  No/denies    Pain Score  0-No pain    Pain Location  Knee    Pain Orientation  Right         [facility] PT Assessment - 08/07/18 0001      Circumferential Edema   Circumferential - Right  42 cm mid patella;  43 cm (10cm above patella)       AROM   Right Knee Extension  4    sitting lacks 8 degrees of full extension    Right Knee Flexion  120   seated and supine positions                  [facility] Adult PT Treatment/Exercise - 08/07/18 0001      Knee/Hip Exercises: Stretches   Passive Hamstring Stretch Limitations  on 2nd step 5x    Other Knee/Hip Stretches  2nd step rocks 2x 1 minute       Knee/Hip Exercises: Standing   Heel Raises Limitations  rocker board PF and DF ankle  30x     Terminal Knee Extension  Strengthening;Right;15 reps;Theraband    Theraband Level (Terminal Knee Extension)  Level 3 (Green)    Forward Step Up  Right;20 reps;Hand Hold: 2;Step Height: 4"    Step Down  Right;Hand Hold: 2;Step Height: 2"    Rebounder  3 ways 1 minute each     Walking with Sports Cord  25# backwards only 8x emphasizing full knee extension  Knee/Hip Exercises: Seated   Marching Limitations  step taps with left, WB on right concurrent with ES 20x       Knee/Hip Exercises: Supine   Other Supine Knee/Hip Exercises  green ball rolls for knee flexion 2x10      Vasopneumatic   Number Minutes Vasopneumatic   10 minutes    Vasopnuematic Location   Knee    Vasopneumatic Pressure  Medium    Vasopneumatic Temperature   3 flakes      Manual Therapy   Soft tissue mobilization  retrograde massage Rt calf, medial and lateral Rt knee;  LEs elevated    Passive ROM  seated knee flexion  with and without internal and external tibial rotation 3x 10  with overpressure                PT Short Term Goals - 07/25/18 1035      PT SHORT TERM GOAL #1   Title  be independent in initial HEP    Status  Achieved      PT SHORT TERM GOAL #2   Title  Right knee extension improved to -5 degrees needed for improved standing and walking tolerance    Status  Achieved      PT SHORT TERM GOAL #3   Title  Right knee flexion improved to 100 degrees for greater ease getting in/out of the car    Status  Achieved      PT SHORT TERM GOAL #4   Title  The patient  will be able to walk household distances with a cane    Status  Achieved        PT Long Term Goals - 07/27/18 0954      PT LONG TERM GOAL #1   Title  be independent in advanced HEP    Status  On-going      PT LONG TERM GOAL #3   Title  Right knee ROM 3-112 degrees needed with sit to stand and negotiating curbs and stairs    Status  On-going      PT LONG TERM GOAL #4   Title  The patient will have improved right LE strength to grossly 4/5 needed for walking community distances    Status  On-going      PT LONG TERM GOAL #5   Title  The patient will have improved gait speed indicated with Timed up and Go < 20 sec            Plan - 08/07/18 1123    Clinical Impression Statement  The patient has improving knee ROM 4-120 degrees in supine, 8-120 degrees in sitting.  Quad lag persists however much improved over the last 5 days particularly with neuromuscular electric stimulationg to quad muscles.  Decreasing edema overall although lower leg swelling persists but temporarily much improved following vasocompression and elevation.  She is ambulating with a single point cane for short to medium distances in the community.  She would benefit from further strengthening needed to ascend and descend steps reciprocally and to stand and walk for longer periods of time. Therapist closely monitoring and providing verbal cues to activate quads and decrease compensatory strategies.    Examination-Activity Limitations  Bathing;Bed Mobility;Stairs;Locomotion Level;Lift;Stand    Examination-Participation Restrictions  Community Activity;Driving;Shop;Volunteer    PT Frequency  3x / week    PT Duration  8 weeks    PT Treatment/Interventions  ADLs/Self Care Home Management;Cryotherapy;Neuromuscular re-education;Therapeutic exercise;Therapeutic activities;Patient/family education;Manual techniques;Taping;Vasopneumatic Device;Electrical Stimulation;Moist Heat  PT Next Visit Plan  KX modifier;  20th visit  progress note next visit;   NMES to quads as needed;  see how MD appt went;   knee flexion and extension ROM;  progressive weight bearing, emphasis on quad strengthening especially terminal extension;  cues for gait sequence with cane and foot positioning to avoid internal rotation    PT Home Exercise Plan  Access Code: [email or web address]       Patient will benefit from skilled therapeutic intervention in order to improve the following deficits and impairments:  Abnormal gait, Decreased range of motion, Difficulty walking, Decreased activity tolerance, Pain, Decreased strength, Decreased mobility, Increased edema  Visit Diagnosis: 1. Stiffness of right knee, not elsewhere classified   2. Acute pain of right knee   3. Muscle weakness (generalized)   4. Other abnormalities of gait and mobility   5. Localized edema        Problem List Patient Active Problem List   Diagnosis Date Noted  . Hypokalemia 06/27/2018  . Overweight (BMI 25.0-29.9) 06/27/2018  . S/P right TKA 06/26/2018  . Autoimmune disease ([place]) 03/15/2016  . High risk medication use 03/15/2016  . Pain in joint of right shoulder 03/15/2016  . Bilateral hip pain 03/15/2016  . Primary osteoarthritis of both knees 03/15/2016  . 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 Arthritis 02/24/2014  . HTN (hypertension) 02/24/2014  . Hyperlipidemia 02/24/2014  . Breast cancer, right, upper outer quadrant  07/05/2010   Ruben Im, PT 08/07/18 11:35 AM Phone: [phone] Fax: [phone]  Alvera Singh 08/07/2018, 11:32 AM  [facility] [address] [place], [place], [address] Phone: [phone]   Fax:  [phone]  Name: Yolanda Peters MRN: [id number] Date of Birth: 1940-10-09

## 2018-08-08 ENCOUNTER — Encounter: Payer: Medicare Other | Admitting: Physical Therapy

## 2018-08-09 ENCOUNTER — Encounter: Payer: Self-pay | Admitting: Physical Therapy

## 2018-08-09 ENCOUNTER — Ambulatory Visit: Payer: Medicare Other | Admitting: Physical Therapy

## 2018-08-09 ENCOUNTER — Other Ambulatory Visit: Payer: Self-pay

## 2018-08-09 DIAGNOSIS — M6281 Muscle weakness (generalized): Secondary | ICD-10-CM

## 2018-08-09 DIAGNOSIS — R600 Localized edema: Secondary | ICD-10-CM

## 2018-08-09 DIAGNOSIS — R2689 Other abnormalities of gait and mobility: Secondary | ICD-10-CM

## 2018-08-09 DIAGNOSIS — M25661 Stiffness of right knee, not elsewhere classified: Secondary | ICD-10-CM

## 2018-08-09 DIAGNOSIS — M25561 Pain in right knee: Secondary | ICD-10-CM

## 2018-08-09 NOTE — Therapy (Signed)
[facility] [address] [place], [place], [address] Phone: [phone]   Fax:  [phone]  Physical Therapy Treatment  Patient Details  Name: NIL BOLSER MRN: [id number] Date of Birth: Jul 31, 1940 Referring Provider (PT): Dr. Katherine Roan PA  Progress Note Reporting Period 07/18/18 to 08/09/18  See note below for Objective Data and Assessment of Progress/Goals.      Encounter Date: 08/09/2018  PT End of Session - 08/09/18 1216    Visit Number  20    Date for PT Re-Evaluation  08/23/18    PT Start Time  1035    PT Stop Time  1120    PT Time Calculation (min)  45 min    Activity Tolerance  Patient tolerated treatment well       Past Medical History:  Diagnosis Date  . Allergy   . Arthritis   . Breast cancer [facility]) 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  . Cancer ([place])    right breast   . Hyperlipidemia    takes Niacin  . Hypertension   . Personal history of radiation therapy 2011   rt breast    Past Surgical History:  Procedure Laterality Date  . BREAST DUCTAL SYSTEM EXCISION     right breast  . BREAST LUMPECTOMY    . BREAST SURGERY  august 2011   right partial mastectomy  . CATARACT EXTRACTION     bilateral  . CESAREAN SECTION    . ELBOW SURGERY     left  . MYOMECTOMY    . TOTAL KNEE ARTHROPLASTY Right 06/26/2018   Procedure: TOTAL KNEE ARTHROPLASTY;  Surgeon: Paralee Cancel, MD;  [facility];  Service: Orthopedics;  Laterality: Right;  70 mins    There were no vitals filed for this visit.  Subjective Assessment - 08/09/18 1037    Subjective  I did my turns on my home cycle.  Saw the doctor yesterday.  Had x-rays, all OK.  Doctor said to stretch straight and bent every hour.  Said OK to use Network engineer.  The doctor said I could continue to come to PT 3x/week for August.    Pertinent History  Right TKR 06/26/18 hx of Breast Cancer;  sees MD in Sept    Patient Stated Goals  drive a car again;   be walking normal and painfree    Currently in Pain?  No/denies    Pain Score  0-No pain    Pain Location  Knee    Pain Orientation  Right         [facility] PT Assessment - 08/09/18 0001      Circumferential Edema   Circumferential - Right  42 cm mid patella;  43 cm (10cm above patella)       AROM   Right Knee Extension  4   sitting lacks 8 degrees of full extension    Right Knee Flexion  120   seated and supine positions     Strength   Right Knee Flexion  4/5    Right Knee Extension  4-/5                   [facility] Adult PT Treatment/Exercise - 08/09/18 0001      Knee/Hip Exercises: Stretches   Passive Hamstring Stretch Limitations  on 2nd step 5x    Other Knee/Hip Stretches  2nd step rocks 2x 1 minute       Knee/Hip Exercises: Standing  Heel Raises Limitations  rocker board PF and DF ankle  30x     Lateral Step Up  Right;15 reps;Hand Hold: 2;Step Height: 4"    Forward Step Up  Right;Hand Hold: 2;Step Height: 4"    Forward Step Up Limitations  with left toe touch to chair 10x     Walking with Sports Cord  25# backwards only 8x emphasizing full knee extension     Other Standing Knee Exercises  step up and over 4 inch step 7x      Knee/Hip Exercises: Seated   Other Seated Knee/Hip Exercises  floor sliders for knee flexion 20x     Sit to Sand  10 reps   holding 5# weight      Knee/Hip Exercises: Supine   Short Arc Quad Sets  Strengthening;Right;15 reps    Short Arc Quad Sets Limitations  3#       Vasopneumatic   Number Minutes Vasopneumatic   10 minutes    Vasopnuematic Location   Knee    Vasopneumatic Pressure  Medium    Vasopneumatic Temperature   3 flakes      Manual Therapy   Joint Mobilization  patellar mobs and extension mobs in supine grade 3/4 30 sec 5x     Soft tissue mobilization  passive HS stretch     Passive ROM  supine knee flexion  with and without internal and external tibial rotation 3x 10 in each position     Other Manual Therapy  knee  flexion with overpressure 5x     Muscle Energy Technique  contract relax HS                PT Short Term Goals - 07/25/18 1035      PT SHORT TERM GOAL #1   Title  be independent in initial HEP    Status  Achieved      PT SHORT TERM GOAL #2   Title  Right knee extension improved to -5 degrees needed for improved standing and walking tolerance    Status  Achieved      PT SHORT TERM GOAL #3   Title  Right knee flexion improved to 100 degrees for greater ease getting in/out of the car    Status  Achieved      PT SHORT TERM GOAL #4   Title  The patient will be able to walk household distances with a cane    Status  Achieved        PT Long Term Goals - 08/09/18 1227      PT LONG TERM GOAL #1   Title  be independent in advanced HEP    Time  8    Period  Weeks    Status  On-going      PT LONG TERM GOAL #2   Title  reduce FOTO to < or = to 27% limitation    Time  8    Period  Weeks    Status  On-going      PT LONG TERM GOAL #3   Title  Right knee ROM 3-112 degrees needed with sit to stand and negotiating curbs and stairs    Time  8    Period  Weeks    Status  On-going      PT LONG TERM GOAL #4   Title  The patient will have improved right LE strength to grossly 4/5 needed for walking community distances    Time  8  Period  Weeks    Status  On-going      PT LONG TERM GOAL #5   Title  The patient will have improved gait speed indicated with Timed up and Go < 20 sec    Time  8    Period  Weeks    Status  On-going      PT LONG TERM GOAL #6   Title  Decreased mid patella circumferential measurement right knee to 47 1/2 cm    Time  8    Period  Weeks    Status  On-going            Plan - 08/09/18 1219    Clinical Impression Statement  The patient continues to make steady progress with knee flexion and extension ROM.  She has good passive knee extension now with significant improvements with edema reduction.  Her active knee extension is limited  secondary to quad motor control/strength deficits.  She lacks strength particularly at the terminal end range of motion.  Moderate verbal cues to emphasize quad muscle activation.  Supervision and at time [facility] for safety with more challenging dynamic standing ex's.  Progressing with rehab goals.    Personal Factors and Comorbidities  Age    Examination-Activity Limitations  Bathing;Bed Mobility;Stairs;Locomotion Level;Lift;Stand    Examination-Participation Restrictions  Community Activity;Driving;Shop;Volunteer    Rehab Potential  Good    PT Frequency  3x / week    PT Duration  8 weeks    PT Treatment/Interventions  ADLs/Self Care Home Management;Cryotherapy;Neuromuscular re-education;Therapeutic exercise;Therapeutic activities;Patient/family education;Manual techniques;Taping;Vasopneumatic Device;Electrical Stimulation;Moist Heat    PT Next Visit Plan  KX modifier; do TUG;  knee flexion and extension ROM;  progressive weight bearing, emphasis on quad strengthening especially terminal extension;  cues for gait sequence with cane and foot positioning to avoid internal rotation    PT Home Exercise Plan  Access Code: [email or web address]       Patient will benefit from skilled therapeutic intervention in order to improve the following deficits and impairments:     Visit Diagnosis: 1. Stiffness of right knee, not elsewhere classified   2. Acute pain of right knee   3. Muscle weakness (generalized)   4. Other abnormalities of gait and mobility   5. Localized edema        Problem List Patient Active Problem List   Diagnosis Date Noted  . Hypokalemia 06/27/2018  . Overweight (BMI 25.0-29.9) 06/27/2018  . S/P right TKA 06/26/2018  . Autoimmune disease ([place]) 03/15/2016  . High risk medication use 03/15/2016  . Pain in joint of right shoulder 03/15/2016  . Bilateral hip pain 03/15/2016  . Primary osteoarthritis of both knees 03/15/2016  . 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 Arthritis  02/24/2014  . HTN (hypertension) 02/24/2014  . Hyperlipidemia 02/24/2014  . Breast cancer, right, upper outer quadrant  07/05/2010   Ruben Im, PT 08/09/18 1:26 PM Phone: [phone] Fax: [phone] Alvera Singh 08/09/2018, 1:20 PM  [facility] [address] [place], [place], [address] Phone: [phone]   Fax:  [phone]  Name: SUE FERNICOLA MRN: [id number] Date of Birth: 16-Feb-1940

## 2018-08-13 ENCOUNTER — Ambulatory Visit: Payer: Medicare Other | Attending: Orthopedic Surgery | Admitting: Physical Therapy

## 2018-08-13 ENCOUNTER — Other Ambulatory Visit: Payer: Self-pay

## 2018-08-13 ENCOUNTER — Encounter: Payer: Self-pay | Admitting: Physical Therapy

## 2018-08-13 DIAGNOSIS — M6281 Muscle weakness (generalized): Secondary | ICD-10-CM | POA: Insufficient documentation

## 2018-08-13 DIAGNOSIS — M25661 Stiffness of right knee, not elsewhere classified: Secondary | ICD-10-CM | POA: Insufficient documentation

## 2018-08-13 DIAGNOSIS — R600 Localized edema: Secondary | ICD-10-CM | POA: Diagnosis present

## 2018-08-13 DIAGNOSIS — R2689 Other abnormalities of gait and mobility: Secondary | ICD-10-CM | POA: Insufficient documentation

## 2018-08-13 DIAGNOSIS — M25561 Pain in right knee: Secondary | ICD-10-CM | POA: Diagnosis present

## 2018-08-13 NOTE — Therapy (Signed)
[facility] [address] [place], [place], [address] Phone: [phone]   Fax:  [phone]  Physical Therapy Treatment  Patient Details  Name: Yolanda Peters MRN: [id number] Date of Birth: 05/04/1940 Referring Provider (PT): Dr. Katherine Roan PA   Encounter Date: 08/13/2018  PT End of Session - 08/13/18 1014    Visit Number  21    Date for PT Re-Evaluation  08/23/18    PT Start Time  1012    PT Stop Time  1115    PT Time Calculation (min)  63 min    Activity Tolerance  Patient tolerated treatment well    Behavior During Therapy  [facility] for tasks assessed/performed       Past Medical History:  Diagnosis Date  . Allergy   . Arthritis   . Breast cancer [facility]) 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  . Cancer ([place])    right breast   . Hyperlipidemia    takes Niacin  . Hypertension   . Personal history of radiation therapy 2011   rt breast    Past Surgical History:  Procedure Laterality Date  . BREAST DUCTAL SYSTEM EXCISION     right breast  . BREAST LUMPECTOMY    . BREAST SURGERY  august 2011   right partial mastectomy  . CATARACT EXTRACTION     bilateral  . CESAREAN SECTION    . ELBOW SURGERY     left  . MYOMECTOMY    . TOTAL KNEE ARTHROPLASTY Right 06/26/2018   Procedure: TOTAL KNEE ARTHROPLASTY;  Surgeon: Paralee Cancel, MD;  [facility];  Service: Orthopedics;  Laterality: Right;  70 mins    There were no vitals filed for this visit.  Subjective Assessment - 08/13/18 1015    Subjective  I feel pretty good this AM. Pt reports knee feels heavy at night.    Pertinent History  Right TKR 06/26/18 hx of Breast Cancer;  sees MD in Sept    Currently in Pain?  No/denies    Multiple Pain Sites  No                       [facility] Adult PT Treatment/Exercise - 08/13/18 0001      Knee/Hip Exercises: Stretches   Knee: Self-Stretch to increase Flexion  --   2nd step: rock x 1 min, then hold end range  30 sec 3x     Knee/Hip Exercises: Aerobic   Nustep  L2 x 5 min with concurrent discussion of status.       Knee/Hip Exercises: Machines for Strengthening   Total Gym Leg Press  seat 6; Bil 65# 10x2 RTLE 35# 2x10 VC for eccentric control      Knee/Hip Exercises: Standing   Forward Step Up  Right;1 set;10 reps;Hand Hold: 2;Step Height: 6"    Walking with Sports Cord  25# backwards only 8x emphasizing full knee extension       Knee/Hip Exercises: Seated   Long Arc Quad  Strengthening;Right;2 sets;10 reps;Weights    Long Arc Quad Weight  2 lbs.      Vasopneumatic   Number Minutes Vasopneumatic   15 minutes    Vasopnuematic Location   Knee    Vasopneumatic Pressure  High    Vasopneumatic Temperature   3 flakes      Manual Therapy   Soft tissue mobilization  Gastroc, medial hamstring/knee  PT Short Term Goals - 07/25/18 1035      PT SHORT TERM GOAL #1   Title  be independent in initial HEP    Status  Achieved      PT SHORT TERM GOAL #2   Title  Right knee extension improved to -5 degrees needed for improved standing and walking tolerance    Status  Achieved      PT SHORT TERM GOAL #3   Title  Right knee flexion improved to 100 degrees for greater ease getting in/out of the car    Status  Achieved      PT SHORT TERM GOAL #4   Title  The patient will be able to walk household distances with a cane    Status  Achieved        PT Long Term Goals - 08/09/18 1227      PT LONG TERM GOAL #1   Title  be independent in advanced HEP    Time  8    Period  Weeks    Status  On-going      PT LONG TERM GOAL #2   Title  reduce FOTO to < or = to 27% limitation    Time  8    Period  Weeks    Status  On-going      PT LONG TERM GOAL #3   Title  Right knee ROM 3-112 degrees needed with sit to stand and negotiating curbs and stairs    Time  8    Period  Weeks    Status  On-going      PT LONG TERM GOAL #4   Title  The patient will have improved right LE  strength to grossly 4/5 needed for walking community distances    Time  8    Period  Weeks    Status  On-going      PT LONG TERM GOAL #5   Title  The patient will have improved gait speed indicated with Timed up and Go < 20 sec    Time  8    Period  Weeks    Status  On-going      PT LONG TERM GOAL #6   Title  Decreased mid patella circumferential measurement right knee to 47 1/2 cm    Time  8    Period  Weeks    Status  On-going            Plan - 08/13/18 1014    Clinical Impression Statement  Pt continues to makes progress as she ambulates into the clinic today carrying her cane with min limp. Pt increased height of step up exercise today without pain and was able to extend her fully with 2# distally. Pt remains with reduced soft tissue mobility in her lower leg.    Personal Factors and Comorbidities  Age    Examination-Activity Limitations  Bathing;Bed Mobility;Stairs;Locomotion Level;Lift;Stand    Examination-Participation Restrictions  Community Activity;Driving;Shop;Volunteer    Stability/Clinical Decision Making  Stable/Uncomplicated    Rehab Potential  Good    PT Frequency  3x / week    PT Duration  8 weeks    PT Treatment/Interventions  ADLs/Self Care Home Management;Cryotherapy;Neuromuscular re-education;Therapeutic exercise;Therapeutic activities;Patient/family education;Manual techniques;Taping;Vasopneumatic Device;Electrical Stimulation;Moist Heat    PT Next Visit Plan  KX modifier; do TUG;  knee flexion and extension ROM;  progressive weight bearing, emphasis on quad strengthening especially terminal extension;  cues for gait sequence with cane and foot positioning to avoid  internal rotation    PT Home Exercise Plan  Access Code: [email or web address]       Patient will benefit from skilled therapeutic intervention in order to improve the following deficits and impairments:  Abnormal gait, Decreased range of motion, Difficulty walking, Decreased activity tolerance, Pain,  Decreased strength, Decreased mobility, Increased edema  Visit Diagnosis: 1. Stiffness of right knee, not elsewhere classified   2. Acute pain of right knee   3. Muscle weakness (generalized)   4. Other abnormalities of gait and mobility   5. Localized edema        Problem List Patient Active Problem List   Diagnosis Date Noted  . Hypokalemia 06/27/2018  . Overweight (BMI 25.0-29.9) 06/27/2018  . S/P right TKA 06/26/2018  . Autoimmune disease ([place]) 03/15/2016  . High risk medication use 03/15/2016  . Pain in joint of right shoulder 03/15/2016  . Bilateral hip pain 03/15/2016  . Primary osteoarthritis of both knees 03/15/2016  . 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 Arthritis 02/24/2014  . HTN (hypertension) 02/24/2014  . Hyperlipidemia 02/24/2014  . Breast cancer, right, upper outer quadrant  07/05/2010    Cheo Selvey, PTA 08/13/2018, 11:05 AM  [facility] [address] [place], [place], [address] Phone: [phone]   Fax:  [phone]  Name: BRITTENEY AYOTTE MRN: [id number] Date of Birth: 12-23-1940

## 2018-08-15 ENCOUNTER — Ambulatory Visit: Payer: Medicare Other | Admitting: Physical Therapy

## 2018-08-15 ENCOUNTER — Other Ambulatory Visit: Payer: Self-pay

## 2018-08-15 ENCOUNTER — Encounter: Payer: Self-pay | Admitting: Physical Therapy

## 2018-08-15 DIAGNOSIS — M25561 Pain in right knee: Secondary | ICD-10-CM

## 2018-08-15 DIAGNOSIS — M25661 Stiffness of right knee, not elsewhere classified: Secondary | ICD-10-CM | POA: Diagnosis not present

## 2018-08-15 DIAGNOSIS — R2689 Other abnormalities of gait and mobility: Secondary | ICD-10-CM

## 2018-08-15 DIAGNOSIS — M6281 Muscle weakness (generalized): Secondary | ICD-10-CM

## 2018-08-15 DIAGNOSIS — R600 Localized edema: Secondary | ICD-10-CM

## 2018-08-15 NOTE — Therapy (Signed)
[facility] [address], [place], [place], [address] Phone: [phone]   Fax:  [phone]  Physical Therapy Treatment  Patient Details  Name: Yolanda Peters MRN: [id number] Date of Birth: 1940/02/06 Referring Provider (PT): Dr. Katherine Roan PA   Encounter Date: 08/15/2018  PT End of Session - 08/15/18 1018    Visit Number  22    Date for PT Re-Evaluation  08/23/18    PT Start Time  1015    PT Stop Time  1115    PT Time Calculation (min)  60 min    Activity Tolerance  Patient tolerated treatment well    Behavior During Therapy  [facility] for tasks assessed/performed       Past Medical History:  Diagnosis Date  . Allergy   . Arthritis   . Breast cancer [facility]) 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  . Cancer ([place])    right breast   . Hyperlipidemia    takes Niacin  . Hypertension   . Personal history of radiation therapy 2011   rt breast    Past Surgical History:  Procedure Laterality Date  . BREAST DUCTAL SYSTEM EXCISION     right breast  . BREAST LUMPECTOMY    . BREAST SURGERY  august 2011   right partial mastectomy  . CATARACT EXTRACTION     bilateral  . CESAREAN SECTION    . ELBOW SURGERY     left  . MYOMECTOMY    . TOTAL KNEE ARTHROPLASTY Right 06/26/2018   Procedure: TOTAL KNEE ARTHROPLASTY;  Surgeon: Paralee Cancel, MD;  Location: [facility];  Service: Orthopedics;  Laterality: Right;  70 mins    There were no vitals filed for this visit.  Subjective Assessment - 08/15/18 1019    Subjective  Sleeping much better last few nights.    Currently in Pain?  No/denies    Multiple Pain Sites  No         [facility] PT Assessment - 08/15/18 0001      Timed Up and Go Test   Normal TUG (seconds)  15                   [facility] Adult PT Treatment/Exercise - 08/15/18 0001      Knee/Hip Exercises: Aerobic   Nustep  L2 x 6 min with concurrent discussion of status.       Knee/Hip Exercises: Machines for  Strengthening   Total Gym Leg Press  Seat 6 Bil: 65# 2x 10 with VC to push slightly more with her RT vs LT; 35# RTLE 15x   VC to not use her back with single leg     Knee/Hip Exercises: Standing   Heel Raises Limitations  rocker board PF and DF ankle  30x     Forward Step Up  Right;1 set;15 reps;Hand Hold: 2;Step Height: 6"    Other Standing Knee Exercises  side steps along the coutertop with red band 6x  UE use       Knee/Hip Exercises: Seated   Long Arc Quad  Strengthening;Right;1 set;10 reps;Weights    Long Arc Quad Weight  --   2.5 # VC to lift over 3 sec, hold at end range     Knee/Hip Exercises: Supine   Bridges  Strengthening;Both;1 set;10 reps      Vasopneumatic   Number Minutes Vasopneumatic   15 minutes    Vasopnuematic Location   Knee    Vasopneumatic Pressure  Medium    Vasopneumatic Temperature   3 flakes      Manual Therapy   Soft tissue mobilization  Gastroc, medial hamstring/knee               PT Short Term Goals - 07/25/18 1035      PT SHORT TERM GOAL #1   Title  be independent in initial HEP    Status  Achieved      PT SHORT TERM GOAL #2   Title  Right knee extension improved to -5 degrees needed for improved standing and walking tolerance    Status  Achieved      PT SHORT TERM GOAL #3   Title  Right knee flexion improved to 100 degrees for greater ease getting in/out of the car    Status  Achieved      PT SHORT TERM GOAL #4   Title  The patient will be able to walk household distances with a cane    Status  Achieved        PT Long Term Goals - 08/15/18 1047      PT LONG TERM GOAL #5   Title  The patient will have improved gait speed indicated with Timed up and Go < 20 sec    Time  8    Period  Weeks    Status  Achieved   15 sec           Plan - 08/15/18 1018    Clinical Impression Statement  Pt ambulates in her home without a device and uses the cane for community distances. She reports today her night time pain is decreased  as of the last 2-3 nights and has only a feeling heaviness to her knee. She demonstartes improved quad strength by being able to step up in the 6 inch step with no pulling of her arms. Pt demonstrates end rage quad weakness. Lower leg improving in skin color and movement of her skin. TUG improved to today to 15 sec. Goal met.    Personal Factors and Comorbidities  Age    Examination-Activity Limitations  Bathing;Bed Mobility;Stairs;Locomotion Level;Lift;Stand    Examination-Participation Restrictions  Community Activity;Driving;Shop;Volunteer    Stability/Clinical Decision Making  Stable/Uncomplicated    Rehab Potential  Good    PT Frequency  3x / week    PT Duration  8 weeks    PT Treatment/Interventions  ADLs/Self Care Home Management;Cryotherapy;Neuromuscular re-education;Therapeutic exercise;Therapeutic activities;Patient/family education;Manual techniques;Taping;Vasopneumatic Device;Electrical Stimulation;Moist Heat    PT Next Visit Plan  Quad strength, end range strength. Take knee extension ROM for goals.    PT Home Exercise Plan  Access Code: [email or web address]    Consulted and Agree with Plan of Care  Patient       Patient will benefit from skilled therapeutic intervention in order to improve the following deficits and impairments:  Abnormal gait, Decreased range of motion, Difficulty walking, Decreased activity tolerance, Pain, Decreased strength, Decreased mobility, Increased edema  Visit Diagnosis: 1. Stiffness of right knee, not elsewhere classified   2. Acute pain of right knee   3. Muscle weakness (generalized)   4. Other abnormalities of gait and mobility   5. Localized edema        Problem List Patient Active Problem List   Diagnosis Date Noted  . Hypokalemia 06/27/2018  . Overweight (BMI 25.0-29.9) 06/27/2018  . S/P right TKA 06/26/2018  . Autoimmune disease ([place]) 03/15/2016  . High risk medication use 03/15/2016  . Pain in joint of right  shoulder 03/15/2016  . Bilateral  hip pain 03/15/2016  . Primary osteoarthritis of both knees 03/15/2016  . 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 Arthritis 02/24/2014  . HTN (hypertension) 02/24/2014  . Hyperlipidemia 02/24/2014  . Breast cancer, right, upper outer quadrant  07/05/2010    Deeandra Jerry, PTA 08/15/2018, 11:05 AM  [facility] [address] [place], [place], [address] Phone: [phone]   Fax:  [phone]  Name: LANDON TRUAX MRN: [id number] Date of Birth: 01-29-1940

## 2018-08-17 ENCOUNTER — Ambulatory Visit: Payer: Medicare Other | Admitting: Physical Therapy

## 2018-08-17 ENCOUNTER — Other Ambulatory Visit: Payer: Self-pay

## 2018-08-17 DIAGNOSIS — M25561 Pain in right knee: Secondary | ICD-10-CM

## 2018-08-17 DIAGNOSIS — R600 Localized edema: Secondary | ICD-10-CM

## 2018-08-17 DIAGNOSIS — R2689 Other abnormalities of gait and mobility: Secondary | ICD-10-CM

## 2018-08-17 DIAGNOSIS — M6281 Muscle weakness (generalized): Secondary | ICD-10-CM

## 2018-08-17 DIAGNOSIS — M25661 Stiffness of right knee, not elsewhere classified: Secondary | ICD-10-CM | POA: Diagnosis not present

## 2018-08-17 NOTE — Therapy (Signed)
[facility] [address] [place], [place], [address] Phone: [phone]   Fax:  [phone]  Physical Therapy Treatment  Patient Details  Name: Yolanda Peters MRN: [id number] Date of Birth: 17-Aug-1940 Referring Provider (PT): Dr. Katherine Roan PA   Encounter Date: 08/17/2018  PT End of Session - 08/17/18 1201    Visit Number  23    Date for PT Re-Evaluation  08/23/18    Authorization Type  [employer] Medicare    PT Start Time  1115    PT Stop Time  1205    PT Time Calculation (min)  50 min    Activity Tolerance  Patient tolerated treatment well       Past Medical History:  Diagnosis Date  . Allergy   . Arthritis   . Breast cancer [facility]) 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  . Cancer ([place])    right breast   . Hyperlipidemia    takes Niacin  . Hypertension   . Personal history of radiation therapy 2011   rt breast    Past Surgical History:  Procedure Laterality Date  . BREAST DUCTAL SYSTEM EXCISION     right breast  . BREAST LUMPECTOMY    . BREAST SURGERY  august 2011   right partial mastectomy  . CATARACT EXTRACTION     bilateral  . CESAREAN SECTION    . ELBOW SURGERY     left  . MYOMECTOMY    . TOTAL KNEE ARTHROPLASTY Right 06/26/2018   Procedure: TOTAL KNEE ARTHROPLASTY;  Surgeon: Paralee Cancel, MD;  Location: [facility];  Service: Orthopedics;  Laterality: Right;  70 mins    There were no vitals filed for this visit.  Subjective Assessment - 08/17/18 1123    Subjective  I am finally able to sleep on my stomach.  Difficulty getting my leg in/out of the car with the seat pulled close.    Pertinent History  Right TKR 06/26/18 hx of Breast Cancer;  sees MD in Sept    Limitations  Walking;House hold activities    Pain Score  0-No pain    Pain Location  Knee    Pain Orientation  Right         [facility] PT Assessment - 08/17/18 0001      AROM   Right Knee Extension  3   seated   Right Knee Flexion  120   seated  and supine positions     Strength   Right Knee Flexion  4/5    Right Knee Extension  4-/5                   [facility] Adult PT Treatment/Exercise - 08/17/18 0001      Therapeutic Activites    ADL's  climbing stairs;  stepping over 3 stacked cones to simulate stepping over side of bathtub     Other Therapeutic Activities  standing, walking, sit to stand       Knee/Hip Exercises: Stretches   Passive Hamstring Stretch Limitations  on 2nd step 5x      Knee/Hip Exercises: Machines for Strengthening   Total Gym Leg Press  seat 7:  70# 15x; 40# 15x   cues for terminal knee extension     Knee/Hip Exercises: Standing   Heel Raises Limitations  rocker board PF and DF ankle  30x     Terminal Knee Extension  Strengthening;Right;15 reps;Theraband    Theraband Level (Terminal Knee Extension)  Level  3 (Green)    Forward Step Up  Right;1 set;15 reps;Hand Hold: 2;Step Height: 6"    Step Down  Right;10 reps;Hand Hold: 2;Step Height: 4"    Other Standing Knee Exercises  5x stepping up and over 8 inch obstacle on each leg    Other Standing Knee Exercises  up/down 4 steps reciprocally with 2 rails      Vasopneumatic   Number Minutes Vasopneumatic   10 minutes    Vasopnuematic Location   Knee    Vasopneumatic Pressure  Medium    Vasopneumatic Temperature   3 flakes      Manual Therapy   Joint Mobilization  patellar mobs and extension mobs in supine grade 3/4 30 sec 5x extension mobs grade 3 3x    Soft tissue mobilization  gastroc    Passive ROM  supine knee flexion  with and without internal and external tibial rotation 3x 10 in each position                PT Short Term Goals - 07/25/18 1035      PT SHORT TERM GOAL #1   Title  be independent in initial HEP    Status  Achieved      PT SHORT TERM GOAL #2   Title  Right knee extension improved to -5 degrees needed for improved standing and walking tolerance    Status  Achieved      PT SHORT TERM GOAL #3   Title  Right  knee flexion improved to 100 degrees for greater ease getting in/out of the car    Status  Achieved      PT SHORT TERM GOAL #4   Title  The patient will be able to walk household distances with a cane    Status  Achieved        PT Long Term Goals - 08/15/18 1047      PT LONG TERM GOAL #5   Title  The patient will have improved gait speed indicated with Timed up and Go < 20 sec    Time  8    Period  Weeks    Status  Achieved   15 sec           Plan - 08/17/18 1201    Clinical Impression Statement  The patient is ambulating in the clinic today without her cane.  Functionally she continues to improve however she reports some difficulty getting her leg in the car with the seat pushed forward (due to lack of knee flexion).  Limited knee flexion also causes her to compensate with stepping over obstacles with circumducting her leg.  The patient continues to require moderate verbal and tactile cues to achieve terminal knee extension.  Decreased quad motor control noted with descending stairs.  Patient compensates by turning sideways.    Examination-Activity Limitations  Bathing;Bed Mobility;Stairs;Locomotion Level;Lift;Stand    Stability/Clinical Decision Making  Stable/Uncomplicated    Rehab Potential  Good    PT Frequency  3x / week    PT Duration  8 weeks    PT Treatment/Interventions  ADLs/Self Care Home Management;Cryotherapy;Neuromuscular re-education;Therapeutic exercise;Therapeutic activities;Patient/family education;Manual techniques;Taping;Vasopneumatic Device;Electrical Stimulation;Moist Heat    PT Next Visit Plan  Quad strength especially end range strength;  increase knee flexion > 120 degrees;  reciprocal stairs    PT Home Exercise Plan  Access Code: [email or web address]       Patient will benefit from skilled therapeutic intervention in order to improve the following deficits and impairments:  Abnormal gait, Decreased range of motion, Difficulty walking, Decreased activity  tolerance, Pain, Decreased strength, Decreased mobility, Increased edema  Visit Diagnosis: 1. Stiffness of right knee, not elsewhere classified   2. Acute pain of right knee   3. Muscle weakness (generalized)   4. Other abnormalities of gait and mobility   5. Localized edema        Problem List Patient Active Problem List   Diagnosis Date Noted  . Hypokalemia 06/27/2018  . Overweight (BMI 25.0-29.9) 06/27/2018  . S/P right TKA 06/26/2018  . Autoimmune disease ([place]) 03/15/2016  . High risk medication use 03/15/2016  . Pain in joint of right shoulder 03/15/2016  . Bilateral hip pain 03/15/2016  . Primary osteoarthritis of both knees 03/15/2016  . 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 Arthritis 02/24/2014  . HTN (hypertension) 02/24/2014  . Hyperlipidemia 02/24/2014  . Breast cancer, right, upper outer quadrant  07/05/2010   Ruben Im, PT 08/17/18 12:16 PM Phone: [phone] Fax: [phone] Alvera Singh 08/17/2018, 12:16 PM  [facility] [address], [place], [place], [address] Phone: [phone]   Fax:  [phone]  Name: Yolanda Peters MRN: [id number] Date of Birth: 06/06/1940

## 2018-08-20 ENCOUNTER — Ambulatory Visit: Payer: Medicare Other | Admitting: Physical Therapy

## 2018-08-20 ENCOUNTER — Encounter: Payer: Self-pay | Admitting: Physical Therapy

## 2018-08-20 ENCOUNTER — Other Ambulatory Visit: Payer: Self-pay

## 2018-08-20 DIAGNOSIS — M6281 Muscle weakness (generalized): Secondary | ICD-10-CM

## 2018-08-20 DIAGNOSIS — R600 Localized edema: Secondary | ICD-10-CM

## 2018-08-20 DIAGNOSIS — M25561 Pain in right knee: Secondary | ICD-10-CM

## 2018-08-20 DIAGNOSIS — R2689 Other abnormalities of gait and mobility: Secondary | ICD-10-CM

## 2018-08-20 DIAGNOSIS — M25661 Stiffness of right knee, not elsewhere classified: Secondary | ICD-10-CM | POA: Diagnosis not present

## 2018-08-20 NOTE — Therapy (Signed)
[facility] [address], [place], [place], [address] Phone: [phone]   Fax:  [phone]  Physical Therapy Treatment  Patient Details  Name: Yolanda Peters MRN: [id number] Date of Birth: 1940-03-12 Referring Provider (PT): Dr. Katherine Roan PA   Encounter Date: 08/20/2018  PT End of Session - 08/20/18 1106    Visit Number  24    Date for PT Re-Evaluation  08/23/18    Authorization Type  [employer]HC Medicare    PT Start Time  1030    PT Stop Time  1120    PT Time Calculation (min)  50 min    Activity Tolerance  Patient tolerated treatment well    Behavior During Therapy  [facility] for tasks assessed/performed       Past Medical History:  Diagnosis Date  . Allergy   . Arthritis   . Breast cancer [facility]) 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  . Cancer ([place])    right breast   . Hyperlipidemia    takes Niacin  . Hypertension   . Personal history of radiation therapy 2011   rt breast    Past Surgical History:  Procedure Laterality Date  . BREAST DUCTAL SYSTEM EXCISION     right breast  . BREAST LUMPECTOMY    . BREAST SURGERY  august 2011   right partial mastectomy  . CATARACT EXTRACTION     bilateral  . CESAREAN SECTION    . ELBOW SURGERY     left  . MYOMECTOMY    . TOTAL KNEE ARTHROPLASTY Right 06/26/2018   Procedure: TOTAL KNEE ARTHROPLASTY;  Surgeon: Paralee Cancel, MD;  Location: [facility];  Service: Orthopedics;  Laterality: Right;  70 mins    There were no vitals filed for this visit.  Subjective Assessment - 08/20/18 1033    Subjective  I'm walking better every day.  already did 200 cycles on the bike today.  I was able to step in and out of the bathtub.    Pertinent History  Right TKR 06/26/18 hx of Breast Cancer;  sees MD in Sept    How long can you sit comfortably?  no problem    How long can you walk comfortably?  house to car    Diagnostic tests  xray    Patient Stated Goals  drive a car again;  be walking  normal and painfree    Currently in Pain?  Yes    Pain Location  Knee    Pain Orientation  Right;Medial    Pain Descriptors / Indicators  Aching;Tightness    Pain Type  Surgical pain    Pain Onset  More than a month ago    Aggravating Factors   walking, bending knee    Pain Relieving Factors  ice, rest                       [facility] Adult PT Treatment/Exercise - 08/20/18 0001      Knee/Hip Exercises: Machines for Strengthening   Total Gym Leg Press  seat 7, 70# bil x 20, 40# Rt LE only x 15      Knee/Hip Exercises: Standing   Terminal Knee Extension  Strengthening;Right;15 reps;1 set    Theraband Level (Terminal Knee Extension)  Level 3 (Green)    Terminal Knee Extension Limitations  Pt needed form cueing several times to avoid hip vs knee motion    Forward Step Up  Right;1 set;15 reps;Hand Hold:  2;Step Height: 6"    Forward Step Up Limitations  VC to use Rt LE more than UEs     Step Down  Right;1 set;10 reps;Hand Hold: 2;Step Height: 6"    Step Down Limitations  VC to control descent    Other Standing Knee Exercises  step over and step across vertical dumbbell approx 8" height, Rt LE, 10 each direction      Knee/Hip Exercises: Seated   Long Arc Quad  AROM;Strengthening;Right;5 sets    [employer] Limitations  with end range pulses x 5 each set      Vasopneumatic   Number Minutes Vasopneumatic   15 minutes    Vasopnuematic Location   Knee    Vasopneumatic Pressure  Medium    Vasopneumatic Temperature   3 flakes               PT Short Term Goals - 07/25/18 1035      PT SHORT TERM GOAL #1   Title  be independent in initial HEP    Status  Achieved      PT SHORT TERM GOAL #2   Title  Right knee extension improved to -5 degrees needed for improved standing and walking tolerance    Status  Achieved      PT SHORT TERM GOAL #3   Title  Right knee flexion improved to 100 degrees for greater ease getting in/out of the car    Status  Achieved      PT  SHORT TERM GOAL #4   Title  The patient will be able to walk household distances with a cane    Status  Achieved        PT Long Term Goals - 08/20/18 1109      PT LONG TERM GOAL #1   Title  be independent in advanced HEP    Status  On-going      PT LONG TERM GOAL #3   Title  Right knee ROM 3-112 degrees needed with sit to stand and negotiating curbs and stairs    Status  On-going            Plan - 08/20/18 1106    Clinical Impression Statement  Pt needed cues to use available knee ROM during gait and continues to display in-toeing which she can prevent for a few steps and returns to habitual pattern.  She circumducts to compensate for forward step overs with Rt LE.  She shows great improvement to exercise tolerance and demo'd improved reciprocal pattern on stairs going up/down with bil handhold.  End range quad cueing was needed by PT and some form cueing was needed for TKEs today.  Pt will continue to benefit from ongoing skilled PT along POC for strength, functional training, gait and stair training.    Rehab Potential  Good    PT Frequency  3x / week    PT Duration  8 weeks    PT Treatment/Interventions  ADLs/Self Care Home Management;Cryotherapy;Neuromuscular re-education;Therapeutic exercise;Therapeutic activities;Patient/family education;Manual techniques;Taping;Vasopneumatic Device;Electrical Stimulation;Moist Heat    PT Next Visit Plan  Quad strength especially end range strength;  increase knee flexion > 120 degrees;  reciprocal stairs    PT Home Exercise Plan  Access Code: [email or web address]    Consulted and Agree with Plan of Care  Patient       Patient will benefit from skilled therapeutic intervention in order to improve the following deficits and impairments:     Visit Diagnosis: 1. Stiffness of  right knee, not elsewhere classified   2. Acute pain of right knee   3. Muscle weakness (generalized)   4. Other abnormalities of gait and mobility   5. Localized edema         Problem List Patient Active Problem List   Diagnosis Date Noted  . Hypokalemia 06/27/2018  . Overweight (BMI 25.0-29.9) 06/27/2018  . S/P right TKA 06/26/2018  . Autoimmune disease ([place]) 03/15/2016  . High risk medication use 03/15/2016  . Pain in joint of right shoulder 03/15/2016  . Bilateral hip pain 03/15/2016  . Primary osteoarthritis of both knees 03/15/2016  . 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 Arthritis 02/24/2014  . HTN (hypertension) 02/24/2014  . Hyperlipidemia 02/24/2014  . Breast cancer, right, upper outer quadrant  07/05/2010    Baruch Merl, PT 08/20/18 11:12 AM   [facility] [address] [place], [place], [address] Phone: [phone]   Fax:  [phone]  Name: Yolanda Peters MRN: [id number] Date of Birth: Oct 19, 1940

## 2018-08-22 ENCOUNTER — Encounter: Payer: Self-pay | Admitting: Physical Therapy

## 2018-08-22 ENCOUNTER — Other Ambulatory Visit: Payer: Self-pay

## 2018-08-22 ENCOUNTER — Ambulatory Visit: Payer: Medicare Other | Admitting: Physical Therapy

## 2018-08-22 DIAGNOSIS — M25661 Stiffness of right knee, not elsewhere classified: Secondary | ICD-10-CM

## 2018-08-22 DIAGNOSIS — R2689 Other abnormalities of gait and mobility: Secondary | ICD-10-CM

## 2018-08-22 DIAGNOSIS — R600 Localized edema: Secondary | ICD-10-CM

## 2018-08-22 DIAGNOSIS — M6281 Muscle weakness (generalized): Secondary | ICD-10-CM

## 2018-08-22 DIAGNOSIS — M25561 Pain in right knee: Secondary | ICD-10-CM

## 2018-08-22 NOTE — Therapy (Signed)
[facility] [address] [place], [place], [address] Phone: [phone]   Fax:  [phone]  Physical Therapy Treatment  Patient Details  Name: Yolanda Peters MRN: [id number] Date of Birth: July 14, 1940 Referring Provider (PT): Dr. Katherine Roan PA   Encounter Date: 08/22/2018  PT End of Session - 08/22/18 1034    Visit Number  25    Date for PT Re-Evaluation  08/23/18    Authorization Type  UHC Medicare    PT Start Time  1029    PT Stop Time  1125    PT Time Calculation (min)  56 min    Equipment Utilized During Treatment  Gait belt    Activity Tolerance  Patient tolerated treatment well    Behavior During Therapy  [facility] for tasks assessed/performed       Past Medical History:  Diagnosis Date  . Allergy   . Arthritis   . Breast cancer [facility]) 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  . Cancer ([place])    right breast   . Hyperlipidemia    takes Niacin  . Hypertension   . Personal history of radiation therapy 2011   rt breast    Past Surgical History:  Procedure Laterality Date  . BREAST DUCTAL SYSTEM EXCISION     right breast  . BREAST LUMPECTOMY    . BREAST SURGERY  august 2011   right partial mastectomy  . CATARACT EXTRACTION     bilateral  . CESAREAN SECTION    . ELBOW SURGERY     left  . MYOMECTOMY    . TOTAL KNEE ARTHROPLASTY Right 06/26/2018   Procedure: TOTAL KNEE ARTHROPLASTY;  Surgeon: Paralee Cancel, MD;  Location: [facility];  Service: Orthopedics;  Laterality: Right;  70 mins    There were no vitals filed for this visit.  Subjective Assessment - 08/22/18 1033    Subjective  Knee pain increased with pressure change on Monday with bad weather but ok today.    Pertinent History  Right TKR 06/26/18 hx of Breast Cancer;  sees MD in Sept    Limitations  Walking;House hold activities    How long can you sit comfortably?  no problem    How long can you walk comfortably?  house to car    Diagnostic tests  xray     Patient Stated Goals  drive a car again;  be walking normal and painfree    Currently in Pain?  Yes    Pain Score  0-No pain    Pain Location  Knee    Pain Orientation  Right;Medial    Pain Descriptors / Indicators  Aching;Tightness    Pain Type  Surgical pain    Pain Onset  More than a month ago    Pain Frequency  Intermittent    Aggravating Factors   walking, bending knee    Pain Relieving Factors  ice, rest                       [facility] Adult PT Treatment/Exercise - 08/22/18 0001      Ambulation/Gait   Pre-Gait Activities  weight shifting and stepping with cue to engage Rt hip, avoid Rt lateral trunk lean x 20 in stagger stance with Rt foot forward      Exercises   Exercises  Knee/Hip;Ankle      Knee/Hip Exercises: Stretches   Active Hamstring Stretch  Right;3 reps;20 seconds    Active Hamstring  Stretch Limitations  foot on first step    Gastroc Stretch  Both;2 reps;30 seconds    Gastroc Stretch Limitations  slant board    Other Knee/Hip Stretches  active knee flexion foot on 2nd step lunge style x20      Knee/Hip Exercises: Aerobic   Nustep  level 3 x 9' seat 10, PT present to discuss HEP and goals      Knee/Hip Exercises: Standing   Heel Raises  Both;20 reps;2 seconds    Heel Raises Limitations  bil hand hold    Hip Abduction  Stengthening;Right;2 sets;10 reps;Knee straight    Abduction Limitations  2#    Hip Extension  Stengthening;Right;Knee straight;2 sets;10 reps    Extension Limitations  2#    Lateral Step Up  Right;Hand Hold: 1;Step Height: 2";10 reps    Lateral Step Up Limitations  5 sec hold    Forward Step Up  Right;1 set;15 reps;Hand Hold: 2;Step Height: 6"    Forward Step Up Limitations  VC to use Rt LE more than UEs       Manual Therapy   Joint Mobilization  patellar mobs 4 way in supine Gr III    Soft tissue mobilization  Rt knee retrograde massage, medial joint line               PT Short Term Goals - 07/25/18 1035      PT  SHORT TERM GOAL #1   Title  be independent in initial HEP    Status  Achieved      PT SHORT TERM GOAL #2   Title  Right knee extension improved to -5 degrees needed for improved standing and walking tolerance    Status  Achieved      PT SHORT TERM GOAL #3   Title  Right knee flexion improved to 100 degrees for greater ease getting in/out of the car    Status  Achieved      PT SHORT TERM GOAL #4   Title  The patient will be able to walk household distances with a cane    Status  Achieved        PT Long Term Goals - 08/22/18 1034      PT LONG TERM GOAL #1   Title  be independent in advanced HEP      PT LONG TERM GOAL #4   Title  The patient will have improved right LE strength to grossly 4/5 needed for walking community distances    Status  On-going            Plan - 08/22/18 1306    Clinical Impression Statement  PT focused on gait and hip strength today along with knee ROM/quad strength.  Pt needed mirror and VC for feedback to avoid Rt lateral trunk lean compensating for Rt glute med weakness in closed chain today.  Pt admits she is not very compliant with HEP so PT emphasized importance of daily ROM/stretching and encouraged her to choose 3-4 strength exercises to cycle through each day for improved compliance.  She will continue to benefit from ongoing PT for strength, ROM and gait/stair training along POC  Re-eval next visit    Rehab Potential  Good    PT Frequency  3x / week    PT Duration  8 weeks    PT Treatment/Interventions  ADLs/Self Care Home Management;Cryotherapy;Neuromuscular re-education;Therapeutic exercise;Therapeutic activities;Patient/family education;Manual techniques;Taping;Vasopneumatic Device;Electrical Stimulation;Moist Heat    PT Next Visit Plan  re-eval next visit, f/u  on HEP compliance, Quad strength especially end range strength;  increase knee flexion > 120 degrees;  reciprocal stairs    PT Home Exercise Plan  Access Code: [email or web address]    Consulted  and Agree with Plan of Care  Patient       Patient will benefit from skilled therapeutic intervention in order to improve the following deficits and impairments:     Visit Diagnosis: 1. Stiffness of right knee, not elsewhere classified   2. Acute pain of right knee   3. Muscle weakness (generalized)   4. Other abnormalities of gait and mobility   5. Localized edema        Problem List Patient Active Problem List   Diagnosis Date Noted  . Hypokalemia 06/27/2018  . Overweight (BMI 25.0-29.9) 06/27/2018  . S/P right TKA 06/26/2018  . Autoimmune disease ([place]) 03/15/2016  . High risk medication use 03/15/2016  . Pain in joint of right shoulder 03/15/2016  . Bilateral hip pain 03/15/2016  . Primary osteoarthritis of both knees 03/15/2016  . 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 Arthritis 02/24/2014  . HTN (hypertension) 02/24/2014  . Hyperlipidemia 02/24/2014  . Breast cancer, right, upper outer quadrant  07/05/2010    Congetta Odriscoll, PT 08/22/18 1:10 PM   [facility] [address] [place], [place], [address] Phone: [phone]   Fax:  [phone]  Name: MADYSUN THALL MRN: [id number] Date of Birth: 1940/05/12

## 2018-08-23 ENCOUNTER — Encounter: Payer: Self-pay | Admitting: Physical Therapy

## 2018-08-23 ENCOUNTER — Ambulatory Visit: Payer: Medicare Other | Admitting: Physical Therapy

## 2018-08-23 ENCOUNTER — Other Ambulatory Visit: Payer: Self-pay

## 2018-08-23 DIAGNOSIS — M6281 Muscle weakness (generalized): Secondary | ICD-10-CM

## 2018-08-23 DIAGNOSIS — M25661 Stiffness of right knee, not elsewhere classified: Secondary | ICD-10-CM

## 2018-08-23 DIAGNOSIS — R600 Localized edema: Secondary | ICD-10-CM

## 2018-08-23 DIAGNOSIS — R2689 Other abnormalities of gait and mobility: Secondary | ICD-10-CM

## 2018-08-23 DIAGNOSIS — M25561 Pain in right knee: Secondary | ICD-10-CM

## 2018-08-23 NOTE — Therapy (Signed)
[facility] [address] [place], [place], [address] Phone: [phone]   Fax:  [phone]  Physical Therapy Treatment/Recertification   Patient Details  Name: Yolanda Peters MRN: [id number] Date of Birth: 06-May-1940 Referring Provider (PT): Dr. Alvan Dame   Encounter Date: 08/23/2018  PT End of Session - 08/23/18 1439    Visit Number  26    Date for PT Re-Evaluation  09/20/18    Authorization Type  UHC Medicare    PT Start Time  1000    PT Stop Time  1045    PT Time Calculation (min)  45 min    Activity Tolerance  Patient tolerated treatment well       Past Medical History:  Diagnosis Date  . Allergy   . Arthritis   . Breast cancer [facility]) 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  . Cancer ([place])    right breast   . Hyperlipidemia    takes Niacin  . Hypertension   . Personal history of radiation therapy 2011   rt breast    Past Surgical History:  Procedure Laterality Date  . BREAST DUCTAL SYSTEM EXCISION     right breast  . BREAST LUMPECTOMY    . BREAST SURGERY  august 2011   right partial mastectomy  . CATARACT EXTRACTION     bilateral  . CESAREAN SECTION    . ELBOW SURGERY     left  . MYOMECTOMY    . TOTAL KNEE ARTHROPLASTY Right 06/26/2018   Procedure: TOTAL KNEE ARTHROPLASTY;  Surgeon: Paralee Cancel, MD;  [facility]: [facility];  Service: Orthopedics;  Laterality: Right;  70 mins    There were no vitals filed for this visit.  Subjective Assessment - 08/23/18 1003    Subjective  I could tell on Monday that it was going to rain.  I've been able to step over the edge of the bathtub.    Pertinent History  Right TKR 06/26/18 hx of Breast Cancer;  sees MD in Sept    Currently in Pain?  Yes    Pain Score  2     Pain Location  Knee    Pain Orientation  Right    Pain Type  Surgical pain         [facility] PT Assessment - 08/23/18 0001      Assessment   Medical Diagnosis  TKR right     Referring Provider (PT)  Dr. Alvan Dame  Rodman Key Babish PA      Observation/Other Assessments   Focus on Therapeutic Outcomes (FOTO)   37% limitation       Observation/Other Assessments-Edema    Edema  Circumferential   44 cm; 45 10cm above patella     AROM   Right Knee Extension  3    Right Knee Flexion  123      Strength   Right Knee Flexion  4+/5    Right Knee Extension  4/5   quad lag with terminal knee extension      Ambulation/Gait   Stairs  Yes    Stairs Assistance  5: Supervision    Stairs Assistance Details (indicate cue type and reason)  needs 2 rails to descend;  1 rail to ascend    Gait Comments  Able to walk short community distances without assistive device;  needs shopping cart for grocery shopping/[employer]      Standardized Balance Assessment   10 Meter Walk  18.34 sec      Timed Up  and Go Test   Normal TUG (seconds)  12   no assistive device                  [facility] Adult PT Treatment/Exercise - 08/23/18 0001      Therapeutic Activites    ADL's  climbing stairs reciprocally up and down;  2 rails and 1 rail on steps;  stepping over objects     Other Therapeutic Activities  standing, walking, sit to stand       Knee/Hip Exercises: Aerobic   Nustep  L3 6 min       Knee/Hip Exercises: Standing   Forward Step Up  Right;Hand Hold: 2;Step Height: 6"    Other Standing Knee Exercises  stepping up and over 6 inch step; 5x    Other Standing Knee Exercises  WB on right with left LE high step touches 10x       Knee/Hip Exercises: Supine   Straight Leg Raises  Right;10 reps    Straight Leg Raises Limitations  emphasis on quad set first       Manual Therapy   Joint Mobilization  seated knee flexion and extension with overpressure at endrange 20x each;  patellar mobs     Soft tissue mobilization  Rt knee retrograde massage, medial joint line               PT Short Term Goals - 08/23/18 1449      PT SHORT TERM GOAL #1   Title  be independent in initial HEP    Status  Achieved       PT SHORT TERM GOAL #2   Title  Right knee extension improved to -5 degrees needed for improved standing and walking tolerance    Status  Achieved      PT SHORT TERM GOAL #3   Title  Right knee flexion improved to 100 degrees for greater ease getting in/out of the car    Status  Achieved      PT SHORT TERM GOAL #4   Title  The patient will be able to walk household distances with a cane    Status  Achieved        PT Long Term Goals - 08/23/18 1030      PT LONG TERM GOAL #1   Title  be independent in advanced HEP    Time  4    Period  Weeks    Status  On-going    Target Date  09/20/18      PT LONG TERM GOAL #2   Title  reduce FOTO to < or = to 27% limitation    Time  4    Period  Weeks    Status  On-going      PT LONG TERM GOAL #3   Title  Right knee ROM 2-126 degrees needed with sit to stand and negotiating curbs and stairs    Time  4    Period  Weeks    Status  Revised      PT LONG TERM GOAL #4   Title  The patient will have improved right LE strength to grossly 4/5 to 4+/5  needed for walking community distances    Time  4    Period  Weeks    Status  Revised      PT LONG TERM GOAL #5   Title  The patient will have improved gait speed indicated with Timed up and Go < 20 sec  Status  Achieved      Additional Long Term Goals   Additional Long Term Goals  Yes      PT LONG TERM GOAL #6   Title  Decreased mid patella circumferential measurement right knee to 47 1/2 cm    Status  Achieved      PT LONG TERM GOAL #7   Title  10 meter walk test improved to 15 sec indicating more normal gait speed    Time  4    Period  Weeks    Status  New      PT LONG TERM GOAL #8   Title  The patient will be able to ambulate 900 feet in 6 minutes indicating improved gait endurance    Time  4    Period  Weeks    Status  New            Plan - 08/23/18 1439    Clinical Impression Statement  The patient is improving with knee ROM 3-123 degrees but has decreased quad  muscle strength which makes it difficult for her to fully straighten her knee.  She has decreased gait speed 3 m/sec (norm 1.67msec).  but is able to ambulate short community distances to PT clinic without an assistive device.  She needs to lean on the shopping cart when she goes to the grocery store.  She has not attempted to walk anywhere close to a mile.  She is able to ascend steps reciprocally with 1 railing but depends on 2 railings to descend secondary to quad muscle weakness.  Her FOTO functional outcome score is much improved but has not met LTG yet.  Much improved knee edema but lower leg edema still present.  The patient is adamant that she continue with PT at a 3x/week frequency since [facility].  She would benefit from PT for further strengthening, gait endurance, edema management and knee ROM.    Personal Factors and Comorbidities  Age    Examination-Activity Limitations  Bathing;Bed Mobility;Stairs;Locomotion Level;Lift;Stand    Examination-Participation Restrictions  Community Activity;Driving;Shop;Volunteer    Rehab Potential  Good    PT Frequency  3x / week    PT Duration  4 weeks    PT Treatment/Interventions  ADLs/Self Care Home Management;Cryotherapy;Neuromuscular re-education;Therapeutic exercise;Therapeutic activities;Patient/family education;Manual techniques;Taping;Vasopneumatic Device;Electrical Stimulation;Moist Heat    PT Next Visit Plan  Do 6 min walk test for baseline;  Quad strength especially end range strength;  increase knee flexion > 125 degrees;  reciprocal stairs    PT Home Exercise Plan  Access Code: [email or web address]       Patient will benefit from skilled therapeutic intervention in order to improve the following deficits and impairments:  Abnormal gait, Decreased range of motion, Difficulty walking, Decreased activity tolerance, Pain, Decreased strength, Decreased mobility, Increased edema  Visit  Diagnosis: 1. Stiffness of right knee, not elsewhere classified   2. Acute pain of right knee   3. Muscle weakness (generalized)   4. Other abnormalities of gait and mobility   5. Localized edema        Problem List Patient Active Problem List   Diagnosis Date Noted  . Hypokalemia 06/27/2018  . Overweight (BMI 25.0-29.9) 06/27/2018  . S/P right TKA 06/26/2018  . [place] 03/15[place]2016  . High risk medication use 03/15/2016  . Pain in joint of right shoulder 03/15/2016  . Bilateral hip pain 03/15/2016  . Primary  osteoarthritis of both knees 03/15/2016  . 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 Arthritis 02/24/2014  . HTN (hypertension) 02/24/2014  . Hyperlipidemia 02/24/2014  . Breast cancer, right, upper outer quadrant  07/05/2010   Ruben Im, PT 08/23/18 2:56 PM Phone: [phone] Fax: [phone] Alvera Singh 08/23/2018, 2:55 PM  [facility] [address] [place] [address] [place], [place], [address] Phone: [phone]   Fax:  [phone]  Name: Yolanda Peters MRN: [id number] Date of Birth: 06/07/1940

## 2018-08-27 ENCOUNTER — Ambulatory Visit: Payer: Medicare Other | Admitting: Physical Therapy

## 2018-08-27 ENCOUNTER — Other Ambulatory Visit: Payer: Self-pay

## 2018-08-27 ENCOUNTER — Encounter: Payer: Self-pay | Admitting: Physical Therapy

## 2018-08-27 DIAGNOSIS — M25661 Stiffness of right knee, not elsewhere classified: Secondary | ICD-10-CM

## 2018-08-27 DIAGNOSIS — R2689 Other abnormalities of gait and mobility: Secondary | ICD-10-CM

## 2018-08-27 DIAGNOSIS — R600 Localized edema: Secondary | ICD-10-CM

## 2018-08-27 DIAGNOSIS — M6281 Muscle weakness (generalized): Secondary | ICD-10-CM

## 2018-08-27 DIAGNOSIS — M25561 Pain in right knee: Secondary | ICD-10-CM

## 2018-08-27 NOTE — Therapy (Signed)
[facility] [address], [place], [place], [address] Phone: [phone]   Fax:  [phone]  Physical Therapy Treatment  Patient Details  Name: Yolanda Peters MRN: [id number] Date of Birth: [birth year] Referring Provider (PT): Dr. Alvan Dame   Encounter Date: 08/27/2018  PT End of Session - 08/27/18 1105    Visit Number  27    Date for PT Re-Evaluation  09/20/18    Authorization Type  UHC Medicare    PT Start Time  1103    PT Stop Time  1144    PT Time Calculation (min)  41 min    Activity Tolerance  Patient tolerated treatment well    Behavior During Therapy  [facility] for tasks assessed/performed       Past Medical History:  Diagnosis Date  . Allergy   . Arthritis   . Breast cancer [facility]) 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  . Cancer ([place])    right breast   . Hyperlipidemia    takes Niacin  . Hypertension   . Personal history of radiation therapy 2011   rt breast    Past Surgical History:  Procedure Laterality Date  . BREAST DUCTAL SYSTEM EXCISION     right breast  . BREAST LUMPECTOMY    . BREAST SURGERY  august 2011   right partial mastectomy  . CATARACT EXTRACTION     bilateral  . CESAREAN SECTION    . ELBOW SURGERY     left  . MYOMECTOMY    . TOTAL KNEE ARTHROPLASTY Right 06/26/2018   Procedure: TOTAL KNEE ARTHROPLASTY;  Surgeon: Paralee Cancel, MD;  [facility];  Service: Orthopedics;  Laterality: Right;  70 mins    There were no vitals filed for this visit.  Subjective Assessment - 08/27/18 1107    Subjective  I can tell I have arthritis!    Currently in Pain?  Yes   Bil knees 2-3/10 ache   Multiple Pain Sites  No                       [facility] Adult PT Treatment/Exercise - 08/27/18 0001      Knee/Hip Exercises: Stretches   Gastroc Stretch  Both;2 reps;30 seconds    Gastroc Stretch Limitations  slant board      Knee/Hip Exercises: Aerobic   Stationary Bike  L2 RTLE only x 30 sec, rest Bil  1 min, repeat x 6 min      Knee/Hip Exercises: Machines for Strengthening   Total Gym Leg Press  Seat 7: Bil 70# 15x, RTLE 35# 2x15   reduced wt for full TKE, VC eyes closed to "feel."     Knee/Hip Exercises: Standing   Stairs  reciprocol 4x with 1-2 hand rails     Other Standing Knee Exercises  WB on right with left LE high step touches 10x2       Knee/Hip Exercises: Seated   Long Arc Quad  Strengthening;Right;2 sets;10 reps;Weights    Long Arc Quad Weight  2 lbs.    Long Arc Quad Limitations  Vc for [employer]      Knee/Hip Exercises: Supine   Bridges  Strengthening;Both;1 set;10 reps    Bridges Limitations  Vc to contract her glutes RT> LT      Manual Therapy   Soft tissue mobilization  Rt knee retrograde massage, medial joint line               PT Short  Term Goals - 08/23/18 1449      PT SHORT TERM GOAL #1   Title  be independent in initial HEP    Status  Achieved      PT SHORT TERM GOAL #2   Title  Right knee extension improved to -5 degrees needed for improved standing and walking tolerance    Status  Achieved      PT SHORT TERM GOAL #3   Title  Right knee flexion improved to 100 degrees for greater ease getting in/out of the car    Status  Achieved      PT SHORT TERM GOAL #4   Title  The patient will be able to walk household distances with a cane    Status  Achieved        PT Long Term Goals - 08/23/18 1030      PT LONG TERM GOAL #1   Title  be independent in advanced HEP    Time  4    Period  Weeks    Status  On-going    Target Date  09/20/18      PT LONG TERM GOAL #2   Title  reduce FOTO to < or = to 27% limitation    Time  4    Period  Weeks    Status  On-going      PT LONG TERM GOAL #3   Title  Right knee ROM 2-126 degrees needed with sit to stand and negotiating curbs and stairs    Time  4    Period  Weeks    Status  Revised      PT LONG TERM GOAL #4   Title  The patient will have improved right LE strength to grossly 4/5 to 4+/5   needed for walking community distances    Time  4    Period  Weeks    Status  Revised      PT LONG TERM GOAL #5   Title  The patient will have improved gait speed indicated with Timed up and Go < 20 sec    Status  Achieved      Additional Long Term Goals   Additional Long Term Goals  Yes      PT LONG TERM GOAL #6   Title  Decreased mid patella circumferential measurement right knee to 47 1/2 cm    Status  Achieved      PT LONG TERM GOAL #7   Title  10 meter walk test improved to 15 sec indicating more normal gait speed    Time  4    Period  Weeks    Status  New      PT LONG TERM GOAL #8   Title  The patient will be able to ambulate 900 feet in 6 minutes indicating improved gait endurance    Time  4    Period  Weeks    Status  New            Plan - 08/27/18 1105    Clinical Impression Statement  Worked on increasing full knee extension and the strength to achieve. We reduced the weight on the leg press and pt could get a strong quad contraction in full extension. She does show signs of fatigue.    Personal Factors and Comorbidities  Age    Examination-Activity Limitations  Bathing;Bed Mobility;Stairs;Locomotion Level;Lift;Stand    Examination-Participation Restrictions  Community Activity;Driving;Shop;Volunteer    Stability/Clinical Decision Making  Stable/Uncomplicated  Rehab Potential  Good    PT Frequency  3x / week    PT Duration  4 weeks    PT Treatment/Interventions  ADLs/Self Care Home Management;Cryotherapy;Neuromuscular re-education;Therapeutic exercise;Therapeutic activities;Patient/family education;Manual techniques;Taping;Vasopneumatic Device;Electrical Stimulation;Moist Heat    PT Next Visit Plan  Ran out of time for 6 min walk test, quad strength    PT Home Exercise Plan  Access Code: [email or web address]    Consulted and Agree with Plan of Care  Patient       Patient will benefit from skilled therapeutic intervention in order to improve the following deficits  and impairments:  Abnormal gait, Decreased range of motion, Difficulty walking, Decreased activity tolerance, Pain, Decreased strength, Decreased mobility, Increased edema  Visit Diagnosis: 1. Stiffness of right knee, not elsewhere classified   2. Acute pain of right knee   3. Muscle weakness (generalized)   4. Other abnormalities of gait and mobility   5. Localized edema        Problem List Patient Active Problem List   Diagnosis Date Noted  . Hypokalemia 06/27/2018  . Overweight (BMI 25.0-29.9) 06/27/2018  . S/P right TKA 06/26/2018  . Autoimmune disease ([place]) 03/15/2016  . High risk medication use 03/15/2016  . Pain in joint of right shoulder 03/15/2016  . Bilateral hip pain 03/15/2016  . Primary osteoarthritis of both knees 03/15/2016  . 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 Arthritis 02/24/2014  . HTN (hypertension) 02/24/2014  . Hyperlipidemia 02/24/2014  . Breast cancer, right, upper outer quadrant  07/05/2010    Kelcey Wickstrom, PTA 08/27/2018, 11:45 AM  [facility] [address], [place], [place], [address] Phone: [phone]   Fax:  [phone]  Name: Yolanda Peters MRN: [id number] Date of Birth: [birth year]

## 2018-08-29 ENCOUNTER — Ambulatory Visit: Payer: Medicare Other | Admitting: Physical Therapy

## 2018-08-29 ENCOUNTER — Other Ambulatory Visit: Payer: Self-pay

## 2018-08-29 ENCOUNTER — Encounter: Payer: Self-pay | Admitting: Physical Therapy

## 2018-08-29 DIAGNOSIS — R600 Localized edema: Secondary | ICD-10-CM

## 2018-08-29 DIAGNOSIS — M6281 Muscle weakness (generalized): Secondary | ICD-10-CM

## 2018-08-29 DIAGNOSIS — M25661 Stiffness of right knee, not elsewhere classified: Secondary | ICD-10-CM

## 2018-08-29 DIAGNOSIS — M25561 Pain in right knee: Secondary | ICD-10-CM

## 2018-08-29 DIAGNOSIS — R2689 Other abnormalities of gait and mobility: Secondary | ICD-10-CM

## 2018-08-29 NOTE — Therapy (Signed)
[facility] [address] [place], [place], [address] Phone: [phone]   Fax:  [phone]  Physical Therapy Treatment  Patient Details  Name: AROUSH CHASSE MRN: [id number] Date of Birth: [birth year] Referring Provider (PT): Dr. Alvan Dame   Encounter Date: 08/29/2018  PT End of Session - 08/29/18 1104    Visit Number  28    Date for PT Re-Evaluation  09/20/18    Authorization Type  UHC Medicare    PT Start Time  1102    PT Stop Time  1141    PT Time Calculation (min)  39 min    Activity Tolerance  Patient tolerated treatment well    Behavior During Therapy  [facility] for tasks assessed/performed       Past Medical History:  Diagnosis Date  . Allergy   . Arthritis   . Breast cancer [facility]) 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  . Cancer ([place])    right breast   . Hyperlipidemia    takes Niacin  . Hypertension   . Personal history of radiation therapy 2011   rt breast    Past Surgical History:  Procedure Laterality Date  . BREAST DUCTAL SYSTEM EXCISION     right breast  . BREAST LUMPECTOMY    . BREAST SURGERY  august 2011   right partial mastectomy  . CATARACT EXTRACTION     bilateral  . CESAREAN SECTION    . ELBOW SURGERY     left  . MYOMECTOMY    . TOTAL KNEE ARTHROPLASTY Right 06/26/2018   Procedure: TOTAL KNEE ARTHROPLASTY;  Surgeon: Paralee Cancel, MD;  Location: [facility];  Service: Orthopedics;  Laterality: Right;  70 mins    There were no vitals filed for this visit.  Subjective Assessment - 08/29/18 1105    Subjective  Better day than Monday, my knee feels heavy at night    Pertinent History  Right TKR 06/26/18 hx of Breast Cancer;  sees MD in Sept    Currently in Pain?  Yes    Pain Score  2     Pain Location  Knee    Pain Orientation  Right    Pain Descriptors / Indicators  Discomfort    Multiple Pain Sites  No                       [facility] Adult PT Treatment/Exercise - 08/29/18 0001      Knee/Hip  Exercises: Stretches   Gastroc Stretch  Both;2 reps;30 seconds    Gastroc Stretch Limitations  slant board      Knee/Hip Exercises: Aerobic   Stationary Bike  L2 RTLE only x 45 sec, rest Bil LE 45 sec, repeat x 7 min      Knee/Hip Exercises: Machines for Strengthening   Total Gym Leg Press  Seat 7: Bil 75# 15x2, RTLE 35# 2x15   reduced wt for full TKE, VC eyes closed to "feel."     Knee/Hip Exercises: Standing   Forward Step Up  Right;2 sets;10 reps;Hand Hold: 1;Step Height: 6"    Functional Squat  2 sets;10 reps    Functional Squat Limitations  VC to bend the knees at the same time, increase WB into RTLE as pt shifts LT      Knee/Hip Exercises: Seated   Long Arc Quad  Strengthening;Right;2 sets;10 reps;Weights    Long Arc Quad Weight  --   2.5   Long Arc Quad Limitations  Vc  for TKE               PT Short Term Goals - 08/23/18 1449      PT SHORT TERM GOAL #1   Title  be independent in initial HEP    Status  Achieved      PT SHORT TERM GOAL #2   Title  Right knee extension improved to -5 degrees needed for improved standing and walking tolerance    Status  Achieved      PT SHORT TERM GOAL #3   Title  Right knee flexion improved to 100 degrees for greater ease getting in/out of the car    Status  Achieved      PT SHORT TERM GOAL #4   Title  The patient will be able to walk household distances with a cane    Status  Achieved        PT Long Term Goals - 08/23/18 1030      PT LONG TERM GOAL #1   Title  be independent in advanced HEP    Time  4    Period  Weeks    Status  On-going    Target Date  09/20/18      PT LONG TERM GOAL #2   Title  reduce FOTO to < or = to 27% limitation    Time  4    Period  Weeks    Status  On-going      PT LONG TERM GOAL #3   Title  Right knee ROM 2-126 degrees needed with sit to stand and negotiating curbs and stairs    Time  4    Period  Weeks    Status  Revised      PT LONG TERM GOAL #4   Title  The patient will have  improved right LE strength to grossly 4/5 to 4+/5  needed for walking community distances    Time  4    Period  Weeks    Status  Revised      PT LONG TERM GOAL #5   Title  The patient will have improved gait speed indicated with Timed up and Go < 20 sec    Status  Achieved      Additional Long Term Goals   Additional Long Term Goals  Yes      PT LONG TERM GOAL #6   Title  Decreased mid patella circumferential measurement right knee to 47 1/2 cm    Status  Achieved      PT LONG TERM GOAL #7   Title  10 meter walk test improved to 15 sec indicating more normal gait speed    Time  4    Period  Weeks    Status  New      PT LONG TERM GOAL #8   Title  The patient will be able to ambulate 900 feet in 6 minutes indicating improved gait endurance    Time  4    Period  Weeks    Status  New            Plan - 08/29/18 1137    Clinical Impression Statement  Pt has mild pain complaints, reports her knee feels "heavy" at times. We continue to work on [place] quad strength and using her LE symmetrically during transitional activities like sit to stand.    Personal Factors and Comorbidities  Age    Examination-Activity Limitations  Bathing;Bed Mobility;Stairs;Locomotion Level;Lift;Stand    Examination-Participation  Restrictions  Community Activity;Driving;Shop;Volunteer    Stability/Clinical Decision Making  Stable/Uncomplicated    Rehab Potential  Good    PT Frequency  3x / week    PT Duration  4 weeks    PT Treatment/Interventions  ADLs/Self Care Home Management;Cryotherapy;Neuromuscular re-education;Therapeutic exercise;Therapeutic activities;Patient/family education;Manual techniques;Taping;Vasopneumatic Device;Electrical Stimulation;Moist Heat    PT Next Visit Plan  Ran out of time for 6 min walk test, quad strength    PT Home Exercise Plan  Access Code: [email or web address]    Consulted and Agree with Plan of Care  Patient       Patient will benefit from skilled therapeutic intervention in  order to improve the following deficits and impairments:  Abnormal gait, Decreased range of motion, Difficulty walking, Decreased activity tolerance, Pain, Decreased strength, Decreased mobility, Increased edema  Visit Diagnosis: 1. Stiffness of right knee, not elsewhere classified   2. Acute pain of right knee   3. Muscle weakness (generalized)   4. Other abnormalities of gait and mobility   5. Localized edema        Problem List Patient Active Problem List   Diagnosis Date Noted  . Hypokalemia 06/27/2018  . Overweight (BMI 25.0-29.9) 06/27/2018  . S/P right TKA 06/26/2018  . Autoimmune disease ([place]) 03/15/2016  . High risk medication use 03/15/2016  . Pain in joint of right shoulder 03/15/2016  . Bilateral hip pain 03/15/2016  . Primary osteoarthritis of both knees 03/15/2016  . 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 Arthritis 02/24/2014  . HTN (hypertension) 02/24/2014  . Hyperlipidemia 02/24/2014  . Breast cancer, right, upper outer quadrant  07/05/2010    Victoriya Pol, PTA 08/29/2018, 11:42 AM  [facility] [address], [place], [place], [address] Phone: [phone]   Fax:  [phone]  Name: YAMIL DOUGHER MRN: [id number] Date of Birth: 1940/09/28

## 2018-08-30 NOTE — Progress Notes (Signed)
Office Visit Note  Patient: Yolanda Peters             Date of Birth: [birth year]           MRN: [id number]             PCP: Shon Baton, MD Referring: Shon Baton, MD Visit Date: 09/13/2018 Occupation: @GUAROCC @  Subjective:  Osteoarthritis.   History of Present Illness: Yolanda Peters is a 78 y.o. female with history of autoimmune disease and osteoarthritis.  She underwent right total knee replacement on June 26, 2018 by Dr. Alvan Dame.  She states she is generally well and did physical therapy for several weeks.  She has some underlying arthritis in her hands and feet but is not very painful today.  She states her left knee joint is not as painful.  Activities of Daily Living:  Patient reports morning stiffness for 0 none.   Patient Denies nocturnal pain.  Difficulty dressing/grooming: Denies Difficulty climbing stairs: Denies Difficulty getting out of chair: Denies Difficulty using hands for taps, buttons, cutlery, and/or writing: Denies  Review of Systems  Constitutional: Negative for fatigue, night sweats, weight gain and weight loss.  HENT: Negative for mouth sores, trouble swallowing, trouble swallowing, mouth dryness and nose dryness.   Eyes: Negative for pain, redness, visual disturbance and dryness.  Respiratory: Negative for cough, shortness of breath and difficulty breathing.   Cardiovascular: Negative for chest pain, palpitations, hypertension, irregular heartbeat and swelling in legs/feet.  Gastrointestinal: Negative for blood in stool, constipation and diarrhea.  Endocrine: Negative for increased urination.  Genitourinary: Negative for difficulty urinating and vaginal dryness.  Musculoskeletal: Positive for arthralgias and joint pain. Negative for joint swelling, myalgias, muscle weakness, morning stiffness, muscle tenderness and myalgias.  Skin: Negative for color change, rash, hair loss, skin tightness, ulcers and sensitivity to sunlight.  Allergic/Immunologic:  Negative for susceptible to infections.  Neurological: Negative for dizziness, memory loss, night sweats and weakness.  Hematological: Negative for bruising/bleeding tendency and swollen glands.  Psychiatric/Behavioral: Negative for depressed mood and sleep disturbance. The patient is not nervous/anxious.     PMFS History:  Patient Active Problem List   Diagnosis Date Noted  . Hypokalemia 06/27/2018  . Overweight (BMI 25.0-29.9) 06/27/2018  . S/P right TKA 06/26/2018  . Autoimmune disease ([place]) 03/15/2016  . High risk medication use 03/15/2016  . Pain in joint of right shoulder 03/15/2016  . Bilateral hip pain 03/15/2016  . Primary osteoarthritis of both knees 03/15/2016  . 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 Arthritis 02/24/2014  . HTN (hypertension) 02/24/2014  . Hyperlipidemia 02/24/2014  . Breast cancer, right, upper outer quadrant  07/05/2010    Past Medical History:  Diagnosis Date  . Allergy   . Arthritis   . Breast cancer [facility]) 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  . Cancer ([place])    right breast   . Hyperlipidemia    takes Niacin  . Hypertension   . Personal history of radiation therapy 2011   rt breast    Family History  Problem Relation Age of Onset  . Cancer Mother        bladder  . Cancer Father        leukemia  . Cancer Maternal Aunt        breast  . Colon cancer Paternal Aunt   . Colon cancer Cousin   . Esophageal cancer Neg Hx   . Rectal cancer Neg Hx   . Stomach cancer Neg Hx  Past Surgical History:  Procedure Laterality Date  . BREAST DUCTAL SYSTEM EXCISION     right breast  . BREAST LUMPECTOMY    . BREAST SURGERY  august 2011   right partial mastectomy  . CATARACT EXTRACTION     bilateral  . CESAREAN SECTION    . ELBOW SURGERY     left  . JOINT REPLACEMENT    . KNEE ARTHROPLASTY    . MYOMECTOMY    . TOTAL KNEE ARTHROPLASTY Right 06/26/2018   Procedure: TOTAL KNEE ARTHROPLASTY;  Surgeon: Paralee Cancel, MD;  Location: [facility];  Service:  Orthopedics;  Laterality: Right;  70 mins   Social History   Social History Narrative  . Not on file   Immunization History  Administered Date(s) Administered  . Influenza Split 09/24/2013, 09/24/2014  . Tdap 08/28/2014  . Zoster Recombinat (Shingrix) 10/12/2017     Objective: Vital Signs: BP 132/69 (BP Location: Left Arm, Patient Position: Sitting, Cuff Size: Normal)   Pulse 90   Resp 18   Ht 5\' 5"  (1.651 m)   Wt 152 lb 9.6 oz (69.2 kg)   BMI 25.39 kg/m    Physical Exam Vitals signs and nursing note reviewed.  Constitutional:      Appearance: She is well-developed.  HENT:     Head: Normocephalic and atraumatic.  Eyes:     Conjunctiva/sclera: Conjunctivae normal.  Neck:     Musculoskeletal: Normal range of motion.  Cardiovascular:     Rate and Rhythm: Normal rate and regular rhythm.     Heart sounds: Normal heart sounds.  Pulmonary:     Effort: Pulmonary effort is normal.     Breath sounds: Normal breath sounds.  Abdominal:     General: Bowel sounds are normal.     Palpations: Abdomen is soft.  Lymphadenopathy:     Cervical: No cervical adenopathy.  Skin:    General: Skin is warm and dry.     Capillary Refill: Capillary refill takes less than 2 seconds.  Neurological:     Mental Status: She is alert and oriented to person, place, and time.  Psychiatric:        Behavior: Behavior normal.      Musculoskeletal Exam: C-spine was in good range of motion.  Shoulder joints elbow joints wrist joints with good range of motion.  She has PIP and DIP thickening consistent with osteoarthritis.  Her right knee joint is replaced and is in good range of motion.  Left knee joint was in good range of motion without much discomfort.  She has some DIP and PIP thickening.  CDAI Exam: CDAI Score: - Patient Global: -; Provider Global: - Swollen: -; Tender: - Joint Exam   No joint exam has been documented for this visit   There is currently no information documented on the  homunculus. Go to the Rheumatology activity and complete the homunculus joint exam.  Investigation: No additional findings.  Imaging: No results found.  Recent Labs: Lab Results  Component Value Date   WBC 11.4 (H) 06/27/2018   HGB 9.9 (L) 06/27/2018   PLT 233 06/27/2018   NA 139 06/27/2018   K 3.0 (L) 06/27/2018   CL 101 06/27/2018   CO2 25 06/27/2018   GLUCOSE 154 (H) 06/27/2018   BUN 24 (H) 06/27/2018   CREATININE 0.82 06/27/2018   BILITOT 0.9 01/22/2018   ALKPHOS 87 08/17/2016   AST 17 01/22/2018   ALT 18 01/22/2018   PROT 6.0 (L) 01/22/2018   ALBUMIN  4.2 08/17/2016   CALCIUM 7.7 (L) 06/27/2018   GFRAA >60 06/27/2018    Speciality Comments: PL[facility]  Procedures:  No procedures performed Allergies: Penicillins   Assessment / Plan:     Visit Diagnoses: Autoimmune disease ([place]) - +ANA low titer, dsDNA negative, history of inflammatory arthritis.  She is on remission with no active synovitis.  High risk medication use - previously on PLQ 200 mg p.o. daily.  No need for medication use now.  Primary osteoarthritis of both hands-joint protection muscle strengthening was discussed.  Primary osteoarthritis of both knees - With right knee joint valgus deformity.  s/p synvisc bilateral knees 03/2018.  Status post total right knee replacement-she had very good results to knee replacement surgery.  She has good range of motion.  Primary osteoarthritis of both feet-proper fitting shoes were discussed.  History of breast cancer  History of hypertension  History of hyperlipidemia  Orders: No orders of the defined types were placed in this encounter.  No orders of the defined types were placed in this encounter.    Follow-Up Instructions: Return in about 6 months (around 03/13/2019) for Autoimmune disease, Osteoarthritis.   Bo Merino, MD  Note - This record has been created using Editor, commissioning.   Chart creation errors have been sought, but may not always  have been located. Such creation errors do not reflect on  the standard of medical care.

## 2018-08-31 ENCOUNTER — Other Ambulatory Visit: Payer: Self-pay

## 2018-08-31 ENCOUNTER — Encounter: Payer: Self-pay | Admitting: Physical Therapy

## 2018-08-31 ENCOUNTER — Ambulatory Visit: Payer: Medicare Other | Admitting: Physical Therapy

## 2018-08-31 DIAGNOSIS — M25661 Stiffness of right knee, not elsewhere classified: Secondary | ICD-10-CM

## 2018-08-31 DIAGNOSIS — M6281 Muscle weakness (generalized): Secondary | ICD-10-CM

## 2018-08-31 DIAGNOSIS — R2689 Other abnormalities of gait and mobility: Secondary | ICD-10-CM

## 2018-08-31 DIAGNOSIS — R600 Localized edema: Secondary | ICD-10-CM

## 2018-08-31 DIAGNOSIS — M25561 Pain in right knee: Secondary | ICD-10-CM

## 2018-08-31 NOTE — Therapy (Signed)
[facility] [address], [place], [place], [address] Phone: [phone]   Fax:  [phone]  Physical Therapy Treatment/Recertification   Patient Details  Name: Yolanda Peters MRN: [id number] Date of Birth: [birth year] Referring Provider (PT): Dr. Alvan Dame   Encounter Date: 08/31/2018  PT End of Session - 08/31/18 1038    Visit Number  29    Date for PT Re-Evaluation  10/05/18    Authorization Type  UHC Medicare    PT Start Time  1030    PT Stop Time  1115    PT Time Calculation (min)  45 min       Past Medical History:  Diagnosis Date  . Allergy   . Arthritis   . Breast cancer [facility]) 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  . Cancer ([place])    right breast   . Hyperlipidemia    takes Niacin  . Hypertension   . Personal history of radiation therapy 2011   rt breast    Past Surgical History:  Procedure Laterality Date  . BREAST DUCTAL SYSTEM EXCISION     right breast  . BREAST LUMPECTOMY    . BREAST SURGERY  august 2011   right partial mastectomy  . CATARACT EXTRACTION     bilateral  . CESAREAN SECTION    . ELBOW SURGERY     left  . MYOMECTOMY    . TOTAL KNEE ARTHROPLASTY Right 06/26/2018   Procedure: TOTAL KNEE ARTHROPLASTY;  Surgeon: Paralee Cancel, MD;  Location: [facility];  Service: Orthopedics;  Laterality: Right;  70 mins    There were no vitals filed for this visit.  Subjective Assessment - 08/31/18 1523    Subjective  "I can tell it's going to rain--[place]".  Patient expresses interest in aquatic PT.    Pertinent History  Right TKR 06/26/18 hx of Breast Cancer;  sees MD in Sept    Currently in Pain?  Yes    Pain Score  2     Pain Location  Knee    Pain Orientation  Right    Pain Type  Surgical pain         [facility] PT Assessment - 08/31/18 0001      Assessment   Medical Diagnosis  TKR right     Referring Provider (PT)  Dr. Alvan Dame      AROM   Right Knee Extension  2    Right Knee Flexion  125       Strength   Right Knee Flexion  4+/5    Right Knee Extension  4/5   quad lag with terminal knee extension      Timed Up and Go Test   Normal TUG (seconds)  12   no assistive device                  [facility] Adult PT Treatment/Exercise - 08/31/18 0001      Therapeutic Activites    ADL's  climbing stairs reciprocally up and down;  2 rails and 1 rail on steps;  stepping over objects     Other Therapeutic Activities  standing, walking, sit to stand       Knee/Hip Exercises: Stretches   Knee: Self-Stretch to increase Flexion  Right;5 reps    Knee: Self-Stretch Limitations  on 2nd step       Knee/Hip Exercises: Aerobic   Stationary Bike  L3 5 min       Knee/Hip Exercises: Machines for Strengthening  Total Gym Leg Press  seat 7; bil 80# with emphasis on terminal knee extension 15x; right 35# 15x       Knee/Hip Exercises: Standing   Forward Step Up  Right;2 sets;10 reps;Hand Hold: 1;Step Height: 6"    Other Standing Knee Exercises  stepping over 4 inch high pole with focus on avoiding LE circumduction 10x each leg     Other Standing Knee Exercises  ladder walk high step one box at a time       Knee/Hip Exercises: Seated   Other Seated Knee/Hip Exercises  terminal knee extension heel lifts off stool without flexing hip 15-0 degrees 12x      Manual Therapy   Joint Mobilization  seated knee flexion and extension with overpressure at endrange 20x each;  patellar mobs     Soft tissue mobilization  incisional massage                PT Short Term Goals - 08/23/18 1449      PT SHORT TERM GOAL #1   Title  be independent in initial HEP    Status  Achieved      PT SHORT TERM GOAL #2   Title  Right knee extension improved to -5 degrees needed for improved standing and walking tolerance    Status  Achieved      PT SHORT TERM GOAL #3   Title  Right knee flexion improved to 100 degrees for greater ease getting in/out of the car    Status  Achieved      PT SHORT TERM  GOAL #4   Title  The patient will be able to walk household distances with a cane    Status  Achieved        PT Long Term Goals - 08/31/18 1605      PT LONG TERM GOAL #1   Title  be independent in advanced HEP and an appropriate aquatic ex program    Time  5    Period  Weeks    Status  Revised    Target Date  10/05/18      PT LONG TERM GOAL #2   Title  reduce FOTO to < or = to 27% limitation    Time  5    Period  Weeks    Status  New      PT LONG TERM GOAL #3   Title  Right knee ROM 1-128 degrees needed with sit to stand and negotiating curbs and stairs    Time  5    Period  Weeks    Status  Revised      PT LONG TERM GOAL #4   Title  The patient will have improved right LE strength to grossly 4/5 to 4+/5  needed for walking community distances    Time  5    Period  Weeks    Status  On-going      PT LONG TERM GOAL #5   Title  The patient will have improved gait speed indicated with Timed up and Go < 20 sec    Status  Achieved      PT LONG TERM GOAL #6   Title  Decreased mid patella circumferential measurement right knee to 47 1/2 cm    Status  Achieved      PT LONG TERM GOAL #7   Title  10 meter walk test improved to 15 sec indicating more normal gait speed    Time  5  Period  Weeks    Status  On-going      PT LONG TERM GOAL #8   Title  The patient will be able to ambulate 900 feet in 6 minutes indicating improved gait endurance    Time  5    Period  Weeks    Status  On-going            Plan - 08/31/18 1531    Clinical Impression Statement  The patient is progressing with ROM 2-125 degrees in sitting.  She requires frequent verbal cues to focus on terminal knee extension, tending not to fully straighten knee or compensating by using her hip flexors.  Treatment focus also on avoid circumduction when she steps over an object or on/off curb step.  The patient requests aquatic PTand we discussed 3 visits in the pool to establish an appropriate ex program.  A  recertification was done today to add aquatic therapy to the plan of care.  Will encourage a tapering of visits over the next 5 weeks.    Examination-Activity Limitations  Bathing;Bed Mobility;Stairs;Locomotion Level;Lift;Stand    Examination-Participation Restrictions  Community Activity;Driving;Shop;Volunteer    Stability/Clinical Decision Making  Stable/Uncomplicated    Rehab Potential  Good    PT Frequency  2x / week    PT Duration  6 weeks    PT Treatment/Interventions  ADLs/Self Care Home Management;Cryotherapy;Neuromuscular re-education;Therapeutic exercise;Therapeutic activities;Patient/family education;Manual techniques;Taping;Vasopneumatic Device;Electrical Stimulation;Moist Heat;Aquatic Therapy    PT Next Visit Plan  discuss with patient a tapering of PT visits over the coming of weeks:  2x/week for 3 weeks then 1x/week for 2 weeks (last 3 visits in the pool);  Note treatment frequency has been changed to 2x/week from 3x in recert to account for longer length of stay to do aquatic PT;  Will need to cancel some previously scheduled appts;   30th visit progress note next visit; do 6 min walk test;    PT Home Exercise Plan  Access Code: [email or web address]       Patient will benefit from skilled therapeutic intervention in order to improve the following deficits and impairments:  Abnormal gait, Decreased range of motion, Difficulty walking, Decreased activity tolerance, Pain, Decreased strength, Decreased mobility, Increased edema  Visit Diagnosis: Stiffness of right knee, not elsewhere classified - Plan: PT plan of care cert/re-cert  Muscle weakness (generalized) - Plan: PT plan of care cert/re-cert  Acute pain of right knee - Plan: PT plan of care cert/re-cert  Other abnormalities of gait and mobility - Plan: PT plan of care cert/re-cert  Localized edema - Plan: PT plan of care cert/re-cert     Problem List Patient Active Problem List   Diagnosis Date Noted  . Hypokalemia 06/27/2018   . Overweight (BMI 25.0-29.9) 06/27/2018  . S/P right TKA 06/26/2018  . Autoimmune disease ([place]) 03/15/2016  . High risk medication use 03/15/2016  . Pain in joint of right shoulder 03/15/2016  . Bilateral hip pain 03/15/2016  . Primary osteoarthritis of both knees 03/15/2016  . 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 Arthritis 02/24/2014  . HTN (hypertension) 02/24/2014  . Hyperlipidemia 02/24/2014  . Breast cancer, right, upper outer quadrant  07/05/2010   Ruben Im, PT 08/31/18 4:11 PM Phone: [phone] Fax: [phone]  Alvera Singh 08/31/2018, 4:10 PM  [facility] [address], [place], [place], [address] Phone: [phone]   Fax:  [phone]  Name: EVANELL WYNKOOP MRN: [id number] Date of Birth: [birth year]

## 2018-09-03 ENCOUNTER — Encounter: Payer: Self-pay | Admitting: Physical Therapy

## 2018-09-03 ENCOUNTER — Other Ambulatory Visit: Payer: Self-pay

## 2018-09-03 ENCOUNTER — Ambulatory Visit: Payer: Medicare Other | Admitting: Physical Therapy

## 2018-09-03 DIAGNOSIS — M25661 Stiffness of right knee, not elsewhere classified: Secondary | ICD-10-CM

## 2018-09-03 DIAGNOSIS — M6281 Muscle weakness (generalized): Secondary | ICD-10-CM

## 2018-09-03 DIAGNOSIS — M25561 Pain in right knee: Secondary | ICD-10-CM

## 2018-09-03 DIAGNOSIS — R2689 Other abnormalities of gait and mobility: Secondary | ICD-10-CM

## 2018-09-03 NOTE — Therapy (Signed)
[facility] [address], [place], [place], [address] Phone: [phone]   Fax:  [phone]  Physical Therapy Treatment  Patient Details  Name: Yolanda Peters MRN: [id number] Date of Birth: Sep 21, 1940 Referring Provider (PT): Dr. Alvan Dame  Progress Note Reporting Period 06/28/18 to 09/03/18  See note below for Objective Data and Assessment of Progress/Goals.       Encounter Date: 09/03/2018  PT End of Session - 09/03/18 1203    Visit Number  30    Date for PT Re-Evaluation  10/05/18    Authorization Type  UHC Medicare    PT Start Time  1202    PT Stop Time  1245    PT Time Calculation (min)  43 min    Activity Tolerance  Patient tolerated treatment well    Behavior During Therapy  WFL for tasks assessed/performed       Past Medical History:  Diagnosis Date  . Allergy   . Arthritis   . Breast cancer [facility]) 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  . Cancer ([place])    right breast   . Hyperlipidemia    takes Niacin  . Hypertension   . Personal history of radiation therapy 2011   rt breast    Past Surgical History:  Procedure Laterality Date  . BREAST DUCTAL SYSTEM EXCISION     right breast  . BREAST LUMPECTOMY    . BREAST SURGERY  august 2011   right partial mastectomy  . CATARACT EXTRACTION     bilateral  . CESAREAN SECTION    . ELBOW SURGERY     left  . MYOMECTOMY    . TOTAL KNEE ARTHROPLASTY Right 06/26/2018   Procedure: TOTAL KNEE ARTHROPLASTY;  Surgeon: Paralee Cancel, MD;  Location: [facility];  Service: Orthopedics;  Laterality: Right;  70 mins    There were no vitals filed for this visit.  Subjective Assessment - 09/03/18 1250    Subjective  My knee tells me when it's going to rain.  My HEP is boring and too long.    Pertinent History  Right TKR 06/26/18 hx of Breast Cancer;  sees MD in Sept    Limitations  Walking;House hold activities    How long can you sit comfortably?  no problem    How long can you walk  comfortably?  house to car    Diagnostic tests  xray    Patient Stated Goals  drive a car again;  be walking normal and painfree    Currently in Pain?  Yes    Pain Score  2     Pain Location  Knee    Pain Orientation  Right    Pain Descriptors / Indicators  Discomfort    Pain Type  Surgical pain    Pain Onset  More than a month ago    Pain Frequency  Intermittent    Aggravating Factors   walking, bending knee    Pain Relieving Factors  ice, rest         [facility] PT Assessment - 09/03/18 0001      Assessment   Medical Diagnosis  TKR right     Referring Provider (PT)  Dr. Alvan Dame      AROM   Right Knee Extension  2    Right Knee Flexion  125      Strength   Right Knee Flexion  4+/5    Right Knee Extension  4/5   quad lag with terminal  knee extension                   [facility] Adult PT Treatment/Exercise - 09/03/18 0001      Knee/Hip Exercises: Stretches   Knee: Self-Stretch to increase Flexion  Right;5 reps    Knee: Self-Stretch Limitations  on 2nd step       Knee/Hip Exercises: Aerobic   Nustep  L3 x 10', PT present to review HEP and discuss schedule      Knee/Hip Exercises: Standing   Hip Flexion  Knee straight;Both;10 reps    Hip Abduction  Stengthening;Both;10 reps;Knee straight    Hip Extension  Stengthening;Both;10 reps;Knee straight    Forward Step Up  Both;Hand Hold: 1;10 reps;Step Height: 6"      Knee/Hip Exercises: Seated   Long Arc Quad  Strengthening;Right;2 sets;10 reps    Abd/Adduction Limitations  ball squeeze 3x10 sec alt with LAQs for HEP               PT Short Term Goals - 09/03/18 1204      PT SHORT TERM GOAL #1   Title  be independent in initial HEP    Status  Achieved      PT SHORT TERM GOAL #2   Title  Right knee extension improved to -5 degrees needed for improved standing and walking tolerance    Status  Achieved      PT SHORT TERM GOAL #3   Title  Right knee flexion improved to 100 degrees for greater ease getting  in/out of the car    Status  Achieved      PT SHORT TERM GOAL #4   Title  The patient will be able to walk household distances with a cane    Status  Achieved        PT Long Term Goals - 09/03/18 1204      PT LONG TERM GOAL #1   Title  be independent in advanced HEP and an appropriate aquatic ex program    Status  On-going      PT LONG TERM GOAL #2   Title  reduce FOTO to < or = to 27% limitation    Status  On-going      PT LONG TERM GOAL #3   Title  Right knee ROM 1-128 degrees needed with sit to stand and negotiating curbs and stairs    Status  Revised      PT LONG TERM GOAL #4   Title  The patient will have improved right LE strength to grossly 4/5 to 4+/5  needed for walking community distances    Status  On-going      PT LONG TERM GOAL #5   Title  The patient will have improved gait speed indicated with Timed up and Go < 20 sec    Status  Achieved      PT LONG TERM GOAL #7   Title  10 meter walk test improved to 15 sec indicating more normal gait speed    Status  On-going            Plan - 09/03/18 1251    Clinical Impression Statement  PT focused today on revising and streamlining HEP for improved compliance.  Pt was in agreement that reduced exercises focused in positional groups would help.  PT encouraged one set of all exercises twice daily and focused program on hip and knee strength in closed and open chain.  PT discussed ongoing compliance with focused  gait and ice as needed.  Pt was ok with tapering visits but does have some concerns about going her own way for accountability.  REvised HEP should helps iwth this.  Pt will benefit from tweaking of HEP as needed and continued strenth/ROM/gait along POC with anticipated transition to aquatic and d/c PT.    PT Frequency  2x / week    PT Duration  6 weeks    PT Treatment/Interventions  ADLs/Self Care Home Management;Cryotherapy;Neuromuscular re-education;Therapeutic exercise;Therapeutic activities;Patient/family  education;Manual techniques;Taping;Vasopneumatic Device;Electrical Stimulation;Moist Heat;Aquatic Therapy    PT Next Visit Plan  f/u on HEP comliance with revised HEP, do 6 min walk test, continue gait/TKE emphasis, LE strength and knee ROM    PT Home Exercise Plan  Access Code: [email or web address]    Consulted and Agree with Plan of Care  Patient       Patient will benefit from skilled therapeutic intervention in order to improve the following deficits and impairments:     Visit Diagnosis: Stiffness of right knee, not elsewhere classified  Muscle weakness (generalized)  Acute pain of right knee  Other abnormalities of gait and mobility     Problem List Patient Active Problem List   Diagnosis Date Noted  . Hypokalemia 06/27/2018  . Overweight (BMI 25.0-29.9) 06/27/2018  . S/P right TKA 06/26/2018  . Autoimmune disease ([place]) 03/15/2016  . High risk medication use 03/15/2016  . Pain in joint of right shoulder 03/15/2016  . Bilateral hip pain 03/15/2016  . Primary osteoarthritis of both knees 03/15/2016  . 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 Arthritis 02/24/2014  . HTN (hypertension) 02/24/2014  . Hyperlipidemia 02/24/2014  . Breast cancer, right, upper outer quadrant  07/05/2010    Baruch Merl, PT 09/03/18 12:56 PM   [facility] [address], [place], [place], [address] Phone: [phone]   Fax:  [phone]  Name: Yolanda Peters MRN: [id number] Date of Birth: Nov 15, 1940

## 2018-09-05 ENCOUNTER — Other Ambulatory Visit: Payer: Self-pay

## 2018-09-05 ENCOUNTER — Encounter: Payer: Self-pay | Admitting: Physical Therapy

## 2018-09-05 ENCOUNTER — Ambulatory Visit: Payer: Medicare Other | Admitting: Physical Therapy

## 2018-09-05 DIAGNOSIS — R600 Localized edema: Secondary | ICD-10-CM

## 2018-09-05 DIAGNOSIS — M25661 Stiffness of right knee, not elsewhere classified: Secondary | ICD-10-CM | POA: Diagnosis not present

## 2018-09-05 DIAGNOSIS — R2689 Other abnormalities of gait and mobility: Secondary | ICD-10-CM

## 2018-09-05 DIAGNOSIS — M25561 Pain in right knee: Secondary | ICD-10-CM

## 2018-09-05 DIAGNOSIS — M6281 Muscle weakness (generalized): Secondary | ICD-10-CM

## 2018-09-05 NOTE — Therapy (Signed)
[facility] [address] [place], [place], [address] Phone: [phone]   Fax:  [phone]  Physical Therapy Treatment  Patient Details  Name: Yolanda Peters MRN: [id number] Date of Birth: [birth year] Referring Provider (PT): Dr. Alvan Dame   Encounter Date: 09/05/2018  PT End of Session - 09/05/18 1143    Visit Number  31    Date for PT Re-Evaluation  10/05/18    Authorization Type  UHC Medicare    PT Start Time  1143    PT Stop Time  1226    PT Time Calculation (min)  43 min    Activity Tolerance  Patient tolerated treatment well    Behavior During Therapy  [facility] for tasks assessed/performed       Past Medical History:  Diagnosis Date  . Allergy   . Arthritis   . Breast cancer [facility]) 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  . Cancer ([place])    right breast   . Hyperlipidemia    takes Niacin  . Hypertension   . Personal history of radiation therapy 2011   rt breast    Past Surgical History:  Procedure Laterality Date  . BREAST DUCTAL SYSTEM EXCISION     right breast  . BREAST LUMPECTOMY    . BREAST SURGERY  august 2011   right partial mastectomy  . CATARACT EXTRACTION     bilateral  . CESAREAN SECTION    . ELBOW SURGERY     left  . MYOMECTOMY    . TOTAL KNEE ARTHROPLASTY Right 06/26/2018   Procedure: TOTAL KNEE ARTHROPLASTY;  Surgeon: Paralee Cancel, MD;  Location: [facility];  Service: Orthopedics;  Laterality: Right;  70 mins    There were no vitals filed for this visit.  Subjective Assessment - 09/05/18 1144    Subjective  I like my new HEP    Currently in Pain?  Yes    Pain Score  1     Pain Location  Knee    Pain Orientation  Right    Pain Descriptors / Indicators  Dull;Aching    Multiple Pain Sites  No                       [facility] Adult PT Treatment/Exercise - 09/05/18 0001      Knee/Hip Exercises: Stretches   Gastroc Stretch  Both;2 reps;30 seconds      Knee/Hip Exercises: Aerobic   Stationary  Bike  L2 5 min RPMS > 50 throughout    Elliptical  R1 L2 x 2 min      Knee/Hip Exercises: Machines for Strengthening   Cybex Knee Flexion  RTLE 1 plate 10x2    Total Gym Leg Press  seat 7; bil 80# with emphasis on terminal knee extension 15x; right 35# 15x    VC for TKE on single leg     Knee/Hip Exercises: Seated   Long Arc Quad  Strengthening;Right;1 set;15 reps;Weights    Long Arc Quad Weight  2 lbs.      Manual Therapy   Soft tissue mobilization  RT knee mainly posterior/hamstring and lower limb               PT Short Term Goals - 09/03/18 1204      PT SHORT TERM GOAL #1   Title  be independent in initial HEP    Status  Achieved      PT SHORT TERM GOAL #2   Title  Right knee extension improved to -5 degrees needed for improved standing and walking tolerance    Status  Achieved      PT SHORT TERM GOAL #3   Title  Right knee flexion improved to 100 degrees for greater ease getting in/out of the car    Status  Achieved      PT SHORT TERM GOAL #4   Title  The patient will be able to walk household distances with a cane    Status  Achieved        PT Long Term Goals - 09/03/18 1204      PT LONG TERM GOAL #1   Title  be independent in advanced HEP and an appropriate aquatic ex program    Status  On-going      PT LONG TERM GOAL #2   Title  reduce FOTO to < or = to 27% limitation    Status  On-going      PT LONG TERM GOAL #3   Title  Right knee ROM 1-128 degrees needed with sit to stand and negotiating curbs and stairs    Status  Revised      PT LONG TERM GOAL #4   Title  The patient will have improved right LE strength to grossly 4/5 to 4+/5  needed for walking community distances    Status  On-going      PT LONG TERM GOAL #5   Title  The patient will have improved gait speed indicated with Timed up and Go < 20 sec    Status  Achieved      PT LONG TERM GOAL #7   Title  10 meter walk test improved to 15 sec indicating more normal gait speed    Status   On-going            Plan - 09/05/18 1143    Clinical Impression Statement  Pt is enjoying her updated HEP and is compliant in performing it. Today we tried the seated hamstring machine and eliptical trainer. Pt felt her glutes working on the eliptical and was able to demonstrate good control ( with light weight) on the knee flexion machine. Posterior knee soft tissues were tight and loosened nicely with the soft tissue work.    Personal Factors and Comorbidities  Age    Examination-Activity Limitations  Bathing;Bed Mobility;Stairs;Locomotion Level;Lift;Stand    Examination-Participation Restrictions  Community Activity;Driving;Shop;Volunteer    Stability/Clinical Decision Making  Stable/Uncomplicated    Rehab Potential  Good    PT Frequency  2x / week    PT Duration  6 weeks    PT Treatment/Interventions  ADLs/Self Care Home Management;Cryotherapy;Neuromuscular re-education;Therapeutic exercise;Therapeutic activities;Patient/family education;Manual techniques;Taping;Vasopneumatic Device;Electrical Stimulation;Moist Heat;Aquatic Therapy    PT Next Visit Plan  6 min walk test next , hip & knee strength : TKE    PT Home Exercise Plan  Access Code: [email or web address]    Consulted and Agree with Plan of Care  Patient       Patient will benefit from skilled therapeutic intervention in order to improve the following deficits and impairments:  Abnormal gait, Decreased range of motion, Difficulty walking, Decreased activity tolerance, Pain, Decreased strength, Decreased mobility, Increased edema  Visit Diagnosis: Stiffness of right knee, not elsewhere classified  Muscle weakness (generalized)  Acute pain of right knee  Other abnormalities of gait and mobility  Localized edema     Problem List Patient Active Problem List   Diagnosis Date Noted  . Hypokalemia 06/27/2018  . Overweight (  BMI 25.0-29.9) 06/27/2018  . S/P right TKA 06/26/2018  . Autoimmune disease ([place]) 03/15/2016  . High risk  medication use 03/15/2016  . Pain in joint of right shoulder 03/15/2016  . Bilateral hip pain 03/15/2016  . Primary osteoarthritis of both knees 03/15/2016  . 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 Arthritis 02/24/2014  . HTN (hypertension) 02/24/2014  . Hyperlipidemia 02/24/2014  . Breast cancer, right, upper outer quadrant  07/05/2010    Marletta Bousquet, PTA 09/05/2018, 12:31 PM  [facility] [address] [place], [place], [address] Phone: [phone]   Fax:  [phone]  Name: KYLEAH CAROLL MRN: [id number] Date of Birth: 03/20/1940

## 2018-09-06 ENCOUNTER — Other Ambulatory Visit: Payer: Self-pay | Admitting: Internal Medicine

## 2018-09-06 DIAGNOSIS — Z1231 Encounter for screening mammogram for malignant neoplasm of breast: Secondary | ICD-10-CM

## 2018-09-07 ENCOUNTER — Ambulatory Visit: Payer: Medicare Other | Admitting: Physical Therapy

## 2018-09-10 ENCOUNTER — Ambulatory Visit: Payer: Medicare Other | Admitting: Physical Therapy

## 2018-09-10 ENCOUNTER — Other Ambulatory Visit: Payer: Self-pay

## 2018-09-10 ENCOUNTER — Encounter: Payer: Self-pay | Admitting: Physical Therapy

## 2018-09-10 DIAGNOSIS — M6281 Muscle weakness (generalized): Secondary | ICD-10-CM

## 2018-09-10 DIAGNOSIS — R600 Localized edema: Secondary | ICD-10-CM

## 2018-09-10 DIAGNOSIS — M25661 Stiffness of right knee, not elsewhere classified: Secondary | ICD-10-CM

## 2018-09-10 DIAGNOSIS — M25561 Pain in right knee: Secondary | ICD-10-CM

## 2018-09-10 DIAGNOSIS — R2689 Other abnormalities of gait and mobility: Secondary | ICD-10-CM

## 2018-09-10 NOTE — Therapy (Signed)
[facility] [address] [place], [place], [address] Phone: [phone]   Fax:  [phone]  Physical Therapy Treatment  Patient Details  Name: Yolanda Peters MRN: [id number] Date of Birth: [birth year] Referring Provider (PT): Dr. Alvan Dame   Encounter Date: 09/10/2018  PT End of Session - 09/10/18 1058    Visit Number  32    Date for PT Re-Evaluation  10/05/18    Authorization Type  UHC Medicare    PT Start Time  1058    PT Stop Time  1138    PT Time Calculation (min)  40 min    Activity Tolerance  Patient tolerated treatment well    Behavior During Therapy  [facility] for tasks assessed/performed       Past Medical History:  Diagnosis Date  . Allergy   . Arthritis   . Breast cancer [facility]) 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  . Cancer ([place])    right breast   . Hyperlipidemia    takes Niacin  . Hypertension   . Personal history of radiation therapy 2011   rt breast    Past Surgical History:  Procedure Laterality Date  . BREAST DUCTAL SYSTEM EXCISION     right breast  . BREAST LUMPECTOMY    . BREAST SURGERY  august 2011   right partial mastectomy  . CATARACT EXTRACTION     bilateral  . CESAREAN SECTION    . ELBOW SURGERY     left  . MYOMECTOMY    . TOTAL KNEE ARTHROPLASTY Right 06/26/2018   Procedure: TOTAL KNEE ARTHROPLASTY;  Surgeon: Paralee Cancel, MD;  [facility]: [facility];  Service: Orthopedics;  Laterality: Right;  70 mins    There were no vitals filed for this visit.  Subjective Assessment - 09/10/18 1107    Subjective  I am loosing weight! My knees have been very achey last few days.    Pertinent History  Right TKR 06/26/18 hx of Breast Cancer;  sees MD in Sept    Currently in Pain?  Yes    Pain Score  3     Pain Location  Knee    Pain Orientation  Right;Left    Pain Descriptors / Indicators  Aching    Aggravating Factors   Overdoing my walking    Pain Relieving Factors  ice, rest    Multiple Pain Sites  No                        [facility] Adult PT Treatment/Exercise - 09/10/18 0001      Knee/Hip Exercises: Aerobic   Stationary Bike  L2 5 min RPMS > 50 throughout    Elliptical  R1 L3 x 3 min PTA present       Knee/Hip Exercises: Machines for Strengthening   Cybex Knee Extension  1 Plate 2x10, RTLE > Lt    Cybex Knee Flexion  RTLE 1 plate 10x2    Total Gym Leg Press  seat 7: Bil 80# 2x10, RTLE 35# 3x10, VC to contract her quads      Knee/Hip Exercises: Standing   Forward Step Up  Right;Hand Hold: 2   On BOSU 2x10              PT Short Term Goals - 09/03/18 1204      PT SHORT TERM GOAL #1   Title  be independent in initial HEP    Status  Achieved  PT SHORT TERM GOAL #2   Title  Right knee extension improved to -5 degrees needed for improved standing and walking tolerance    Status  Achieved      PT SHORT TERM GOAL #3   Title  Right knee flexion improved to 100 degrees for greater ease getting in/out of the car    Status  Achieved      PT SHORT TERM GOAL #4   Title  The patient will be able to walk household distances with a cane    Status  Achieved        PT Long Term Goals - 09/03/18 1204      PT LONG TERM GOAL #1   Title  be independent in advanced HEP and an appropriate aquatic ex program    Status  On-going      PT LONG TERM GOAL #2   Title  reduce FOTO to < or = to 27% limitation    Status  On-going      PT LONG TERM GOAL #3   Title  Right knee ROM 1-128 degrees needed with sit to stand and negotiating curbs and stairs    Status  Revised      PT LONG TERM GOAL #4   Title  The patient will have improved right LE strength to grossly 4/5 to 4+/5  needed for walking community distances    Status  On-going      PT LONG TERM GOAL #5   Title  The patient will have improved gait speed indicated with Timed up and Go < 20 sec    Status  Achieved      PT LONG TERM GOAL #7   Title  10 meter walk test improved to 15 sec indicating more normal gait  speed    Status  On-going            Plan - 09/10/18 1125    Clinical Impression Statement  Pt reports feeling achey last few days. Believes it could be weather related. Pt working on using the machines in order to transition to the gym and/or pool post DC from therapy. Pt had much difficulty steeping up onto the BOSU and required HHA from PTA.    Personal Factors and Comorbidities  Age    Examination-Activity Limitations  Bathing;Bed Mobility;Stairs;Locomotion Level;Lift;Stand    Examination-Participation Restrictions  Community Activity;Driving;Shop;Volunteer    Stability/Clinical Decision Making  Stable/Uncomplicated    Rehab Potential  Good    PT Frequency  2x / week    PT Duration  6 weeks    PT Treatment/Interventions  ADLs/Self Care Home Management;Cryotherapy;Neuromuscular re-education;Therapeutic exercise;Therapeutic activities;Patient/family education;Manual techniques;Taping;Vasopneumatic Device;Electrical Stimulation;Moist Heat;Aquatic Therapy    PT Next Visit Plan  6 min walk test next , hip & knee strength : TKE    PT Home Exercise Plan  Access Code: [email or web address]    Consulted and Agree with Plan of Care  Patient       Patient will benefit from skilled therapeutic intervention in order to improve the following deficits and impairments:  Abnormal gait, Decreased range of motion, Difficulty walking, Decreased activity tolerance, Pain, Decreased strength, Decreased mobility, Increased edema  Visit Diagnosis: Stiffness of right knee, not elsewhere classified  Muscle weakness (generalized)  Acute pain of right knee  Other abnormalities of gait and mobility  Localized edema     Problem List Patient Active Problem List   Diagnosis Date Noted  . Hypokalemia 06/27/2018  . Overweight (BMI 25.0-29.9) 06/27/2018  .  S/P right TKA 06/26/2018  . Autoimmune disease ([place]) 03/15/2016  . High risk medication use 03/15/2016  . Pain in joint of right shoulder 03/15/2016  .  Bilateral hip pain 03/15/2016  . Primary osteoarthritis of both knees 03/15/2016  . 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 Arthritis 02/24/2014  . HTN (hypertension) 02/24/2014  . Hyperlipidemia 02/24/2014  . Breast cancer, right, upper outer quadrant  07/05/2010    Treyvonne Tata, PTA 09/10/2018, 11:39 AM  [facility] [address] [place], [place], [address] Phone: [phone]   Fax:  [phone]  Name: Yolanda Peters MRN: [id number] Date of Birth: 1940-09-27

## 2018-09-12 ENCOUNTER — Other Ambulatory Visit: Payer: Self-pay

## 2018-09-12 ENCOUNTER — Encounter: Payer: Self-pay | Admitting: Physical Therapy

## 2018-09-12 ENCOUNTER — Ambulatory Visit: Payer: Medicare Other | Attending: Orthopedic Surgery | Admitting: Physical Therapy

## 2018-09-12 DIAGNOSIS — R600 Localized edema: Secondary | ICD-10-CM | POA: Diagnosis present

## 2018-09-12 DIAGNOSIS — M6281 Muscle weakness (generalized): Secondary | ICD-10-CM | POA: Diagnosis present

## 2018-09-12 DIAGNOSIS — M25661 Stiffness of right knee, not elsewhere classified: Secondary | ICD-10-CM | POA: Diagnosis present

## 2018-09-12 DIAGNOSIS — R2689 Other abnormalities of gait and mobility: Secondary | ICD-10-CM | POA: Insufficient documentation

## 2018-09-12 DIAGNOSIS — M25561 Pain in right knee: Secondary | ICD-10-CM | POA: Diagnosis present

## 2018-09-12 NOTE — Therapy (Signed)
[facility] [address], [place], [place], [address] Phone: [phone]   Fax:  [phone]  Physical Therapy Treatment  Patient Details  Name: Yolanda Peters MRN: [id number] Date of Birth: 05/05/1940 Referring Provider (PT): Dr. Alvan Dame   Encounter Date: 09/12/2018  PT End of Session - 09/12/18 1016    Visit Number  33    Date for PT Re-Evaluation  10/05/18    Authorization Type  UHC Medicare    PT Start Time  1013    PT Stop Time  1055    PT Time Calculation (min)  42 min    Activity Tolerance  Patient tolerated treatment well    Behavior During Therapy  [facility] for tasks assessed/performed       Past Medical History:  Diagnosis Date  . Allergy   . Arthritis   . Breast cancer [facility]) 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  . Cancer ([place])    right breast   . Hyperlipidemia    takes Niacin  . Hypertension   . Personal history of radiation therapy 2011   rt breast    Past Surgical History:  Procedure Laterality Date  . BREAST DUCTAL SYSTEM EXCISION     right breast  . BREAST LUMPECTOMY    . BREAST SURGERY  august 2011   right partial mastectomy  . CATARACT EXTRACTION     bilateral  . CESAREAN SECTION    . ELBOW SURGERY     left  . MYOMECTOMY    . TOTAL KNEE ARTHROPLASTY Right 06/26/2018   Procedure: TOTAL KNEE ARTHROPLASTY;  Surgeon: Paralee Cancel, MD;  Location: [facility];  Service: Orthopedics;  Laterality: Right;  70 mins    There were no vitals filed for this visit.  Subjective Assessment - 09/12/18 1017    Subjective  Ached some this AM but I took some Tylenol and it feels much better. I felt good after Mondays session.    Pertinent History  Right TKR 06/26/18 hx of Breast Cancer;  sees MD in Sept    Currently in Pain?  Yes    Pain Score  1     Pain Location  Knee    Pain Orientation  Right    Pain Descriptors / Indicators  Dull    Multiple Pain Sites  No                       [facility] Adult PT  Treatment/Exercise - 09/12/18 0001      Ambulation/Gait   Ambulation/Gait  Yes    Ambulation/Gait Assistance  7: Independent    Ambulation Distance (Feet)  40 Feet   4x   Gait Comments  Worked on increasing speed and bending at her ankle more      Knee/Hip Exercises: Aerobic   Stationary Bike  L2 5 min RPMS > 50 throughout    Elliptical  R1 L3 x 4 min PTA present       Knee/Hip Exercises: Machines for Strengthening   Cybex Knee Extension  1 Plate + 4# weight  579FGE, RTLE > Lt   VC for TKE   Cybex Knee Flexion  RTLE 1 plate + 4# 075-GRM    Total Gym Leg Press  seat 7: Bil 80# 10x, 85# 10x, RTLE 35# 2x15       Knee/Hip Exercises: Seated   Long Arc Quad  Strengthening;Right;1 set;10 reps;Weights    Long Arc Quad Weight  --   2.5  Long [employer] Limitations  2.5 was too heavy, stopped at first set, gave pt for HEP extend her RT knee as far as she can, then take LT foot to help with TKE, hold this as long as she can as she takes her LTLE away. pt correctly performed 3x               PT Short Term Goals - 09/03/18 1204      PT SHORT TERM GOAL #1   Title  be independent in initial HEP    Status  Achieved      PT SHORT TERM GOAL #2   Title  Right knee extension improved to -5 degrees needed for improved standing and walking tolerance    Status  Achieved      PT SHORT TERM GOAL #3   Title  Right knee flexion improved to 100 degrees for greater ease getting in/out of the car    Status  Achieved      PT SHORT TERM GOAL #4   Title  The patient will be able to walk household distances with a cane    Status  Achieved        PT Long Term Goals - 09/03/18 1204      PT LONG TERM GOAL #1   Title  be independent in advanced HEP and an appropriate aquatic ex program    Status  On-going      PT LONG TERM GOAL #2   Title  reduce FOTO to < or = to 27% limitation    Status  On-going      PT LONG TERM GOAL #3   Title  Right knee ROM 1-128 degrees needed with sit to stand and  negotiating curbs and stairs    Status  Revised      PT LONG TERM GOAL #4   Title  The patient will have improved right LE strength to grossly 4/5 to 4+/5  needed for walking community distances    Status  On-going      PT LONG TERM GOAL #5   Title  The patient will have improved gait speed indicated with Timed up and Go < 20 sec    Status  Achieved      PT LONG TERM GOAL #7   Title  10 meter walk test improved to 15 sec indicating more normal gait speed    Status  On-going            Plan - 09/12/18 1025    Clinical Impression Statement  Pt tolerating gym equipment well, no increased pain in her knee. Pt still requires verbal cuing on leg press for TKE. She can do it but trails off if she is not cued. Ended session working on gait speed and better ankle motion( more consistent heel strike and more mid foot motion.) Weakness remains in TKE.    Personal Factors and Comorbidities  Age    Examination-Activity Limitations  Bathing;Bed Mobility;Stairs;Locomotion Level;Lift;Stand    Examination-Participation Restrictions  Community Activity;Driving;Shop;Volunteer    Stability/Clinical Decision Making  Stable/Uncomplicated    Rehab Potential  Good    PT Frequency  2x / week    PT Duration  6 weeks    PT Treatment/Interventions  ADLs/Self Care Home Management;Cryotherapy;Neuromuscular re-education;Therapeutic exercise;Therapeutic activities;Patient/family education;Manual techniques;Taping;Vasopneumatic Device;Electrical Stimulation;Moist Heat;Aquatic Therapy    PT Next Visit Plan  6 min walk test next; encourage speed vs lumbering gait., hip & knee strength : TKE    PT Home  Exercise Plan  Access Code: [email or web address]    Consulted and Agree with Plan of Care  Patient       Patient will benefit from skilled therapeutic intervention in order to improve the following deficits and impairments:  Abnormal gait, Decreased range of motion, Difficulty walking, Decreased activity tolerance, Pain,  Decreased strength, Decreased mobility, Increased edema  Visit Diagnosis: Stiffness of right knee, not elsewhere classified  Muscle weakness (generalized)  Acute pain of right knee  Other abnormalities of gait and mobility  Localized edema     Problem List Patient Active Problem List   Diagnosis Date Noted  . Hypokalemia 06/27/2018  . Overweight (BMI 25.0-29.9) 06/27/2018  . S/P right TKA 06/26/2018  . Autoimmune disease ([place]) 03/15/2016  . High risk medication use 03/15/2016  . Pain in joint of right shoulder 03/15/2016  . Bilateral hip pain 03/15/2016  . Primary osteoarthritis of both knees 03/15/2016  . 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 Arthritis 02/24/2014  . HTN (hypertension) 02/24/2014  . Hyperlipidemia 02/24/2014  . Breast cancer, right, upper outer quadrant  07/05/2010    Inetha Maret , PTA 09/12/2018, 11:01 AM  [facility] [address] [place], [place], [address] Phone: [phone]   Fax:  [phone]  Name: Yolanda Peters MRN: [id number] Date of Birth: [birth year]

## 2018-09-13 ENCOUNTER — Ambulatory Visit (INDEPENDENT_AMBULATORY_CARE_PROVIDER_SITE_OTHER): Payer: Medicare Other | Admitting: Rheumatology

## 2018-09-13 ENCOUNTER — Other Ambulatory Visit: Payer: Self-pay

## 2018-09-13 ENCOUNTER — Encounter: Payer: Self-pay | Admitting: Rheumatology

## 2018-09-13 VITALS — BP 132/69 | HR 90 | Resp 18 | Ht 65.0 in | Wt 152.6 lb

## 2018-09-13 DIAGNOSIS — Z96651 Presence of right artificial knee joint: Secondary | ICD-10-CM

## 2018-09-13 DIAGNOSIS — Z79899 Other long term (current) drug therapy: Secondary | ICD-10-CM

## 2018-09-13 DIAGNOSIS — M170 Bilateral primary osteoarthritis of knee: Secondary | ICD-10-CM

## 2018-09-13 DIAGNOSIS — Z8639 Personal history of other endocrine, nutritional and metabolic disease: Secondary | ICD-10-CM

## 2018-09-13 DIAGNOSIS — Z853 Personal history of malignant neoplasm of breast: Secondary | ICD-10-CM

## 2018-09-13 DIAGNOSIS — M19071 Primary osteoarthritis, right ankle and foot: Secondary | ICD-10-CM

## 2018-09-13 DIAGNOSIS — M359 Systemic involvement of connective tissue, unspecified: Secondary | ICD-10-CM

## 2018-09-13 DIAGNOSIS — M19041 Primary osteoarthritis, right hand: Secondary | ICD-10-CM

## 2018-09-13 DIAGNOSIS — M19042 Primary osteoarthritis, left hand: Secondary | ICD-10-CM

## 2018-09-13 DIAGNOSIS — M19072 Primary osteoarthritis, left ankle and foot: Secondary | ICD-10-CM

## 2018-09-13 DIAGNOSIS — Z8679 Personal history of other diseases of the circulatory system: Secondary | ICD-10-CM

## 2018-09-14 ENCOUNTER — Encounter: Payer: Medicare Other | Admitting: Physical Therapy

## 2018-09-18 ENCOUNTER — Other Ambulatory Visit: Payer: Self-pay

## 2018-09-18 ENCOUNTER — Ambulatory Visit: Payer: Medicare Other | Admitting: Physical Therapy

## 2018-09-18 DIAGNOSIS — R2689 Other abnormalities of gait and mobility: Secondary | ICD-10-CM

## 2018-09-18 DIAGNOSIS — M25561 Pain in right knee: Secondary | ICD-10-CM

## 2018-09-18 DIAGNOSIS — M25661 Stiffness of right knee, not elsewhere classified: Secondary | ICD-10-CM

## 2018-09-18 DIAGNOSIS — R600 Localized edema: Secondary | ICD-10-CM

## 2018-09-18 DIAGNOSIS — M6281 Muscle weakness (generalized): Secondary | ICD-10-CM

## 2018-09-18 NOTE — Therapy (Signed)
[facility] [address] [place], [place], [address] Phone: [phone]   Fax:  [phone]  Physical Therapy Treatment  Patient Details  Name: Yolanda Peters MRN: [id number] Date of Birth: 07/07/1940 Referring Provider (PT): Dr. Alvan Dame   Encounter Date: 09/18/2018  PT End of Session - 09/18/18 1840    Visit Number  34    Date for PT Re-Evaluation  10/05/18    Authorization Type  UHC Medicare    PT Start Time  1515    PT Stop Time  1600    PT Time Calculation (min)  45 min    Activity Tolerance  Patient tolerated treatment well       Past Medical History:  Diagnosis Date  . Allergy   . Arthritis   . Breast cancer [facility]) 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  . Cancer ([place])    right breast   . Hyperlipidemia    takes Niacin  . Hypertension   . Personal history of radiation therapy 2011   rt breast    Past Surgical History:  Procedure Laterality Date  . BREAST DUCTAL SYSTEM EXCISION     right breast  . BREAST LUMPECTOMY    . BREAST SURGERY  august 2011   right partial mastectomy  . CATARACT EXTRACTION     bilateral  . CESAREAN SECTION    . ELBOW SURGERY     left  . JOINT REPLACEMENT    . KNEE ARTHROPLASTY    . MYOMECTOMY    . TOTAL KNEE ARTHROPLASTY Right 06/26/2018   Procedure: TOTAL KNEE ARTHROPLASTY;  Surgeon: Paralee Cancel, MD;  [facility]: [facility];  Service: Orthopedics;  Laterality: Right;  70 mins    There were no vitals filed for this visit.  Subjective Assessment - 09/18/18 1517    Subjective  My arthritis in my hips has been bothering me.  I took something  after lunch.    Pertinent History  Right TKR 06/26/18 hx of Breast Cancer;  sees MD in Sept    Currently in Pain?  Yes    Pain Score  3     Pain Location  Knee    Pain Orientation  Right         [facility] PT Assessment - 09/18/18 0001      Observation/Other Assessments   Focus on Therapeutic Outcomes (FOTO)   32% limitation       AROM   Right Knee  Extension  0    Right Knee Flexion  130      Strength   Overall Strength Comments  Able to rise from standard chair without UE assist.    Right Knee Flexion  4+/5    Right Knee Extension  4+/5      6 minute walk test results    Aerobic Endurance Distance Walked  500    Endurance additional comments  no cane;  slower speed secondary to increasing hip pain left and nonsurgical knee      Standardized Balance Assessment   10 Meter Walk  16      Timed Up and Go Test   Normal TUG (seconds)  12   no assistive device                  [facility] Adult PT Treatment/Exercise - 09/18/18 0001      Therapeutic Activites    ADL's  climbing stairs reciprocally up and down;  2 rails and 1 rail on steps;  stepping over objects     Other Therapeutic Activities  standing, walking, sit to stand       Knee/Hip Exercises: Stretches   Knee: Self-Stretch to increase Flexion  Right;5 reps    Knee: Self-Stretch Limitations  on 2nd step       Knee/Hip Exercises: Aerobic   Other Aerobic  review of parameters for patient to continue at [facility]       Knee/Hip Exercises: Machines for Strengthening   Other Machine  review of parameters to continue machines at [facility]       Knee/Hip Exercises: Seated   Other Seated Knee/Hip Exercises  terminal knee extension heel lifts off stool without flexing hip 15-0 degrees 12x    Sit to Sand  5 reps      Ambulation   Ambulation/Gait Assistance Details  --   walking outdoors on sidewalk no assistive device  600 feet on inclines, declines              PT Short Term Goals - 09/03/18 1204      PT SHORT TERM GOAL #1   Title  be independent in initial HEP    Status  Achieved      PT SHORT TERM GOAL #2   Title  Right knee extension improved to -5 degrees needed for improved standing and walking tolerance    Status  Achieved      PT SHORT TERM GOAL #3   Title  Right knee flexion improved to 100 degrees for greater ease getting in/out of the car     Status  Achieved      PT SHORT TERM GOAL #4   Title  The patient will be able to walk household distances with a cane    Status  Achieved        PT Long Term Goals - 09/18/18 1542      PT LONG TERM GOAL #1   Title  be independent in advanced HEP and an appropriate aquatic ex program    Time  5    Period  Weeks    Status  On-going      PT LONG TERM GOAL #2   Title  reduce FOTO to < or = to 27% limitation    Status  Partially Met      PT LONG TERM GOAL #3   Title  Right knee ROM 1-128 degrees needed with sit to stand and negotiating curbs and stairs    Status  Achieved      PT LONG TERM GOAL #4   Title  The patient will have improved right LE strength to grossly 4/5 to 4+/5  needed for walking community distances    Status  Achieved      PT LONG TERM GOAL #5   Title  The patient will have improved gait speed indicated with Timed up and Go < 20 sec    Status  Achieved      PT LONG TERM GOAL #6   Title  Decreased mid patella circumferential measurement right knee to 47 1/2 cm    Status  Achieved      PT LONG TERM GOAL #7   Title  10 meter walk test improved to 15 sec indicating more normal gait speed    Status  Partially Met      PT LONG TERM GOAL #8   Title  The patient will be able to ambulate 900 feet in 6 minutes indicating improved gait endurance  Baseline  500 feet hip pain    Status  Partially Met            Plan - 09/18/18 1553    Clinical Impression Statement  The patient has improved knee ROM 0-130 degrees.  Quad strength and HS strength 4+/5.  She is able to walk community distances however she reports her nonsurgical knee and hips have been bothering her especially with longer distances.  Improved gait speed with 32mwalk test.  She is able to ascend and descend steps reciprocally with 2 railings.  She is able to rise from a standard chair without UE use.  She will have 3 aquatic PT visits to establish a pool ex program and then will discharge from PT  services.    Examination-Activity Limitations  Bathing;Bed Mobility;Stairs;Locomotion Level;Lift;Stand    Examination-Participation Restrictions  Community Activity;Driving;Shop;Volunteer    Rehab Potential  Good    PT Frequency  2x / week    PT Duration  6 weeks    PT Treatment/Interventions  ADLs/Self Care Home Management;Cryotherapy;Neuromuscular re-education;Therapeutic exercise;Therapeutic activities;Patient/family education;Manual techniques;Taping;Vasopneumatic Device;Electrical Stimulation;Moist Heat;Aquatic Therapy    PT Next Visit Plan  3 aquatic PT visits then discharge from PT    PT Home Exercise Plan  Access Code: [email or web address]       Patient will benefit from skilled therapeutic intervention in order to improve the following deficits and impairments:  Abnormal gait, Decreased range of motion, Difficulty walking, Decreased activity tolerance, Pain, Decreased strength, Decreased mobility, Increased edema  Visit Diagnosis: Stiffness of right knee, not elsewhere classified  Muscle weakness (generalized)  Acute pain of right knee  Other abnormalities of gait and mobility  Localized edema     Problem List Patient Active Problem List   Diagnosis Date Noted  . Hypokalemia 06/27/2018  . Overweight (BMI 25.0-29.9) 06/27/2018  . S/P right TKA 06/26/2018  . Autoimmune disease ([place] 03/15/2016  . High risk medication use 03/15/2016  . Pain in joint of right shoulder 03/15/2016  . Bilateral hip pain 03/15/2016  . Primary osteoarthritis of both knees 03/15/2016  . 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 Arthritis 02/24/2014  . HTN (hypertension) 02/24/2014  . Hyperlipidemia 02/24/2014  . Breast cancer, right, upper outer quadrant  07/05/2010   SRuben Im PT 09/18/18 6:48 PM Phone: [phone]: [phone] Singh9/08/2018, 6:47 PM   [facility] [address] [place]  [address]Phone: [phone]  Fax:  [phone] Name: Yolanda WESSMANMRN: [id number] of Birth: 310-18-1942

## 2018-09-21 ENCOUNTER — Encounter: Payer: Self-pay | Admitting: Physical Therapy

## 2018-09-21 ENCOUNTER — Ambulatory Visit: Payer: Medicare Other | Admitting: Physical Therapy

## 2018-09-21 ENCOUNTER — Other Ambulatory Visit: Payer: Self-pay

## 2018-09-21 DIAGNOSIS — M6281 Muscle weakness (generalized): Secondary | ICD-10-CM

## 2018-09-21 DIAGNOSIS — R600 Localized edema: Secondary | ICD-10-CM

## 2018-09-21 DIAGNOSIS — R2689 Other abnormalities of gait and mobility: Secondary | ICD-10-CM

## 2018-09-21 DIAGNOSIS — M25661 Stiffness of right knee, not elsewhere classified: Secondary | ICD-10-CM | POA: Diagnosis not present

## 2018-09-21 DIAGNOSIS — M25561 Pain in right knee: Secondary | ICD-10-CM

## 2018-09-21 NOTE — Therapy (Signed)
[facility] [address] [place], [place], [address] Phone: [phone]   Fax:  [phone]  Physical Therapy Treatment  Patient Details  Name: Yolanda Peters MRN: [id number] Date of Birth: [birth year] Referring Provider (PT): Dr. Alvan Dame   Encounter Date: 09/21/2018  PT End of Session - 09/21/18 1639    Visit Number  35    Date for PT Re-Evaluation  10/05/18    Authorization Type  UHC Medicare    PT Start Time  1430    PT Stop Time  1510    PT Time Calculation (min)  40 min    Activity Tolerance  Patient tolerated treatment well    Behavior During Therapy  [facility] for tasks assessed/performed       Past Medical History:  Diagnosis Date  . Allergy   . Arthritis   . Breast cancer [facility]) 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  . Cancer ([place])    right breast   . Hyperlipidemia    takes Niacin  . Hypertension   . Personal history of radiation therapy 2011   rt breast    Past Surgical History:  Procedure Laterality Date  . BREAST DUCTAL SYSTEM EXCISION     right breast  . BREAST LUMPECTOMY    . BREAST SURGERY  august 2011   right partial mastectomy  . CATARACT EXTRACTION     bilateral  . CESAREAN SECTION    . ELBOW SURGERY     left  . JOINT REPLACEMENT    . KNEE ARTHROPLASTY    . MYOMECTOMY    . TOTAL KNEE ARTHROPLASTY Right 06/26/2018   Procedure: TOTAL KNEE ARTHROPLASTY;  Surgeon: Paralee Cancel, MD;  [facility];  Service: Orthopedics;  Laterality: Right;  70 mins    There were no vitals filed for this visit.  Subjective Assessment - 09/21/18 1637    Subjective  Pt ambulates onto pool deck not flexing her RT knee very well, looking very stiff despite pt reporting she did not feel stiff. LT knee pain.    Pertinent History  Right TKR 06/26/18 hx of Breast Cancer;  sees MD in Sept    Currently in Pain?  Yes    Pain Score  2     Pain Location  Knee    Pain Orientation  Left    Pain Descriptors / Indicators  Aching    Aggravating Factors   Overdoing it    Pain Relieving Factors  ice, rest    Multiple Pain Sites  No       Aquatic session: Water temp 86.7 degrees F. Pt entered and exited pool with heavy use of the Bil hand rail along the long ramp.  Seated exercises with 75% submersion: Ankle, knee, hip AROM exercises with concurrent education on the water principles she will utilize with her program. Verbal understanding.   Mid chest water walking: 2 lengths of the pool in each direction. Pt required large noodle for increased balance and increased knee flexion. No pain.   Standing waist deep water at edge of pool: Bil hip abd/add 10x with VC to push/pull to her tolerance. Single leg stance with hip circumduction 10x each direction. Bil  Seated hamstring stretching with 75% submersion.   Bicycle with large pool noodle behind patient 5 minutes.                           PT Short Term Goals - 09/03/18  [place] #1   Title  be independent in initial HEP    Status  Achieved      PT SHORT TERM GOAL #2   Title  Right knee extension improved to -5 degrees needed for improved standing and walking tolerance    Status  Achieved      PT SHORT TERM GOAL #3   Title  Right knee flexion improved to 100 degrees for greater ease getting in/out of the car    Status  Achieved      PT SHORT TERM GOAL #4   Title  The patient will be able to walk household distances with a cane    Status  Achieved        PT Long Term Goals - 09/18/18 1542      PT LONG TERM GOAL #1   Title  be independent in advanced HEP and an appropriate aquatic ex program    Time  5    Period  Weeks    Status  On-going      PT LONG TERM GOAL #2   Title  reduce FOTO to < or = to 27% limitation    Status  Partially Met      PT LONG TERM GOAL #3   Title  Right knee ROM 1-128 degrees needed with sit to stand and negotiating curbs and stairs    Status  Achieved      PT LONG TERM GOAL #4    Title  The patient will have improved right LE strength to grossly 4/5 to 4+/5  needed for walking community distances    Status  Achieved      PT LONG TERM GOAL #5   Title  The patient will have improved gait speed indicated with Timed up and Go < 20 sec    Status  Achieved      PT LONG TERM GOAL #6   Title  Decreased mid patella circumferential measurement right knee to 47 1/2 cm    Status  Achieved      PT LONG TERM GOAL #7   Title  10 meter walk test improved to 15 sec indicating more normal gait speed    Status  Partially Met      PT LONG TERM GOAL #8   Title  The patient will be able to ambulate 900 feet in 6 minutes indicating improved gait endurance    Baseline  500 feet hip pain    Status  Partially Met            Plan - 09/21/18 1641    Clinical Impression Statement  Pt was educated in the principles of water exercises that support joints and aide in pain reduction, hydrostatic pressure that aides edema, and how to use/create resisatnce/current to strengthen her LE. Pt did require a large noodle for water walking to aide in balance and promoting better knee flexion. There was no pain reported with any exercises.    Personal Factors and Comorbidities  Age    Examination-Activity Limitations  Bathing;Bed Mobility;Stairs;Locomotion Level;Lift;Stand    Examination-Participation Restrictions  Community Activity;Driving;Shop;Volunteer    Stability/Clinical Decision Making  Stable/Uncomplicated    Rehab Potential  Good    PT Frequency  2x / week    PT Duration  6 weeks    PT Treatment/Interventions  ADLs/Self Care Home Management;Cryotherapy;Neuromuscular re-education;Therapeutic exercise;Therapeutic activities;Patient/family education;Manual techniques;Taping;Vasopneumatic Device;Electrical Stimulation;Moist Heat;Aquatic Therapy    PT Next Visit Plan  2 more aquatic visits then DC    PT Home Exercise Plan  Access Code: [email or web address]    Consulted and Agree with Plan of Care   Patient       Patient will benefit from skilled therapeutic intervention in order to improve the following deficits and impairments:  Abnormal gait, Decreased range of motion, Difficulty walking, Decreased activity tolerance, Pain, Decreased strength, Decreased mobility, Increased edema  Visit Diagnosis: Stiffness of right knee, not elsewhere classified  Muscle weakness (generalized)  Acute pain of right knee  Other abnormalities of gait and mobility  Localized edema     Problem List Patient Active Problem List   Diagnosis Date Noted  . Hypokalemia 06/27/2018  . Overweight (BMI 25.0-29.9) 06/27/2018  . S/P right TKA 06/26/2018  . Autoimmune disease ([place]) 03/15/2016  . High risk medication use 03/15/2016  . Pain in joint of right shoulder 03/15/2016  . Bilateral hip pain 03/15/2016  . Primary osteoarthritis of both knees 03/15/2016  . 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 Arthritis 02/24/2014  . HTN (hypertension) 02/24/2014  . Hyperlipidemia 02/24/2014  . Breast cancer, right, upper outer quadrant  07/05/2010    Abubakr Wieman, PTA 09/21/2018, 4:45 PM  [facility] [address], [place], [place], [address] Phone: [phone]   Fax:  [phone]  Name: Yolanda Peters MRN: [id number] Date of Birth: 1940-09-06

## 2018-09-28 ENCOUNTER — Encounter: Payer: Self-pay | Admitting: Physical Therapy

## 2018-09-28 ENCOUNTER — Ambulatory Visit: Payer: Medicare Other | Admitting: Physical Therapy

## 2018-09-28 ENCOUNTER — Other Ambulatory Visit: Payer: Self-pay

## 2018-09-28 DIAGNOSIS — M25561 Pain in right knee: Secondary | ICD-10-CM

## 2018-09-28 DIAGNOSIS — M6281 Muscle weakness (generalized): Secondary | ICD-10-CM

## 2018-09-28 DIAGNOSIS — M25661 Stiffness of right knee, not elsewhere classified: Secondary | ICD-10-CM | POA: Diagnosis not present

## 2018-09-28 DIAGNOSIS — R2689 Other abnormalities of gait and mobility: Secondary | ICD-10-CM

## 2018-09-28 DIAGNOSIS — R600 Localized edema: Secondary | ICD-10-CM

## 2018-09-28 NOTE — Therapy (Signed)
[facility] [address], [place], [place], [address] Phone: [phone]   Fax:  [phone]  Physical Therapy Treatment  Patient Details  Name: Yolanda Peters MRN: [id number] Date of Birth: May 07, 1940 Referring Provider (PT): Dr. Alvan Dame   Encounter Date: 09/28/2018  PT End of Session - 09/28/18 2051    Visit Number  36    Date for PT Re-Evaluation  10/05/18    Authorization Type  UHC Medicare    PT Start Time  1430    PT Stop Time  1510    PT Time Calculation (min)  40 min    Activity Tolerance  Patient tolerated treatment well    Behavior During Therapy  [facility] for tasks assessed/performed       Past Medical History:  Diagnosis Date  . Allergy   . Arthritis   . Breast cancer [facility]) 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  . Cancer ([place])    right breast   . Hyperlipidemia    takes Niacin  . Hypertension   . Personal history of radiation therapy 2011   rt breast    Past Surgical History:  Procedure Laterality Date  . BREAST DUCTAL SYSTEM EXCISION     right breast  . BREAST LUMPECTOMY    . BREAST SURGERY  august 2011   right partial mastectomy  . CATARACT EXTRACTION     bilateral  . CESAREAN SECTION    . ELBOW SURGERY     left  . JOINT REPLACEMENT    . KNEE ARTHROPLASTY    . MYOMECTOMY    . TOTAL KNEE ARTHROPLASTY Right 06/26/2018   Procedure: TOTAL KNEE ARTHROPLASTY;  Surgeon: Paralee Cancel, MD;  Location: [facility];  Service: Orthopedics;  Laterality: Right;  70 mins    There were no vitals filed for this visit.  Subjective Assessment - 09/28/18 2054    Subjective  My lower legs were very sore yesterday. They are better today.    Pertinent History  Right TKR 06/26/18 hx of Breast Cancer;  sees MD in Sept    Currently in Pain?  No/denies      Treatment: Aquatic Session: Water temperature 86.7 degrees F. Pt entered and exited the pool via long ramp with bil hand rails. Pt very slow.   Seated: Ankle, knee, hip AROM  exercises in 100% submersion to warm up joints and discuss current status and pain.   Mid trunk Water walking with pool noodle for rowing and chest press 2x ineach direction.  Standing hip abduction bil 20x Standing circumduction 10x bil Standing knee flexion 20x bil Supported bicycle 6 min  Pt requested UE exercises for her arms: Used water hands weights for breast stroke motion 20x, then reverse 20x.  Push the weights down for tricep10x                          PT Short Term Goals - 09/03/18 1204      PT SHORT TERM GOAL #1   Title  be independent in initial HEP    Status  Achieved      PT SHORT TERM GOAL #2   Title  Right knee extension improved to -5 degrees needed for improved standing and walking tolerance    Status  Achieved      PT SHORT TERM GOAL #3   Title  Right knee flexion improved to 100 degrees for greater ease getting in/out of the car  Status  Achieved      PT SHORT TERM GOAL #4   Title  The patient will be able to walk household distances with a cane    Status  Achieved        PT Long Term Goals - 09/18/18 1542      PT LONG TERM GOAL #1   Title  be independent in advanced HEP and an appropriate aquatic ex program    Time  5    Period  Weeks    Status  On-going      PT LONG TERM GOAL #2   Title  reduce FOTO to < or = to 27% limitation    Status  Partially Met      PT LONG TERM GOAL #3   Title  Right knee ROM 1-128 degrees needed with sit to stand and negotiating curbs and stairs    Status  Achieved      PT LONG TERM GOAL #4   Title  The patient will have improved right LE strength to grossly 4/5 to 4+/5  needed for walking community distances    Status  Achieved      PT LONG TERM GOAL #5   Title  The patient will have improved gait speed indicated with Timed up and Go < 20 sec    Status  Achieved      PT LONG TERM GOAL #6   Title  Decreased mid patella circumferential measurement right knee to 47 1/2 cm    Status   Achieved      PT LONG TERM GOAL #7   Title  10 meter walk test improved to 15 sec indicating more normal gait speed    Status  Partially Met      PT LONG TERM GOAL #8   Title  The patient will be able to ambulate 900 feet in 6 minutes indicating improved gait endurance    Baseline  500 feet hip pain    Status  Partially Met            Plan - 09/28/18 2055    Clinical Impression Statement  Pt has learned she can return to her pool to continue aquatic exercises. She plans to try for 2 aquatic days and 1 land day in the gym. She demonstrates water walking today with and without the noodle for normalized gait. Pt was even able to increase her gait speed for 1 length. Pt was also able to incorporate UE resistive exercises using the aqua weights for her postural strength and endurance while performing her water walking. Pt also able to increase her reps for her LE strength today.    Personal Factors and Comorbidities  Age    Examination-Activity Limitations  Bathing;Bed Mobility;Stairs;Locomotion Level;Lift;Stand    Examination-Participation Restrictions  Community Activity;Driving;Shop;Volunteer    Stability/Clinical Decision Making  Stable/Uncomplicated    Rehab Potential  Good    PT Frequency  2x / week    PT Duration  6 weeks    PT Treatment/Interventions  ADLs/Self Care Home Management;Cryotherapy;Neuromuscular re-education;Therapeutic exercise;Therapeutic activities;Patient/family education;Manual techniques;Taping;Vasopneumatic Device;Electrical Stimulation;Moist Heat;Aquatic Therapy    PT Next Visit Plan  DCnext session    PT Home Exercise Plan  Access Code: [email or web address]    Consulted and Agree with Plan of Care  Patient       Patient will benefit from skilled therapeutic intervention in order to improve the following deficits and impairments:  Abnormal gait, Decreased range of motion, Difficulty walking, Decreased activity tolerance, Pain,  Decreased strength, Decreased mobility,  Increased edema  Visit Diagnosis: Stiffness of right knee, not elsewhere classified  Muscle weakness (generalized)  Acute pain of right knee  Other abnormalities of gait and mobility  Localized edema     Problem List Patient Active Problem List   Diagnosis Date Noted  . Hypokalemia 06/27/2018  . Overweight (BMI 25.0-29.9) 06/27/2018  . S/P right TKA 06/26/2018  . Autoimmune disease ([place]) 03/15/2016  . High risk medication use 03/15/2016  . Pain in joint of right shoulder 03/15/2016  . Bilateral hip pain 03/15/2016  . Primary osteoarthritis of both knees 03/15/2016  . 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 Arthritis 02/24/2014  . HTN (hypertension) 02/24/2014  . Hyperlipidemia 02/24/2014  . Breast cancer, right, upper outer quadrant  07/05/2010    Luciano Cinquemani, PTA 09/28/2018, 9:01 PM  [facility] [address], [place], [place], [address] Phone: [phone]   Fax:  [phone]  Name: GLENDY BARSANTI MRN: [id number] Date of Birth: 03/28/1940

## 2018-10-05 ENCOUNTER — Ambulatory Visit: Payer: Medicare Other | Admitting: Physical Therapy

## 2018-10-05 ENCOUNTER — Encounter: Payer: Self-pay | Admitting: Physical Therapy

## 2018-10-05 ENCOUNTER — Other Ambulatory Visit: Payer: Self-pay

## 2018-10-05 DIAGNOSIS — M25661 Stiffness of right knee, not elsewhere classified: Secondary | ICD-10-CM | POA: Diagnosis not present

## 2018-10-05 DIAGNOSIS — R2689 Other abnormalities of gait and mobility: Secondary | ICD-10-CM

## 2018-10-05 DIAGNOSIS — M6281 Muscle weakness (generalized): Secondary | ICD-10-CM

## 2018-10-05 DIAGNOSIS — R600 Localized edema: Secondary | ICD-10-CM

## 2018-10-05 DIAGNOSIS — M25561 Pain in right knee: Secondary | ICD-10-CM

## 2018-10-05 NOTE — Therapy (Addendum)
[facility] [address] [place], [place], [address] Phone: [phone]   Fax:  [phone]  Physical Therapy Treatment/Discharge   Patient Details  Name: Yolanda Peters MRN: [id number] Date of Birth: 02-16-1940 Referring Provider (PT): Dr. Katherine Roan PA   Encounter Date: 10/05/2018  PT End of Session - 10/05/18 1554    Visit Number  67    Date for PT Re-Evaluation  10/05/18    Authorization Type  UHC Medicare    PT Start Time  1300    PT Stop Time  1345    PT Time Calculation (min)  45 min    Activity Tolerance  Patient tolerated treatment well    Behavior During Therapy  [facility] for tasks assessed/performed       Past Medical History:  Diagnosis Date  . Allergy   . Arthritis   . Breast cancer [facility]) 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  . Cancer ([place])    right breast   . Hyperlipidemia    takes Niacin  . Hypertension   . Personal history of radiation therapy 2011   rt breast    Past Surgical History:  Procedure Laterality Date  . BREAST DUCTAL SYSTEM EXCISION     right breast  . BREAST LUMPECTOMY    . BREAST SURGERY  august 2011   right partial mastectomy  . CATARACT EXTRACTION     bilateral  . CESAREAN SECTION    . ELBOW SURGERY     left  . JOINT REPLACEMENT    . KNEE ARTHROPLASTY    . MYOMECTOMY    . TOTAL KNEE ARTHROPLASTY Right 06/26/2018   Procedure: TOTAL KNEE ARTHROPLASTY;  Surgeon: Paralee Cancel, MD;  [facility];  Service: Orthopedics;  Laterality: Right;  70 mins    There were no vitals filed for this visit.  Subjective Assessment - 10/05/18 1553    Subjective  My lower legs hurt when I leave the gym butthey do not hurt after I exercise in the water. Why is that?    Pertinent History  Right TKR 06/26/18 hx of Breast Cancer;  sees MD in Sept    Currently in Pain?  No/denies    Multiple Pain Sites  No      Treatment: Aquatic Session Water temperature 86.7 degrees F Pt entered and  exited the pool via long ramp with bil hand rails. Pt is slow with her gait speed.   Seated: Ankle, knee, hip A/ROM 20x each with concurrent discussion of status, her questions about her lower leg pain/aches, and what she would like to accomplish today.  Water walking in mid trunk height water with the noodle for postural strength to push and pull against the water. 4 lengths in each directions. No noodle for side stepping.  High knee stepping for balance with noodle for assistance and core activation. 2 lengths of the pool.  Single leg stance RT with noodle for posture ( trunk extension support) 10 sec attempted 5x, pt wobbly.  Noodle compressions for core activation 5 sec hold 10x  Knee bends 10x ( modified squats) holding onto noodle for balance and trunksupport  Bicycle x 5 min with noodle behind pt.    [facility] PT Assessment - 10/05/18 0001      Assessment   Medical Diagnosis  right TKA for osteoarthritis     Referring Provider (PT)  Dr. Katherine Roan PA      AROM   Right Knee  Extension  0    Right Knee Flexion  130      Strength   Overall Strength Comments  Able to rise from standard chair without UE assist.    Right Knee Flexion  4+/5    Right Knee Extension  4+/5      6 minute walk test results    Aerobic Endurance Distance Walked  500    Endurance additional comments  no cane;  slower speed secondary to increasing hip pain left and nonsurgical knee      Standardized Balance Assessment   10 Meter Walk  16      Timed Up and Go Test   Normal TUG (seconds)  12   no assistive device                            PT Short Term Goals - 09/03/18 1204      PT SHORT TERM GOAL #1   Title  be independent in initial HEP    Status  Achieved      PT SHORT TERM GOAL #2   Title  Right knee extension improved to -5 degrees needed for improved standing and walking tolerance    Status  Achieved      PT SHORT TERM GOAL #3   Title  Right knee flexion  improved to 100 degrees for greater ease getting in/out of the car    Status  Achieved      PT SHORT TERM GOAL #4   Title  The patient will be able to walk household distances with a cane    Status  Achieved        PT Long Term Goals - 10/05/18 1607      PT LONG TERM GOAL #1   Title  be independent in advanced HEP and an appropriate aquatic ex program    Time  5    Period  Weeks    Status  Achieved      PT LONG TERM GOAL #2   Title  reduce FOTO to < or = to 27% limitation    Period  Weeks    Status  Partially Met      PT LONG TERM GOAL #3   Title  Right knee ROM 1-128 degrees needed with sit to stand and negotiating curbs and stairs    Time  5    Period  Weeks    Status  Achieved      PT LONG TERM GOAL #4   Title  The patient will have improved right LE strength to grossly 4/5 to 4+/5  needed for walking community distances    Time  5    Period  Weeks    Status  Achieved      PT LONG TERM GOAL #5   Title  The patient will have improved gait speed indicated with Timed up and Go < 20 sec    Time  8    Period  Weeks    Status  Achieved      PT LONG TERM GOAL #6   Title  Decreased mid patella circumferential measurement right knee to 47 1/2 cm    Time  8    Period  Weeks    Status  Achieved      PT LONG TERM GOAL #7   Title  10 meter walk test improved to 15 sec indicating more normal gait speed    Time  5    Period  Weeks    Status  Partially Met      PT LONG TERM GOAL #8   Title  The patient will be able to ambulate 900 feet in 6 minutes indicating improved gait endurance    Baseline  500 feet hip pain    Time  5    Period  Weeks    Status  Partially Met            Plan - 10/05/18 1554    Clinical Impression Statement  Pt arrives to aquatic therapy today without pain and demonstrating a slow gait. She had questions regarding why her lower legs (bil) hurt after she exercises in the gym but they do not hurt after her water exercises. We discussed  possibilities of why this may occur in addition to speaking with her doctor regarding her vascular health. She is independent in her aquatic exercises and is currently signed up for 2 sessions at the [facility] next week. She had no pain complaints during her session today. Gait speed was improved as pt exited the pool deck.    Personal Factors and Comorbidities  Age    Examination-Activity Limitations  Bathing;Bed Mobility;Stairs;Locomotion Level;Lift;Stand    Examination-Participation Restrictions  Community Activity;Driving;Shop;Volunteer    Stability/Clinical Decision Making  Stable/Uncomplicated    Rehab Potential  Good    PT Frequency  2x / week    PT Duration  6 weeks    PT Treatment/Interventions  ADLs/Self Care Home Management;Cryotherapy;Neuromuscular re-education;Therapeutic exercise;Therapeutic activities;Patient/family education;Manual techniques;Taping;Vasopneumatic Device;Electrical Stimulation;Moist Heat;Aquatic Therapy    PT Next Visit Plan  DC PT, pt will continue with [employer] aquatics and land based exercises at [facility].    PT Home Exercise Plan  Access Code: [email or web address]    Consulted and Agree with Plan of Care  Patient       Patient will benefit from skilled therapeutic intervention in order to improve the following deficits and impairments:  Abnormal gait, Decreased range of motion, Difficulty walking, Decreased activity tolerance, Pain, Decreased strength, Decreased mobility, Increased edema  Visit Diagnosis: Muscle weakness (generalized)  Acute pain of right knee  Stiffness of right knee, not elsewhere classified  Other abnormalities of gait and mobility  Localized edema    PHYSICAL THERAPY DISCHARGE SUMMARY  Visits from Start of Care: 37  Current functional level related to goals / functional outcomes: See clinical impressions above  Remaining deficits: As above    Education / Equipment: HEP  Plan: Patient agrees to discharge.  Patient goals were partially met.  Patient is being discharged due to meeting the stated rehab goals.  ?????         Problem List Patient Active Problem List   Diagnosis Date Noted  . Hypokalemia 06/27/2018  . Overweight (BMI 25.0-29.9) 06/27/2018  . S/P right TKA 06/26/2018  . Autoimmune disease ([place]) 03/15/2016  . High risk medication use 03/15/2016  . Pain in joint of right shoulder 03/15/2016  . Bilateral hip pain 03/15/2016  . Primary osteoarthritis of both knees 03/15/2016  . 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 Arthritis 02/24/2014  . HTN (hypertension) 02/24/2014  . Hyperlipidemia 02/24/2014  . Breast cancer, right, upper outer quadrant  07/05/2010     Ruben Im, PT 01/01/19 7:51 AM Phone: [phone] Fax: [phone] Myrene Galas, PTA 10/05/18 4:10 PM  [facility] [address], [place], [place], [address] Phone: [phone]   Fax:  [phone]  Name: Yolanda Peters  MRN: [id number] Date of Birth: 11/04/1940

## 2018-10-12 ENCOUNTER — Encounter: Payer: Medicare Other | Admitting: Physical Therapy

## 2018-10-15 ENCOUNTER — Telehealth: Payer: Self-pay | Admitting: Rheumatology

## 2018-10-15 NOTE — Telephone Encounter (Signed)
Patient left voicemail to see if Dr. Estanislado Pandy knows any orthopedic surgeons that perform Robotic Knee Replacement surgery in [place] or [place].  Patient requested a return call.

## 2018-10-15 NOTE — Progress Notes (Deleted)
Office Visit Note  Patient: Yolanda Peters             Date of Birth: September 11, 1940           MRN: [id number]             PCP: Shon Baton, MD Referring: Shon Baton, MD Visit Date: 10/16/2018 Occupation: @GUAROCC @  Subjective:  No chief complaint on file.   History of Present Illness: Yolanda Peters is a 78 y.o. female ***   Activities of Daily Living:  Patient reports morning stiffness for *** {minute/hour:19697}.   Patient {ACTIONS;DENIES/REPORTS:21021675::"Denies"} nocturnal pain.  Difficulty dressing/grooming: {ACTIONS;DENIES/REPORTS:21021675::"Denies"} Difficulty climbing stairs: {ACTIONS;DENIES/REPORTS:21021675::"Denies"} Difficulty getting out of chair: {ACTIONS;DENIES/REPORTS:21021675::"Denies"} Difficulty using hands for taps, buttons, cutlery, and/or writing: {ACTIONS;DENIES/REPORTS:21021675::"Denies"}  No Rheumatology ROS completed.   PMFS History:  Patient Active Problem List   Diagnosis Date Noted  . Hypokalemia 06/27/2018  . Overweight (BMI 25.0-29.9) 06/27/2018  . S/P right TKA 06/26/2018  . Autoimmune disease ([place]) 03/15/2016  . High risk medication use 03/15/2016  . Pain in joint of right shoulder 03/15/2016  . Bilateral hip pain 03/15/2016  . Primary osteoarthritis of both knees 03/15/2016  . 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 Arthritis 02/24/2014  . HTN (hypertension) 02/24/2014  . Hyperlipidemia 02/24/2014  . Breast cancer, right, upper outer quadrant  07/05/2010    Past Medical History:  Diagnosis Date  . Allergy   . Arthritis   . Breast cancer [facility]) 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  . Cancer ([place])    right breast   . Hyperlipidemia    takes Niacin  . Hypertension   . Personal history of radiation therapy 2011   rt breast    Family History  Problem Relation Age of Onset  . Cancer Mother        bladder  . Cancer Father        leukemia  . Cancer Maternal Aunt        breast  . Colon cancer Paternal Aunt   . Colon cancer Cousin   .  Esophageal cancer Neg Hx   . Rectal cancer Neg Hx   . Stomach cancer Neg Hx    Past Surgical History:  Procedure Laterality Date  . BREAST DUCTAL SYSTEM EXCISION     right breast  . BREAST LUMPECTOMY    . BREAST SURGERY  august 2011   right partial mastectomy  . CATARACT EXTRACTION     bilateral  . CESAREAN SECTION    . ELBOW SURGERY     left  . JOINT REPLACEMENT    . KNEE ARTHROPLASTY    . MYOMECTOMY    . TOTAL KNEE ARTHROPLASTY Right 06/26/2018   Procedure: TOTAL KNEE ARTHROPLASTY;  Surgeon: Paralee Cancel, MD;  [facility];  Service: Orthopedics;  Laterality: Right;  70 mins   Social History   Social History Narrative  . Not on file   Immunization History  Administered Date(s) Administered  . Influenza Split 09/24/2013, 09/24/2014  . Tdap 08/28/2014  . Zoster Recombinat (Shingrix) 10/12/2017     Objective: Vital Signs: There were no vitals taken for this visit.   Physical Exam   Musculoskeletal Exam: ***  CDAI Exam: CDAI Score: -- Patient Global: --; Provider Global: -- Swollen: --; Tender: -- Joint Exam   No joint exam has been documented for this visit   There is currently no information documented on the homunculus. Go to the Rheumatology activity and complete the homunculus joint exam.  Investigation: No additional findings.  Imaging: No results found.  Recent Labs: Lab Results  Component Value Date   WBC 11.4 (H) 06/27/2018   HGB 9.9 (L) 06/27/2018   PLT 233 06/27/2018   NA 139 06/27/2018   K 3.0 (L) 06/27/2018   CL 101 06/27/2018   CO2 25 06/27/2018   GLUCOSE 154 (H) 06/27/2018   BUN 24 (H) 06/27/2018   CREATININE 0.82 06/27/2018   BILITOT 0.9 01/22/2018   ALKPHOS 87 08/17/2016   AST 17 01/22/2018   ALT 18 01/22/2018   PROT 6.0 (L) 01/22/2018   ALBUMIN 4.2 08/17/2016   CALCIUM 7.7 (L) 06/27/2018   GFRAA >60 06/27/2018    Speciality Comments: [facility]  Procedures:   No procedures performed Allergies: Penicillins   Assessment / Plan:     Visit Diagnoses: No diagnosis found.  Orders: No orders of the defined types were placed in this encounter.  No orders of the defined types were placed in this encounter.   Face-to-face time spent with patient was *** minutes. Greater than 50% of time was spent in counseling and coordination of care.  Follow-Up Instructions: No follow-ups on file.   Earnestine Mealing, CMA  Note - This record has been created using Editor, commissioning.  Chart creation errors have been sought, but may not always  have been located. Such creation errors do not reflect on  the standard of medical care.

## 2018-10-15 NOTE — Telephone Encounter (Signed)
We are not aware of any orthopedic surgeons performing robotic knee replacements.

## 2018-10-15 NOTE — Telephone Encounter (Signed)
Patient advised Dr. Estanislado Pandy are not aware of any orthopedic surgeons performing robotic knee replacements. Patient requested an appointment for a cortisone injection. Patient scheduled for 10/16/18.

## 2018-10-16 ENCOUNTER — Other Ambulatory Visit: Payer: Self-pay

## 2018-10-16 ENCOUNTER — Encounter: Payer: Self-pay | Admitting: Physician Assistant

## 2018-10-16 ENCOUNTER — Ambulatory Visit (INDEPENDENT_AMBULATORY_CARE_PROVIDER_SITE_OTHER): Payer: Medicare Other | Admitting: Rheumatology

## 2018-10-16 ENCOUNTER — Ambulatory Visit: Payer: Self-pay

## 2018-10-16 VITALS — BP 124/82 | HR 74 | Ht 65.0 in | Wt 152.0 lb

## 2018-10-16 DIAGNOSIS — M25562 Pain in left knee: Secondary | ICD-10-CM | POA: Diagnosis not present

## 2018-10-16 DIAGNOSIS — M359 Systemic involvement of connective tissue, unspecified: Secondary | ICD-10-CM | POA: Diagnosis not present

## 2018-10-16 DIAGNOSIS — G8929 Other chronic pain: Secondary | ICD-10-CM

## 2018-10-16 DIAGNOSIS — M19071 Primary osteoarthritis, right ankle and foot: Secondary | ICD-10-CM

## 2018-10-16 DIAGNOSIS — Z79899 Other long term (current) drug therapy: Secondary | ICD-10-CM | POA: Diagnosis not present

## 2018-10-16 DIAGNOSIS — Z853 Personal history of malignant neoplasm of breast: Secondary | ICD-10-CM

## 2018-10-16 DIAGNOSIS — Z96651 Presence of right artificial knee joint: Secondary | ICD-10-CM

## 2018-10-16 DIAGNOSIS — Z8679 Personal history of other diseases of the circulatory system: Secondary | ICD-10-CM

## 2018-10-16 DIAGNOSIS — M19072 Primary osteoarthritis, left ankle and foot: Secondary | ICD-10-CM

## 2018-10-16 DIAGNOSIS — Z8639 Personal history of other endocrine, nutritional and metabolic disease: Secondary | ICD-10-CM

## 2018-10-16 MED ORDER — LIDOCAINE HCL 1 % IJ SOLN
1.5000 mL | INTRAMUSCULAR | Status: AC | PRN
  Administered 2018-10-16: 1.5 mL

## 2018-10-16 MED ORDER — TRIAMCINOLONE ACETONIDE 40 MG/ML IJ SUSP
40.0000 mg | INTRAMUSCULAR | Status: AC | PRN
  Administered 2018-10-16: 40 mg via INTRA_ARTICULAR

## 2018-10-16 NOTE — Progress Notes (Signed)
Office Visit Note  Patient: Yolanda Peters             Date of Birth: 11/28/1940           MRN: [id number]             PCP: Shon Baton, MD Referring: Shon Baton, MD Visit Date: 10/16/2018 Occupation: @GUAROCC @  Subjective:  Left knee pain.   History of Present Illness: Yolanda Peters is a 78 y.o. female with known history of osteoarthritis and autoimmune disease.  She was recently seen for her regular visit.  She states she has been having increased pain and discomfort in her left knee joint over the last few months.  She denies any joint swelling.  She would like to have a cortisone injection to her left knee joint.  Her right noted total knee replacement has been doing well.  Activities of Daily Living:  Patient reports morning stiffness for 0 minute.   Patient Denies nocturnal pain.  Difficulty dressing/grooming: Denies Difficulty climbing stairs: Reports Difficulty getting out of chair: Reports Difficulty using hands for taps, buttons, cutlery, and/or writing: Denies  Review of Systems  Constitutional: Negative for fatigue, night sweats, weight gain and weight loss.  HENT: Negative for mouth sores, trouble swallowing, trouble swallowing, mouth dryness and nose dryness.   Eyes: Negative for pain, redness, visual disturbance and dryness.  Respiratory: Negative for cough, shortness of breath and difficulty breathing.   Cardiovascular: Negative for chest pain, palpitations, hypertension, irregular heartbeat and swelling in legs/feet.  Gastrointestinal: Negative for blood in stool, constipation and diarrhea.  Endocrine: Negative for increased urination.  Genitourinary: Negative for vaginal dryness.  Musculoskeletal: Positive for arthralgias and joint pain. Negative for joint swelling, myalgias, muscle weakness, morning stiffness, muscle tenderness and myalgias.  Skin: Negative for color change, rash, hair loss, skin tightness, ulcers and sensitivity to sunlight.    Allergic/Immunologic: Negative for susceptible to infections.  Neurological: Negative for dizziness, memory loss, night sweats and weakness.  Hematological: Negative for swollen glands.  Psychiatric/Behavioral: Negative for depressed mood and sleep disturbance. The patient is not nervous/anxious.     PMFS History:  Patient Active Problem List   Diagnosis Date Noted  . Hypokalemia 06/27/2018  . Overweight (BMI 25.0-29.9) 06/27/2018  . S/P right TKA 06/26/2018  . Autoimmune disease ([place]) 03/15/2016  . High risk medication use 03/15/2016  . Pain in joint of right shoulder 03/15/2016  . Bilateral hip pain 03/15/2016  . Primary osteoarthritis of both knees 03/15/2016  . 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 Arthritis 02/24/2014  . HTN (hypertension) 02/24/2014  . Hyperlipidemia 02/24/2014  . Breast cancer, right, upper outer quadrant  07/05/2010    Past Medical History:  Diagnosis Date  . Allergy   . Arthritis   . Breast cancer [facility]) 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  . Cancer ([place])    right breast   . Hyperlipidemia    takes Niacin  . Hypertension   . Personal history of radiation therapy 2011   rt breast    Family History  Problem Relation Age of Onset  . Cancer Mother        bladder  . Cancer Father        leukemia  . Cancer Maternal Aunt        breast  . Colon cancer Paternal Aunt   . Colon cancer Cousin   . Esophageal cancer Neg Hx   . Rectal cancer Neg Hx   . Stomach cancer  Neg Hx    Past Surgical History:  Procedure Laterality Date  . BREAST DUCTAL SYSTEM EXCISION     right breast  . BREAST LUMPECTOMY    . BREAST SURGERY  august 2011   right partial mastectomy  . CATARACT EXTRACTION     bilateral  . CESAREAN SECTION    . ELBOW SURGERY     left  . JOINT REPLACEMENT    . KNEE ARTHROPLASTY    . MYOMECTOMY    . TOTAL KNEE ARTHROPLASTY Right 06/26/2018   Procedure: TOTAL KNEE ARTHROPLASTY;  Surgeon: Paralee Cancel, MD;  Location: [facility];  Service: Orthopedics;   Laterality: Right;  70 mins   Social History   Social History Narrative  . Not on file   Immunization History  Administered Date(s) Administered  . Influenza Split 09/24/2013, 09/24/2014  . Tdap 08/28/2014  . Zoster Recombinat (Shingrix) 10/12/2017     Objective: Vital Signs: BP 124/82 (BP Location: Left Arm, Patient Position: Sitting, Cuff Size: Normal)   Pulse 74   Ht 5\' 5"  (1.651 m)   Wt 152 lb (68.9 kg)   BMI 25.29 kg/m    Physical Exam Vitals signs and nursing note reviewed.  Constitutional:      Appearance: She is well-developed.  HENT:     Head: Normocephalic and atraumatic.  Eyes:     Conjunctiva/sclera: Conjunctivae normal.  Neck:     Musculoskeletal: Normal range of motion.  Cardiovascular:     Rate and Rhythm: Normal rate and regular rhythm.     Heart sounds: Normal heart sounds.  Pulmonary:     Effort: Pulmonary effort is normal.     Breath sounds: Normal breath sounds.  Abdominal:     General: Bowel sounds are normal.     Palpations: Abdomen is soft.  Lymphadenopathy:     Cervical: No cervical adenopathy.  Skin:    General: Skin is warm and dry.     Capillary Refill: Capillary refill takes less than 2 seconds.  Neurological:     Mental Status: She is alert and oriented to person, place, and time.  Psychiatric:        Behavior: Behavior normal.      Musculoskeletal Exam: She has some DIP and PIP thickening in her hands and feet consistent with osteoarthritis.  Right knee joint is replaced.  Left knee joint had no warmth swelling or effusion.  She had discomfort with range of motion. CDAI Exam: CDAI Score: -- Patient Global: --; Provider Global: -- Swollen: --; Tender: -- Joint Exam   No joint exam has been documented for this visit   There is currently no information documented on the homunculus. Go to the Rheumatology activity and complete the homunculus joint exam.  Investigation: No additional findings.  Imaging: Xr Knee 3 View  Left  Result Date: 10/16/2018 Severe medial compartment narrowing was noted.  Medial, intercondylar and lateral osteophytes are noted.  No chondrocalcinosis was noted.  Severe patellofemoral narrowing was noted. Impression: These findings are consistent with severe osteoarthritis and severe chondromalacia patella.   Recent Labs: Lab Results  Component Value Date   WBC 11.4 (H) 06/27/2018   HGB 9.9 (L) 06/27/2018   PLT 233 06/27/2018   NA 139 06/27/2018   K 3.0 (L) 06/27/2018   CL 101 06/27/2018   CO2 25 06/27/2018   GLUCOSE 154 (H) 06/27/2018   BUN 24 (H) 06/27/2018   CREATININE 0.82 06/27/2018   BILITOT 0.9 01/22/2018   ALKPHOS 87 08/17/2016  AST 17 01/22/2018   ALT 18 01/22/2018   PROT 6.0 (L) 01/22/2018   ALBUMIN 4.2 08/17/2016   CALCIUM 7.7 (L) 06/27/2018   GFRAA >60 06/27/2018    Speciality Comments: [facility]  Procedures:  Large Joint Inj: L knee on 10/16/2018 3:41 PM Indications: pain Details: 27 G 1.5 in needle, medial approach  Arthrogram: No  Medications: 40 mg triamcinolone acetonide 40 MG/ML; 1.5 mL lidocaine 1 % Aspirate: 0 mL Outcome: tolerated well, no immediate complications Procedure, treatment alternatives, risks and benefits explained, specific risks discussed. Consent was given by the patient. Immediately prior to procedure a time out was called to verify the correct patient, procedure, equipment, support staff and site/side marked as required. Patient was prepped and draped in the usual sterile fashion.     Allergies: Penicillins   Assessment / Plan:     Visit Diagnoses: Chronic pain of left knee -she is been having progressively increased pain in her left knee joint.  Plan: XR KNEE 3 VIEW LEFT, Large Joint Inj: L knee.  The x-ray today showed severe osteoarthritis and severe chondromalacia patella.  I reviewed the x-rays with her.  She is thinking of total knee replacement in the future but  not now.  Per her request after informed consent was obtained left knee joint was prepped in sterile fashion injected with cortisone as described above.  Postprocedure precautions were discussed.  She may also benefit from Visco supplement injections in the future.  Autoimmune disease (HCC)-in remission  High risk medication use-she has been off Plaquenil  Status post total right knee replacement-she had surgery by Dr. Alvan Dame in the past.  She is doing well.  Primary osteoarthritis of both feet  History of breast cancer  History of hypertension  History of hyperlipidemia  Orders: Orders Placed This Encounter  Procedures  . Large Joint Inj: L knee  . XR KNEE 3 VIEW LEFT   No orders of the defined types were placed in this encounter.    Follow-Up Instructions: Return for Osteoarthritis.   Bo Merino, MD  Note - This record has been created using Editor, commissioning.  Chart creation errors have been sought, but may not always  have been located. Such creation errors do not reflect on  the standard of medical care.

## 2018-10-24 ENCOUNTER — Ambulatory Visit
Admission: RE | Admit: 2018-10-24 | Discharge: 2018-10-24 | Disposition: A | Discharge status: Home / Self Care | Payer: Medicare Other | Source: Ambulatory Visit | Attending: Internal Medicine | Admitting: Internal Medicine

## 2018-10-24 ENCOUNTER — Other Ambulatory Visit: Payer: Self-pay

## 2018-10-24 DIAGNOSIS — Z1231 Encounter for screening mammogram for malignant neoplasm of breast: Secondary | ICD-10-CM

## 2018-10-26 ENCOUNTER — Other Ambulatory Visit: Payer: Self-pay | Admitting: Internal Medicine

## 2018-10-26 DIAGNOSIS — R928 Other abnormal and inconclusive findings on diagnostic imaging of breast: Secondary | ICD-10-CM

## 2018-10-30 ENCOUNTER — Ambulatory Visit
Admission: RE | Admit: 2018-10-30 | Discharge: 2018-10-30 | Disposition: A | Discharge status: Home / Self Care | Payer: Medicare Other | Source: Ambulatory Visit | Attending: Internal Medicine | Admitting: Internal Medicine

## 2018-10-30 ENCOUNTER — Other Ambulatory Visit: Payer: Self-pay

## 2018-10-30 ENCOUNTER — Ambulatory Visit: Payer: Medicare Other

## 2018-10-30 DIAGNOSIS — R928 Other abnormal and inconclusive findings on diagnostic imaging of breast: Secondary | ICD-10-CM

## 2019-01-22 ENCOUNTER — Ambulatory Visit: Payer: Medicare Other | Attending: Internal Medicine

## 2019-01-22 DIAGNOSIS — Z23 Encounter for immunization: Secondary | ICD-10-CM | POA: Insufficient documentation

## 2019-01-22 NOTE — Progress Notes (Signed)
   Covid-19 Vaccination Clinic  Name:  Yolanda Peters    MRN: [id number] DOB: 1940-05-08  01/22/2019  Ms. Sattar was observed post Covid-19 immunization for 15 minutes without incidence. She was provided with Vaccine Information Sheet and instruction to access the V-Safe system.   Ms. Muchnick was instructed to call 911 with any severe reactions post vaccine: Marland Kitchen Difficulty breathing  . Swelling of your face and throat  . A fast heartbeat  . A bad rash all over your body  . Dizziness and weakness    Immunizations Administered    Name Date Dose VIS Date Route   Pfizer COVID-19 Vaccine 01/22/2019 12:56 PM 0.3 mL 12/21/2018 Intramuscular   Manufacturer: [place]   Lot: [id number]   [place]: [id number]

## 2019-01-30 ENCOUNTER — Ambulatory Visit: Payer: Medicare Other

## 2019-02-11 ENCOUNTER — Ambulatory Visit: Payer: Medicare Other | Attending: Internal Medicine

## 2019-02-11 DIAGNOSIS — Z23 Encounter for immunization: Secondary | ICD-10-CM

## 2019-02-11 NOTE — Progress Notes (Signed)
   Covid-19 Vaccination Clinic  Name:  Yolanda Peters    MRN: [id number] DOB: 15-Oct-1940  02/11/2019  [name] was observed post Covid-19 immunization for 15 minutes without incidence. She was provided with Vaccine Information Sheet and instruction to access the V-Safe system.   [name] was instructed to call 911 with any severe reactions post vaccine: Marland Kitchen Difficulty breathing  . Swelling of your face and throat  . A fast heartbeat  . A bad rash all over your body  . Dizziness and weakness    Immunizations Administered    Name Date Dose VIS Date Route   Pfizer COVID-19 Vaccine 02/11/2019  9:42 AM 0.3 mL 12/21/2018 Intramuscular   Manufacturer: [place]   Lot: [id number]   [place]: [id number]

## 2019-03-11 HISTORY — PX: REPLACEMENT TOTAL KNEE: SUR1224

## 2019-04-01 ENCOUNTER — Ambulatory Visit: Payer: Medicare Other | Attending: Orthopaedic Surgery | Admitting: Physical Therapy

## 2019-04-01 ENCOUNTER — Other Ambulatory Visit: Payer: Self-pay

## 2019-04-01 ENCOUNTER — Encounter: Payer: Self-pay | Admitting: Physical Therapy

## 2019-04-01 DIAGNOSIS — M25662 Stiffness of left knee, not elsewhere classified: Secondary | ICD-10-CM | POA: Diagnosis present

## 2019-04-01 DIAGNOSIS — R600 Localized edema: Secondary | ICD-10-CM | POA: Diagnosis present

## 2019-04-01 DIAGNOSIS — M25562 Pain in left knee: Secondary | ICD-10-CM | POA: Diagnosis present

## 2019-04-01 DIAGNOSIS — R2689 Other abnormalities of gait and mobility: Secondary | ICD-10-CM | POA: Insufficient documentation

## 2019-04-01 DIAGNOSIS — M6281 Muscle weakness (generalized): Secondary | ICD-10-CM | POA: Diagnosis present

## 2019-04-01 NOTE — Therapy (Signed)
[facility] [address] [place], [place], [address] Phone: [phone]   Fax:  [phone]  Physical Therapy Evaluation  Patient Details  Name: Yolanda Peters MRN: [id number] Date of Birth: 05/14/1940 Referring Provider (PT): Dr. Rema Jasmine   Encounter Date: 04/01/2019    Past Medical History:  Diagnosis Date  . Allergy   . Arthritis   . Breast cancer [facility]) 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  . Cancer ([place])    right breast   . Hyperlipidemia    takes Niacin  . Hypertension   . Personal history of radiation therapy 2011   rt breast    Past Surgical History:  Procedure Laterality Date  . BREAST DUCTAL SYSTEM EXCISION     right breast  . BREAST LUMPECTOMY    . BREAST SURGERY  august 2011   right partial mastectomy  . CATARACT EXTRACTION     bilateral  . CESAREAN SECTION    . ELBOW SURGERY     left  . JOINT REPLACEMENT    . KNEE ARTHROPLASTY    . MYOMECTOMY    . TOTAL KNEE ARTHROPLASTY Right 06/26/2018   Procedure: TOTAL KNEE ARTHROPLASTY;  Surgeon: Paralee Cancel, MD;  Location: [facility];  Service: Orthopedics;  Laterality: Right;  70 mins    There were no vitals filed for this visit.   Subjective Assessment - 04/01/19 1103    Subjective  Pt had Lt TKR via robotic surgery 4 days ago on 03/28/19.  Pt presents with rolling walker although reports doing some household ambulation without an AD.  Has started intial HEP from MD.  Having difficulty dressing Lt LE and with toilet and chair transfers.    Pertinent History  Rt TKR last year    Limitations  House hold activities;Standing;Walking;Other (comment)   transfers   How long can you stand comfortably?  5-10    How long can you walk comfortably?  5-10    Patient Stated Goals  get into bathtub stepping over 14" edge, go down 5 back steps with 2 rails to access backyard, be able to drive by early April    Currently in Pain?  Yes    Pain Score  2     Pain Location   Knee    Pain Orientation  Left;Anterior    Pain Descriptors / Indicators  Aching    Pain Type  Surgical pain    Pain Onset  More than a month ago    Pain Frequency  Intermittent    Aggravating Factors   bending the knee    Pain Relieving Factors  meds, although weaning them, ice/elevating - has Game Ready    Effect of Pain on Daily Activities  dressing, chair/toilet transfers, community ambulation distances         [facility] PT Assessment - 04/01/19 0001      Assessment   Referring Provider (PT)  Dr. Rema Jasmine    Onset Date/Surgical Date  03/28/19    Hand Dominance  Right    Next MD Visit  04/10/19    Prior Therapy  yes at this facility for other knee      Precautions   Precautions  None    Precaution Comments  as tolerated      Restrictions   Weight Bearing Restrictions  No      Balance Screen   Has the patient fallen in the past 6 months  No    Has the  patient had a decrease in activity level because of a fear of falling?   No    Is the patient reluctant to leave their home because of a fear of falling?   No      Home Environment   Living Environment  Private residence    Living Arrangements  Spouse/significant other    Type of [employer] to enter    Entrance Stairs-Number of Steps  2   5 steps with bil rail at back of [place]  One level    [place] - 2 wheels;Grab bars - tub/shower   single point cane, Game Ready     Prior Function   Level of Independence  Independent with basic ADLs;Independent with household mobility with device    Vocation  Retired    Leisure  member of [employer], community involvement, book club, Publishing rights manager, Psychologist, occupational at [employer]   Overall Cognitive Status  Within Functional Limits for tasks assessed      Observation/Other Assessments   Observations  longitudinal anterior thigh, knee, leg dressing present, no warmth or erethyma present, no compression  stockings (not used by MD per Pt report), pitting edema present leg x 3-4 sec rebound    Focus on Therapeutic Outcomes (FOTO)   62%, goal 33%      Observation/Other Assessments-Edema    Edema  Circumferential   mid-patellar     Circumferential Edema   Circumferential - Right  42cm    Circumferential - Left   47cm      Sensation   Light Touch  Appears Intact      Functional Tests   Functional tests  Sit to Stand;Step up;Step down      Sit to Stand   Comments  uses hands on thighs, nearly symmetrical WB bil LEs      Posture/Postural Control   Posture/Postural Control  No significant limitations      ROM / Strength   AROM / PROM / Strength  AROM;Strength      AROM   AROM Assessment Site  Knee    Right/Left Knee  Right;Left    Left Knee Extension  -3    Left Knee Flexion  110   protocol goal for 1-2 weeks 105 deg     Strength   Overall Strength Comments  Lt hip 4/5 throughout, Lt ankle 4/5 throughout    Strength Assessment Site  Knee    Right/Left Knee  Left    Left Knee Flexion  3+/5    Left Knee Extension  3+/5      Flexibility   Soft Tissue Assessment /Muscle Length  yes    Hamstrings  limited on Lt      Palpation   Patella mobility  limited all directions secondary to dressing and edema    Palpation comment  no signif tenderness present throughout Lt knee      Transfers   Transfers  Stand to Sit    Five time sit to stand comments   18 sec with UE assist    Stand to Sit  7: Independent;Without upper extremity assist      Ambulation/Gait   Ambulation/Gait  Yes    Ambulation/Gait Assistance  6: Modified independent (Device/Increase time)    Ambulation Distance (Feet)  320 Feet    Assistive device  Rolling walker    Gait  Pattern  Step-through pattern;Trendelenburg;Lateral trunk lean to left    Stairs  Yes    Stairs Assistance  5: Supervision    Stair Management Technique  Two rails;Step to pattern    Number of Stairs  4    Height of Stairs  6    Gait  Comments  heel toe pattern present on Lt, limited use of knee flexion, Lt trunk lean during Lt stance phase      6 Minute Walk- Baseline   6 Minute Walk- Baseline  yes      6 minute walk test results    Aerobic Endurance Distance Walked  240   with rolling walker   Endurance additional comments  3 min walk test                Objective measurements completed on examination: See above findings.              PT Education - 04/01/19 1251    Education Details  reinforced edema management - lay down several times a day with leg above heart, reviewed exercises from MD    Person(s) Educated  Patient    Methods  Explanation    Comprehension  Verbalized understanding       PT Short Term Goals - 04/01/19 1252      PT SHORT TERM GOAL #1   Title  Pt will be compliant and ind with initial HEP and edema management strategies    Time  2    Period  Weeks    Status  New    Target Date  04/15/19      PT SHORT TERM GOAL #2   Title  Pt will wean to single point cane for household and short community distances.    Time  4    Period  Weeks    Status  New    Target Date  04/22/19      PT SHORT TERM GOAL #3   Title  Pt will achieve Lt knee ROM 0-120 deg per surgeon's protocol.    Time  4    Period  Weeks    Status  New    Target Date  04/29/19      PT SHORT TERM GOAL #4   Title  Pt will be able to perform light household duties x 30 min without exacerbation of Lt knee pain.    Time  4    Period  Weeks    Status  New    Target Date  04/29/19      PT SHORT TERM GOAL #5   Title  Pt will be ind with dressing Lt LE, with bathing (able to step over 14" tub ledge), and report greater ease with toilet transfers.    Time  4    Period  Weeks    Status  New    Target Date  04/29/19        PT Long Term Goals - 04/01/19 1305      PT LONG TERM GOAL #1   Title  Pt will be ind in advanced HEP and understand how to safely progress.    Time  8    Period  Weeks    Status   New    Target Date  05/27/19      PT LONG TERM GOAL #2   Title  reduce FOTO to </= 33% to demo less limitation    Baseline  62%    Time  8  Period  Weeks    Status  New    Target Date  05/27/19      PT LONG TERM GOAL #3   Title  Pt will be able to perform community activities >/= 30 min without AD or with LRAD with symmetical gait pattern.    Time  8    Period  Weeks    Status  New    Target Date  05/27/19      PT LONG TERM GOAL #4   Title  Pt will achieve strength in Lt LE (hip, knee and ankle) of at least 4+/5 to improve functional tasks such as gait endurance, stairs, transfers.    Time  8    Period  Weeks    Status  New    Target Date  05/27/19      PT LONG TERM GOAL #5   Title  Pt will ambulate >/= 500' with 6 min walk test using LRAD or without AD.    Time  8    Period  Weeks    Status  New    Target Date  05/27/19             Plan - 04/01/19 1309    Clinical Impression Statement  Pt presents to PT 4 days s/p robotic Lt TKR on 03/28/19.  Lt knee ROM is -3-110 without pain.  Lt knee strength is 3+/5, Lt hip and ankle 4/5 throughout.  Pt is ambulating with 2-wheeled walker in community and at home.  Edema is present with 5cm circumferential difference mid-patellar Lt compared to Rt with pitting edema in Lt leg along tibia x 3-4 sec rebound.  Pt is not using compression hose per MD.  Lt knee anterior dressing is intact and is to be removed on Friday this week.  Pt was able to perform 5x sit to stand in 18 sec and covered 240' using walker for 3 min walk test.  She ambulated x 4 min total covering 320' without exacerbation of pain.  She has + Lt latreal trunk lean in Lt LE WB and uses limited knee flexion with gait.  She has initial HEP from surgeon and provided a protocol.  Pt will benefit from skilled PT to improve strength, ROM, edema, gait endurance, and functional activities following knee surgery.    Personal Factors and Comorbidities  Time since onset of  injury/illness/exacerbation    Examination-Activity Limitations  Transfers;Bend;Locomotion Level;Toileting;Stairs    Examination-Participation Restrictions  Driving;Community Activity;Cleaning;Meal Prep;Laundry    Stability/Clinical Decision Making  Stable/Uncomplicated    Clinical Decision Making  Low    Rehab Potential  Good    PT Frequency  3x / week   plan to taper to 2x/week after 3 weeks   PT Duration  8 weeks    PT Treatment/Interventions  ADLs/Self Care Home Management;Aquatic Therapy;Electrical Stimulation;Cryotherapy;DME Instruction;Gait training;Stair training;Functional mobility training;Therapeutic activities;Therapeutic exercise;Balance training;Neuromuscular re-education;Manual techniques;Patient/family education;Passive range of motion;Dry needling;Taping;Vasopneumatic Device;Joint Manipulations    PT Next Visit Plan  f/u on exercises from surgeon, review them, bike or NuStep, game ready    Consulted and Agree with Plan of Care  Patient       Patient will benefit from skilled therapeutic intervention in order to improve the following deficits and impairments:  Abnormal gait, Decreased range of motion, Difficulty walking, Decreased endurance, Pain, Decreased scar mobility, Impaired flexibility, Hypomobility, Decreased mobility, Decreased strength, Increased edema  Visit Diagnosis: Left knee pain, unspecified chronicity - Plan: PT plan of care cert/re-cert  Muscle  weakness (generalized) - Plan: PT plan of care cert/re-cert  Stiffness of left knee, not elsewhere classified - Plan: PT plan of care cert/re-cert  Localized edema - Plan: PT plan of care cert/re-cert     Problem List Patient Active Problem List   Diagnosis Date Noted  . Hypokalemia 06/27/2018  . Overweight (BMI 25.0-29.9) 06/27/2018  . S/P right TKA 06/26/2018  . Autoimmune disease ([place]) 03/15/2016  . High risk medication use 03/15/2016  . Pain in joint of right shoulder 03/15/2016  . Bilateral hip pain  03/15/2016  . Primary osteoarthritis of both knees 03/15/2016  . 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 Arthritis 02/24/2014  . HTN (hypertension) 02/24/2014  . Hyperlipidemia 02/24/2014  . Breast cancer, right, upper outer quadrant  07/05/2010    Baruch Merl, PT 04/01/19 1:18 PM   [facility] [address], [place], [place], [address] Phone: [phone]   Fax:  [phone]  Name: KRUPA DIANTONIO MRN: [id number] Date of Birth: 10-10-1940

## 2019-04-03 ENCOUNTER — Encounter: Payer: Self-pay | Admitting: Physical Therapy

## 2019-04-03 ENCOUNTER — Ambulatory Visit: Payer: Medicare Other | Admitting: Physical Therapy

## 2019-04-03 ENCOUNTER — Other Ambulatory Visit: Payer: Self-pay

## 2019-04-03 DIAGNOSIS — M25562 Pain in left knee: Secondary | ICD-10-CM

## 2019-04-03 DIAGNOSIS — R600 Localized edema: Secondary | ICD-10-CM

## 2019-04-03 DIAGNOSIS — M25662 Stiffness of left knee, not elsewhere classified: Secondary | ICD-10-CM

## 2019-04-03 DIAGNOSIS — M6281 Muscle weakness (generalized): Secondary | ICD-10-CM

## 2019-04-03 NOTE — Therapy (Signed)
[facility] [address], [place], [place], [address] Phone: [phone]   Fax:  [phone]  Physical Therapy Treatment  Patient Details  Name: Yolanda Peters MRN: [id number] Date of Birth: [birth year] Referring Provider (PT): Dr. Rema Jasmine   Encounter Date: 04/03/2019  PT End of Session - 04/03/19 1131    Visit Number  2    Date for PT Re-Evaluation  05/27/19    Authorization Type  UHC Medicare    Progress Note Due on Visit  10    PT Start Time  1100    PT Stop Time  1155    PT Time Calculation (min)  55 min    Activity Tolerance  Patient tolerated treatment well    Behavior During Therapy  [facility] for tasks assessed/performed       Past Medical History:  Diagnosis Date  . Allergy   . Arthritis   . Breast cancer [facility]) 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  . Cancer ([place])    right breast   . Hyperlipidemia    takes Niacin  . Hypertension   . Personal history of radiation therapy 2011   rt breast    Past Surgical History:  Procedure Laterality Date  . BREAST DUCTAL SYSTEM EXCISION     right breast  . BREAST LUMPECTOMY    . BREAST SURGERY  august 2011   right partial mastectomy  . CATARACT EXTRACTION     bilateral  . CESAREAN SECTION    . ELBOW SURGERY     left  . JOINT REPLACEMENT    . KNEE ARTHROPLASTY    . MYOMECTOMY    . TOTAL KNEE ARTHROPLASTY Right 06/26/2018   Procedure: TOTAL KNEE ARTHROPLASTY;  Surgeon: Paralee Cancel, MD;  Location: [facility];  Service: Orthopedics;  Laterality: Right;  70 mins    There were no vitals filed for this visit.  Subjective Assessment - 04/03/19 1103    Subjective  Pt arrives using SPC and achey knee    Pertinent History  Rt TKR last year    Limitations  House hold activities;Standing;Walking;Other (comment)    How long can you stand comfortably?  5-10    How long can you walk comfortably?  5-10    Patient Stated Goals  get into bathtub stepping over 14" edge, go down 5  back steps with 2 rails to access backyard, be able to drive by early April    Currently in Pain?  Yes    Pain Score  1     Pain Location  Knee    Pain Orientation  Left;Anterior    Pain Descriptors / Indicators  Aching    Pain Type  Surgical pain    Pain Onset  More than a month ago    Pain Frequency  Intermittent    Aggravating Factors   bending the knee    Pain Relieving Factors  meds, although weaning them, has Game Ready    Effect of Pain on Daily Activities  dressing, chair/toilet transfers, community amb distances                       [facility] Adult PT Treatment/Exercise - 04/03/19 0001      Exercises   Exercises  Knee/Hip      Knee/Hip Exercises: Aerobic   Recumbent Bike  L1 x 6' after other ther ex   rock and roll only for Lt knee flexion     Knee/Hip  Exercises: Standing   Heel Raises  Both;10 reps;2 sets    Knee Flexion  Left;2 sets;10 reps    Hip Flexion  Both;Knee bent    Hip Flexion Limitations  marching with high knees x 20 alt    Hip Abduction  Left;2 sets;10 reps;Knee straight    Hip Extension  Left;2 sets;10 reps;Knee straight      Knee/Hip Exercises: Seated   Long Arc Quad  Left;10 reps    Heel Slides  Left;15 reps    Heel Slides Limitations  with floor slider      Knee/Hip Exercises: Supine   Short Arc Quad Sets  Left;15 reps    Short Arc Quad Sets Limitations  with hold for 5 ankle pumps each rep    Heel Slides  Left;15 reps    Straight Leg Raises  Left;10 reps;AROM    Other Supine Knee/Hip Exercises  glut set 10x 3 sec hold      Vasopneumatic   Number Minutes Vasopneumatic   10 minutes    Vasopnuematic Location   Knee    Vasopneumatic Pressure  Low    Vasopneumatic Temperature   three snowflakes      Manual Therapy   Manual Therapy  Edema management    Edema Management  retrograde massage, working around anterior bandaging               PT Short Term Goals - 04/03/19 1132      PT SHORT TERM GOAL #1   Title  Pt will  be compliant and ind with initial HEP and edema management strategies    Status  On-going      PT SHORT TERM GOAL #2   Title  Pt will wean to single point cane for household and short community distances.    Status  Achieved      PT SHORT TERM GOAL #3   Title  Pt will achieve Lt knee ROM 0-120 deg per surgeon's protocol.    Status  On-going        PT Long Term Goals - 04/01/19 1305      PT LONG TERM GOAL #1   Title  Pt will be ind in advanced HEP and understand how to safely progress.    Time  8    Period  Weeks    Status  New    Target Date  05/27/19      PT LONG TERM GOAL #2   Title  reduce FOTO to </= 33% to demo less limitation    Baseline  62%    Time  8    Period  Weeks    Status  New    Target Date  05/27/19      PT LONG TERM GOAL #3   Title  Pt will be able to perform community activities >/= 30 min without AD or with LRAD with symmetical gait pattern.    Time  8    Period  Weeks    Status  New    Target Date  05/27/19      PT LONG TERM GOAL #4   Title  Pt will achieve strength in Lt LE (hip, knee and ankle) of at least 4+/5 to improve functional tasks such as gait endurance, stairs, transfers.    Time  8    Period  Weeks    Status  New    Target Date  05/27/19      PT LONG TERM GOAL #5   Title  Pt will ambulate >/= 500' with 6 min walk test using LRAD or without AD.    Time  8    Period  Weeks    Status  New    Target Date  05/27/19            Plan - 04/03/19 1141    Clinical Impression Statement  Pt with excellent pain control and ongoing edema from Lt knee surgery last week.  She has weaned to no AD at home and using [facility] for short distance community outings.  PT reviewed HEP from surgeon today and performed retrograde massage/Game Ready for edema management.  PT reiterated need for edema management at home and the number of times per day expected of her HEP.  She will continue to benefit from skilled PT as she rehabs her Lt post-op knee.     Rehab Potential  Good    PT Frequency  3x / week   taper to 2x/week after 3-4 weeks   PT Duration  8 weeks    PT Treatment/Interventions  ADLs/Self Care Home Management;Aquatic Therapy;Electrical Stimulation;Cryotherapy;DME Instruction;Gait training;Stair training;Functional mobility training;Therapeutic activities;Therapeutic exercise;Balance training;Neuromuscular re-education;Manual techniques;Patient/family education;Passive range of motion;Dry needling;Taping;Vasopneumatic Device;Joint Manipulations    PT Next Visit Plan  Lt knee edema management, bike or NuStep, ROM, strength, gait training    Consulted and Agree with Plan of Care  Patient       Patient will benefit from skilled therapeutic intervention in order to improve the following deficits and impairments:     Visit Diagnosis: Left knee pain, unspecified chronicity  Muscle weakness (generalized)  Stiffness of left knee, not elsewhere classified  Localized edema     Problem List Patient Active Problem List   Diagnosis Date Noted  . Hypokalemia 06/27/2018  . Overweight (BMI 25.0-29.9) 06/27/2018  . S/P right TKA 06/26/2018  . Autoimmune disease ([place]) 03/15/2016  . High risk medication use 03/15/2016  . Pain in joint of right shoulder 03/15/2016  . Bilateral hip pain 03/15/2016  . Primary osteoarthritis of both knees 03/15/2016  . 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 Arthritis 02/24/2014  . HTN (hypertension) 02/24/2014  . Hyperlipidemia 02/24/2014  . Breast cancer, right, upper outer quadrant  07/05/2010    Baruch Merl, PT 04/03/19 11:44 AM   [facility] [address] [place], [place], [address] Phone: [phone]   Fax:  [phone]  Name: Yolanda Peters MRN: [id number] Date of Birth: 09-15-1940

## 2019-04-05 ENCOUNTER — Ambulatory Visit: Payer: Medicare Other | Admitting: Physical Therapy

## 2019-04-05 ENCOUNTER — Other Ambulatory Visit: Payer: Self-pay

## 2019-04-05 ENCOUNTER — Encounter: Payer: Self-pay | Admitting: Physical Therapy

## 2019-04-05 DIAGNOSIS — M6281 Muscle weakness (generalized): Secondary | ICD-10-CM

## 2019-04-05 DIAGNOSIS — R600 Localized edema: Secondary | ICD-10-CM

## 2019-04-05 DIAGNOSIS — M25562 Pain in left knee: Secondary | ICD-10-CM

## 2019-04-05 DIAGNOSIS — M25662 Stiffness of left knee, not elsewhere classified: Secondary | ICD-10-CM

## 2019-04-05 NOTE — Therapy (Signed)
[facility] [address], [place], [place], [address] Phone: [phone]   Fax:  [phone]  Physical Therapy Treatment  Patient Details  Name: Yolanda Peters MRN: [id number] Date of Birth: [birth year] Referring Provider (PT): Dr. Rema Jasmine   Encounter Date: 04/05/2019  PT End of Session - 04/05/19 1023    Visit Number  3    Date for PT Re-Evaluation  05/27/19    Authorization Type  UHC Medicare    PT Start Time  1016    PT Stop Time  1110    PT Time Calculation (min)  54 min    Activity Tolerance  Patient tolerated treatment well    Behavior During Therapy  [facility] for tasks assessed/performed       Past Medical History:  Diagnosis Date  . Allergy   . Arthritis   . Breast cancer [facility]) 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  . Cancer ([place])    right breast   . Hyperlipidemia    takes Niacin  . Hypertension   . Personal history of radiation therapy 2011   rt breast    Past Surgical History:  Procedure Laterality Date  . BREAST DUCTAL SYSTEM EXCISION     right breast  . BREAST LUMPECTOMY    . BREAST SURGERY  august 2011   right partial mastectomy  . CATARACT EXTRACTION     bilateral  . CESAREAN SECTION    . ELBOW SURGERY     left  . JOINT REPLACEMENT    . KNEE ARTHROPLASTY    . MYOMECTOMY    . TOTAL KNEE ARTHROPLASTY Right 06/26/2018   Procedure: TOTAL KNEE ARTHROPLASTY;  Surgeon: Paralee Cancel, MD;  [facility];  Service: Orthopedics;  Laterality: Right;  70 mins    There were no vitals filed for this visit.  Subjective Assessment - 04/05/19 1021    Subjective  I am not using cane in the house.  I was able to get in/out of the tub and go up/down the stairs at the back of my house.  I'm a bit achey today - I'm cutting back on my pain meds and trying to get off them completely.    Pertinent History  Rt TKR last year    Limitations  House hold activities;Standing;Walking;Other (comment)    How long can you  stand comfortably?  5-10    How long can you walk comfortably?  5-10    Patient Stated Goals  get into bathtub stepping over 14" edge, go down 5 back steps with 2 rails to access backyard, be able to drive by early April    Currently in Pain?  Yes    Pain Score  2     Pain Location  Knee    Pain Orientation  Left;Anterior    Pain Descriptors / Indicators  Aching    Pain Type  Surgical pain    Pain Onset  More than a month ago    Pain Frequency  Intermittent         [facility] PT Assessment - 04/05/19 0001      Observation/Other Assessments   Observations  large bandage was removed by Pt, smaller longitudinal bandage present Lt knee                   [facility] Adult PT Treatment/Exercise - 04/05/19 0001      Exercises   Exercises  Knee/Hip      Knee/Hip Exercises: Aerobic  Nustep  L1 x 6', PT present to review STGs      Knee/Hip Exercises: Standing   Hip Flexion  Knee bent;Left    Hip Flexion Limitations  Lt marching with high knee x 20 holding treadmill    Hip Abduction  Left;10 reps;Knee straight    Hip Extension  Left;10 reps;Knee straight    Other Standing Knee Exercises  lateral weight shifts bil LEs with UE support 5x5 sec holds bil      Knee/Hip Exercises: Seated   Long Arc Quad  Left;10 reps    Heel Slides  AROM;Left;15 reps    Heel Slides Limitations  with floor slider    Other Seated Knee/Hip Exercises  heel/toe raises x 20      Knee/Hip Exercises: Supine   Other Supine Knee/Hip Exercises  knee extension pumps x 15 in 90/90 and ankle pumps x 50, Lt only      Vasopneumatic   Number Minutes Vasopneumatic   10 minutes    Vasopnuematic Location   Knee    Vasopneumatic Pressure  Low    Vasopneumatic Temperature   three snowflakes      Manual Therapy   Manual Therapy  Edema management    Edema Management  retrograde massage Lt leg and knee, working around anterior bandaging               PT Short Term Goals - 04/05/19 1023      PT SHORT TERM  GOAL #1   Title  Pt will be compliant and ind with initial HEP and edema management strategies    Baseline  2x/day compliance    Status  Achieved      PT SHORT TERM GOAL #2   Title  Pt will wean to single point cane for household and short community distances.    Status  Achieved      PT SHORT TERM GOAL #3   Title  Pt will achieve Lt knee ROM 0-120 deg per surgeon's protocol.    Status  On-going      PT SHORT TERM GOAL #4   Title  Pt will be able to perform light household duties x 30 min without exacerbation of Lt knee pain.    Baseline  doing washer/dryer    Status  On-going      PT SHORT TERM GOAL #5   Title  Pt will be ind with dressing Lt LE, with bathing (able to step over 14" tub ledge), and report greater ease with toilet transfers.    Baseline  met for tub and toilet transfers, ongoing for dressing for socks/shoes    Status  On-going        PT Long Term Goals - 04/01/19 1305      PT LONG TERM GOAL #1   Title  Pt will be ind in advanced HEP and understand how to safely progress.    Time  8    Period  Weeks    Status  New    Target Date  05/27/19      PT LONG TERM GOAL #2   Title  reduce FOTO to </= 33% to demo less limitation    Baseline  62%    Time  8    Period  Weeks    Status  New    Target Date  05/27/19      PT LONG TERM GOAL #3   Title  Pt will be able to perform community activities >/= 30 min without  AD or with LRAD with symmetical gait pattern.    Time  8    Period  Weeks    Status  New    Target Date  05/27/19      PT LONG TERM GOAL #4   Title  Pt will achieve strength in Lt LE (hip, knee and ankle) of at least 4+/5 to improve functional tasks such as gait endurance, stairs, transfers.    Time  8    Period  Weeks    Status  New    Target Date  05/27/19      PT LONG TERM GOAL #5   Title  Pt will ambulate >/= 500' with 6 min walk test using LRAD or without AD.    Time  8    Period  Weeks    Status  New    Target Date  05/27/19             Plan - 04/05/19 1036    Clinical Impression Statement  Pt making great functional progress.  She has met or partially met all STGs within first week of PT.  She reports she is going without AD at home.  PT assessed gait with and without SPC and PT is recommending use of SPC for improved gait quality, weight bearing and hip muscle recruitment in Lt weight bearing phase of gait. Lt knee ROM measures -4 extension and 112 flexion.  Edema persists in Lt LE in leg and knee with circumference measurement mid-patellar 45cm compared to 40cm on Rt knee.  Edema persists thoughout Lt LE with pitting x 8-10 sec.  Pt sees PA next week mid-week.  Outer bandaging was removed by Pt this week.  Pt will continue to benefit from skilled PT for post-op recovery of ROM, edema management, strength and return to functional activty.    PT Frequency  3x / week    PT Duration  8 weeks    PT Treatment/Interventions  ADLs/Self Care Home Management;Aquatic Therapy;Electrical Stimulation;Cryotherapy;DME Instruction;Gait training;Stair training;Functional mobility training;Therapeutic activities;Therapeutic exercise;Balance training;Neuromuscular re-education;Manual techniques;Patient/family education;Passive range of motion;Dry needling;Taping;Vasopneumatic Device;Joint Manipulations    PT Next Visit Plan  Lt knee edema management, bike or NuStep, ROM, strength, gait training, vaso    PT Home Exercise Plan  no Medbridge yet, going off protocol from MD    Recommended Other Services  Pt booked 3x/week, taper to 2x/week after 3-4 weeks    Consulted and Agree with Plan of Care  Patient       Patient will benefit from skilled therapeutic intervention in order to improve the following deficits and impairments:     Visit Diagnosis: Left knee pain, unspecified chronicity  Muscle weakness (generalized)  Stiffness of left knee, not elsewhere classified  Localized edema     Problem List Patient Active Problem List    Diagnosis Date Noted  . Hypokalemia 06/27/2018  . Overweight (BMI 25.0-29.9) 06/27/2018  . S/P right TKA 06/26/2018  . Autoimmune disease ([place]) 03/15/2016  . High risk medication use 03/15/2016  . Pain in joint of right shoulder 03/15/2016  . Bilateral hip pain 03/15/2016  . Primary osteoarthritis of both knees 03/15/2016  . 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 Arthritis 02/24/2014  . HTN (hypertension) 02/24/2014  . Hyperlipidemia 02/24/2014  . Breast cancer, right, upper outer quadrant  07/05/2010    Baruch Merl, PT 04/05/19 12:00 PM   [facility] [address], [place], [place], [address] Phone: [phone]  Fax:  [phone]  Name: KEIRRA ZEIMET MRN: [id number] Date of Birth: [birth year]

## 2019-04-08 ENCOUNTER — Ambulatory Visit: Payer: Medicare Other

## 2019-04-08 ENCOUNTER — Other Ambulatory Visit: Payer: Self-pay

## 2019-04-08 DIAGNOSIS — M25562 Pain in left knee: Secondary | ICD-10-CM

## 2019-04-08 DIAGNOSIS — R2689 Other abnormalities of gait and mobility: Secondary | ICD-10-CM

## 2019-04-08 DIAGNOSIS — R600 Localized edema: Secondary | ICD-10-CM

## 2019-04-08 DIAGNOSIS — M25662 Stiffness of left knee, not elsewhere classified: Secondary | ICD-10-CM

## 2019-04-08 DIAGNOSIS — M6281 Muscle weakness (generalized): Secondary | ICD-10-CM

## 2019-04-08 NOTE — Therapy (Signed)
[facility] [address] [place], [place], [address] Phone: [phone]   Fax:  [phone]  Physical Therapy Treatment  Patient Details  Name: Yolanda Peters MRN: [id number] Date of Birth: August 18, 1940 Referring Provider (PT): Dr. Rema Jasmine   Encounter Date: 04/08/2019  PT End of Session - 04/08/19 1313    Visit Number  4    Date for PT Re-Evaluation  05/27/19    Authorization Type  UHC Medicare    PT Start Time  1225    PT Stop Time  1320    PT Time Calculation (min)  55 min    Activity Tolerance  Patient tolerated treatment well    Behavior During Therapy  [facility] for tasks assessed/performed       Past Medical History:  Diagnosis Date  . Allergy   . Arthritis   . Breast cancer [facility]) 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  . Cancer ([place])    right breast   . Hyperlipidemia    takes Niacin  . Hypertension   . Personal history of radiation therapy 2011   rt breast    Past Surgical History:  Procedure Laterality Date  . BREAST DUCTAL SYSTEM EXCISION     right breast  . BREAST LUMPECTOMY    . BREAST SURGERY  august 2011   right partial mastectomy  . CATARACT EXTRACTION     bilateral  . CESAREAN SECTION    . ELBOW SURGERY     left  . JOINT REPLACEMENT    . KNEE ARTHROPLASTY    . MYOMECTOMY    . TOTAL KNEE ARTHROPLASTY Right 06/26/2018   Procedure: TOTAL KNEE ARTHROPLASTY;  Surgeon: Paralee Cancel, MD;  [facility];  Service: Orthopedics;  Laterality: Right;  70 mins    There were no vitals filed for this visit.  Subjective Assessment - 04/08/19 1239    Subjective  I am using my cane to help with my knee flexion when walking.    Currently in Pain?  No/denies                       [facility] Adult PT Treatment/Exercise - 04/08/19 0001      Knee/Hip Exercises: Aerobic   Nustep  L1 x 8', PT present      Knee/Hip Exercises: Standing   Rocker Board  3 minutes    Rebounder  weight shift 3 ways x  1 minute each      Knee/Hip Exercises: Seated   Long Arc Quad  Left;20 reps    Long Arc Quad Limitations  5 second hold with quad cueing    Heel Slides  AROM;Left;15 reps    Heel Slides Limitations  with floor slider    Hamstring Curl  Strengthening;Left;2 sets;10 reps    Hamstring Limitations  red band      Knee/Hip Exercises: Supine   Straight Leg Raises  Strengthening;Left;20 reps      Vasopneumatic   Number Minutes Vasopneumatic   15 minutes    Vasopnuematic Location   Knee    Vasopneumatic Pressure  Medium    Vasopneumatic Temperature   three snowflakes      Manual Therapy   Manual Therapy  Edema management    Edema Management  retrograde massage Lt leg and knee, working around anterior bandaging               PT Short Term Goals - 04/05/19 1023  PT SHORT TERM GOAL #1   Title  Pt will be compliant and ind with initial HEP and edema management strategies    Baseline  2x/day compliance    Status  Achieved      PT SHORT TERM GOAL #2   Title  Pt will wean to single point cane for household and short community distances.    Status  Achieved      PT SHORT TERM GOAL #3   Title  Pt will achieve Lt knee ROM 0-120 deg per surgeon's protocol.    Status  On-going      PT SHORT TERM GOAL #4   Title  Pt will be able to perform light household duties x 30 min without exacerbation of Lt knee pain.    Baseline  doing washer/dryer    Status  On-going      PT SHORT TERM GOAL #5   Title  Pt will be ind with dressing Lt LE, with bathing (able to step over 14" tub ledge), and report greater ease with toilet transfers.    Baseline  met for tub and toilet transfers, ongoing for dressing for socks/shoes    Status  On-going        PT Long Term Goals - 04/01/19 1305      PT LONG TERM GOAL #1   Title  Pt will be ind in advanced HEP and understand how to safely progress.    Time  8    Period  Weeks    Status  New    Target Date  05/27/19      PT LONG TERM GOAL #2    Title  reduce FOTO to </= 33% to demo less limitation    Baseline  62%    Time  8    Period  Weeks    Status  New    Target Date  05/27/19      PT LONG TERM GOAL #3   Title  Pt will be able to perform community activities >/= 30 min without AD or with LRAD with symmetical gait pattern.    Time  8    Period  Weeks    Status  New    Target Date  05/27/19      PT LONG TERM GOAL #4   Title  Pt will achieve strength in Lt LE (hip, knee and ankle) of at least 4+/5 to improve functional tasks such as gait endurance, stairs, transfers.    Time  8    Period  Weeks    Status  New    Target Date  05/27/19      PT LONG TERM GOAL #5   Title  Pt will ambulate >/= 500' with 6 min walk test using LRAD or without AD.    Time  8    Period  Weeks    Status  New    Target Date  05/27/19            Plan - 04/08/19 1316    Clinical Impression Statement  Pt making great functional progress.  Pt with difficulty with ankle mobility when ambulating without device due to ankle edema and demonstrates reduced muscle recruitment when not using cane.  PT provided cueing to improve heel to toe gait and improved trunk rotation.  Pt requires tactile and verbal cues for quad recruitment with SLR and long arc quads.  Edema persists thoughout Lt LE with pitting.    Pt will continue to benefit from  skilled PT for post-op recovery of ROM, edema management, strength and return to functional activity.    PT Frequency  3x / week    PT Duration  8 weeks    PT Treatment/Interventions  ADLs/Self Care Home Management;Aquatic Therapy;Electrical Stimulation;Cryotherapy;DME Instruction;Gait training;Stair training;Functional mobility training;Therapeutic activities;Therapeutic exercise;Balance training;Neuromuscular re-education;Manual techniques;Patient/family education;Passive range of motion;Dry needling;Taping;Vasopneumatic Device;Joint Manipulations    PT Next Visit Plan  Lt knee edema management, bike or NuStep, ROM,  strength, gait training, vaso    Consulted and Agree with Plan of Care  Patient       Patient will benefit from skilled therapeutic intervention in order to improve the following deficits and impairments:  Abnormal gait, Decreased range of motion, Difficulty walking, Decreased endurance, Pain, Decreased scar mobility, Impaired flexibility, Hypomobility, Decreased mobility, Decreased strength, Increased edema  Visit Diagnosis: Left knee pain, unspecified chronicity  Muscle weakness (generalized)  Stiffness of left knee, not elsewhere classified  Localized edema  Other abnormalities of gait and mobility     Problem List Patient Active Problem List   Diagnosis Date Noted  . Hypokalemia 06/27/2018  . Overweight (BMI 25.0-29.9) 06/27/2018  . S/P right TKA 06/26/2018  . Autoimmune disease ([place]) 03/15/2016  . High risk medication use 03/15/2016  . Pain in joint of right shoulder 03/15/2016  . Bilateral hip pain 03/15/2016  . Primary osteoarthritis of both knees 03/15/2016  . 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 Arthritis 02/24/2014  . HTN (hypertension) 02/24/2014  . Hyperlipidemia 02/24/2014  . Breast cancer, right, upper outer quadrant  07/05/2010     Sigurd Sos, PT 04/08/19 1:17 PM  [facility] [address], [place], [place], [address] Phone: [phone]   Fax:  [phone]  Name: Yolanda Peters MRN: [id number] Date of Birth: [birth year]

## 2019-04-10 ENCOUNTER — Encounter: Payer: Self-pay | Admitting: Physical Therapy

## 2019-04-10 ENCOUNTER — Ambulatory Visit: Payer: Medicare Other | Admitting: Physical Therapy

## 2019-04-10 ENCOUNTER — Other Ambulatory Visit: Payer: Self-pay

## 2019-04-10 DIAGNOSIS — M25562 Pain in left knee: Secondary | ICD-10-CM | POA: Diagnosis not present

## 2019-04-10 DIAGNOSIS — M6281 Muscle weakness (generalized): Secondary | ICD-10-CM

## 2019-04-10 DIAGNOSIS — R600 Localized edema: Secondary | ICD-10-CM

## 2019-04-10 DIAGNOSIS — R2689 Other abnormalities of gait and mobility: Secondary | ICD-10-CM

## 2019-04-10 DIAGNOSIS — M25662 Stiffness of left knee, not elsewhere classified: Secondary | ICD-10-CM

## 2019-04-10 NOTE — Therapy (Signed)
[facility] [address] [place], [place], [address] Phone: [phone]   Fax:  [phone]  Physical Therapy Treatment  Patient Details  Name: Yolanda Peters MRN: [id number] Date of Birth: 1940/06/07 Referring Provider (PT): Dr. Rema Jasmine   Encounter Date: 04/10/2019  PT End of Session - 04/10/19 1528    Visit Number  5    Date for PT Re-Evaluation  05/27/19    Authorization Type  UHC Medicare    Progress Note Due on Visit  10    PT Start Time  1436    PT Stop Time  1545    PT Time Calculation (min)  69 min    Activity Tolerance  Patient tolerated treatment well    Behavior During Therapy  [facility] for tasks assessed/performed       Past Medical History:  Diagnosis Date  . Allergy   . Arthritis   . Breast cancer [facility]) 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  . Cancer ([place])    right breast   . Hyperlipidemia    takes Niacin  . Hypertension   . Personal history of radiation therapy 2011   rt breast    Past Surgical History:  Procedure Laterality Date  . BREAST DUCTAL SYSTEM EXCISION     right breast  . BREAST LUMPECTOMY    . BREAST SURGERY  august 2011   right partial mastectomy  . CATARACT EXTRACTION     bilateral  . CESAREAN SECTION    . ELBOW SURGERY     left  . JOINT REPLACEMENT    . KNEE ARTHROPLASTY    . MYOMECTOMY    . TOTAL KNEE ARTHROPLASTY Right 06/26/2018   Procedure: TOTAL KNEE ARTHROPLASTY;  Surgeon: Paralee Cancel, MD;  Location: [facility];  Service: Orthopedics;  Laterality: Right;  70 mins    There were no vitals filed for this visit.  Subjective Assessment - 04/10/19 1512    Subjective  Saw PA today. I am doing great.    Currently in Pain?  No/denies                       [facility] Adult PT Treatment/Exercise - 04/10/19 0001      Knee/Hip Exercises: Aerobic   Nustep  L1 x 10 min      Knee/Hip Exercises: Standing   Stairs  4 stairs reciprocol with Bil hand rails     SLS  Lt  with RT toe taps on steps 2x15     Rebounder  weight shift 3 ways x 1 minute each      Knee/Hip Exercises: Seated   Long Arc Quad  AROM;Strengthening;Left;2 sets;10 reps    Long [employer] Limitations  5 second hold with quad cueing      Vasopneumatic   Number Minutes Vasopneumatic   15 minutes    Vasopnuematic Location   Knee    Vasopneumatic Pressure  Medium    Vasopneumatic Temperature   three snowflakes      Manual Therapy   Manual Therapy  Edema management    Edema Management  retrograde massage Lt leg and knee, working around anterior bandaging               PT Short Term Goals - 04/10/19 1509      PT SHORT TERM GOAL #4   Title  Pt will be able to perform light household duties x 30 min without exacerbation of Lt knee pain.  Time  4    Period  Weeks    Status  Achieved      PT SHORT TERM GOAL #5   Title  Pt will be ind with dressing Lt LE, with bathing (able to step over 14" tub ledge), and report greater ease with toilet transfers.    Time  4    Status  Achieved    Target Date  04/29/19        PT Long Term Goals - 04/01/19 1305      PT LONG TERM GOAL #1   Title  Pt will be ind in advanced HEP and understand how to safely progress.    Time  8    Period  Weeks    Status  New    Target Date  05/27/19      PT LONG TERM GOAL #2   Title  reduce FOTO to </= 33% to demo less limitation    Baseline  62%    Time  8    Period  Weeks    Status  New    Target Date  05/27/19      PT LONG TERM GOAL #3   Title  Pt will be able to perform community activities >/= 30 min without AD or with LRAD with symmetical gait pattern.    Time  8    Period  Weeks    Status  New    Target Date  05/27/19      PT LONG TERM GOAL #4   Title  Pt will achieve strength in Lt LE (hip, knee and ankle) of at least 4+/5 to improve functional tasks such as gait endurance, stairs, transfers.    Time  8    Period  Weeks    Status  New    Target Date  05/27/19      PT LONG TERM  GOAL #5   Title  Pt will ambulate >/= 500' with 6 min walk test using LRAD or without AD.    Time  8    Period  Weeks    Status  New    Target Date  05/27/19            Plan - 04/10/19 1511    Clinical Impression Statement  Pt has met all her short term goals as of today.  Conitnues with edema throughout the LTLE and skin continues with red color. Pt was able to do stairs in both directions reciprocally, slowly with Bil hand rails.    Personal Factors and Comorbidities  Time since onset of injury/illness/exacerbation    Examination-Activity Limitations  Transfers;Bend;Locomotion Level;Toileting;Stairs    Examination-Participation Restrictions  Driving;Community Activity;Cleaning;Meal Prep;Laundry    Stability/Clinical Decision Making  Stable/Uncomplicated    Rehab Potential  Good    PT Frequency  3x / week    PT Duration  8 weeks    PT Treatment/Interventions  ADLs/Self Care Home Management;Aquatic Therapy;Electrical Stimulation;Cryotherapy;DME Instruction;Gait training;Stair training;Functional mobility training;Therapeutic activities;Therapeutic exercise;Balance training;Neuromuscular re-education;Manual techniques;Patient/family education;Passive range of motion;Dry needling;Taping;Vasopneumatic Device;Joint Manipulations    PT Next Visit Plan  Lt knee edema management, bike or NuStep, ROM, strength, gait training, vaso    PT Home Exercise Plan  no Medbridge yet, going off protocol from MD    Consulted and Agree with Plan of Care  Patient       Patient will benefit from skilled therapeutic intervention in order to improve the following deficits and impairments:  Abnormal gait, Decreased range of motion, Difficulty  walking, Decreased endurance, Pain, Decreased scar mobility, Impaired flexibility, Hypomobility, Decreased mobility, Decreased strength, Increased edema  Visit Diagnosis: Left knee pain, unspecified chronicity  Muscle weakness (generalized)  Stiffness of left knee,  not elsewhere classified  Localized edema  Other abnormalities of gait and mobility     Problem List Patient Active Problem List   Diagnosis Date Noted  . Hypokalemia 06/27/2018  . Overweight (BMI 25.0-29.9) 06/27/2018  . S/P right TKA 06/26/2018  . Autoimmune disease ([place]) 03/15/2016  . High risk medication use 03/15/2016  . Pain in joint of right shoulder 03/15/2016  . Bilateral hip pain 03/15/2016  . Primary osteoarthritis of both knees 03/15/2016  . 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 Arthritis 02/24/2014  . HTN (hypertension) 02/24/2014  . Hyperlipidemia 02/24/2014  . Breast cancer, right, upper outer quadrant  07/05/2010    Velma Hanna, PTA 04/10/2019, 3:52 PM  [facility] [address] [place], [place], [address] Phone: [phone]   Fax:  [phone]  Name: Yolanda Peters MRN: [id number] Date of Birth: 07/24/1940

## 2019-04-15 ENCOUNTER — Encounter: Payer: Self-pay | Admitting: Physical Therapy

## 2019-04-15 ENCOUNTER — Ambulatory Visit: Payer: Medicare Other | Attending: Orthopaedic Surgery | Admitting: Physical Therapy

## 2019-04-15 ENCOUNTER — Other Ambulatory Visit: Payer: Self-pay

## 2019-04-15 DIAGNOSIS — M25562 Pain in left knee: Secondary | ICD-10-CM | POA: Diagnosis not present

## 2019-04-15 DIAGNOSIS — R600 Localized edema: Secondary | ICD-10-CM | POA: Diagnosis present

## 2019-04-15 DIAGNOSIS — R2689 Other abnormalities of gait and mobility: Secondary | ICD-10-CM

## 2019-04-15 DIAGNOSIS — M25662 Stiffness of left knee, not elsewhere classified: Secondary | ICD-10-CM

## 2019-04-15 DIAGNOSIS — M6281 Muscle weakness (generalized): Secondary | ICD-10-CM | POA: Diagnosis present

## 2019-04-15 NOTE — Therapy (Signed)
[facility] [address] [place], [place], [address] Phone: [phone]   Fax:  [phone]  Physical Therapy Treatment  Patient Details  Name: Yolanda Peters MRN: [id number] Date of Birth: 04-27-1940 Referring Provider (PT): Dr. Rema Jasmine   Encounter Date: 04/15/2019  PT End of Session - 04/15/19 0943    Visit Number  6    Date for PT Re-Evaluation  05/27/19    Authorization Type  UHC Medicare    PT Start Time  0930    PT Stop Time  1030    PT Time Calculation (min)  60 min    Activity Tolerance  Patient tolerated treatment well    Behavior During Therapy  [facility] for tasks assessed/performed       Past Medical History:  Diagnosis Date  . Allergy   . Arthritis   . Breast cancer [facility]) 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  . Cancer ([place])    right breast   . Hyperlipidemia    takes Niacin  . Hypertension   . Personal history of radiation therapy 2011   rt breast    Past Surgical History:  Procedure Laterality Date  . BREAST DUCTAL SYSTEM EXCISION     right breast  . BREAST LUMPECTOMY    . BREAST SURGERY  august 2011   right partial mastectomy  . CATARACT EXTRACTION     bilateral  . CESAREAN SECTION    . ELBOW SURGERY     left  . JOINT REPLACEMENT    . KNEE ARTHROPLASTY    . MYOMECTOMY    . TOTAL KNEE ARTHROPLASTY Right 06/26/2018   Procedure: TOTAL KNEE ARTHROPLASTY;  Surgeon: Paralee Cancel, MD;  Location: [facility];  Service: Orthopedics;  Laterality: Right;  70 mins    There were no vitals filed for this visit.  Subjective Assessment - 04/15/19 0940    Subjective  I am doing some aspect of my HEP every day.  I don't use the cane anymore at home but bring it with me in the community for curbs.    Pertinent History  Rt TKR last year    Limitations  House hold activities;Standing;Walking;Other (comment)    How long can you stand comfortably?  20+    How long can you walk comfortably?  household activity x up  to 4 hours    Patient Stated Goals  go down 5 back steps with 2 rails to access backyard, be able to drive by early April    Currently in Pain?  No/denies   heaviness   Pain Orientation  Left;Anterior    Pain Onset  More than a month ago    Pain Frequency  Intermittent                       [facility] Adult PT Treatment/Exercise - 04/15/19 0001      Exercises   Exercises  Knee/Hip      Knee/Hip Exercises: Aerobic   Nustep  L3 x 8', PT present to review status and HEP      Knee/Hip Exercises: Standing   Hip Extension  Stengthening;Left;Knee straight;1 set;10 reps    Forward Step Up  Left;10 reps;Hand Hold: 2   toe tap 2nd step Rt LE   Other Standing Knee Exercises  10x3 sec weight shift into Lt LE PT cued tall through hip and knee x 3 sec each      Knee/Hip Exercises: Seated   Knee/Hip  Flexion  Lt knee flexion with floor slider 15x3 sec hold      Knee/Hip Exercises: Supine   Bridges  Strengthening;Both;15 reps    Straight Leg Raises  Strengthening;Left;2 sets;10 reps      Vasopneumatic   Number Minutes Vasopneumatic   15 minutes    Vasopnuematic Location   Knee    Vasopneumatic Pressure  Medium    Vasopneumatic Temperature   three snowflakes      Manual Therapy   Manual Therapy  Edema management;Joint mobilization;Soft tissue mobilization    Edema Management  retrograde massage from ankle to superior knee on Lt    Soft tissue mobilization  Scar mobilization, care taken around areas still scabbed over, instructions for scar mobs verbally given and demo'd               PT Short Term Goals - 04/10/19 1509      PT SHORT TERM GOAL #4   Title  Pt will be able to perform light household duties x 30 min without exacerbation of Lt knee pain.    Time  4    Period  Weeks    Status  Achieved      PT SHORT TERM GOAL #5   Title  Pt will be ind with dressing Lt LE, with bathing (able to step over 14" tub ledge), and report greater ease with toilet transfers.     Time  4    Status  Achieved    Target Date  04/29/19        PT Long Term Goals - 04/01/19 1305      PT LONG TERM GOAL #1   Title  Pt will be ind in advanced HEP and understand how to safely progress.    Time  8    Period  Weeks    Status  New    Target Date  05/27/19      PT LONG TERM GOAL #2   Title  reduce FOTO to </= 33% to demo less limitation    Baseline  62%    Time  8    Period  Weeks    Status  New    Target Date  05/27/19      PT LONG TERM GOAL #3   Title  Pt will be able to perform community activities >/= 30 min without AD or with LRAD with symmetical gait pattern.    Time  8    Period  Weeks    Status  New    Target Date  05/27/19      PT LONG TERM GOAL #4   Title  Pt will achieve strength in Lt LE (hip, knee and ankle) of at least 4+/5 to improve functional tasks such as gait endurance, stairs, transfers.    Time  8    Period  Weeks    Status  New    Target Date  05/27/19      PT LONG TERM GOAL #5   Title  Pt will ambulate >/= 500' with 6 min walk test using LRAD or without AD.    Time  8    Period  Weeks    Status  New    Target Date  05/27/19            Plan - 04/15/19 1213    Clinical Impression Statement  Pt with improved gait pattern today with improved weight transfer and knee flexion, without use of AD.  She has improved levels  of LE edema today compared to previous visits, having used compression sock over the weekend and focusing more on elevating the LE.  PT spent time on manual techniques for edema management, patellar mobs, and scar mobs using care around areas with healing scabbing.  PT instructed Pt on scar care and mobs.  Pt with improving VMO recruitment in open and closed chain exercise.  She will continue to benefit from skilled PT along current POC.    Rehab Potential  Good    PT Frequency  3x / week    PT Duration  8 weeks    PT Treatment/Interventions  ADLs/Self Care Home Management;Aquatic Therapy;Electrical  Stimulation;Cryotherapy;DME Instruction;Gait training;Stair training;Functional mobility training;Therapeutic activities;Therapeutic exercise;Balance training;Neuromuscular re-education;Manual techniques;Patient/family education;Passive range of motion;Dry needling;Taping;Vasopneumatic Device;Joint Manipulations    PT Next Visit Plan  Lt knee edema management, bike or NuStep, ROM, strength, gait training, vaso    PT Home Exercise Plan  no Medbridge yet, going off protocol from MD    Recommended Other Services  taper to 2x/week when appropriate    Consulted and Agree with Plan of Care  Patient       Patient will benefit from skilled therapeutic intervention in order to improve the following deficits and impairments:     Visit Diagnosis: Left knee pain, unspecified chronicity  Muscle weakness (generalized)  Stiffness of left knee, not elsewhere classified  Localized edema  Other abnormalities of gait and mobility     Problem List Patient Active Problem List   Diagnosis Date Noted  . Hypokalemia 06/27/2018  . Overweight (BMI 25.0-29.9) 06/27/2018  . S/P right TKA 06/26/2018  . Autoimmune disease ([place]) 03/15/2016  . High risk medication use 03/15/2016  . Pain in joint of right shoulder 03/15/2016  . Bilateral hip pain 03/15/2016  . Primary osteoarthritis of both knees 03/15/2016  . 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 Arthritis 02/24/2014  . HTN (hypertension) 02/24/2014  . Hyperlipidemia 02/24/2014  . Breast cancer, right, upper outer quadrant  07/05/2010   Baruch Merl, PT 04/15/19 12:17 PM   [facility] [address], [place], [place], [address] Phone: [phone]   Fax:  [phone]  Name: JARITA TOOMES MRN: [id number] Date of Birth: Aug 01, 1940

## 2019-04-17 ENCOUNTER — Encounter: Payer: Self-pay | Admitting: Physical Therapy

## 2019-04-17 ENCOUNTER — Other Ambulatory Visit: Payer: Self-pay

## 2019-04-17 ENCOUNTER — Ambulatory Visit: Payer: Medicare Other | Admitting: Physical Therapy

## 2019-04-17 DIAGNOSIS — M25662 Stiffness of left knee, not elsewhere classified: Secondary | ICD-10-CM

## 2019-04-17 DIAGNOSIS — M6281 Muscle weakness (generalized): Secondary | ICD-10-CM

## 2019-04-17 DIAGNOSIS — M25562 Pain in left knee: Secondary | ICD-10-CM

## 2019-04-17 DIAGNOSIS — R600 Localized edema: Secondary | ICD-10-CM

## 2019-04-17 NOTE — Therapy (Signed)
[facility] [address], [place], [place], [address] Phone: [phone]   Fax:  [phone]  Physical Therapy Treatment  Patient Details  Name: Yolanda Peters MRN: [id number] Date of Birth: May 23, 1940 Referring Provider (PT): Dr. Rema Jasmine   Encounter Date: 04/17/2019  PT End of Session - 04/17/19 1236    Visit Number  7    Date for PT Re-Evaluation  05/27/19    Authorization Type  UHC Medicare    Progress Note Due on Visit  10    PT Start Time  1230    PT Stop Time  1322    PT Time Calculation (min)  52 min       Past Medical History:  Diagnosis Date  . Allergy   . Arthritis   . Breast cancer [facility]) 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  . Cancer ([place])    right breast   . Hyperlipidemia    takes Niacin  . Hypertension   . Personal history of radiation therapy 2011   rt breast    Past Surgical History:  Procedure Laterality Date  . BREAST DUCTAL SYSTEM EXCISION     right breast  . BREAST LUMPECTOMY    . BREAST SURGERY  august 2011   right partial mastectomy  . CATARACT EXTRACTION     bilateral  . CESAREAN SECTION    . ELBOW SURGERY     left  . JOINT REPLACEMENT    . KNEE ARTHROPLASTY    . MYOMECTOMY    . TOTAL KNEE ARTHROPLASTY Right 06/26/2018   Procedure: TOTAL KNEE ARTHROPLASTY;  Surgeon: Paralee Cancel, MD;  Location: [facility];  Service: Orthopedics;  Laterality: Right;  70 mins    There were no vitals filed for this visit.  Subjective Assessment - 04/17/19 1234    Subjective  I had a lot of pain last night and it kept me up.    Currently in Pain?  Yes    Pain Score  3     Pain Location  Knee    Pain Orientation  Left    Pain Descriptors / Indicators  Aching    Pain Type  Surgical pain    Pain Onset  More than a month ago    Pain Frequency  Intermittent    Multiple Pain Sites  No         [facility] PT Assessment - 04/17/19 0001      AROM   Left Knee Flexion  120                    [facility] Adult PT Treatment/Exercise - 04/17/19 0001      Knee/Hip Exercises: Aerobic   Nustep  L3 x 10', PT present to review status and HEP      Knee/Hip Exercises: Standing   Knee Flexion  Strengthening;Both;10 reps   alt LE with 2lb   Hip Extension  Stengthening;Left;Knee straight;1 set;10 reps    Forward Step Up  Left;10 reps;Hand Hold: 2      Knee/Hip Exercises: Seated   Sit to Sand  5 reps;2 sets;without UE support   mat plus foam pad     Vasopneumatic   Number Minutes Vasopneumatic   15 minutes    Vasopnuematic Location   Knee    Vasopneumatic Pressure  Medium    Vasopneumatic Temperature   three snowflakes      Manual Therapy   Manual Therapy  Edema management;Joint mobilization;Soft tissue mobilization  Edema Management  retrograde massage from ankle to superior knee on Lt    Soft tissue mobilization  Scar mobilization, care taken around areas still scabbed over, reviewed instructions               PT Short Term Goals - 04/10/19 1509      PT SHORT TERM GOAL #4   Title  Pt will be able to perform light household duties x 30 min without exacerbation of Lt knee pain.    Time  4    Period  Weeks    Status  Achieved      PT SHORT TERM GOAL #5   Title  Pt will be ind with dressing Lt LE, with bathing (able to step over 14" tub ledge), and report greater ease with toilet transfers.    Time  4    Status  Achieved    Target Date  04/29/19        PT Long Term Goals - 04/01/19 1305      PT LONG TERM GOAL #1   Title  Pt will be ind in advanced HEP and understand how to safely progress.    Time  8    Period  Weeks    Status  New    Target Date  05/27/19      PT LONG TERM GOAL #2   Title  reduce FOTO to </= 33% to demo less limitation    Baseline  62%    Time  8    Period  Weeks    Status  New    Target Date  05/27/19      PT LONG TERM GOAL #3   Title  Pt will be able to perform community activities >/= 30 min without AD  or with LRAD with symmetical gait pattern.    Time  8    Period  Weeks    Status  New    Target Date  05/27/19      PT LONG TERM GOAL #4   Title  Pt will achieve strength in Lt LE (hip, knee and ankle) of at least 4+/5 to improve functional tasks such as gait endurance, stairs, transfers.    Time  8    Period  Weeks    Status  New    Target Date  05/27/19      PT LONG TERM GOAL #5   Title  Pt will ambulate >/= 500' with 6 min walk test using LRAD or without AD.    Time  8    Period  Weeks    Status  New    Target Date  05/27/19            Plan - 04/17/19 1311    Clinical Impression Statement  Pt continues to demonstrate increased quad strength with progression of exercise difficulty.  Pt had 120 deg of knee flexion today. Pt will felt relief with massage and vaso.  She is still needing this for edema management.  Pt will benefit from skilled PT to progress with POC.    PT Treatment/Interventions  ADLs/Self Care Home Management;Aquatic Therapy;Electrical Stimulation;Cryotherapy;DME Instruction;Gait training;Stair training;Functional mobility training;Therapeutic activities;Therapeutic exercise;Balance training;Neuromuscular re-education;Manual techniques;Patient/family education;Passive range of motion;Dry needling;Taping;Vasopneumatic Device;Joint Manipulations    PT Next Visit Plan  Lt knee edema management, bike or NuStep, ROM, strength, gait training, vaso    Consulted and Agree with Plan of Care  Patient       Patient will benefit from skilled therapeutic  intervention in order to improve the following deficits and impairments:  Abnormal gait, Decreased range of motion, Difficulty walking, Decreased endurance, Pain, Decreased scar mobility, Impaired flexibility, Hypomobility, Decreased mobility, Decreased strength, Increased edema  Visit Diagnosis: Left knee pain, unspecified chronicity  Muscle weakness (generalized)  Stiffness of left knee, not elsewhere  classified  Localized edema     Problem List Patient Active Problem List   Diagnosis Date Noted  . Hypokalemia 06/27/2018  . Overweight (BMI 25.0-29.9) 06/27/2018  . S/P right TKA 06/26/2018  . Autoimmune disease ([place]) 03/15/2016  . High risk medication use 03/15/2016  . Pain in joint of right shoulder 03/15/2016  . Bilateral hip pain 03/15/2016  . Primary osteoarthritis of both knees 03/15/2016  . 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 Arthritis 02/24/2014  . HTN (hypertension) 02/24/2014  . Hyperlipidemia 02/24/2014  . Breast cancer, right, upper outer quadrant  07/05/2010    Jule Ser, PT 04/17/2019, 1:14 PM  [facility] [address] [place], [place], [address] Phone: [phone]   Fax:  [phone]  Name: Yolanda Peters MRN: [id number] Date of Birth: 02/01/1940

## 2019-04-19 ENCOUNTER — Other Ambulatory Visit: Payer: Self-pay

## 2019-04-19 ENCOUNTER — Ambulatory Visit: Payer: Medicare Other | Admitting: Physical Therapy

## 2019-04-19 DIAGNOSIS — M25562 Pain in left knee: Secondary | ICD-10-CM | POA: Diagnosis not present

## 2019-04-19 DIAGNOSIS — R600 Localized edema: Secondary | ICD-10-CM

## 2019-04-19 DIAGNOSIS — M25662 Stiffness of left knee, not elsewhere classified: Secondary | ICD-10-CM

## 2019-04-19 DIAGNOSIS — M6281 Muscle weakness (generalized): Secondary | ICD-10-CM

## 2019-04-19 DIAGNOSIS — R2689 Other abnormalities of gait and mobility: Secondary | ICD-10-CM

## 2019-04-19 NOTE — Therapy (Signed)
[facility] [address] [place], [place], [address] Phone: [phone]   Fax:  [phone]  Physical Therapy Treatment  Patient Details  Name: Yolanda Peters MRN: [id number] Date of Birth: 10-Aug-1940 Referring Provider (PT): Dr. Rema Jasmine   Encounter Date: 04/19/2019  PT End of Session - 04/19/19 1049    Visit Number  8    Date for PT Re-Evaluation  05/27/19    Authorization Type  UHC Medicare    Progress Note Due on Visit  10    PT Start Time  1010    PT Stop Time  1100    PT Time Calculation (min)  50 min    Activity Tolerance  Patient tolerated treatment well       Past Medical History:  Diagnosis Date  . Allergy   . Arthritis   . Breast cancer [facility]) 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  . Cancer ([place])    right breast   . Hyperlipidemia    takes Niacin  . Hypertension   . Personal history of radiation therapy 2011   rt breast    Past Surgical History:  Procedure Laterality Date  . BREAST DUCTAL SYSTEM EXCISION     right breast  . BREAST LUMPECTOMY    . BREAST SURGERY  august 2011   right partial mastectomy  . CATARACT EXTRACTION     bilateral  . CESAREAN SECTION    . ELBOW SURGERY     left  . JOINT REPLACEMENT    . KNEE ARTHROPLASTY    . MYOMECTOMY    . TOTAL KNEE ARTHROPLASTY Right 06/26/2018   Procedure: TOTAL KNEE ARTHROPLASTY;  Surgeon: Paralee Cancel, MD;  Location: [facility];  Service: Orthopedics;  Laterality: Right;  70 mins    There were no vitals filed for this visit.  Subjective Assessment - 04/19/19 1009    Subjective  Those nerve endings are waking up at night time.    Pertinent History  Rt TKR last year    Limitations  House hold activities;Standing;Walking;Other (comment)    Patient Stated Goals  go down 5 back steps with 2 rails to access backyard, be able to drive by early April    Currently in Pain?  Yes    Pain Score  3     Pain Location  Knee    Pain Orientation  Left    Pain  Type  Surgical pain                       [facility] Adult PT Treatment/Exercise - 04/19/19 0001      Knee/Hip Exercises: Stretches   Active Hamstring Stretch  Left;5 reps    Active Hamstring Stretch Limitations  on 2nd step     Knee: Self-Stretch to increase Flexion  5 reps    Knee: Self-Stretch Limitations  on 2nd step       Knee/Hip Exercises: Aerobic   Nustep  L3 LEs only; L4 1 minute LEs only       Knee/Hip Exercises: Machines for Strengthening   Cybex Leg Press  seat 8 60# 20x  emphasizing terminal knee extension       Knee/Hip Exercises: Standing   Forward Step Up  Left;10 reps;Hand Hold: 2    Other Standing Knee Exercises  WB on left with right step taps 10x     Other Standing Knee Exercises  retro stepping 2 minutes       Knee/Hip  Exercises: Seated   Other Seated Knee/Hip Exercises  Short arcs emphasizing terminal knee extension 2x 10       Vasopneumatic   Number Minutes Vasopneumatic   15 minutes    Vasopnuematic Location   Knee    Vasopneumatic Pressure  Medium    Vasopneumatic Temperature   three snowflakes      Manual Therapy   Edema Management  retrograde massage lower leg and calf and HS                PT Short Term Goals - 04/19/19 1055      PT SHORT TERM GOAL #1   Title  Pt will be compliant and ind with initial HEP and edema management strategies    Status  Achieved      PT SHORT TERM GOAL #2   Title  Pt will wean to single point cane for household and short community distances.    Status  Achieved      PT SHORT TERM GOAL #3   Title  Pt will achieve Lt knee ROM 0-120 deg per surgeon's protocol.    Status  Partially Met      PT SHORT TERM GOAL #4   Title  Pt will be able to perform light household duties x 30 min without exacerbation of Lt knee pain.    Status  Achieved      PT SHORT TERM GOAL #5   Title  Pt will be ind with dressing Lt LE, with bathing (able to step over 14" tub ledge), and report greater ease with toilet  transfers.    Status  Achieved        PT Long Term Goals - 04/01/19 1305      PT LONG TERM GOAL #1   Title  Pt will be ind in advanced HEP and understand how to safely progress.    Time  8    Period  Weeks    Status  New    Target Date  05/27/19      PT LONG TERM GOAL #2   Title  reduce FOTO to </= 33% to demo less limitation    Baseline  62%    Time  8    Period  Weeks    Status  New    Target Date  05/27/19      PT LONG TERM GOAL #3   Title  Pt will be able to perform community activities >/= 30 min without AD or with LRAD with symmetical gait pattern.    Time  8    Period  Weeks    Status  New    Target Date  05/27/19      PT LONG TERM GOAL #4   Title  Pt will achieve strength in Lt LE (hip, knee and ankle) of at least 4+/5 to improve functional tasks such as gait endurance, stairs, transfers.    Time  8    Period  Weeks    Status  New    Target Date  05/27/19      PT LONG TERM GOAL #5   Title  Pt will ambulate >/= 500' with 6 min walk test using LRAD or without AD.    Time  8    Period  Weeks    Status  New    Target Date  05/27/19            Plan - 04/19/19 1050    Clinical Impression Statement  The  patient is progressing well with knee flexion ROM.  Fair to good passive knee extension however quad muscle weakness limits terminal knee extension.  Verbal and tactile cues to activate quads which was focus of treatment today.  Improving edema and lower leg soft tissue mobility.  Progressing with rehab goals.    Examination-Activity Limitations  Transfers;Bend;Locomotion Level;Toileting;Stairs    Examination-Participation Restrictions  Driving;Community Activity;Cleaning;Meal Prep;Laundry    Rehab Potential  Good    PT Frequency  3x / week    PT Duration  8 weeks    PT Treatment/Interventions  ADLs/Self Care Home Management;Aquatic Therapy;Electrical Stimulation;Cryotherapy;DME Instruction;Gait training;Stair training;Functional mobility training;Therapeutic  activities;Therapeutic exercise;Balance training;Neuromuscular re-education;Manual techniques;Patient/family education;Passive range of motion;Dry needling;Taping;Vasopneumatic Device;Joint Manipulations    PT Next Visit Plan  Lt knee edema management, bike or NuStep, ROM, quad strength especially end range, gait training, vaso       Patient will benefit from skilled therapeutic intervention in order to improve the following deficits and impairments:  Abnormal gait, Decreased range of motion, Difficulty walking, Decreased endurance, Pain, Decreased scar mobility, Impaired flexibility, Hypomobility, Decreased mobility, Decreased strength, Increased edema  Visit Diagnosis: Left knee pain, unspecified chronicity  Muscle weakness (generalized)  Stiffness of left knee, not elsewhere classified  Localized edema  Other abnormalities of gait and mobility     Problem List Patient Active Problem List   Diagnosis Date Noted  . Hypokalemia 06/27/2018  . Overweight (BMI 25.0-29.9) 06/27/2018  . S/P right TKA 06/26/2018  . Autoimmune disease ([place]) 03/15/2016  . High risk medication use 03/15/2016  . Pain in joint of right shoulder 03/15/2016  . Bilateral hip pain 03/15/2016  . Primary osteoarthritis of both knees 03/15/2016  . 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 Arthritis 02/24/2014  . HTN (hypertension) 02/24/2014  . Hyperlipidemia 02/24/2014  . Breast cancer, right, upper outer quadrant  07/05/2010   Ruben Im, PT 04/19/19 10:59 AM Phone: [phone] Fax: [phone] Alvera Singh 04/19/2019, 10:59 AM  [facility] [address], [place], [place], [address] Phone: [phone]   Fax:  [phone]  Name: ALUNA WHISTON MRN: [id number] Date of Birth: 10-Jun-1940

## 2019-04-22 ENCOUNTER — Ambulatory Visit: Payer: Medicare Other | Admitting: Physical Therapy

## 2019-04-22 ENCOUNTER — Encounter: Payer: Self-pay | Admitting: Physical Therapy

## 2019-04-22 ENCOUNTER — Other Ambulatory Visit: Payer: Self-pay

## 2019-04-22 DIAGNOSIS — M25562 Pain in left knee: Secondary | ICD-10-CM

## 2019-04-22 DIAGNOSIS — R2689 Other abnormalities of gait and mobility: Secondary | ICD-10-CM

## 2019-04-22 DIAGNOSIS — M6281 Muscle weakness (generalized): Secondary | ICD-10-CM

## 2019-04-22 DIAGNOSIS — R600 Localized edema: Secondary | ICD-10-CM

## 2019-04-22 DIAGNOSIS — M25662 Stiffness of left knee, not elsewhere classified: Secondary | ICD-10-CM

## 2019-04-22 NOTE — Therapy (Signed)
[facility] [address], [place], [place], [address] Phone: [phone]   Fax:  [phone]  Physical Therapy Treatment  Patient Details  Name: Yolanda Peters MRN: [id number] Date of Birth: August 29, 1940 Referring Provider (PT): Dr. Rema Jasmine   Encounter Date: 04/22/2019  PT End of Session - 04/22/19 1014    Visit Number  9    Date for PT Re-Evaluation  05/27/19    Authorization Type  UHC Medicare    Progress Note Due on Visit  10    PT Start Time  0935    PT Stop Time  1030    PT Time Calculation (min)  55 min    Activity Tolerance  Patient tolerated treatment well    Behavior During Therapy  [facility] for tasks assessed/performed       Past Medical History:  Diagnosis Date  . Allergy   . Arthritis   . Breast cancer [facility]) 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  . Cancer ([place])    right breast   . Hyperlipidemia    takes Niacin  . Hypertension   . Personal history of radiation therapy 2011   rt breast    Past Surgical History:  Procedure Laterality Date  . BREAST DUCTAL SYSTEM EXCISION     right breast  . BREAST LUMPECTOMY    . BREAST SURGERY  august 2011   right partial mastectomy  . CATARACT EXTRACTION     bilateral  . CESAREAN SECTION    . ELBOW SURGERY     left  . JOINT REPLACEMENT    . KNEE ARTHROPLASTY    . MYOMECTOMY    . TOTAL KNEE ARTHROPLASTY Right 06/26/2018   Procedure: TOTAL KNEE ARTHROPLASTY;  Surgeon: Paralee Cancel, MD;  [facility]: [facility];  Service: Orthopedics;  Laterality: Right;  70 mins    There were no vitals filed for this visit.  Subjective Assessment - 04/22/19 0938    Subjective  Lt knee nerve endings didn't wake me up last night - much better than two previous nights.    Pertinent History  Rt TKR last year    Limitations  House hold activities;Standing;Walking;Other (comment)    How long can you stand comfortably?  20+    How long can you walk comfortably?  household activity x up to 4  hours    Patient Stated Goals  go down 5 back steps with 2 rails to access backyard, be able to drive by early April    Currently in Pain?  Yes    Pain Score  2     Pain Location  Knee    Pain Orientation  Left    Pain Descriptors / Indicators  Aching    Pain Type  Surgical pain    Pain Onset  More than a month ago    Pain Frequency  Intermittent    Aggravating Factors   walking/stand >20 min    Pain Relieving Factors  meds, ice, stretching    Effect of Pain on Daily Activities  dressing, driving, community amb                       [facility] Adult PT Treatment/Exercise - 04/22/19 0001      Knee/Hip Exercises: Stretches   Knee: Self-Stretch to increase Flexion  5 reps;Left    Knee: Self-Stretch Limitations  on 2nd step     Gastroc Stretch  Left;2 reps;30 seconds    Gastroc Stretch  Limitations  slant board      Knee/Hip Exercises: Aerobic   Nustep  L3 arms/legs x 3', 5' legs only, SPM goal b/w 70-80+, PT present to discuss activity level and tolerance      Knee/Hip Exercises: Machines for Strengthening   Cybex Leg Press  seat 8 60# 20x bil emphasizing terminal knee extension       Knee/Hip Exercises: Standing   Terminal Knee Extension  Strengthening;Left;20 reps;Theraband    Theraband Level (Terminal Knee Extension)  Level 1 (Yellow)    Terminal Knee Extension Limitations  hold x 3 sec each    Hip Abduction  Stengthening;Left;1 set;10 reps;Knee straight    Forward Step Up  10 reps;Hand Hold: 1;Step Height: 4"    Forward Step Up Limitations  tried 6" but signif compensation    SLS with Vectors  stand on Lt LE toe taps and stepping fwd/lat/bwd x 10 cycles each   emphasis on Lt terminal knee ext/quad activation     Knee/Hip Exercises: Seated   Long Arc Quad  Strengthening;Left;2 sets;10 reps;Weights    Long Arc Quad Weight  3 lbs.    Long [employer] Limitations  emphasis on end range extension    Sit to [employer]  10 reps;with UE support   pause for terminal knee ext  emphasis on Lt     Vasopneumatic   Number Minutes Vasopneumatic   15 minutes    Vasopnuematic Location   Knee    Vasopneumatic Pressure  Medium    Vasopneumatic Temperature   three snowflakes               PT Short Term Goals - 04/19/19 1055      PT SHORT TERM GOAL #1   Title  Pt will be compliant and ind with initial HEP and edema management strategies    Status  Achieved      PT SHORT TERM GOAL #2   Title  Pt will wean to single point cane for household and short community distances.    Status  Achieved      PT SHORT TERM GOAL #3   Title  Pt will achieve Lt knee ROM 0-120 deg per surgeon's protocol.    Status  Partially Met      PT SHORT TERM GOAL #4   Title  Pt will be able to perform light household duties x 30 min without exacerbation of Lt knee pain.    Status  Achieved      PT SHORT TERM GOAL #5   Title  Pt will be ind with dressing Lt LE, with bathing (able to step over 14" tub ledge), and report greater ease with toilet transfers.    Status  Achieved        PT Long Term Goals - 04/01/19 1305      PT LONG TERM GOAL #1   Title  Pt will be ind in advanced HEP and understand how to safely progress.    Time  8    Period  Weeks    Status  New    Target Date  05/27/19      PT LONG TERM GOAL #2   Title  reduce FOTO to </= 33% to demo less limitation    Baseline  62%    Time  8    Period  Weeks    Status  New    Target Date  05/27/19      PT LONG TERM GOAL #3   Title  Pt will be able to perform community activities >/= 30 min without AD or with LRAD with symmetical gait pattern.    Time  8    Period  Weeks    Status  New    Target Date  05/27/19      PT LONG TERM GOAL #4   Title  Pt will achieve strength in Lt LE (hip, knee and ankle) of at least 4+/5 to improve functional tasks such as gait endurance, stairs, transfers.    Time  8    Period  Weeks    Status  New    Target Date  05/27/19      PT LONG TERM GOAL #5   Title  Pt will ambulate >/=  500' with 6 min walk test using LRAD or without AD.    Time  8    Period  Weeks    Status  New    Target Date  05/27/19            Plan - 04/22/19 1307    Clinical Impression Statement  Pt continues to lack end range Lt knee extension and terminal extension recruitment of quads.  Session focused on closed and open chain ther ex with emphasis on terminal extension range and strength.  Pt had signif compensation in Lt step up on 6" step so PT backed it down to 4" step with improved performance/form.  Pt benefits from tactile cueing of VMO and assisted end range motion during LAQs.  Pt continues to have Lt leg edema so PT and Pt discussed some return to use of compression stocking as she increases her household and community activity.  Pt will continue to benefit from skilled PT along current POC to address deficits and improve function.    Stability/Clinical Decision Making  Stable/Uncomplicated    Rehab Potential  Good    PT Frequency  3x / week   taper to 2x/week over next 1-2 weeks   PT Duration  8 weeks    PT Treatment/Interventions  ADLs/Self Care Home Management;Aquatic Therapy;Electrical Stimulation;Cryotherapy;DME Instruction;Gait training;Stair training;Functional mobility training;Therapeutic activities;Therapeutic exercise;Balance training;Neuromuscular re-education;Manual techniques;Patient/family education;Passive range of motion;Dry needling;Taping;Vasopneumatic Device;Joint Manipulations    PT Next Visit Plan  discuss readiness to taper to 2x/week in the next 1-2 weeks, adjust schedule, Lt knee edema management, NuStep, ROM, strength, gait training, vaso, 4" step ups, TKEs, SLS w/ vector toe taps    PT Home Exercise Plan  start medbridge to progress HEP from initial post-op protocol    Consulted and Agree with Plan of Care  Patient       Patient will benefit from skilled therapeutic intervention in order to improve the following deficits and impairments:     Visit  Diagnosis: Left knee pain, unspecified chronicity  Muscle weakness (generalized)  Stiffness of left knee, not elsewhere classified  Localized edema  Other abnormalities of gait and mobility     Problem List Patient Active Problem List   Diagnosis Date Noted  . Hypokalemia 06/27/2018  . Overweight (BMI 25.0-29.9) 06/27/2018  . S/P right TKA 06/26/2018  . Autoimmune disease ([place]) 03/15/2016  . High risk medication use 03/15/2016  . Pain in joint of right shoulder 03/15/2016  . Bilateral hip pain 03/15/2016  . Primary osteoarthritis of both knees 03/15/2016  . 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 Arthritis 02/24/2014  . HTN (hypertension) 02/24/2014  . Hyperlipidemia 02/24/2014  . Breast cancer, right, upper outer quadrant  07/05/2010    Lenzie Montesano,  PT 04/22/19 1:12 PM   [facility] [address], [place], [place], [address] Phone: [phone]   Fax:  [phone]  Name: KA BENCH MRN: [id number] Date of Birth: Oct 05, 1940

## 2019-04-24 ENCOUNTER — Ambulatory Visit: Payer: Medicare Other | Admitting: Physical Therapy

## 2019-04-24 ENCOUNTER — Other Ambulatory Visit: Payer: Self-pay

## 2019-04-24 ENCOUNTER — Encounter: Payer: Self-pay | Admitting: Physical Therapy

## 2019-04-24 DIAGNOSIS — M6281 Muscle weakness (generalized): Secondary | ICD-10-CM

## 2019-04-24 DIAGNOSIS — R2689 Other abnormalities of gait and mobility: Secondary | ICD-10-CM

## 2019-04-24 DIAGNOSIS — R600 Localized edema: Secondary | ICD-10-CM

## 2019-04-24 DIAGNOSIS — M25662 Stiffness of left knee, not elsewhere classified: Secondary | ICD-10-CM

## 2019-04-24 DIAGNOSIS — M25562 Pain in left knee: Secondary | ICD-10-CM | POA: Diagnosis not present

## 2019-04-24 NOTE — Therapy (Addendum)
[facility] [address] [place], [place], [address] Phone: [phone]   Fax:  [phone]  Physical Therapy Treatment  Patient Details  Name: Yolanda Peters MRN: [id number] Date of Birth: Apr 21, 1940 Referring Provider (PT): Dr. Rema Jasmine  Progress Note Reporting Period 04/01/19 to 04/24/19  See note below for Objective Data and Assessment of Progress/Goals.      Encounter Date: 04/24/2019  PT End of Session - 04/24/19 1059    Visit Number  10    Date for PT Re-Evaluation  05/27/19    Authorization Type  UHC Medicare    Progress Note Due on Visit  10    PT Start Time  1059    PT Stop Time  1200    PT Time Calculation (min)  61 min    Activity Tolerance  Patient tolerated treatment well    Behavior During Therapy  [facility] for tasks assessed/performed       Past Medical History:  Diagnosis Date  . Allergy   . Arthritis   . Breast cancer [facility]) 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  . Cancer ([place])    right breast   . Hyperlipidemia    takes Niacin  . Hypertension   . Personal history of radiation therapy 2011   rt breast    Past Surgical History:  Procedure Laterality Date  . BREAST DUCTAL SYSTEM EXCISION     right breast  . BREAST LUMPECTOMY    . BREAST SURGERY  august 2011   right partial mastectomy  . CATARACT EXTRACTION     bilateral  . CESAREAN SECTION    . ELBOW SURGERY     left  . JOINT REPLACEMENT    . KNEE ARTHROPLASTY    . MYOMECTOMY    . TOTAL KNEE ARTHROPLASTY Right 06/26/2018   Procedure: TOTAL KNEE ARTHROPLASTY;  Surgeon: Paralee Cancel, MD;  [facility]: [facility];  Service: Orthopedics;  Laterality: Right;  70 mins    There were no vitals filed for this visit.  Subjective Assessment - 04/24/19 1104    Subjective  Sleeping better, taking less medication, overall everything is going well.    Pertinent History  Rt TKR last year    Currently in Pain?  No/denies    Multiple Pain Sites  No          [facility] PT Assessment - 04/24/19 0001      Assessment   Referring Provider (PT)  Dr. Rema Jasmine    Onset Date/Surgical Date  03/28/19      Observation/Other Assessments   Focus on Therapeutic Outcomes (FOTO)   37% limitation      AROM   Left Knee Extension  0   Pt long sitting with RTLE on floor   Left Knee Flexion  126      Strength   Left Knee Flexion  4+/5    Left Knee Extension  4-/5   3/5 in TKE                  [facility] Adult PT Treatment/Exercise - 04/24/19 0001      Knee/Hip Exercises: Aerobic   Nustep  L3 arms/legs x 3', 29mn' legs only, SPM goal b/w 70-80+, PT present to discuss activity level and tolerance      Knee/Hip Exercises: Machines for Strengthening   Cybex Leg Press  Seat 8 65# x20      Knee/Hip Exercises: Standing   Lateral Step Up  Left;1 set;15  reps;Hand Hold: 2;Step Height: 6"    Forward Step Up  Left;1 set;15 reps;Hand Hold: 2;Step Height: 6"      Knee/Hip Exercises: Seated   Long Arc Quad  Strengthening;Left;3 sets;10 reps;Weights    Long Arc Quad Weight  3 lbs.    Long Arc Quad Limitations  VC for full knee extension      Vasopneumatic   Number Minutes Vasopneumatic   15 minutes    Vasopnuematic Location   Knee    Vasopneumatic Pressure  Medium    Vasopneumatic Temperature   three snowflakes               PT Short Term Goals - 04/24/19 1120      PT SHORT TERM GOAL #1   Title  Pt will be compliant and ind with initial HEP and edema management strategies    Time  2    Period  Weeks    Status  Achieved    Target Date  04/15/19      PT SHORT TERM GOAL #2   Title  Pt will wean to single point cane for household and short community distances.    Time  4    Period  Weeks    Status  Achieved    Target Date  04/22/19      PT SHORT TERM GOAL #4   Title  Pt will be able to perform light household duties x 30 min without exacerbation of Lt knee pain.    Time  4    Period  Weeks    Status  Achieved     Target Date  04/29/19      PT SHORT TERM GOAL #5   Title  Pt will be ind with dressing Lt LE, with bathing (able to step over 14" tub ledge), and report greater ease with toilet transfers.    Time  4    Period  Weeks    Target Date  04/29/19        PT Long Term Goals - 04/24/19 1121      PT LONG TERM GOAL #2   Title  reduce FOTO to </= 33% to demo less limitation    Time  8    Period  Weeks    Status  On-going   37% limitation     PT LONG TERM GOAL #3   Title  Pt will be able to perform community activities >/= 30 min without AD or with LRAD with symmetical gait pattern.    Time  8    Period  Weeks    Status  Achieved      PT LONG TERM GOAL #4   Title  Pt will achieve strength in Lt LE (hip, knee and ankle) of at least 4+/5 to improve functional tasks such as gait endurance, stairs, transfers.    Time  8    Period  Weeks    Status  Partially Met            Plan - 04/24/19 1105    Clinical Impression Statement  Pt reports sleeping has improved this week. Pt's FOTO score has significantly improved since eval. Pt's quad strength improving except terminal knee extension remains weak. Pt demonstrates today improved ability to ascend 6inch step without hip compensations. Pt's AROM goal is met, achieving 0-126 degrees today.    Personal Factors and Comorbidities  Time since onset of injury/illness/exacerbation    Examination-Activity Limitations  Transfers;Bend;Locomotion Level;Toileting;Stairs    Examination-Participation Restrictions  Driving;Community Activity;Cleaning;Meal Prep;Laundry    Stability/Clinical Decision Making  Stable/Uncomplicated    Rehab Potential  Good    PT Frequency  3x / week    PT Duration  8 weeks    PT Treatment/Interventions  ADLs/Self Care Home Management;Aquatic Therapy;Electrical Stimulation;Cryotherapy;DME Instruction;Gait training;Stair training;Functional mobility training;Therapeutic activities;Therapeutic exercise;Balance  training;Neuromuscular re-education;Manual techniques;Patient/family education;Passive range of motion;Dry needling;Taping;Vasopneumatic Device;Joint Manipulations    PT Next Visit Plan  10th visit PN, pt reports she does not want to wean to 2x a week just yet.    Consulted and Agree with Plan of Care  Patient       Patient will benefit from skilled therapeutic intervention in order to improve the following deficits and impairments:  Abnormal gait, Decreased range of motion, Difficulty walking, Decreased endurance, Pain, Decreased scar mobility, Impaired flexibility, Hypomobility, Decreased mobility, Decreased strength, Increased edema  Visit Diagnosis: Muscle weakness (generalized)  Stiffness of left knee, not elsewhere classified  Localized edema  Other abnormalities of gait and mobility     Problem List Patient Active Problem List   Diagnosis Date Noted  . Hypokalemia 06/27/2018  . Overweight (BMI 25.0-29.9) 06/27/2018  . S/P right TKA 06/26/2018  . Autoimmune disease ([place]) 03/15/2016  . High risk medication use 03/15/2016  . Pain in joint of right shoulder 03/15/2016  . Bilateral hip pain 03/15/2016  . Primary osteoarthritis of both knees 03/15/2016  . 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 Arthritis 02/24/2014  . HTN (hypertension) 02/24/2014  . Hyperlipidemia 02/24/2014  . Breast cancer, right, upper outer quadrant  07/05/2010   Myrene Galas, PTA 04/24/19 11:46 AM  Baruch Merl, PT 04/24/19 11:55 AM   [facility] [address], [place], [place], [address] Phone: [phone]   Fax:  [phone]  Name: Yolanda Peters MRN: [id number] Date of Birth: Jul 15, 1940

## 2019-04-25 ENCOUNTER — Encounter: Payer: Self-pay | Admitting: Physical Therapy

## 2019-04-25 ENCOUNTER — Other Ambulatory Visit: Payer: Self-pay

## 2019-04-25 ENCOUNTER — Ambulatory Visit: Payer: Medicare Other | Admitting: Physical Therapy

## 2019-04-25 DIAGNOSIS — R600 Localized edema: Secondary | ICD-10-CM

## 2019-04-25 DIAGNOSIS — M6281 Muscle weakness (generalized): Secondary | ICD-10-CM

## 2019-04-25 DIAGNOSIS — M25562 Pain in left knee: Secondary | ICD-10-CM

## 2019-04-25 DIAGNOSIS — M25662 Stiffness of left knee, not elsewhere classified: Secondary | ICD-10-CM

## 2019-04-25 DIAGNOSIS — R2689 Other abnormalities of gait and mobility: Secondary | ICD-10-CM

## 2019-04-25 NOTE — Therapy (Signed)
[facility] [address] [place], [place], [address] Phone: [phone]   Fax:  [phone]  Physical Therapy Treatment  Patient Details  Name: Yolanda Peters MRN: [id number] Date of Birth: October 13, 1940 Referring Provider (PT): Dr. Rema Jasmine   Encounter Date: 04/25/2019  PT End of Session - 04/25/19 1431    Visit Number  11    Date for PT Re-Evaluation  05/27/19    Authorization Type  UHC Medicare    Progress Note Due on Visit  20    PT Start Time  1400    PT Stop Time  1500    PT Time Calculation (min)  60 min    Activity Tolerance  Patient tolerated treatment well    Behavior During Therapy  [facility] for tasks assessed/performed       Past Medical History:  Diagnosis Date  . Allergy   . Arthritis   . Breast cancer [facility]) 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  . Cancer ([place])    right breast   . Hyperlipidemia    takes Niacin  . Hypertension   . Personal history of radiation therapy 2011   rt breast    Past Surgical History:  Procedure Laterality Date  . BREAST DUCTAL SYSTEM EXCISION     right breast  . BREAST LUMPECTOMY    . BREAST SURGERY  august 2011   right partial mastectomy  . CATARACT EXTRACTION     bilateral  . CESAREAN SECTION    . ELBOW SURGERY     left  . JOINT REPLACEMENT    . KNEE ARTHROPLASTY    . MYOMECTOMY    . TOTAL KNEE ARTHROPLASTY Right 06/26/2018   Procedure: TOTAL KNEE ARTHROPLASTY;  Surgeon: Paralee Cancel, MD;  Location: [facility];  Service: Orthopedics;  Laterality: Right;  70 mins    There were no vitals filed for this visit.  Subjective Assessment - 04/25/19 1404    Subjective  I need a good rub down today.  I walked x 45 min last night and then had a terrible night for pain.  Pain in inside of knee.  I tried some heat and it felt soothing.    Pertinent History  Rt TKR last year    Limitations  House hold activities;Standing;Walking;Other (comment)    How long can you stand  comfortably?  20+    How long can you walk comfortably?  77' but pain later    Patient Stated Goals  go down 5 back steps with 2 rails to access backyard, be able to drive by early April    Currently in Pain?  Yes    Pain Score  3     Pain Location  Knee    Pain Orientation  Left;Medial    Pain Descriptors / Indicators  Aching    Pain Type  Surgical pain    Pain Onset  More than a month ago    Pain Frequency  Intermittent    Aggravating Factors   walking/standing    Pain Relieving Factors  meds, ice, stretching, maybe heat    Effect of Pain on Daily Activities  dressing (socks and pants)                       [facility] Adult PT Treatment/Exercise - 04/25/19 0001      Knee/Hip Exercises: Aerobic   Nustep  L3 legs only x 8'   PT present to monitor and discuss  progress     Knee/Hip Exercises: Supine   Short Arc Quad Sets  Left;20 reps    Short Arc Quad Sets Limitations  hold x 3 sec, lower eccentric on 3 count to show control    Bridges  Strengthening;2 sets;10 reps    Straight Leg Raises  Left;3 sets;5 reps    Straight Leg Raises Limitations  improved knee extension when cued to engage quad before SLR reps      Vasopneumatic   Number Minutes Vasopneumatic   15 minutes    Vasopnuematic Location   Knee    Vasopneumatic Pressure  Medium    Vasopneumatic Temperature   three snowflakes      Manual Therapy   Manual Therapy  Joint mobilization;Soft tissue mobilization    Joint Mobilization  Lt patellar mobs med/lat/sup/inf    Soft tissue mobilization  scar mobs over patellar region of scar, retrograde massage, medial and lateral joint lines Lt knee               PT Short Term Goals - 04/24/19 1120      PT SHORT TERM GOAL #1   Title  Pt will be compliant and ind with initial HEP and edema management strategies    Time  2    Period  Weeks    Status  Achieved    Target Date  04/15/19      PT SHORT TERM GOAL #2   Title  Pt will wean to single point cane for  household and short community distances.    Time  4    Period  Weeks    Status  Achieved    Target Date  04/22/19      PT SHORT TERM GOAL #4   Title  Pt will be able to perform light household duties x 30 min without exacerbation of Lt knee pain.    Time  4    Period  Weeks    Status  Achieved    Target Date  04/29/19      PT SHORT TERM GOAL #5   Title  Pt will be ind with dressing Lt LE, with bathing (able to step over 14" tub ledge), and report greater ease with toilet transfers.    Time  4    Period  Weeks    Target Date  04/29/19        PT Long Term Goals - 04/24/19 1121      PT LONG TERM GOAL #2   Title  reduce FOTO to </= 33% to demo less limitation    Time  8    Period  Weeks    Status  On-going   37% limitation     PT LONG TERM GOAL #3   Title  Pt will be able to perform community activities >/= 30 min without AD or with LRAD with symmetical gait pattern.    Time  8    Period  Weeks    Status  Achieved      PT LONG TERM GOAL #4   Title  Pt will achieve strength in Lt LE (hip, knee and ankle) of at least 4+/5 to improve functional tasks such as gait endurance, stairs, transfers.    Time  8    Period  Weeks    Status  Partially Met            Plan - 04/25/19 1434    Clinical Impression Statement  Pt had a bad night with pain  after taking her first exercise walk x 45' yesterday afternoon.  PT performed manual techniques to improve patellar mobs, scar mobs along middle of scar overlying patella and medial/lateral joint lines.  PT discussed the need to more gradually introduce walking for exercise with a possible starting time of 15' and add time from there if well tolerated.  PT reiterated focusing on gait quality vs time/distance to avoid compensations and undue strain on knee related to compensations.  PT also encouraged Pt to take cane along for walks and note when she starts to put more weight into it as this may suggest more LE fatigue.  Edema in lower leg  is still present but much improved from previous weeks.  PT encouraged Pt to work more on middle of scar overlying patellar region as this was tacked down but improved with scar mobs by PT today.  PT noted improved quad activation and terminal knee extension control today during SAQ and SLRs today.  Pt will continue to benefit from skilled PT along POC.    Rehab Potential  Good    PT Frequency  3x / week    PT Duration  8 weeks    PT Treatment/Interventions  ADLs/Self Care Home Management;Aquatic Therapy;Electrical Stimulation;Cryotherapy;DME Instruction;Gait training;Stair training;Functional mobility training;Therapeutic activities;Therapeutic exercise;Balance training;Neuromuscular re-education;Manual techniques;Patient/family education;Passive range of motion;Dry needling;Taping;Vasopneumatic Device;Joint Manipulations    PT Next Visit Plan  f/u on shorter exercise walks tolerance, scar mobs middle of scar, patellar mobility, continue to work end range knee control and strength, leg press, step ups    Consulted and Agree with Plan of Care  Patient       Patient will benefit from skilled therapeutic intervention in order to improve the following deficits and impairments:     Visit Diagnosis: Muscle weakness (generalized)  Stiffness of left knee, not elsewhere classified  Localized edema  Other abnormalities of gait and mobility  Left knee pain, unspecified chronicity     Problem List Patient Active Problem List   Diagnosis Date Noted  . Hypokalemia 06/27/2018  . Overweight (BMI 25.0-29.9) 06/27/2018  . S/P right TKA 06/26/2018  . Autoimmune disease ([place]) 03/15/2016  . High risk medication use 03/15/2016  . Pain in joint of right shoulder 03/15/2016  . Bilateral hip pain 03/15/2016  . Primary osteoarthritis of both knees 03/15/2016  . 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 Arthritis 02/24/2014  . HTN (hypertension) 02/24/2014  . Hyperlipidemia 02/24/2014  . Breast  cancer, right, upper outer quadrant  07/05/2010    Baruch Merl, PT 04/25/19 2:41 PM   [facility] [address], [place], [place], [address] Phone: [phone]   Fax:  [phone]  Name: GABRYEL TALAMO MRN: [id number] Date of Birth: [birth year]

## 2019-04-26 ENCOUNTER — Encounter: Payer: Medicare Other | Admitting: Physical Therapy

## 2019-04-29 ENCOUNTER — Other Ambulatory Visit: Payer: Self-pay

## 2019-04-29 ENCOUNTER — Encounter: Payer: Self-pay | Admitting: Physical Therapy

## 2019-04-29 ENCOUNTER — Ambulatory Visit: Payer: Medicare Other | Admitting: Physical Therapy

## 2019-04-29 DIAGNOSIS — R600 Localized edema: Secondary | ICD-10-CM

## 2019-04-29 DIAGNOSIS — R2689 Other abnormalities of gait and mobility: Secondary | ICD-10-CM

## 2019-04-29 DIAGNOSIS — M25562 Pain in left knee: Secondary | ICD-10-CM

## 2019-04-29 DIAGNOSIS — M6281 Muscle weakness (generalized): Secondary | ICD-10-CM

## 2019-04-29 DIAGNOSIS — M25662 Stiffness of left knee, not elsewhere classified: Secondary | ICD-10-CM

## 2019-04-29 NOTE — Therapy (Signed)
[facility] [address] [place], [place], [address] Phone: [phone]   Fax:  [phone]  Physical Therapy Treatment  Patient Details  Name: Yolanda Peters MRN: [id number] Date of Birth: 1940-06-30 Referring Provider (PT): Dr. Rema Jasmine   Encounter Date: 04/29/2019  PT End of Session - 04/29/19 1109    Visit Number  12    Date for PT Re-Evaluation  05/27/19    Authorization Type  UHC Medicare    PT Start Time  1106    PT Stop Time  1200    PT Time Calculation (min)  54 min    Activity Tolerance  Patient tolerated treatment well    Behavior During Therapy  [facility] for tasks assessed/performed       Past Medical History:  Diagnosis Date  . Allergy   . Arthritis   . Breast cancer [facility]) 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  . Cancer ([place])    right breast   . Hyperlipidemia    takes Niacin  . Hypertension   . Personal history of radiation therapy 2011   rt breast    Past Surgical History:  Procedure Laterality Date  . BREAST DUCTAL SYSTEM EXCISION     right breast  . BREAST LUMPECTOMY    . BREAST SURGERY  august 2011   right partial mastectomy  . CATARACT EXTRACTION     bilateral  . CESAREAN SECTION    . ELBOW SURGERY     left  . JOINT REPLACEMENT    . KNEE ARTHROPLASTY    . MYOMECTOMY    . TOTAL KNEE ARTHROPLASTY Right 06/26/2018   Procedure: TOTAL KNEE ARTHROPLASTY;  Surgeon: Paralee Cancel, MD;  [facility];  Service: Orthopedics;  Laterality: Right;  70 mins    There were no vitals filed for this visit.  Subjective Assessment - 04/29/19 1110    Subjective  I walked less but it didn't change anything, I still hurt at night.    Pertinent History  Rt TKR last year    Currently in Pain?  Yes    Pain Score  3     Pain Location  Knee    Pain Orientation  Left;Medial    Pain Descriptors / Indicators  Sore    Multiple Pain Sites  No                       [facility] Adult PT  Treatment/Exercise - 04/29/19 0001      Knee/Hip Exercises: Aerobic   Nustep  L3 x 8 min LE with discussion of current status.      Knee/Hip Exercises: Machines for Strengthening   Cybex Leg Press  Seat 8 65# 2x15      Knee/Hip Exercises: Seated   Long Arc Quad  Strengthening;Left;2 sets;15 reps;Weights    Long Arc Quad Weight  3 lbs.      Vasopneumatic   Number Minutes Vasopneumatic   15 minutes    Vasopnuematic Location   Knee    Vasopneumatic Pressure  Medium    Vasopneumatic Temperature   three snowflakes      Manual Therapy   Soft tissue mobilization  scar mobs over patellar region of scar, retrograde massage, medial and lateral joint lines Lt knee               PT Short Term Goals - 04/24/19 1120      PT SHORT TERM GOAL #1   Title  Pt will be compliant and ind with initial HEP and edema management strategies    Time  2    Period  Weeks    Status  Achieved    Target Date  04/15/19      PT SHORT TERM GOAL #2   Title  Pt will wean to single point cane for household and short community distances.    Time  4    Period  Weeks    Status  Achieved    Target Date  04/22/19      PT SHORT TERM GOAL #4   Title  Pt will be able to perform light household duties x 30 min without exacerbation of Lt knee pain.    Time  4    Period  Weeks    Status  Achieved    Target Date  04/29/19      PT SHORT TERM GOAL #5   Title  Pt will be ind with dressing Lt LE, with bathing (able to step over 14" tub ledge), and report greater ease with toilet transfers.    Time  4    Period  Weeks    Target Date  04/29/19        PT Long Term Goals - 04/24/19 1121      PT LONG TERM GOAL #2   Title  reduce FOTO to </= 33% to demo less limitation    Time  8    Period  Weeks    Status  On-going   37% limitation     PT LONG TERM GOAL #3   Title  Pt will be able to perform community activities >/= 30 min without AD or with LRAD with symmetical gait pattern.    Time  8    Period   Weeks    Status  Achieved      PT LONG TERM GOAL #4   Title  Pt will achieve strength in Lt LE (hip, knee and ankle) of at least 4+/5 to improve functional tasks such as gait endurance, stairs, transfers.    Time  8    Period  Weeks    Status  Partially Met            Plan - 04/29/19 1109    Clinical Impression Statement  Pt presents today with mild knee pain. Some nights sleeping is much better, some nights not so much. Edema in LTLE significantly improving as is the quality of her quad contraction.    Personal Factors and Comorbidities  Time since onset of injury/illness/exacerbation    Examination-Activity Limitations  Transfers;Bend;Locomotion Level;Toileting;Stairs    Examination-Participation Restrictions  Driving;Community Activity;Cleaning;Meal Prep;Laundry    Stability/Clinical Decision Making  Stable/Uncomplicated    Rehab Potential  Good    PT Frequency  3x / week    PT Duration  8 weeks    PT Treatment/Interventions  ADLs/Self Care Home Management;Aquatic Therapy;Electrical Stimulation;Cryotherapy;DME Instruction;Gait training;Stair training;Functional mobility training;Therapeutic activities;Therapeutic exercise;Balance training;Neuromuscular re-education;Manual techniques;Patient/family education;Passive range of motion;Dry needling;Taping;Vasopneumatic Device;Joint Manipulations    PT Next Visit Plan  quad strength particularly in TKE.    Consulted and Agree with Plan of Care  Patient       Patient will benefit from skilled therapeutic intervention in order to improve the following deficits and impairments:  Abnormal gait, Decreased range of motion, Difficulty walking, Decreased endurance, Pain, Decreased scar mobility, Impaired flexibility, Hypomobility, Decreased mobility, Decreased strength, Increased edema  Visit Diagnosis: Muscle weakness (generalized)  Stiffness of left knee, not  elsewhere classified  Localized edema  Other abnormalities of gait and  mobility  Left knee pain, unspecified chronicity     Problem List Patient Active Problem List   Diagnosis Date Noted  . Hypokalemia 06/27/2018  . Overweight (BMI 25.0-29.9) 06/27/2018  . S/P right TKA 06/26/2018  . Autoimmune disease ([place]) 03/15/2016  . High risk medication use 03/15/2016  . Pain in joint of right shoulder 03/15/2016  . Bilateral hip pain 03/15/2016  . Primary osteoarthritis of both knees 03/15/2016  . 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 Arthritis 02/24/2014  . HTN (hypertension) 02/24/2014  . Hyperlipidemia 02/24/2014  . Breast cancer, right, upper outer quadrant  07/05/2010    Cereniti Curb, PTA 04/29/2019, 11:44 AM  [facility] [address], [place], [place], [address] Phone: [phone]   Fax:  [phone]  Name: PATTYE MEDA MRN: [id number] Date of Birth: [birth year]

## 2019-05-01 ENCOUNTER — Ambulatory Visit: Payer: Medicare Other | Admitting: Physical Therapy

## 2019-05-01 ENCOUNTER — Encounter: Payer: Self-pay | Admitting: Physical Therapy

## 2019-05-01 ENCOUNTER — Other Ambulatory Visit: Payer: Self-pay

## 2019-05-01 DIAGNOSIS — R2689 Other abnormalities of gait and mobility: Secondary | ICD-10-CM

## 2019-05-01 DIAGNOSIS — M25662 Stiffness of left knee, not elsewhere classified: Secondary | ICD-10-CM

## 2019-05-01 DIAGNOSIS — M6281 Muscle weakness (generalized): Secondary | ICD-10-CM

## 2019-05-01 DIAGNOSIS — R600 Localized edema: Secondary | ICD-10-CM

## 2019-05-01 DIAGNOSIS — M25562 Pain in left knee: Secondary | ICD-10-CM

## 2019-05-01 NOTE — Therapy (Signed)
[facility] [address] [place], [place], [address] Phone: [phone]   Fax:  [phone]  Physical Therapy Treatment  Patient Details  Name: Yolanda Peters MRN: [id number] Date of Birth: 08/23/1940 Referring Provider (PT): Dr. Charles E Craven   Encounter Date: 05/01/2019  PT End of Session - 05/01/19 1447    Visit Number  13    Date for PT Re-Evaluation  05/27/19    Authorization Type  UHC Medicare    PT Start Time  1444    PT Stop Time  1545    PT Time Calculation (min)  61 min    Equipment Utilized During Treatment  Other (comment)    Activity Tolerance  Patient tolerated treatment well    Behavior During Therapy  [facility] for tasks assessed/performed       Past Medical History:  Diagnosis Date  . Allergy   . Arthritis   . 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  . Cancer ([facility])    right breast   . Hyperlipidemia    takes Niacin  . Hypertension   . Personal history of radiation therapy 2011   rt breast    Past Surgical History:  Procedure Laterality Date  . BREAST DUCTAL SYSTEM EXCISION     right breast  . BREAST LUMPECTOMY    . BREAST SURGERY  august 2011   right partial mastectomy  . CATARACT EXTRACTION     bilateral  . CESAREAN SECTION    . ELBOW SURGERY     left  . JOINT REPLACEMENT    . KNEE ARTHROPLASTY    . MYOMECTOMY    . TOTAL KNEE ARTHROPLASTY Right 06/26/2018   Procedure: TOTAL KNEE ARTHROPLASTY;  Surgeon: Olin, Matthew, MD;  Location: [facility];  Service: Orthopedics;  Laterality: Right;  70 mins    There were no vitals filed for this visit.  Subjective Assessment - 05/01/19 1450    Subjective  I am stiff from sitting in the MD office with my husband this AM.    Pertinent History  Rt TKR last year    Currently in Pain?  --   No pain, just stiff   Multiple Pain Sites  No                       [facility] Adult PT Treatment/Exercise - 05/01/19 0001      Knee/Hip  Exercises: Aerobic   Nustep  L3 x 10 min LE with discussion of current status.      Knee/Hip Exercises: Machines for Strengthening   Cybex Leg Press  Seat 8 65# 2x20, LTLE 30# 2x10      Knee/Hip Exercises: Standing   Hip Abduction  AROM;Stengthening;Both;2 sets;20 reps;Knee straight    Forward Step Up  Left;2 sets;10 reps;Hand Hold: 2;Step Height: 6"   very light UE   SLS  On mini tramp Lt with RT toe touch support 10 sec 3 x, weight shifting  in between sets    Other Standing Knee Exercises  Rockerboard for Ankle ROM: TC to keep pt from excessive trunk motion : F/B heel raises 10x       Knee/Hip Exercises: Seated   Long Arc Quad  Strengthening;Left;3 sets;10 reps;Weights    Long Arc Quad Weight  3 lbs.      Vasopneumatic   Number Minutes Vasopneumatic   10 minutes    Vasopnuematic Location   Knee    Vasopneumatic Pressure    Medium    Vasopneumatic Temperature   three snowflakes      Manual Therapy   Soft tissue mobilization  Scar mobs               PT Short Term Goals - 04/24/19 1120      PT SHORT TERM GOAL #1   Title  Pt will be compliant and ind with initial HEP and edema management strategies    Time  2    Period  Weeks    Status  Achieved    Target Date  04/15/19      PT SHORT TERM GOAL #2   Title  Pt will wean to single point cane for household and short community distances.    Time  4    Period  Weeks    Status  Achieved    Target Date  04/22/19      PT SHORT TERM GOAL #4   Title  Pt will be able to perform light household duties x 30 min without exacerbation of Lt knee pain.    Time  4    Period  Weeks    Status  Achieved    Target Date  04/29/19      PT SHORT TERM GOAL #5   Title  Pt will be ind with dressing Lt LE, with bathing (able to step over 14" tub ledge), and report greater ease with toilet transfers.    Time  4    Period  Weeks    Target Date  04/29/19        PT Long Term Goals - 04/24/19 1121      PT LONG TERM GOAL #2   Title   reduce FOTO to </= 33% to demo less limitation    Time  8    Period  Weeks    Status  On-going   37% limitation     PT LONG TERM GOAL #3   Title  Pt will be able to perform community activities >/= 30 min without AD or with LRAD with symmetical gait pattern.    Time  8    Period  Weeks    Status  Achieved      PT LONG TERM GOAL #4   Title  Pt will achieve strength in Lt LE (hip, knee and ankle) of at least 4+/5 to improve functional tasks such as gait endurance, stairs, transfers.    Time  8    Period  Weeks    Status  Partially Met            Plan - 05/01/19 1448    Clinical Impression Statement  Pt arrives with no pain but reports of stiffness. Pt demonstrates improved quad strength especially in terminal extension. Some nights sleep is ok, some nights cannot get comfortable. Medication can be helpful.    Personal Factors and Comorbidities  Time since onset of injury/illness/exacerbation    Examination-Activity Limitations  Transfers;Bend;Locomotion Level;Toileting;Stairs    Examination-Participation Restrictions  Driving;Community Activity;Cleaning;Meal Prep;Laundry    Stability/Clinical Decision Making  Stable/Uncomplicated    Rehab Potential  Good    PT Frequency  3x / week    PT Duration  8 weeks    PT Treatment/Interventions  ADLs/Self Care Home Management;Aquatic Therapy;Electrical Stimulation;Cryotherapy;DME Instruction;Gait training;Stair training;Functional mobility training;Therapeutic activities;Therapeutic exercise;Balance training;Neuromuscular re-education;Manual techniques;Patient/family education;Passive range of motion;Dry needling;Taping;Vasopneumatic Device;Joint Manipulations    PT Next Visit Plan  quad strength particularly in TKE. 6 min walk test per LTG.  Consulted and Agree with Plan of Care  Patient       Patient will benefit from skilled therapeutic intervention in order to improve the following deficits and impairments:  Abnormal gait, Decreased  range of motion, Difficulty walking, Decreased endurance, Pain, Decreased scar mobility, Impaired flexibility, Hypomobility, Decreased mobility, Decreased strength, Increased edema  Visit Diagnosis: Muscle weakness (generalized)  Stiffness of left knee, not elsewhere classified  Localized edema  Other abnormalities of gait and mobility  Left knee pain, unspecified chronicity     Problem List Patient Active Problem List   Diagnosis Date Noted  . Hypokalemia 06/27/2018  . Overweight (BMI 25.0-29.9) 06/27/2018  . S/P right TKA 06/26/2018  . Autoimmune disease ([place]) 03/15/2016  . High risk medication use 03/15/2016  . Pain in joint of right shoulder 03/15/2016  . Bilateral hip pain 03/15/2016  . Primary osteoarthritis of both knees 03/15/2016  . 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 Arthritis 02/24/2014  . HTN (hypertension) 02/24/2014  . Hyperlipidemia 02/24/2014  . Breast cancer, right, upper outer quadrant  07/05/2010    Axell Trigueros, PTA 05/01/2019, 3:26 PM  [facility] [address] [place], [place], [address] Phone: [phone]   Fax:  [phone]  Name: Yolanda Peters MRN: [id number] Date of Birth: 01-31-1940

## 2019-05-03 ENCOUNTER — Encounter: Payer: Self-pay | Admitting: Physical Therapy

## 2019-05-03 ENCOUNTER — Other Ambulatory Visit: Payer: Self-pay

## 2019-05-03 ENCOUNTER — Ambulatory Visit: Payer: Medicare Other | Admitting: Physical Therapy

## 2019-05-03 DIAGNOSIS — M25562 Pain in left knee: Secondary | ICD-10-CM

## 2019-05-03 DIAGNOSIS — M6281 Muscle weakness (generalized): Secondary | ICD-10-CM

## 2019-05-03 DIAGNOSIS — M25662 Stiffness of left knee, not elsewhere classified: Secondary | ICD-10-CM

## 2019-05-03 DIAGNOSIS — R600 Localized edema: Secondary | ICD-10-CM

## 2019-05-03 DIAGNOSIS — R2689 Other abnormalities of gait and mobility: Secondary | ICD-10-CM

## 2019-05-03 NOTE — Therapy (Signed)
[facility] [address], [place], [place], [address] Phone: [phone]   Fax:  [phone]  Physical Therapy Treatment  Patient Details  Name: Yolanda Peters MRN: [id number] Date of Birth: 08-Jul-1940 Referring Provider (PT): Dr. Rema Jasmine   Encounter Date: 05/03/2019  PT End of Session - 05/03/19 1038    Visit Number  14    Date for PT Re-Evaluation  05/27/19    Authorization Type  UHC Medicare    Progress Note Due on Visit  20    PT Start Time  1020    PT Stop Time  1105    PT Time Calculation (min)  45 min    Activity Tolerance  Patient tolerated treatment well    Behavior During Therapy  [facility] for tasks assessed/performed       Past Medical History:  Diagnosis Date  . Allergy   . Arthritis   . Breast cancer [facility]) 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  . Cancer ([place])    right breast   . Hyperlipidemia    takes Niacin  . Hypertension   . Personal history of radiation therapy 2011   rt breast    Past Surgical History:  Procedure Laterality Date  . BREAST DUCTAL SYSTEM EXCISION     right breast  . BREAST LUMPECTOMY    . BREAST SURGERY  august 2011   right partial mastectomy  . CATARACT EXTRACTION     bilateral  . CESAREAN SECTION    . ELBOW SURGERY     left  . JOINT REPLACEMENT    . KNEE ARTHROPLASTY    . MYOMECTOMY    . TOTAL KNEE ARTHROPLASTY Right 06/26/2018   Procedure: TOTAL KNEE ARTHROPLASTY;  Surgeon: Paralee Cancel, MD;  Location: [facility];  Service: Orthopedics;  Laterality: Right;  70 mins    There were no vitals filed for this visit.  Subjective Assessment - 05/03/19 1025    Subjective  Night pain is getting much better.  Just still this morning.    Pertinent History  Rt TKR last year    Limitations  House hold activities;Standing;Walking;Other (comment)    How long can you stand comfortably?  20+    How long can you walk comfortably?  76' but pain later    Patient Stated Goals  go down 5  back steps with 2 rails to access backyard, be able to drive by early April    Currently in Pain?  No/denies    Pain Location  Knee    Pain Orientation  Left    Pain Descriptors / Indicators  Tightness    Pain Type  Surgical pain    Pain Onset  More than a month ago    Pain Frequency  Intermittent    Effect of Pain on Daily Activities  much improved dressing of socks and pants                       [facility] Adult PT Treatment/Exercise - 05/03/19 0001      Knee/Hip Exercises: Stretches   Gastroc Stretch  Left;30 seconds    Gastroc Stretch Limitations  slant board, oscillating stretch      Knee/Hip Exercises: Aerobic   Recumbent Bike  no resistance x 6' beginning of session, PT present to monitor first time on bike, able to do full revolutions without pain      Knee/Hip Exercises: Machines for Strengthening   Cybex [employer]  Seat 8 65# 2x20, LTLE 30# 2x10      Knee/Hip Exercises: Standing   Hip Abduction  Stengthening;Left;2 sets;10 reps;Knee straight    Abduction Limitations  1.5lb    Hip Extension  Stengthening;Left;Knee straight;2 sets;10 reps    Extension Limitations  1.5lb    SLS  Lt SLS 3x10 holding edge of TM      Knee/Hip Exercises: Supine   Bridges  Strengthening;Both;15 reps    Straight Leg Raises  Strengthening;3 sets;10 reps    Straight Leg Raises Limitations  1lb, exhale on effort cue      Vasopneumatic   Number Minutes Vasopneumatic   10 minutes    Vasopnuematic Location   Knee    Vasopneumatic Pressure  Medium    Vasopneumatic Temperature   three snowflakes               PT Short Term Goals - 04/24/19 1120      PT SHORT TERM GOAL #1   Title  Pt will be compliant and ind with initial HEP and edema management strategies    Time  2    Period  Weeks    Status  Achieved    Target Date  04/15/19      PT SHORT TERM GOAL #2   Title  Pt will wean to single point cane for household and short community distances.    Time  4    Period   Weeks    Status  Achieved    Target Date  04/22/19      PT SHORT TERM GOAL #4   Title  Pt will be able to perform light household duties x 30 min without exacerbation of Lt knee pain.    Time  4    Period  Weeks    Status  Achieved    Target Date  04/29/19      PT SHORT TERM GOAL #5   Title  Pt will be ind with dressing Lt LE, with bathing (able to step over 14" tub ledge), and report greater ease with toilet transfers.    Time  4    Period  Weeks    Target Date  04/29/19        PT Long Term Goals - 04/24/19 1121      PT LONG TERM GOAL #2   Title  reduce FOTO to </= 33% to demo less limitation    Time  8    Period  Weeks    Status  On-going   37% limitation     PT LONG TERM GOAL #3   Title  Pt will be able to perform community activities >/= 30 min without AD or with LRAD with symmetical gait pattern.    Time  8    Period  Weeks    Status  Achieved      PT LONG TERM GOAL #4   Title  Pt will achieve strength in Lt LE (hip, knee and ankle) of at least 4+/5 to improve functional tasks such as gait endurance, stairs, transfers.    Time  8    Period  Weeks    Status  Partially Met            Plan - 05/03/19 1039    Clinical Impression Statement  PT advanced some standing and supine strength with ankle weights today.  Pt is unable to perform standing hip flexion with knee straight without using momentum so PT had Pt work on ankle weighted  SLRs supine today.  Pt continues to have gait asymmetry due to end range knee ext limitation and hip/knee strength deficit but this is improving.  Pt was able to perform full revolution on recumbant bike today so warm up on bike vs NuStep today.  Pt will continue to benefit from skilled PT along current POC.    Rehab Potential  Good    PT Frequency  3x / week    PT Duration  8 weeks    PT Treatment/Interventions  ADLs/Self Care Home Management;Aquatic Therapy;Electrical Stimulation;Cryotherapy;DME Instruction;Gait training;Stair  training;Functional mobility training;Therapeutic activities;Therapeutic exercise;Balance training;Neuromuscular re-education;Manual techniques;Patient/family education;Passive range of motion;Dry needling;Taping;Vasopneumatic Device;Joint Manipulations    PT Next Visit Plan  6 min walk test, recumbant bike, SLR supine with ankle weight (PT asked Pt to practice 3x10 or 3x15 at home over weekend)    Consulted and Agree with Plan of Care  Patient       Patient will benefit from skilled therapeutic intervention in order to improve the following deficits and impairments:     Visit Diagnosis: Muscle weakness (generalized)  Stiffness of left knee, not elsewhere classified  Localized edema  Other abnormalities of gait and mobility  Left knee pain, unspecified chronicity     Problem List Patient Active Problem List   Diagnosis Date Noted  . Hypokalemia 06/27/2018  . Overweight (BMI 25.0-29.9) 06/27/2018  . S/P right TKA 06/26/2018  . Autoimmune disease ([place]) 03/15/2016  . High risk medication use 03/15/2016  . Pain in joint of right shoulder 03/15/2016  . Bilateral hip pain 03/15/2016  . Primary osteoarthritis of both knees 03/15/2016  . 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 Arthritis 02/24/2014  . HTN (hypertension) 02/24/2014  . Hyperlipidemia 02/24/2014  . Breast cancer, right, upper outer quadrant  07/05/2010    Baruch Merl, PT 05/03/19 10:54 AM   [facility] [address] [place], [place], [address] Phone: [phone]   Fax:  [phone]  Name: Yolanda Peters MRN: [id number] Date of Birth: [birth year]

## 2019-05-06 ENCOUNTER — Ambulatory Visit: Payer: Medicare Other | Admitting: Physical Therapy

## 2019-05-06 ENCOUNTER — Other Ambulatory Visit: Payer: Self-pay

## 2019-05-06 ENCOUNTER — Encounter: Payer: Self-pay | Admitting: Physical Therapy

## 2019-05-06 DIAGNOSIS — R2689 Other abnormalities of gait and mobility: Secondary | ICD-10-CM

## 2019-05-06 DIAGNOSIS — R600 Localized edema: Secondary | ICD-10-CM

## 2019-05-06 DIAGNOSIS — M6281 Muscle weakness (generalized): Secondary | ICD-10-CM

## 2019-05-06 DIAGNOSIS — M25562 Pain in left knee: Secondary | ICD-10-CM

## 2019-05-06 DIAGNOSIS — M25662 Stiffness of left knee, not elsewhere classified: Secondary | ICD-10-CM

## 2019-05-06 NOTE — Therapy (Signed)
[facility] [address], [place], [place], [address] Phone: [phone]   Fax:  [phone]  Physical Therapy Treatment  Patient Details  Name: Yolanda Peters MRN: [id number] Date of Birth: 1940/02/23 Referring Provider (PT): Dr. Rema Jasmine   Encounter Date: 05/06/2019  PT End of Session - 05/06/19 1920    Visit Number  15    Date for PT Re-Evaluation  05/27/19    Authorization Type  UHC Medicare    Progress Note Due on Visit  20    PT Start Time  1015    PT Stop Time  1115    PT Time Calculation (min)  60 min    Activity Tolerance  Patient tolerated treatment well    Behavior During Therapy  [facility] for tasks assessed/performed       Past Medical History:  Diagnosis Date  . Allergy   . Arthritis   . Breast cancer [facility]) 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  . Cancer ([place])    right breast   . Hyperlipidemia    takes Niacin  . Hypertension   . Personal history of radiation therapy 2011   rt breast    Past Surgical History:  Procedure Laterality Date  . BREAST DUCTAL SYSTEM EXCISION     right breast  . BREAST LUMPECTOMY    . BREAST SURGERY  august 2011   right partial mastectomy  . CATARACT EXTRACTION     bilateral  . CESAREAN SECTION    . ELBOW SURGERY     left  . JOINT REPLACEMENT    . KNEE ARTHROPLASTY    . MYOMECTOMY    . TOTAL KNEE ARTHROPLASTY Right 06/26/2018   Procedure: TOTAL KNEE ARTHROPLASTY;  Surgeon: Paralee Cancel, MD;  [facility]: [facility];  Service: Orthopedics;  Laterality: Right;  70 mins    There were no vitals filed for this visit.  Subjective Assessment - 05/06/19 1919    Subjective  No new complaints    Pertinent History  Rt TKR last year    Currently in Pain?  No/denies         [facility] PT Assessment - 05/06/19 0001      6 minute walk test results    Aerobic Endurance Distance JQBHAL  937    Endurance additional comments  Combination indoor & outdoor                    [facility] Adult PT Treatment/Exercise - 05/06/19 0001      Knee/Hip Exercises: Aerobic   Recumbent Bike  L1 x 6 min with RPMS > 30      Knee/Hip Exercises: Standing   Hip Abduction  Stengthening;Both;2 sets;15 reps;Knee straight    Abduction Limitations  2#    Walking with Sports Cord  6x each direction 20# light CGA for balance      Knee/Hip Exercises: Seated   Long Arc Quad  Strengthening;Left;3 sets;10 reps;Weights    Long Arc Quad Weight  5 lbs.      Vasopneumatic   Number Minutes Vasopneumatic   15 minutes    Vasopnuematic Location   Knee    Vasopneumatic Pressure  Medium    Vasopneumatic Temperature   three snowflakes      Manual Therapy   Edema Management  5 min LTLE retrograde massage               PT Short Term Goals - 04/24/19 1120  PT SHORT TERM GOAL #1   Title  Pt will be compliant and ind with initial HEP and edema management strategies    Time  2    Period  Weeks    Status  Achieved    Target Date  04/15/19      PT SHORT TERM GOAL #2   Title  Pt will wean to single point cane for household and short community distances.    Time  4    Period  Weeks    Status  Achieved    Target Date  04/22/19      PT SHORT TERM GOAL #4   Title  Pt will be able to perform light household duties x 30 min without exacerbation of Lt knee pain.    Time  4    Period  Weeks    Status  Achieved    Target Date  04/29/19      PT SHORT TERM GOAL #5   Title  Pt will be ind with dressing Lt LE, with bathing (able to step over 14" tub ledge), and report greater ease with toilet transfers.    Time  4    Period  Weeks    Target Date  04/29/19        PT Long Term Goals - 05/06/19 1932      PT LONG TERM GOAL #5   Title  Pt will ambulate >/= 500' with 6 min walk test using LRAD or without AD.    Time  8    Period  Weeks    Status  Achieved            Plan - 05/06/19 1926    Clinical Impression Statement  Pt arrives with no pain  just knee stiffness. Pt was able to achieve 720 feet in the 6 min walk test today that included both indoor and outdoor terrain. This met long term goal.Pt's TKe strength continues to improve. Pt's hip musculature very fatigued with resisted walked walking, most notably hip abductors.    Personal Factors and Comorbidities  Time since onset of injury/illness/exacerbation    Examination-Activity Limitations  Transfers;Bend;Locomotion Level;Toileting;Stairs    Examination-Participation Restrictions  Driving;Community Activity;Cleaning;Meal Prep;Laundry    Stability/Clinical Decision Making  Stable/Uncomplicated    Rehab Potential  Good    PT Frequency  3x / week    PT Duration  8 weeks    PT Treatment/Interventions  ADLs/Self Care Home Management;Aquatic Therapy;Electrical Stimulation;Cryotherapy;DME Instruction;Gait training;Stair training;Functional mobility training;Therapeutic activities;Therapeutic exercise;Balance training;Neuromuscular re-education;Manual techniques;Patient/family education;Passive range of motion;Dry needling;Taping;Vasopneumatic Device;Joint Manipulations    PT Next Visit Plan  Continue knee strength & hip strength    Consulted and Agree with Plan of Care  Patient       Patient will benefit from skilled therapeutic intervention in order to improve the following deficits and impairments:  Abnormal gait, Decreased range of motion, Difficulty walking, Decreased endurance, Pain, Decreased scar mobility, Impaired flexibility, Hypomobility, Decreased mobility, Decreased strength, Increased edema  Visit Diagnosis: Muscle weakness (generalized)  Stiffness of left knee, not elsewhere classified  Localized edema  Other abnormalities of gait and mobility  Left knee pain, unspecified chronicity     Problem List Patient Active Problem List   Diagnosis Date Noted  . Hypokalemia 06/27/2018  . Overweight (BMI 25.0-29.9) 06/27/2018  . S/P right TKA 06/26/2018  . Autoimmune  disease ([place]) 03/15/2016  . High risk medication use 03/15/2016  . Pain in joint of right shoulder 03/15/2016  . Bilateral hip  pain 03/15/2016  . Primary osteoarthritis of both knees 03/15/2016  . 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 Arthritis 02/24/2014  . HTN (hypertension) 02/24/2014  . Hyperlipidemia 02/24/2014  . Breast cancer, right, upper outer quadrant  07/05/2010    Fain Francis, PTA 05/06/2019, 7:33 PM  [facility] [address] [place], [place], [address] Phone: [phone]   Fax:  [phone]  Name: Yolanda Peters MRN: [id number] Date of Birth: 06-08-1940

## 2019-05-08 ENCOUNTER — Other Ambulatory Visit: Payer: Self-pay

## 2019-05-08 ENCOUNTER — Encounter: Payer: Self-pay | Admitting: Physical Therapy

## 2019-05-08 ENCOUNTER — Ambulatory Visit: Payer: Medicare Other | Admitting: Physical Therapy

## 2019-05-08 DIAGNOSIS — R600 Localized edema: Secondary | ICD-10-CM

## 2019-05-08 DIAGNOSIS — M25662 Stiffness of left knee, not elsewhere classified: Secondary | ICD-10-CM

## 2019-05-08 DIAGNOSIS — M25562 Pain in left knee: Secondary | ICD-10-CM | POA: Diagnosis not present

## 2019-05-08 DIAGNOSIS — R2689 Other abnormalities of gait and mobility: Secondary | ICD-10-CM

## 2019-05-08 DIAGNOSIS — M6281 Muscle weakness (generalized): Secondary | ICD-10-CM

## 2019-05-08 NOTE — Therapy (Signed)
[facility] [address] [place], [place], [address] Phone: [phone]   Fax:  [phone]  Physical Therapy Treatment  Patient Details  Name: Yolanda Peters MRN: [id number] Date of Birth: 04/29/1940 Referring Provider (PT): Dr. Rema Jasmine   Encounter Date: 05/08/2019  PT End of Session - 05/08/19 1105    Visit Number  16    Date for PT Re-Evaluation  05/27/19    Authorization Type  UHC Medicare    Progress Note Due on Visit  20    PT Start Time  1021    PT Stop Time  1120    PT Time Calculation (min)  59 min    Activity Tolerance  Patient tolerated treatment well    Behavior During Therapy  [facility] for tasks assessed/performed       Past Medical History:  Diagnosis Date  . Allergy   . Arthritis   . Breast cancer [facility]) 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  . Cancer ([place])    right breast   . Hyperlipidemia    takes Niacin  . Hypertension   . Personal history of radiation therapy 2011   rt breast    Past Surgical History:  Procedure Laterality Date  . BREAST DUCTAL SYSTEM EXCISION     right breast  . BREAST LUMPECTOMY    . BREAST SURGERY  august 2011   right partial mastectomy  . CATARACT EXTRACTION     bilateral  . CESAREAN SECTION    . ELBOW SURGERY     left  . JOINT REPLACEMENT    . KNEE ARTHROPLASTY    . MYOMECTOMY    . TOTAL KNEE ARTHROPLASTY Right 06/26/2018   Procedure: TOTAL KNEE ARTHROPLASTY;  Surgeon: Paralee Cancel, MD;  [facility]: [facility];  Service: Orthopedics;  Laterality: Right;  70 mins    There were no vitals filed for this visit.  Subjective Assessment - 05/08/19 1025    Subjective  My 2 sisters are visiting me and we are having a great time, No complaints today.    Pertinent History  Rt TKR last year    Currently in Pain?  No/denies                       [facility] Adult PT Treatment/Exercise - 05/08/19 0001      Knee/Hip Exercises: Aerobic   Recumbent Bike  L1 x 6 min with  RPMS > 30      Knee/Hip Exercises: Machines for Strengthening   Cybex Leg Press  Seat 8 Bil 65# 15x 70# 10x, LTLE 30# 2x15       Knee/Hip Exercises: Standing   Walking with Sports Cord  6x each direction 20# light CGA for balance   Pt able to take bigger, faster steps today     Knee/Hip Exercises: Seated   Long Arc Quad  Strengthening;Left;3 sets;10 reps;Weights    Long Arc Quad Weight  4 lbs.   Pt did not do 5# last sesison, it was 4#   Long [employer] Limitations  Some PTA assistance for TKE and static hold      Vasopneumatic   Number Minutes Vasopneumatic   15 minutes    Vasopnuematic Location   Knee    Vasopneumatic Pressure  Medium    Vasopneumatic Temperature   three snowflakes               PT Short Term Goals - 04/24/19 1120  PT SHORT TERM GOAL #1   Title  Pt will be compliant and ind with initial HEP and edema management strategies    Time  2    Period  Weeks    Status  Achieved    Target Date  04/15/19      PT SHORT TERM GOAL #2   Title  Pt will wean to single point cane for household and short community distances.    Time  4    Period  Weeks    Status  Achieved    Target Date  04/22/19      PT SHORT TERM GOAL #4   Title  Pt will be able to perform light household duties x 30 min without exacerbation of Lt knee pain.    Time  4    Period  Weeks    Status  Achieved    Target Date  04/29/19      PT SHORT TERM GOAL #5   Title  Pt will be ind with dressing Lt LE, with bathing (able to step over 14" tub ledge), and report greater ease with toilet transfers.    Time  4    Period  Weeks    Target Date  04/29/19        PT Long Term Goals - 05/06/19 1932      PT LONG TERM GOAL #5   Title  Pt will ambulate >/= 500' with 6 min walk test using LRAD or without AD.    Time  8    Period  Weeks    Status  Achieved            Plan - 05/08/19 1026    Clinical Impression Statement  Pt able to take bigger and faster steps when performing her  resisted walking in a four directions today. Continuing work on Astronomer, especially TKE. Pt is planning on returning to her water exercises once MD gives her the go ahead.    Personal Factors and Comorbidities  Time since onset of injury/illness/exacerbation    Examination-Activity Limitations  Transfers;Bend;Locomotion Level;Toileting;Stairs    Examination-Participation Restrictions  Driving;Community Activity;Cleaning;Meal Prep;Laundry    Stability/Clinical Decision Making  Stable/Uncomplicated    Rehab Potential  Good    PT Frequency  3x / week    PT Duration  8 weeks    PT Treatment/Interventions  ADLs/Self Care Home Management;Aquatic Therapy;Electrical Stimulation;Cryotherapy;DME Instruction;Gait training;Stair training;Functional mobility training;Therapeutic activities;Therapeutic exercise;Balance training;Neuromuscular re-education;Manual techniques;Patient/family education;Passive range of motion;Dry needling;Taping;Vasopneumatic Device;Joint Manipulations    PT Next Visit Plan  Continue knee strength & hip strength    PT Home Exercise Plan  start medbridge to progress HEP from initial post-op protocol    Consulted and Agree with Plan of Care  Patient       Patient will benefit from skilled therapeutic intervention in order to improve the following deficits and impairments:  Abnormal gait, Decreased range of motion, Difficulty walking, Decreased endurance, Pain, Decreased scar mobility, Impaired flexibility, Hypomobility, Decreased mobility, Decreased strength, Increased edema  Visit Diagnosis: Muscle weakness (generalized)  Stiffness of left knee, not elsewhere classified  Localized edema  Other abnormalities of gait and mobility  Left knee pain, unspecified chronicity     Problem List Patient Active Problem List   Diagnosis Date Noted  . Hypokalemia 06/27/2018  . Overweight (BMI 25.0-29.9) 06/27/2018  . S/P right TKA 06/26/2018  . Autoimmune disease ([place])  03/15/2016  . High risk medication use 03/15/2016  . Pain in joint of right  shoulder 03/15/2016  . Bilateral hip pain 03/15/2016  . Primary osteoarthritis of both knees 03/15/2016  . 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 Arthritis 02/24/2014  . HTN (hypertension) 02/24/2014  . Hyperlipidemia 02/24/2014  . Breast cancer, right, upper outer quadrant  07/05/2010    Kaimani Clayson, PTA 05/08/2019, 11:07 AM  [facility] [address], [place], [place], [address] Phone: [phone]   Fax:  [phone]  Name: Yolanda Peters MRN: [id number] Date of Birth: July 24, 1940

## 2019-05-10 ENCOUNTER — Encounter: Payer: Self-pay | Admitting: Physical Therapy

## 2019-05-10 ENCOUNTER — Other Ambulatory Visit: Payer: Self-pay

## 2019-05-10 ENCOUNTER — Ambulatory Visit: Payer: Medicare Other | Admitting: Physical Therapy

## 2019-05-10 DIAGNOSIS — R600 Localized edema: Secondary | ICD-10-CM

## 2019-05-10 DIAGNOSIS — R2689 Other abnormalities of gait and mobility: Secondary | ICD-10-CM

## 2019-05-10 DIAGNOSIS — M6281 Muscle weakness (generalized): Secondary | ICD-10-CM

## 2019-05-10 DIAGNOSIS — M25662 Stiffness of left knee, not elsewhere classified: Secondary | ICD-10-CM

## 2019-05-10 DIAGNOSIS — M25562 Pain in left knee: Secondary | ICD-10-CM | POA: Diagnosis not present

## 2019-05-10 NOTE — Therapy (Signed)
[facility] [address] [place], [place], [address] Phone: [phone]   Fax:  [phone]  Physical Therapy Treatment  Patient Details  Name: Yolanda Peters MRN: [id number] Date of Birth: 11/13/1940 Referring Provider (PT): Dr. Rema Jasmine   Encounter Date: 05/10/2019  PT End of Session - 05/10/19 1022    Visit Number  17    Date for PT Re-Evaluation  05/27/19    Authorization Type  UHC Medicare    Progress Note Due on Visit  20    PT Start Time  1016    PT Stop Time  1110    PT Time Calculation (min)  54 min    Activity Tolerance  Patient tolerated treatment well    Behavior During Therapy  [facility] for tasks assessed/performed       Past Medical History:  Diagnosis Date  . Allergy   . Arthritis   . Breast cancer [facility]) 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  . Cancer ([place])    right breast   . Hyperlipidemia    takes Niacin  . Hypertension   . Personal history of radiation therapy 2011   rt breast    Past Surgical History:  Procedure Laterality Date  . BREAST DUCTAL SYSTEM EXCISION     right breast  . BREAST LUMPECTOMY    . BREAST SURGERY  august 2011   right partial mastectomy  . CATARACT EXTRACTION     bilateral  . CESAREAN SECTION    . ELBOW SURGERY     left  . JOINT REPLACEMENT    . KNEE ARTHROPLASTY    . MYOMECTOMY    . TOTAL KNEE ARTHROPLASTY Right 06/26/2018   Procedure: TOTAL KNEE ARTHROPLASTY;  Surgeon: Paralee Cancel, MD;  Location: [facility];  Service: Orthopedics;  Laterality: Right;  70 mins    There were no vitals filed for this visit.  Subjective Assessment - 05/10/19 1021    Subjective  Just a little stiff this morning.    Pertinent History  Rt TKR last year    Limitations  House hold activities;Standing;Walking;Other (comment)    How long can you stand comfortably?  20+    How long can you walk comfortably?  40' but pain later    Patient Stated Goals  go down 5 back steps with 2 rails to  access backyard, be able to drive by early April    Currently in Pain?  Yes    Pain Score  2     Pain Location  Knee    Pain Orientation  Left    Pain Descriptors / Indicators  Tightness    Pain Type  Surgical pain    Pain Onset  More than a month ago    Pain Frequency  Intermittent    Aggravating Factors   walk/stand    Pain Relieving Factors  meds, ice, stretch                       [facility] Adult PT Treatment/Exercise - 05/10/19 0001      Knee/Hip Exercises: Stretches   Other Knee/Hip Stretches  Lt knee flexion and ext foot on 2nd step x 10 reps for knee flexion ROM and hamstring stretch/knee ext range, hold each x 5 sec      Knee/Hip Exercises: Aerobic   Recumbent Bike  L1 x 6 min with RPMS > 30      Knee/Hip Exercises: Machines for Strengthening  Cybex Knee Extension  10lb bil concentric, Lt only eccentric x 20 reps    Cybex Knee Flexion  20# bil 2x15 with focus on Lt for most effort, Rt to assist end range knee flexion    Cybex Leg Press  Seat 8 Bil 65# 15x 70# 10x, LTLE 30# 2x15       Knee/Hip Exercises: Standing   Walking with Sports Cord  5x each way 20lb with supervision for safety, PT cued larger power steps for improved LE muscle recruitment    Other Standing Knee Exercises  rebounder: Lt SLS 2x20, high knee marching 2x20 reps, stagger stance weight shifts x 30 sec each      Vasopneumatic   Number Minutes Vasopneumatic   15 minutes    Vasopnuematic Location   Knee    Vasopneumatic Pressure  Medium    Vasopneumatic Temperature   three snowflakes               PT Short Term Goals - 04/24/19 1120      PT SHORT TERM GOAL #1   Title  Pt will be compliant and ind with initial HEP and edema management strategies    Time  2    Period  Weeks    Status  Achieved    Target Date  04/15/19      PT SHORT TERM GOAL #2   Title  Pt will wean to single point cane for household and short community distances.    Time  4    Period  Weeks    Status   Achieved    Target Date  04/22/19      PT SHORT TERM GOAL #4   Title  Pt will be able to perform light household duties x 30 min without exacerbation of Lt knee pain.    Time  4    Period  Weeks    Status  Achieved    Target Date  04/29/19      PT SHORT TERM GOAL #5   Title  Pt will be ind with dressing Lt LE, with bathing (able to step over 14" tub ledge), and report greater ease with toilet transfers.    Time  4    Period  Weeks    Target Date  04/29/19        PT Long Term Goals - 05/06/19 1932      PT LONG TERM GOAL #5   Title  Pt will ambulate >/= 500' with 6 min walk test using LRAD or without AD.    Time  8    Period  Weeks    Status  Achieved            Plan - 05/10/19 1056    Clinical Impression Statement  PT trialed cybex knee flexion and extension for strength progression today with good tolerance.  She used bil LEs but with focus on Rt only for assistance.  She was able to demo improved step length and power with sports cords walkouts today although was fatigued end of exercise.  Edema is present in Lt leg but with much improved pitting and degree of edema.  Pt continues to be increasing activity in community with good tolerance.  Ongoing end range weakness in knee flexors and extensors on Lt.  Continue along POC with focus on strength progression and gait symmetry as end range motion improves.    PT Frequency  3x / week    PT Duration  8 weeks  PT Treatment/Interventions  ADLs/Self Care Home Management;Aquatic Therapy;Electrical Stimulation;Cryotherapy;DME Instruction;Gait training;Stair training;Functional mobility training;Therapeutic activities;Therapeutic exercise;Balance training;Neuromuscular re-education;Manual techniques;Patient/family education;Passive range of motion;Dry needling;Taping;Vasopneumatic Device;Joint Manipulations    PT Next Visit Plan  Continue knee strength & hip strength    Consulted and Agree with Plan of Care  Patient       Patient  will benefit from skilled therapeutic intervention in order to improve the following deficits and impairments:     Visit Diagnosis: Muscle weakness (generalized)  Stiffness of left knee, not elsewhere classified  Localized edema  Other abnormalities of gait and mobility  Left knee pain, unspecified chronicity     Problem List Patient Active Problem List   Diagnosis Date Noted  . Hypokalemia 06/27/2018  . Overweight (BMI 25.0-29.9) 06/27/2018  . S/P right TKA 06/26/2018  . Autoimmune disease ([place]) 03/15/2016  . High risk medication use 03/15/2016  . Pain in joint of right shoulder 03/15/2016  . Bilateral hip pain 03/15/2016  . Primary osteoarthritis of both knees 03/15/2016  . 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 Arthritis 02/24/2014  . HTN (hypertension) 02/24/2014  . Hyperlipidemia 02/24/2014  . Breast cancer, right, upper outer quadrant  07/05/2010    Baruch Merl, PT 05/10/19 11:01 AM   [facility] [address] [place], [place], [address] Phone: [phone]   Fax:  [phone]  Name: Yolanda Peters MRN: [id number] Date of Birth: 10-06-1940

## 2019-05-13 ENCOUNTER — Encounter: Payer: Self-pay | Admitting: Physical Therapy

## 2019-05-13 ENCOUNTER — Other Ambulatory Visit: Payer: Self-pay

## 2019-05-13 ENCOUNTER — Ambulatory Visit: Payer: Medicare Other | Attending: Orthopaedic Surgery | Admitting: Physical Therapy

## 2019-05-13 DIAGNOSIS — R2689 Other abnormalities of gait and mobility: Secondary | ICD-10-CM | POA: Insufficient documentation

## 2019-05-13 DIAGNOSIS — M25662 Stiffness of left knee, not elsewhere classified: Secondary | ICD-10-CM | POA: Diagnosis present

## 2019-05-13 DIAGNOSIS — M25562 Pain in left knee: Secondary | ICD-10-CM

## 2019-05-13 DIAGNOSIS — R600 Localized edema: Secondary | ICD-10-CM | POA: Insufficient documentation

## 2019-05-13 DIAGNOSIS — M6281 Muscle weakness (generalized): Secondary | ICD-10-CM | POA: Insufficient documentation
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
## 2019-05-15 ENCOUNTER — Encounter: Payer: Self-pay | Admitting: Physical Therapy

## 2019-05-15 ENCOUNTER — Ambulatory Visit: Payer: Medicare Other | Admitting: Physical Therapy

## 2019-05-15 ENCOUNTER — Other Ambulatory Visit: Payer: Self-pay

## 2019-05-15 DIAGNOSIS — M6281 Muscle weakness (generalized): Secondary | ICD-10-CM | POA: Diagnosis not present

## 2019-05-15 DIAGNOSIS — M25662 Stiffness of left knee, not elsewhere classified: Secondary | ICD-10-CM

## 2019-05-15 DIAGNOSIS — R600 Localized edema: Secondary | ICD-10-CM

## 2019-05-15 DIAGNOSIS — R2689 Other abnormalities of gait and mobility: Secondary | ICD-10-CM

## 2019-05-15 NOTE — Therapy (Signed)
[facility] [address] [place], [place], [address] Phone: [phone]   Fax:  [phone]  Physical Therapy Treatment  Patient Details  Name: Yolanda Peters MRN: [id number] Date of Birth: 1940-10-16 Referring Provider (PT): Dr. Rema Jasmine   Encounter Date: 05/15/2019  PT End of Session - 05/15/19 1019    Visit Number  19    Date for PT Re-Evaluation  05/27/19    Authorization Type  UHC Medicare    Progress Note Due on Visit  20    PT Start Time  1015    PT Stop Time  1055    PT Time Calculation (min)  40 min    Activity Tolerance  Patient tolerated treatment well    Behavior During Therapy  [facility] for tasks assessed/performed       Past Medical History:  Diagnosis Date  . Allergy   . Arthritis   . Breast cancer [facility]) 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  . Cancer ([place])    right breast   . Hyperlipidemia    takes Niacin  . Hypertension   . Personal history of radiation therapy 2011   rt breast    Past Surgical History:  Procedure Laterality Date  . BREAST DUCTAL SYSTEM EXCISION     right breast  . BREAST LUMPECTOMY    . BREAST SURGERY  august 2011   right partial mastectomy  . CATARACT EXTRACTION     bilateral  . CESAREAN SECTION    . ELBOW SURGERY     left  . JOINT REPLACEMENT    . KNEE ARTHROPLASTY    . MYOMECTOMY    . TOTAL KNEE ARTHROPLASTY Right 06/26/2018   Procedure: TOTAL KNEE ARTHROPLASTY;  Surgeon: Paralee Cancel, MD;  [facility];  Service: Orthopedics;  Laterality: Right;  70 mins    There were no vitals filed for this visit.  Subjective Assessment - 05/15/19 1018    Subjective  I have gone backwards this week.  I have more pain and stiffness.  Difficulty with tub, chair and car transfers.  It seems to be improving slowly over the week.  I used a lidocaine patch on the knee.    Pertinent History  Rt TKR last year    Limitations  House hold activities;Standing;Walking;Other (comment)    How long can you stand comfortably?  20+    How long can you walk comfortably?  41' but pain later    Patient Stated Goals  go down 5 back steps with 2 rails to access backyard, be able to drive by early April    Currently in Pain?  Yes    Pain Score  2     Pain Location  Knee    Pain Orientation  Left    Pain Descriptors / Indicators  Aching    Pain Type  Surgical pain    Pain Onset  More than a month ago    Pain Frequency  Intermittent    Aggravating Factors   bending, transfers    Pain Relieving Factors  meds, ice, stretch                       [facility] Adult PT Treatment/Exercise - 05/15/19 0001      Knee/Hip Exercises: Stretches   Active Hamstring Stretch  Left;2 reps;30 seconds    Active Hamstring Stretch Limitations  foot on 2nd step    Gastroc Stretch  Left;2 reps;30 seconds  Gastroc Stretch Limitations  slant board      Knee/Hip Exercises: Aerobic   Recumbent Bike  L1 x 8' after manual therapy, PT present to monitor range/pain      Knee/Hip Exercises: Supine   Other Supine Knee/Hip Exercises  Lt knee to chest stretch with overpressure 10x5 sec    Other Supine Knee/Hip Exercises  A/ROM knee ext with quad set 10x5 sec      Manual Therapy   Manual Therapy  Soft tissue mobilization;Joint mobilization;Edema management;Passive ROM    Edema Management  5 min LTLE retrograde massage    Joint Mobilization  Lt patellar mobs med/lat/sup/inf    Soft tissue mobilization  inferior aspect of scar mobs, peripatellar, posterior knee, medial gastroc and medial hamstring just below and above knee, Lt    Passive ROM  Lt knee flexion and extension               PT Short Term Goals - 04/24/19 1120      PT SHORT TERM GOAL #1   Title  Pt will be compliant and ind with initial HEP and edema management strategies    Time  2    Period  Weeks    Status  Achieved    Target Date  04/15/19      PT SHORT TERM GOAL #2   Title  Pt will wean to single point cane for  household and short community distances.    Time  4    Period  Weeks    Status  Achieved    Target Date  04/22/19      PT SHORT TERM GOAL #4   Title  Pt will be able to perform light household duties x 30 min without exacerbation of Lt knee pain.    Time  4    Period  Weeks    Status  Achieved    Target Date  04/29/19      PT SHORT TERM GOAL #5   Title  Pt will be ind with dressing Lt LE, with bathing (able to step over 14" tub ledge), and report greater ease with toilet transfers.    Time  4    Period  Weeks    Target Date  04/29/19        PT Long Term Goals - 05/06/19 1932      PT LONG TERM GOAL #5   Title  Pt will ambulate >/= 500' with 6 min walk test using LRAD or without AD.    Time  8    Period  Weeks    Status  Achieved            Plan - 05/15/19 1046    Clinical Impression Statement  Pt reported flare up this week since Mon.  Wasn't sure if it was weather or activity related.  PT focused on manual techniques for P/ROM, scar mobs to inferior aspect, patellar mobs and STM to peripatellar region and posterior knee at proximal medial gastroc and medial hamstring.  Pt reported less ache and greater ease of motion end of session.  She will continue to benefit from ongoing assessment of response to interventions as her activity level and strength progresses.    Rehab Potential  Good    PT Frequency  3x / week    PT Duration  8 weeks    PT Treatment/Interventions  ADLs/Self Care Home Management;Aquatic Therapy;Electrical Stimulation;Cryotherapy;DME Instruction;Gait training;Stair training;Functional mobility training;Therapeutic activities;Therapeutic exercise;Balance training;Neuromuscular re-education;Manual techniques;Patient/family education;Passive range of motion;Dry  needling;Taping;Vasopneumatic Device;Joint Manipulations    PT Next Visit Plan  20th visit PN, f/u on knee pain, strength and functional dynamic gait Lt LE, gastroc/ham stretch    PT Home Exercise Plan   Your Access [email or web address]    Consulted and Agree with Plan of Care  Patient       Patient will benefit from skilled therapeutic intervention in order to improve the following deficits and impairments:     Visit Diagnosis: Muscle weakness (generalized)  Stiffness of left knee, not elsewhere classified  Localized edema  Other abnormalities of gait and mobility     Problem List Patient Active Problem List   Diagnosis Date Noted  . Hypokalemia 06/27/2018  . Overweight (BMI 25.0-29.9) 06/27/2018  . S/P right TKA 06/26/2018  . Autoimmune disease ([place]) 03/15/2016  . High risk medication use 03/15/2016  . Pain in joint of right shoulder 03/15/2016  . Bilateral hip pain 03/15/2016  . Primary osteoarthritis of both knees 03/15/2016  . 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 Arthritis 02/24/2014  . HTN (hypertension) 02/24/2014  . Hyperlipidemia 02/24/2014  . Breast cancer, right, upper outer quadrant  07/05/2010    Marzelle Rutten, PT 05/15/19 10:50 AM   [facility] [address] [place], [place], [address] Phone: [phone]   Fax:  [phone]  Name: LEONDRA CAMARDA MRN: [id number] Date of Birth: 1940/05/04

## 2019-05-17 ENCOUNTER — Encounter: Payer: Self-pay | Admitting: Physical Therapy

## 2019-05-17 ENCOUNTER — Ambulatory Visit: Payer: Medicare Other | Admitting: Physical Therapy

## 2019-05-17 ENCOUNTER — Other Ambulatory Visit: Payer: Self-pay

## 2019-05-17 DIAGNOSIS — R2689 Other abnormalities of gait and mobility: Secondary | ICD-10-CM

## 2019-05-17 DIAGNOSIS — M25562 Pain in left knee: Secondary | ICD-10-CM

## 2019-05-17 DIAGNOSIS — M25662 Stiffness of left knee, not elsewhere classified: Secondary | ICD-10-CM

## 2019-05-17 DIAGNOSIS — M6281 Muscle weakness (generalized): Secondary | ICD-10-CM | POA: Diagnosis not present

## 2019-05-17 DIAGNOSIS — R600 Localized edema: Secondary | ICD-10-CM

## 2019-05-17 NOTE — Therapy (Signed)
[facility] [address] [place], [place], [address] Phone: [phone]   Fax:  [phone]  Physical Therapy Treatment  Patient Details  Name: Yolanda Peters MRN: [id number] Date of Birth: 03-Sep-1940 Referring Provider (PT): Dr. Rema Jasmine  Progress Note Reporting Period 04/25/19 to 05/17/19  See note below for Objective Data and Assessment of Progress/Goals.       Encounter Date: 05/17/2019  PT End of Session - 05/17/19 1022    Visit Number  20    Date for PT Re-Evaluation  05/27/19    Authorization Type  UHC Medicare    Progress Note Due on Visit  30    PT Start Time  1018    PT Stop Time  1106    PT Time Calculation (min)  48 min    Equipment Utilized During Treatment  Other (comment)    Activity Tolerance  Patient tolerated treatment well    Behavior During Therapy  WFL for tasks assessed/performed       Past Medical History:  Diagnosis Date  . Allergy   . Arthritis   . Breast cancer [facility]) 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  . Cancer ([place])    right breast   . Hyperlipidemia    takes Niacin  . Hypertension   . Personal history of radiation therapy 2011   rt breast    Past Surgical History:  Procedure Laterality Date  . BREAST DUCTAL SYSTEM EXCISION     right breast  . BREAST LUMPECTOMY    . BREAST SURGERY  august 2011   right partial mastectomy  . CATARACT EXTRACTION     bilateral  . CESAREAN SECTION    . ELBOW SURGERY     left  . JOINT REPLACEMENT    . KNEE ARTHROPLASTY    . MYOMECTOMY    . TOTAL KNEE ARTHROPLASTY Right 06/26/2018   Procedure: TOTAL KNEE ARTHROPLASTY;  Surgeon: Paralee Cancel, MD;  Location: [facility];  Service: Orthopedics;  Laterality: Right;  70 mins    There were no vitals filed for this visit.      [facility] PT Assessment - 05/17/19 0001      Assessment   Referring Provider (PT)  Dr. Rema Jasmine    Onset Date/Surgical Date  03/28/19    Hand Dominance  Right    Next  MD Visit  04/10/19    Prior Therapy  yes at this facility for other knee      Observation/Other Assessments   Focus on Therapeutic Outcomes (FOTO)   42%, goal 33%      Observation/Other Assessments-Edema    Edema  Circumferential      Circumferential Edema   Circumferential - Right  16 inches    Circumferential - Left   16.5 inches      ROM / Strength   AROM / PROM / Strength  AROM;Strength      AROM   AROM Assessment Site  Knee    Right/Left Knee  Left    Right Knee Extension  0    Right Knee Flexion  125    Left Knee Extension  -2    Left Knee Flexion  122      Strength   Strength Assessment Site  Hip    Right/Left Hip  Left    Left Hip External Rotation  4-/5    Left Hip ABduction  4-/5    Right/Left Knee  Left    Left Knee  Flexion  4/5    Left Knee Extension  4+/5                   [facility] Adult PT Treatment/Exercise - 05/17/19 0001      Exercises   Exercises  Knee/Hip      Knee/Hip Exercises: Stretches   Active Hamstring Stretch  Left;2 reps;30 seconds    Active Hamstring Stretch Limitations  foot on 2nd step    Gastroc Stretch  Left;2 reps;30 seconds    Gastroc Stretch Limitations  slant board    Other Knee/Hip Stretches  knee flexion Lt foot on 2nd step 5x10 sec holds      Knee/Hip Exercises: Aerobic   Recumbent Bike  L1 x 6', PT present to do FOTO       Knee/Hip Exercises: Machines for Strengthening   Cybex Leg Press  Seat 8 Bil 75# 2x10, 1x10 30# Lt only      Knee/Hip Exercises: Supine   Quad Sets  Strengthening;Left;10 reps    Quad Sets Limitations  towel under ankle to improve end range extension    Bridges with Clamshell  Strengthening;15 reps   green band   Straight Leg Raises  Strengthening;Left;20 reps    Straight Leg Raises Limitations  1#    Other Supine Knee/Hip Exercises  green clam outer half of range 2x15      Knee/Hip Exercises: Sidelying   Hip ABduction  Strengthening;Left;1 set;10 reps      Modalities   Modalities   Cryotherapy      Cryotherapy   Number Minutes Cryotherapy  10 Minutes    Cryotherapy Location  Knee    Type of Cryotherapy  Ice pack   anterior and posterior              PT Short Term Goals - 04/24/19 1120      PT SHORT TERM GOAL #1   Title  Pt will be compliant and ind with initial HEP and edema management strategies    Time  2    Period  Weeks    Status  Achieved    Target Date  04/15/19      PT SHORT TERM GOAL #2   Title  Pt will wean to single point cane for household and short community distances.    Time  4    Period  Weeks    Status  Achieved    Target Date  04/22/19      PT SHORT TERM GOAL #4   Title  Pt will be able to perform light household duties x 30 min without exacerbation of Lt knee pain.    Time  4    Period  Weeks    Status  Achieved    Target Date  04/29/19      PT SHORT TERM GOAL #5   Title  Pt will be ind with dressing Lt LE, with bathing (able to step over 14" tub ledge), and report greater ease with toilet transfers.    Time  4    Period  Weeks    Target Date  04/29/19        PT Long Term Goals - 05/17/19 1023      PT LONG TERM GOAL #1   Title  Pt will be ind in advanced HEP and understand how to safely progress.    Status  On-going      PT LONG TERM GOAL #2   Title  reduce FOTO to </= 33%  to demo less limitation    Baseline  42% 05/17/19    Status  On-going      PT LONG TERM GOAL #3   Title  Pt will be able to perform community activities >/= 30 min without AD or with LRAD with symmetical gait pattern.    Status  Achieved      PT LONG TERM GOAL #4   Title  Pt will achieve strength in Lt LE (hip, knee and ankle) of at least 4+/5 to improve functional tasks such as gait endurance, stairs, transfers.    Status  On-going      PT LONG TERM GOAL #5   Title  Pt will ambulate >/= 500' with 6 min walk test using LRAD or without AD.    Status  Achieved            Plan - 05/17/19 1207    Clinical Impression Statement  Pt has  met all STGs and is well on her way toward LTGs.  She has nearly symmetrical gait pattern without AD which improves with ther ex stretching/ROM.  She has some end range weakness for extension and flexion.  Rt knee strength is 4/5 for flexion and 4+/5 for extension but with 4/5 at end range.  Lt hip abd and ER are 4-/5 so PT focused on these muscle groups more today.  Edema differs by .5 inch measured circumferentially.  She has some mild flexibility deficits in gastroc and hamstring.  FOTO score is 42% with goal of 33%.  Pt sees MD next week and hopes to be cleared to return to her pool ther ex routine.  PT discussed tapering to 1-2x/week at re-evaluation date mid-May with supplemental pool exericses done independently.  Continue along POC.    Rehab Potential  Good    PT Frequency  3x / week    PT Duration  8 weeks    PT Treatment/Interventions  ADLs/Self Care Home Management;Aquatic Therapy;Electrical Stimulation;Cryotherapy;DME Instruction;Gait training;Stair training;Functional mobility training;Therapeutic activities;Therapeutic exercise;Balance training;Neuromuscular re-education;Manual techniques;Patient/family education;Passive range of motion;Dry needling;Taping;Vasopneumatic Device;Joint Manipulations    PT Next Visit Plan  continue Lt hip abd and ER strength, end range knee strength, functional gait, Lt SLS with vectors and UE support    PT Home Exercise Plan  Your Access [email or web address]    Consulted and Agree with Plan of Care  Patient       Patient will benefit from skilled therapeutic intervention in order to improve the following deficits and impairments:     Visit Diagnosis: Muscle weakness (generalized)  Stiffness of left knee, not elsewhere classified  Localized edema  Other abnormalities of gait and mobility  Left knee pain, unspecified chronicity     Problem List Patient Active Problem List   Diagnosis Date Noted  . Hypokalemia 06/27/2018  . Overweight (BMI 25.0-29.9)  06/27/2018  . S/P right TKA 06/26/2018  . Autoimmune disease ([place]) 03/15/2016  . High risk medication use 03/15/2016  . Pain in joint of right shoulder 03/15/2016  . Bilateral hip pain 03/15/2016  . Primary osteoarthritis of both knees 03/15/2016  . 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 Arthritis 02/24/2014  . HTN (hypertension) 02/24/2014  . Hyperlipidemia 02/24/2014  . Breast cancer, right, upper outer quadrant  07/05/2010    Baruch Merl, PT 05/17/19 12:14 PM   [facility] [address], [place], [place], [address] Phone: [phone]   Fax:  [phone]  Name: Yolanda Peters MRN: [id number] Date  of Birth: 1940-12-11

## 2019-05-20 ENCOUNTER — Ambulatory Visit: Payer: Medicare Other | Admitting: Physical Therapy

## 2019-05-20 ENCOUNTER — Other Ambulatory Visit: Payer: Self-pay

## 2019-05-20 ENCOUNTER — Encounter: Payer: Self-pay | Admitting: Physical Therapy

## 2019-05-20 DIAGNOSIS — M25562 Pain in left knee: Secondary | ICD-10-CM

## 2019-05-20 DIAGNOSIS — R2689 Other abnormalities of gait and mobility: Secondary | ICD-10-CM

## 2019-05-20 DIAGNOSIS — M25662 Stiffness of left knee, not elsewhere classified: Secondary | ICD-10-CM

## 2019-05-20 DIAGNOSIS — R600 Localized edema: Secondary | ICD-10-CM

## 2019-05-20 DIAGNOSIS — M6281 Muscle weakness (generalized): Secondary | ICD-10-CM | POA: Diagnosis not present

## 2019-05-20 NOTE — Therapy (Signed)
[facility] [address] [place], [place], [address] Phone: [phone]   Fax:  [phone]  Physical Therapy Treatment  Patient Details  Name: Yolanda Peters MRN: [id number] Date of Birth: [birth year] Referring Provider (PT): Dr. Rema Jasmine   Encounter Date: 05/20/2019  PT End of Session - 05/20/19 1021    Visit Number  21    Date for PT Re-Evaluation  05/27/19    Authorization Type  UHC Medicare    Progress Note Due on Visit  30    PT Start Time  1013    PT Stop Time  1105    PT Time Calculation (min)  52 min    Activity Tolerance  Patient tolerated treatment well    Behavior During Therapy  [facility] for tasks assessed/performed       Past Medical History:  Diagnosis Date  . Allergy   . Arthritis   . Breast cancer [facility]) 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  . Cancer ([place])    right breast   . Hyperlipidemia    takes Niacin  . Hypertension   . Personal history of radiation therapy 2011   rt breast    Past Surgical History:  Procedure Laterality Date  . BREAST DUCTAL SYSTEM EXCISION     right breast  . BREAST LUMPECTOMY    . BREAST SURGERY  august 2011   right partial mastectomy  . CATARACT EXTRACTION     bilateral  . CESAREAN SECTION    . ELBOW SURGERY     left  . JOINT REPLACEMENT    . KNEE ARTHROPLASTY    . MYOMECTOMY    . TOTAL KNEE ARTHROPLASTY Right 06/26/2018   Procedure: TOTAL KNEE ARTHROPLASTY;  Surgeon: Paralee Cancel, MD;  [facility];  Service: Orthopedics;  Laterality: Right;  70 mins    There were no vitals filed for this visit.  Subjective Assessment - 05/20/19 1024    Subjective  Knee is a little stiff, no pain just deep ache.    Pertinent History  Rt TKR last year    Currently in Pain?  Yes    Pain Score  2     Pain Location  Knee    Pain Orientation  Left    Pain Descriptors / Indicators  Aching    Multiple Pain Sites  No                       [facility] Adult PT  Treatment/Exercise - 05/20/19 0001      Knee/Hip Exercises: Aerobic   Recumbent Bike  L1 8 min with PTA discussing status.      Knee/Hip Exercises: Machines for Strengthening   Cybex Leg Press  Seat 8 75# Bil 2x10, LTLE 30# 2x10      Knee/Hip Exercises: Standing   Hip Flexion  Stengthening;Both;20 reps;Knee bent    Hip Flexion Limitations  2# added    Hip Abduction  Stengthening;Both;1 set;20 reps;Knee straight    Abduction Limitations  2# added    Lateral Step Up  Left;1 set;10 reps;Hand Hold: 1;Step Height: 6"    Forward Step Up  Left;1 set;10 reps;Step Height: 2";Step Height: 6"      Knee/Hip Exercises: Seated   Sit to Sand  1 set;10 reps;with UE support      Knee/Hip Exercises: Supine   Bridges  Strengthening;Both;1 set;10 reps    Bridges with Clamshell  --   clamshell only 10x green, then  added bridge to it 10x     Cryotherapy   Number Minutes Cryotherapy  10 Minutes    Cryotherapy Location  Knee    Type of Cryotherapy  Ice pack               PT Short Term Goals - 04/24/19 1120      PT SHORT TERM GOAL #1   Title  Pt will be compliant and ind with initial HEP and edema management strategies    Time  2    Period  Weeks    Status  Achieved    Target Date  04/15/19      PT SHORT TERM GOAL #2   Title  Pt will wean to single point cane for household and short community distances.    Time  4    Period  Weeks    Status  Achieved    Target Date  04/22/19      PT SHORT TERM GOAL #4   Title  Pt will be able to perform light household duties x 30 min without exacerbation of Lt knee pain.    Time  4    Period  Weeks    Status  Achieved    Target Date  04/29/19      PT SHORT TERM GOAL #5   Title  Pt will be ind with dressing Lt LE, with bathing (able to step over 14" tub ledge), and report greater ease with toilet transfers.    Time  4    Period  Weeks    Target Date  04/29/19        PT Long Term Goals - 05/17/19 1023      PT LONG TERM GOAL #1   Title   Pt will be ind in advanced HEP and understand how to safely progress.    Status  On-going      PT LONG TERM GOAL #2   Title  reduce FOTO to </= 33% to demo less limitation    Baseline  42% 05/17/19    Status  On-going      PT LONG TERM GOAL #3   Title  Pt will be able to perform community activities >/= 30 min without AD or with LRAD with symmetical gait pattern.    Status  Achieved      PT LONG TERM GOAL #4   Title  Pt will achieve strength in Lt LE (hip, knee and ankle) of at least 4+/5 to improve functional tasks such as gait endurance, stairs, transfers.    Status  On-going      PT LONG TERM GOAL #5   Title  Pt will ambulate >/= 500' with 6 min walk test using LRAD or without AD.    Status  Achieved            Plan - 05/20/19 1021    Clinical Impression Statement  Pt arrives with an achey knee today. She participated in mainly closed chain LTLE strengthening exercises and plans to ask her MD about returning to the pool this Wednesday. Pt remains with some end range extension weakness.    Personal Factors and Comorbidities  Time since onset of injury/illness/exacerbation    Examination-Activity Limitations  Transfers;Bend;Locomotion Level;Toileting;Stairs    Examination-Participation Restrictions  Driving;Community Activity;Cleaning;Meal Prep;Laundry    Stability/Clinical Decision Making  Stable/Uncomplicated    Rehab Potential  Good    PT Frequency  3x / week    PT Duration  8 weeks  PT Treatment/Interventions  ADLs/Self Care Home Management;Aquatic Therapy;Electrical Stimulation;Cryotherapy;DME Instruction;Gait training;Stair training;Functional mobility training;Therapeutic activities;Therapeutic exercise;Balance training;Neuromuscular re-education;Manual techniques;Patient/family education;Passive range of motion;Dry needling;Taping;Vasopneumatic Device;Joint Manipulations    PT Next Visit Plan  continue Lt hip abd and ER strength, end range knee strength, functional gait,  Lt SLS with vectors and UE support    PT Home Exercise Plan  Your Access [email or web address]    Consulted and Agree with Plan of Care  Patient       Patient will benefit from skilled therapeutic intervention in order to improve the following deficits and impairments:  Abnormal gait, Decreased range of motion, Difficulty walking, Decreased endurance, Pain, Decreased scar mobility, Impaired flexibility, Hypomobility, Decreased mobility, Decreased strength, Increased edema  Visit Diagnosis: Muscle weakness (generalized)  Stiffness of left knee, not elsewhere classified  Localized edema  Other abnormalities of gait and mobility  Left knee pain, unspecified chronicity     Problem List Patient Active Problem List   Diagnosis Date Noted  . Hypokalemia 06/27/2018  . Overweight (BMI 25.0-29.9) 06/27/2018  . S/P right TKA 06/26/2018  . Autoimmune disease ([place]) 03/15/2016  . High risk medication use 03/15/2016  . Pain in joint of right shoulder 03/15/2016  . Bilateral hip pain 03/15/2016  . Primary osteoarthritis of both knees 03/15/2016  . 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 Arthritis 02/24/2014  . HTN (hypertension) 02/24/2014  . Hyperlipidemia 02/24/2014  . Breast cancer, right, upper outer quadrant  07/05/2010    Yolanda Peters, PTA 05/20/2019, 1:13 PM  [facility] [address], [place], [place], [address] Phone: [phone]   Fax:  [phone]  Name: Yolanda Peters MRN: [id number] Date of Birth: [birth year]

## 2019-05-22 ENCOUNTER — Other Ambulatory Visit: Payer: Self-pay

## 2019-05-22 ENCOUNTER — Ambulatory Visit: Payer: Medicare Other | Admitting: Physical Therapy

## 2019-05-22 ENCOUNTER — Encounter: Payer: Self-pay | Admitting: Physical Therapy

## 2019-05-22 DIAGNOSIS — M25562 Pain in left knee: Secondary | ICD-10-CM

## 2019-05-22 DIAGNOSIS — M25662 Stiffness of left knee, not elsewhere classified: Secondary | ICD-10-CM

## 2019-05-22 DIAGNOSIS — R2689 Other abnormalities of gait and mobility: Secondary | ICD-10-CM

## 2019-05-22 DIAGNOSIS — M6281 Muscle weakness (generalized): Secondary | ICD-10-CM

## 2019-05-22 NOTE — Therapy (Signed)
[facility] [address] [place], [place], [address] Phone: [phone]   Fax:  [phone]  Physical Therapy Treatment  Patient Details  Name: Yolanda Peters MRN: [id number] Date of Birth: February 26, 1940 Referring Provider (PT): Dr. Rema Jasmine   Encounter Date: 05/22/2019  PT End of Session - 05/22/19 1447    Visit Number  22    Date for PT Re-Evaluation  05/27/19    Authorization Type  UHC Medicare    Progress Note Due on Visit  30    PT Start Time  1445    PT Stop Time  1525    PT Time Calculation (min)  40 min    Activity Tolerance  Patient tolerated treatment well    Behavior During Therapy  [facility] for tasks assessed/performed       Past Medical History:  Diagnosis Date  . Allergy   . Arthritis   . Breast cancer [facility]) 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  . Cancer ([place])    right breast   . Hyperlipidemia    takes Niacin  . Hypertension   . Personal history of radiation therapy 2011   rt breast    Past Surgical History:  Procedure Laterality Date  . BREAST DUCTAL SYSTEM EXCISION     right breast  . BREAST LUMPECTOMY    . BREAST SURGERY  august 2011   right partial mastectomy  . CATARACT EXTRACTION     bilateral  . CESAREAN SECTION    . ELBOW SURGERY     left  . JOINT REPLACEMENT    . KNEE ARTHROPLASTY    . MYOMECTOMY    . TOTAL KNEE ARTHROPLASTY Right 06/26/2018   Procedure: TOTAL KNEE ARTHROPLASTY;  Surgeon: Paralee Cancel, MD;  Location: [facility];  Service: Orthopedics;  Laterality: Right;  70 mins    There were no vitals filed for this visit.  Subjective Assessment - 05/22/19 1448    Subjective  Saw my doctor and we was super pleased. He thinks I am 80% there! I can get in the pool.    Pertinent History  Rt TKR last year    Limitations  House hold activities;Standing;Walking;Other (comment)    Currently in Pain?  No/denies    Multiple Pain Sites  No         [facility] PT Assessment - 05/22/19 0001       AROM   Left Knee Flexion  130                    [facility] Adult PT Treatment/Exercise - 05/22/19 0001      Knee/Hip Exercises: Aerobic   Recumbent Bike  L1 8 min with PTA discussing status.      Knee/Hip Exercises: Machines for Strengthening   Cybex Leg Press  Seat 8 75# Bil 2x10, LTLE 30# 2x10   weights still challenging     Knee/Hip Exercises: Standing   Hip Flexion  Stengthening;Both;20 reps;Knee bent    Hip Flexion Limitations  2# added and standing on black pad    Hip Abduction  Stengthening;Both;1 set;20 reps;Knee straight    Abduction Limitations  2# added and standing on black pad      Knee/Hip Exercises: Seated   Long Arc Quad  Strengthening;Left;2 sets;15 reps;Weights    Long Arc Quad Weight  4 lbs.   VC to hold 3-5 sec   Sit to Sand  2 sets;10 reps;with UE support   holding 5# wt  Knee/Hip Exercises: Supine   Bridges with Clamshell  Strengthening;15 reps   green band     Knee/Hip Exercises: Sidelying   Hip ABduction  Strengthening;Left;2 sets;10 reps               PT Short Term Goals - 04/24/19 1120      PT SHORT TERM GOAL #1   Title  Pt will be compliant and ind with initial HEP and edema management strategies    Time  2    Period  Weeks    Status  Achieved    Target Date  04/15/19      PT SHORT TERM GOAL #2   Title  Pt will wean to single point cane for household and short community distances.    Time  4    Period  Weeks    Status  Achieved    Target Date  04/22/19      PT SHORT TERM GOAL #4   Title  Pt will be able to perform light household duties x 30 min without exacerbation of Lt knee pain.    Time  4    Period  Weeks    Status  Achieved    Target Date  04/29/19      PT SHORT TERM GOAL #5   Title  Pt will be ind with dressing Lt LE, with bathing (able to step over 14" tub ledge), and report greater ease with toilet transfers.    Time  4    Period  Weeks    Target Date  04/29/19        PT Long Term Goals -  05/17/19 1023      PT LONG TERM GOAL #1   Title  Pt will be ind in advanced HEP and understand how to safely progress.    Status  On-going      PT LONG TERM GOAL #2   Title  reduce FOTO to </= 33% to demo less limitation    Baseline  42% 05/17/19    Status  On-going      PT LONG TERM GOAL #3   Title  Pt will be able to perform community activities >/= 30 min without AD or with LRAD with symmetical gait pattern.    Status  Achieved      PT LONG TERM GOAL #4   Title  Pt will achieve strength in Lt LE (hip, knee and ankle) of at least 4+/5 to improve functional tasks such as gait endurance, stairs, transfers.    Status  On-going      PT LONG TERM GOAL #5   Title  Pt will ambulate >/= 500' with 6 min walk test using LRAD or without AD.    Status  Achieved            Plan - 05/22/19 1447    Clinical Impression Statement  Pt saw her MD this AM who was super pleased with her progress. She will begin the pool ( if she can get an appt) next week and is ready to make the transition to pool and potentially DC PT next week. Pt achieved 130 degrees to knee flexion today.    Personal Factors and Comorbidities  Time since onset of injury/illness/exacerbation    Examination-Activity Limitations  Transfers;Bend;Locomotion Level;Toileting;Stairs    Examination-Participation Restrictions  Driving;Community Activity;Cleaning;Meal Prep;Laundry    Stability/Clinical Decision Making  Stable/Uncomplicated    Rehab Potential  Good    PT Frequency  3x / week  PT Duration  8 weeks    PT Treatment/Interventions  ADLs/Self Care Home Management;Aquatic Therapy;Electrical Stimulation;Cryotherapy;DME Instruction;Gait training;Stair training;Functional mobility training;Therapeutic activities;Therapeutic exercise;Balance training;Neuromuscular re-education;Manual techniques;Patient/family education;Passive range of motion;Dry needling;Taping;Vasopneumatic Device;Joint Manipulations    PT Next Visit Plan  Pt  will try to get in pool next week. She is ready to begin the transition from PT to the pool .    PT Home Exercise Plan  Your Access [email or web address]    Consulted and Agree with Plan of Care  Patient       Patient will benefit from skilled therapeutic intervention in order to improve the following deficits and impairments:  Abnormal gait, Decreased range of motion, Difficulty walking, Decreased endurance, Pain, Decreased scar mobility, Impaired flexibility, Hypomobility, Decreased mobility, Decreased strength, Increased edema  Visit Diagnosis: Stiffness of left knee, not elsewhere classified  Muscle weakness (generalized)  Other abnormalities of gait and mobility  Left knee pain, unspecified chronicity     Problem List Patient Active Problem List   Diagnosis Date Noted  . Hypokalemia 06/27/2018  . Overweight (BMI 25.0-29.9) 06/27/2018  . S/P right TKA 06/26/2018  . Autoimmune disease ([place]) 03/15/2016  . High risk medication use 03/15/2016  . Pain in joint of right shoulder 03/15/2016  . Bilateral hip pain 03/15/2016  . Primary osteoarthritis of both knees 03/15/2016  . 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 Arthritis 02/24/2014  . HTN (hypertension) 02/24/2014  . Hyperlipidemia 02/24/2014  . Breast cancer, right, upper outer quadrant  07/05/2010    Hilda Rynders, PTA 05/22/2019, 3:24 PM  [facility] [address] [place], [place], [address] Phone: [phone]   Fax:  [phone]  Name: Yolanda Peters MRN: [id number] Date of Birth: [birth year]

## 2019-05-24 ENCOUNTER — Encounter: Payer: Self-pay | Admitting: Physical Therapy

## 2019-05-24 ENCOUNTER — Other Ambulatory Visit: Payer: Self-pay

## 2019-05-24 ENCOUNTER — Ambulatory Visit: Payer: Medicare Other | Admitting: Physical Therapy

## 2019-05-24 DIAGNOSIS — R600 Localized edema: Secondary | ICD-10-CM

## 2019-05-24 DIAGNOSIS — M25662 Stiffness of left knee, not elsewhere classified: Secondary | ICD-10-CM

## 2019-05-24 DIAGNOSIS — M25562 Pain in left knee: Secondary | ICD-10-CM

## 2019-05-24 DIAGNOSIS — R2689 Other abnormalities of gait and mobility: Secondary | ICD-10-CM

## 2019-05-24 DIAGNOSIS — M6281 Muscle weakness (generalized): Secondary | ICD-10-CM

## 2019-05-24 NOTE — Therapy (Signed)
[facility] [address], [place], [place], [address] Phone: [phone]   Fax:  [phone]  Physical Therapy Treatment  Patient Details  Name: Yolanda Peters MRN: [id number] Date of Birth: November 10, 1940 Referring Provider (PT): Dr. Rema Jasmine   Encounter Date: 05/24/2019  PT End of Session - 05/24/19 1018    Visit Number  23    Date for PT Re-Evaluation  05/27/19    Authorization Type  [employer] Medicare    Progress Note Due on Visit  30    PT Start Time  1016    PT Stop Time  1055    PT Time Calculation (min)  39 min    Activity Tolerance  Patient tolerated treatment well    Behavior During Therapy  [facility] for tasks assessed/performed       Past Medical History:  Diagnosis Date  . Allergy   . Arthritis   . Breast cancer [facility]) 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  . Cancer ([place])    right breast   . Hyperlipidemia    takes Niacin  . Hypertension   . Personal history of radiation therapy 2011   rt breast    Past Surgical History:  Procedure Laterality Date  . BREAST DUCTAL SYSTEM EXCISION     right breast  . BREAST LUMPECTOMY    . BREAST SURGERY  august 2011   right partial mastectomy  . CATARACT EXTRACTION     bilateral  . CESAREAN SECTION    . ELBOW SURGERY     left  . JOINT REPLACEMENT    . KNEE ARTHROPLASTY    . MYOMECTOMY    . TOTAL KNEE ARTHROPLASTY Right 06/26/2018   Procedure: TOTAL KNEE ARTHROPLASTY;  Surgeon: Paralee Cancel, MD;  Location: [facility];  Service: Orthopedics;  Laterality: Right;  70 mins    There were no vitals filed for this visit.  Subjective Assessment - 05/24/19 1020    Subjective  I am scheduled to go to the pool Monday afternoon.    Pertinent History  Rt TKR last year    Currently in Pain?  No/denies    Multiple Pain Sites  No                        [facility] Adult PT Treatment/Exercise - 05/24/19 0001      Knee/Hip Exercises: Aerobic   Recumbent Bike  L1 8 min with  PTA discussing status.      Knee/Hip Exercises: Machines for Strengthening   Cybex Leg Press  Seat 8 75# Bil 2x10, LTLE 30# 2x10   weights still challenging     Knee/Hip Exercises: Standing   Knee Flexion  Strengthening;Both;1 set;20 reps    Knee Flexion Limitations  2#    Hip Flexion  Stengthening;Both;2 sets;20 reps;Knee bent    Hip Flexion Limitations  2# added and standing on black pad    Hip Abduction  Stengthening;Both;1 set;20 reps;Knee straight    Abduction Limitations  2# added and standing on black pad      Knee/Hip Exercises: Seated   Long Arc Quad  Strengthening;Left;2 sets;15 reps;Weights    Long Arc Quad Weight  4 lbs.   VC to hold 3-5 sec              PT Short Term Goals - 04/24/19 1120      PT SHORT TERM GOAL #1   Title  Pt will be compliant and ind with  initial HEP and edema management strategies    Time  2    Period  Weeks    Status  Achieved    Target Date  04/15/19      PT SHORT TERM GOAL #2   Title  Pt will wean to single point cane for household and short community distances.    Time  4    Period  Weeks    Status  Achieved    Target Date  04/22/19      PT SHORT TERM GOAL #4   Title  Pt will be able to perform light household duties x 30 min without exacerbation of Lt knee pain.    Time  4    Period  Weeks    Status  Achieved    Target Date  04/29/19      PT SHORT TERM GOAL #5   Title  Pt will be ind with dressing Lt LE, with bathing (able to step over 14" tub ledge), and report greater ease with toilet transfers.    Time  4    Period  Weeks    Target Date  04/29/19        PT Long Term Goals - 05/17/19 1023      PT LONG TERM GOAL #1   Title  Pt will be ind in advanced HEP and understand how to safely progress.    Status  On-going      PT LONG TERM GOAL #2   Title  reduce FOTO to </= 33% to demo less limitation    Baseline  42% 05/17/19    Status  On-going      PT LONG TERM GOAL #3   Title  Pt will be able to perform community  activities >/= 30 min without AD or with LRAD with symmetical gait pattern.    Status  Achieved      PT LONG TERM GOAL #4   Title  Pt will achieve strength in Lt LE (hip, knee and ankle) of at least 4+/5 to improve functional tasks such as gait endurance, stairs, transfers.    Status  On-going      PT LONG TERM GOAL #5   Title  Pt will ambulate >/= 500' with 6 min walk test using LRAD or without AD.    Status  Achieved            Plan - 05/24/19 1019    Clinical Impression Statement  Pt has had consecutive days where sleeping was not interruppted by pain and in general pain has been low. Pt will return to her water exercises this Monday afternoon.    Personal Factors and Comorbidities  Time since onset of injury/illness/exacerbation    Examination-Activity Limitations  Transfers;Bend;Locomotion Level;Toileting;Stairs    Examination-Participation Restrictions  Driving;Community Activity;Cleaning;Meal Prep;Laundry    Stability/Clinical Decision Making  Stable/Uncomplicated    Rehab Potential  Good    PT Frequency  3x / week    PT Duration  8 weeks    PT Treatment/Interventions  ADLs/Self Care Home Management;Aquatic Therapy;Electrical Stimulation;Cryotherapy;DME Instruction;Gait training;Stair training;Functional mobility training;Therapeutic activities;Therapeutic exercise;Balance training;Neuromuscular re-education;Manual techniques;Patient/family education;Passive range of motion;Dry needling;Taping;Vasopneumatic Device;Joint Manipulations    PT Next Visit Plan  Pt will try to get in pool next week. She is ready to begin the transition from PT to the pool . pt is thinking of discharge end of next week.    PT Home Exercise Plan  Your Access [email or web address]    Consulted and Agree with  Plan of Care  Patient       Patient will benefit from skilled therapeutic intervention in order to improve the following deficits and impairments:  Abnormal gait, Decreased range of motion, Difficulty  walking, Decreased endurance, Pain, Decreased scar mobility, Impaired flexibility, Hypomobility, Decreased mobility, Decreased strength, Increased edema  Visit Diagnosis: Stiffness of left knee, not elsewhere classified  Muscle weakness (generalized)  Other abnormalities of gait and mobility  Left knee pain, unspecified chronicity  Localized edema     Problem List Patient Active Problem List   Diagnosis Date Noted  . Hypokalemia 06/27/2018  . Overweight (BMI 25.0-29.9) 06/27/2018  . S/P right TKA 06/26/2018  . Autoimmune disease ([place]) 03/15/2016  . High risk medication use 03/15/2016  . Pain in joint of right shoulder 03/15/2016  . Bilateral hip pain 03/15/2016  . Primary osteoarthritis of both knees 03/15/2016  . 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 Arthritis 02/24/2014  . HTN (hypertension) 02/24/2014  . Hyperlipidemia 02/24/2014  . Breast cancer, right, upper outer quadrant  07/05/2010    COCHRAN,JENNIFER, PTA 05/24/2019, 10:54 AM  [facility] [address] [place], [place], [address] Phone: [phone]   Fax:  [phone]  Name: Yolanda Peters MRN: [id number] Date of Birth: Oct 14, 1940

## 2019-05-27 ENCOUNTER — Ambulatory Visit: Payer: Medicare Other | Admitting: Physical Therapy

## 2019-05-27 ENCOUNTER — Encounter: Payer: Self-pay | Admitting: Physical Therapy

## 2019-05-27 ENCOUNTER — Other Ambulatory Visit: Payer: Self-pay

## 2019-05-27 DIAGNOSIS — M6281 Muscle weakness (generalized): Secondary | ICD-10-CM

## 2019-05-27 DIAGNOSIS — M25662 Stiffness of left knee, not elsewhere classified: Secondary | ICD-10-CM

## 2019-05-27 DIAGNOSIS — M25562 Pain in left knee: Secondary | ICD-10-CM

## 2019-05-27 DIAGNOSIS — R2689 Other abnormalities of gait and mobility: Secondary | ICD-10-CM

## 2019-05-27 NOTE — Therapy (Signed)
[facility] [address] [place], [place], [address] Phone: [phone]   Fax:  [phone]  Physical Therapy Treatment  Patient Details  Name: Yolanda Peters MRN: [id number] Date of Birth: Jun 22, 1940 Referring Provider (PT): Dr. Rema Jasmine   Encounter Date: 05/27/2019  PT End of Session - 05/27/19 1153    Visit Number  24    Date for PT Re-Evaluation  05/27/19    Authorization Type  [employer]HC Medicare    Progress Note Due on Visit  30    PT Start Time  1145    PT Stop Time  1229    PT Time Calculation (min)  44 min    Activity Tolerance  Patient tolerated treatment well    Behavior During Therapy  [facility] for tasks assessed/performed       Past Medical History:  Diagnosis Date  . Allergy   . Arthritis   . Breast cancer [facility]) 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  . Cancer ([place])    right breast   . Hyperlipidemia    takes Niacin  . Hypertension   . Personal history of radiation therapy 2011   rt breast    Past Surgical History:  Procedure Laterality Date  . BREAST DUCTAL SYSTEM EXCISION     right breast  . BREAST LUMPECTOMY    . BREAST SURGERY  august 2011   right partial mastectomy  . CATARACT EXTRACTION     bilateral  . CESAREAN SECTION    . ELBOW SURGERY     left  . JOINT REPLACEMENT    . KNEE ARTHROPLASTY    . MYOMECTOMY    . TOTAL KNEE ARTHROPLASTY Right 06/26/2018   Procedure: TOTAL KNEE ARTHROPLASTY;  Surgeon: Paralee Cancel, MD;  Location: [facility];  Service: Orthopedics;  Laterality: Right;  70 mins    There were no vitals filed for this visit.  Subjective Assessment - 05/27/19 1150    Subjective  I am a little stiff in Lt knee.  I have signed up for the pool 2x this week.    Pertinent History  Rt TKR last year    Limitations  House hold activities;Standing;Walking;Other (comment)    How long can you stand comfortably?  20+    How long can you walk comfortably?  8' but pain later    Patient Stated  Goals  go down 5 back steps with 2 rails to access backyard, be able to drive by early April    Currently in Pain?  No/denies    Pain Onset  More than a month ago         [facility] PT Assessment - 05/27/19 0001      Assessment   Referring Provider (PT)  Dr. Rema Jasmine    Onset Date/Surgical Date  03/28/19    Hand Dominance  Right    Next MD Visit  04/10/19    Prior Therapy  yes at this facility for other knee      Observation/Other Assessments   Focus on Therapeutic Outcomes (FOTO)   42%      ROM / Strength   AROM / PROM / Strength  Strength;AROM      AROM   Right/Left Knee  Left    Left Knee Extension  -2    Left Knee Flexion  130      Strength   Right/Left Hip  Left    Left Hip Flexion  4+/5    Left Hip  Extension  4+/5    Left Hip External Rotation  5/5    Left Hip Internal Rotation  5/5    Left Hip ABduction  5/5    Left Hip ADduction  5/5    Left Knee Flexion  4+/5    Left Knee Extension  5/5                    [facility] Adult PT Treatment/Exercise - 05/27/19 0001      Exercises   Exercises  Knee/Hip   reviewed ideas for deep water scissoring A/P and M/L     Knee/Hip Exercises: Stretches   Active Hamstring Stretch  Left;1 rep;30 seconds    Active Hamstring Stretch Limitations  foot on 2nd step    Other Knee/Hip Stretches  gastroc stretch Lt 1x30 sec against wall    Other Knee/Hip Stretches  knee AA/ROM foot on 2nd step lunge style flexion x 15 reps      Knee/Hip Exercises: Aerobic   Recumbent Bike  L2 x 8', PT present to review LTGs      Knee/Hip Exercises: Standing   Knee Flexion  Strengthening;Left;10 reps    Hip Flexion  Stengthening;Left;10 reps;Knee straight    Hip Abduction  Stengthening;Left;10 reps;Knee straight    Hip Extension  Stengthening;Left      Manual Therapy   Soft tissue mobilization  Lt scar mobs inferior aspect, medial gastroc and hamstring on Lt    Passive ROM  Lt knee flexion and extension 10 reps                PT Short Term Goals - 04/24/19 1120      PT SHORT TERM GOAL #1   Title  Pt will be compliant and ind with initial HEP and edema management strategies    Time  2    Period  Weeks    Status  Achieved    Target Date  04/15/19      PT SHORT TERM GOAL #2   Title  Pt will wean to single point cane for household and short community distances.    Time  4    Period  Weeks    Status  Achieved    Target Date  04/22/19      PT SHORT TERM GOAL #4   Title  Pt will be able to perform light household duties x 30 min without exacerbation of Lt knee pain.    Time  4    Period  Weeks    Status  Achieved    Target Date  04/29/19      PT SHORT TERM GOAL #5   Title  Pt will be ind with dressing Lt LE, with bathing (able to step over 14" tub ledge), and report greater ease with toilet transfers.    Time  4    Period  Weeks    Target Date  04/29/19        PT Long Term Goals - 05/27/19 1152      PT LONG TERM GOAL #1   Title  Pt will be ind in advanced HEP and understand how to safely progress.    Baseline  adding pool 2x/week to HEP    Status  Achieved      PT LONG TERM GOAL #2   Title  reduce FOTO to </= 33% to demo less limitation    Baseline  42% 05/17/19    Status  Partially Met      PT  LONG TERM GOAL #3   Title  Pt will be able to perform community activities >/= 30 min without AD or with LRAD with symmetical gait pattern.    Status  Achieved      PT LONG TERM GOAL #4   Title  Pt will achieve strength in Lt LE (hip, knee and ankle) of at least 4+/5 to improve functional tasks such as gait endurance, stairs, transfers.    Status  Achieved      PT LONG TERM GOAL #5   Title  Pt will ambulate >/= 500' with 6 min walk test using LRAD or without AD.    Status  Achieved            Plan - 05/27/19 1229    Clinical Impression Statement  Pt has been cleared by her MD to return to pool based ther ex which she plans to do 2x/week starting this week.  She has slight  remaining deficits in hip and knee strength along posterior chain in gluts and hamstrings which she will continue to target through her HEP.  End range knee ext is much improved and 5/5.  Knee flexion is 130 and knee ext is -2.  PT notes gastroc and medial hamstring restrictions for which stretches were reviewed today.  PT had Pt run through water simulation hip and knee strength exercises to target areas needing further strength.  Pt reports she is able to do everything she wants to do at this point post-op.  She is ready to d/c to HEP with continued committent to exercise 4 days a week.    PT Frequency  3x / week    PT Duration  8 weeks    PT Treatment/Interventions  ADLs/Self Care Home Management;Aquatic Therapy;Electrical Stimulation;Cryotherapy;DME Instruction;Gait training;Stair training;Functional mobility training;Therapeutic activities;Therapeutic exercise;Balance training;Neuromuscular re-education;Manual techniques;Patient/family education;Passive range of motion;Dry needling;Taping;Vasopneumatic Device;Joint Manipulations    PT Next Visit Plan  d/c to HEP    PT Home Exercise Plan  Your Access [email or web address], Pt adding pool 2x/week    Consulted and Agree with Plan of Care  Patient       Patient will benefit from skilled therapeutic intervention in order to improve the following deficits and impairments:     Visit Diagnosis: Stiffness of left knee, not elsewhere classified  Muscle weakness (generalized)  Other abnormalities of gait and mobility  Left knee pain, unspecified chronicity     Problem List Patient Active Problem List   Diagnosis Date Noted  . Hypokalemia 06/27/2018  . Overweight (BMI 25.0-29.9) 06/27/2018  . S/P right TKA 06/26/2018  . Autoimmune disease ([place]) 03/15/2016  . High risk medication use 03/15/2016  . Pain in joint of right shoulder 03/15/2016  . Bilateral hip pain 03/15/2016  . Primary osteoarthritis of both knees 03/15/2016  . Fat necrosis of female  breast 11/25/2014  . Arthritis 02/24/2014  . HTN (hypertension) 02/24/2014  . Hyperlipidemia 02/24/2014  . Breast cancer, right, upper outer quadrant  07/05/2010    PHYSICAL THERAPY DISCHARGE SUMMARY  Visits from Start of Care: 24  Current functional level related to goals / functional outcomes: See above   Remaining deficits: See above   Education / Equipment: HEP + pool exercise Plan: Patient agrees to discharge.  Patient goals were met. Patient is being discharged due to meeting the stated rehab goals.  ?????         Baruch Merl, PT 05/27/19 12:37 PM   [facility] [address]  [place], [place], [address] Phone: [phone]   Fax:  [phone]  Name: Yolanda Peters MRN: [id number] Date of Birth: 03/22/1940

## 2019-05-29 ENCOUNTER — Ambulatory Visit: Payer: Medicare Other | Admitting: Physical Therapy

## 2019-05-31 ENCOUNTER — Ambulatory Visit: Payer: Medicare Other | Admitting: Physical Therapy

## 2019-06-03 ENCOUNTER — Encounter: Payer: Medicare Other | Admitting: Physical Therapy

## 2019-06-05 ENCOUNTER — Encounter: Payer: Medicare Other | Admitting: Physical Therapy

## 2019-06-07 ENCOUNTER — Encounter: Payer: Medicare Other | Admitting: Physical Therapy

## 2019-06-11 ENCOUNTER — Other Ambulatory Visit (HOSPITAL_COMMUNITY): Payer: Self-pay | Admitting: Nurse Practitioner

## 2019-06-11 ENCOUNTER — Ambulatory Visit (HOSPITAL_COMMUNITY)
Admission: RE | Admit: 2019-06-11 | Discharge: 2019-06-11 | Disposition: A | Discharge status: Home / Self Care | Payer: Medicare Other | Source: Ambulatory Visit | Attending: Nurse Practitioner | Admitting: Nurse Practitioner

## 2019-06-11 ENCOUNTER — Other Ambulatory Visit: Payer: Self-pay

## 2019-06-11 DIAGNOSIS — M7989 Other specified soft tissue disorders: Secondary | ICD-10-CM | POA: Diagnosis not present

## 2019-06-11 DIAGNOSIS — M79605 Pain in left leg: Secondary | ICD-10-CM

## 2019-06-11 NOTE — Progress Notes (Signed)
Lower extremity venous has been completed.   Preliminary results in CV Proc.   Abram Sander 06/11/2019 3:59 PM

## 2019-06-25 ENCOUNTER — Encounter: Payer: Self-pay | Admitting: Internal Medicine

## 2019-08-19 ENCOUNTER — Other Ambulatory Visit: Payer: Self-pay

## 2019-08-19 ENCOUNTER — Ambulatory Visit (AMBULATORY_SURGERY_CENTER): Payer: Self-pay | Admitting: *Deleted

## 2019-08-19 VITALS — Ht 65.0 in | Wt 157.4 lb

## 2019-08-19 DIAGNOSIS — Z86010 Personal history of colonic polyps: Secondary | ICD-10-CM

## 2019-08-19 MED ORDER — SUTAB 1479-225-188 MG PO TABS: 1.0000 | ORAL_TABLET | Freq: Once | ORAL | 0 refills | Status: AC

## 2019-08-19 NOTE — Progress Notes (Signed)
Completed covid vaccines 02-11-19  Pt is aware that care partner will wait in the car during procedure; if they feel like they will be too hot or cold to wait in the car; they may wait in the 4 th floor lobby. Patient is aware to bring only one care partner. We want them to wear a mask (we do not have any that we can provide them), practice social distancing, and we will check their temperatures when they get here.  I did remind the patient that their care partner needs to stay in the parking lot the entire time and have a cell phone available, we will call them when the pt is ready for discharge. Patient will wear mask into building.   No trouble with anesthesia, difficulty with moving neck or hx/fam hx of malignant hyperthermia per pt   No egg or soy allergy  No home oxygen use   No medications for weight loss taken  emmi information given  Pt denies constipation issues   Sutab code put into RX and paper copy given to pt to show pharmacy

## 2019-09-02 ENCOUNTER — Ambulatory Visit (AMBULATORY_SURGERY_CENTER): Payer: Medicare Other | Admitting: Internal Medicine

## 2019-09-02 ENCOUNTER — Encounter: Payer: Self-pay | Admitting: Internal Medicine

## 2019-09-02 ENCOUNTER — Other Ambulatory Visit: Payer: Self-pay

## 2019-09-02 VITALS — BP 143/74 | HR 67 | Temp 96.6°F | Resp 13 | Ht 65.0 in | Wt 157.4 lb

## 2019-09-02 DIAGNOSIS — D122 Benign neoplasm of ascending colon: Secondary | ICD-10-CM

## 2019-09-02 DIAGNOSIS — Z86010 Personal history of colonic polyps: Secondary | ICD-10-CM | POA: Diagnosis not present

## 2019-09-02 DIAGNOSIS — D125 Benign neoplasm of sigmoid colon: Secondary | ICD-10-CM

## 2019-09-02 MED ORDER — SODIUM CHLORIDE 0.9 % IV SOLN: 500.0000 mL | Freq: Once | INTRAVENOUS | Status: DC

## 2019-09-02 NOTE — Progress Notes (Signed)
VS by CW  Pt's states no medical or surgical changes since previsit or office visit.  

## 2019-09-02 NOTE — Op Note (Signed)
[place] Patient Name: Yolanda Peters Procedure Date: 09/02/2019 10:18 AM MRN: [id number] Endoscopist: Docia Chuck. Henrene Pastor , MD Age: 79 Referring MD:  Date of Birth: [birth year] Gender: Female Account #: [id number] Procedure:                Colonoscopy with cold snare polypectomy x 2 Indications:              High risk colon cancer surveillance: Personal                            history of multiple (3 or more) adenomas. Previous                            examinations 2006, 2010, 2016 Medicines:                Monitored Anesthesia Care Procedure:                Pre-Anesthesia Assessment:                           - Prior to the procedure, a History and Physical                            was performed, and patient medications and                            allergies were reviewed. The patient's tolerance of                            previous anesthesia was also reviewed. The risks                            and benefits of the procedure and the sedation                            options and risks were discussed with the patient.                            All questions were answered, and informed consent                            was obtained. Prior Anticoagulants: The patient has                            taken no previous anticoagulant or antiplatelet                            agents. ASA Grade Assessment: II - A patient with                            mild systemic disease. After reviewing the risks                            and benefits, the patient was deemed in  satisfactory condition to undergo the procedure.                           After obtaining informed consent, the colonoscope                            was passed under direct vision. Throughout the                            procedure, the patient's blood pressure, pulse, and                            oxygen saturations were monitored continuously. The                             Colonoscope was introduced through the anus and                            advanced to the the cecum, identified by                            appendiceal orifice and ileocecal valve. The                            ileocecal valve, appendiceal orifice, and rectum                            were photographed. The quality of the bowel                            preparation was good. The colonoscopy was performed                            without difficulty. The patient tolerated the                            procedure well. The bowel preparation used was                            SUPREP via split dose instruction. Scope In: 10:30:10 AM Scope Out: 10:48:08 AM Scope Withdrawal Time: 0 hours 13 minutes 25 seconds  Total Procedure Duration: 0 hours 17 minutes 58 seconds  Findings:                 Two polyps were found in the sigmoid colon and                            ascending colon. The polyps were 1 to 2 mm in size.                            These polyps were removed with a cold snare.                            Resection and retrieval were complete.  Multiple diverticula were found in the sigmoid                            colon.                           Internal hemorrhoids were found during retroflexion.                           The exam was otherwise without abnormality on                            direct and retroflexion views. Complications:            No immediate complications. Estimated blood loss:                            None. Estimated Blood Loss:     Estimated blood loss: none. Impression:               - Two 1 to 2 mm polyps in the sigmoid colon and in                            the ascending colon, removed with a cold snare.                            Resected and retrieved.                           - Diverticulosis in the sigmoid colon.                           - Internal hemorrhoids.                           - The examination was  otherwise normal on direct                            and retroflexion views. Recommendation:           - Repeat colonoscopy is not recommended for                            surveillance.                           - Patient has a contact number available for                            emergencies. The signs and symptoms of potential                            delayed complications were discussed with the                            patient. Return to normal activities tomorrow.  Written discharge instructions were provided to the                            patient.                           - Resume previous diet.                           - Continue present medications.                           - Await pathology results. Docia Chuck. Henrene Pastor, MD 09/02/2019 10:54:08 AM This report has been signed electronically.

## 2019-09-02 NOTE — Progress Notes (Signed)
Office Visit Note  Patient: Yolanda Peters             Date of Birth: 1940-11-06           MRN: [id number]             PCP: Shon Baton, MD Referring: Shon Baton, MD Visit Date: 09/12/2019 Occupation: @GUAROCC @  Subjective:  Joint pain.   History of Present Illness: Yolanda Peters is a 79 y.o. female with history of autoimmune disease and osteoarthritis.  Her autoimmune disease has been in remission and she has been off Plaquenil.  She denies any history of oral ulcers, nasal ulcers, malar rash, photosensitivity, sicca symptoms, Raynaud's phenomenon or joint swelling.  She states she gets some pedal edema on her lower extremities towards the end of the day.  Activities of Daily Living:  Patient reports morning stiffness for 0 minutes.   Patient Denies nocturnal pain.  Difficulty dressing/grooming: Denies Difficulty climbing stairs: Denies Difficulty getting out of chair: Denies Difficulty using hands for taps, buttons, cutlery, and/or writing: Reports  Review of Systems  Constitutional: Negative for fatigue, night sweats, weight gain and weight loss.  HENT: Negative for mouth sores, trouble swallowing, trouble swallowing, mouth dryness and nose dryness.   Eyes: Negative for pain, redness, itching, visual disturbance and dryness.  Respiratory: Negative for cough, shortness of breath and difficulty breathing.   Cardiovascular: Positive for swelling in legs/feet. Negative for chest pain, palpitations, hypertension and irregular heartbeat.  Gastrointestinal: Negative for blood in stool, constipation and diarrhea.  Endocrine: Negative for increased urination.  Genitourinary: Negative for difficulty urinating and vaginal dryness.  Musculoskeletal: Negative for arthralgias, joint pain, joint swelling, myalgias, muscle weakness, morning stiffness, muscle tenderness and myalgias.  Skin: Negative for color change, rash, hair loss, redness, skin tightness, ulcers and sensitivity to  sunlight.  Allergic/Immunologic: Negative for susceptible to infections.  Neurological: Negative for dizziness, numbness, headaches, memory loss, night sweats and weakness.  Hematological: Positive for bruising/bleeding tendency. Negative for swollen glands.  Psychiatric/Behavioral: Negative for depressed mood, confusion and sleep disturbance. The patient is not nervous/anxious.     PMFS History:  Patient Active Problem List   Diagnosis Date Noted  . Hypokalemia 06/27/2018  . Overweight (BMI 25.0-29.9) 06/27/2018  . S/P right TKA 06/26/2018  . Autoimmune disease ([place]) 03/15/2016  . High risk medication use 03/15/2016  . Pain in joint of right shoulder 03/15/2016  . Bilateral hip pain 03/15/2016  . Primary osteoarthritis of both knees 03/15/2016  . 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 Arthritis 02/24/2014  . HTN (hypertension) 02/24/2014  . Hyperlipidemia 02/24/2014  . Breast cancer, right, upper outer quadrant  07/05/2010    Past Medical History:  Diagnosis Date  . Allergy   . Arthritis   . Breast cancer ([place]) 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, skin cnacer  . Cancer ([place])    right breast   . Hyperlipidemia    no meds taken  . Hypertension   . Personal history of radiation therapy 2011   rt breast    Family History  Problem Relation Age of Onset  . Cancer Mother        bladder  . Cancer Father        leukemia  . Cancer Maternal Aunt        breast  . Colon cancer Paternal Aunt        pt is unsure of this  . Colon cancer Cousin  pt is unsure of this  . Esophageal cancer Neg Hx   . Rectal cancer Neg Hx   . Stomach cancer Neg Hx    Past Surgical History:  Procedure Laterality Date  . BREAST DUCTAL SYSTEM EXCISION     right breast  . BREAST LUMPECTOMY    . CATARACT EXTRACTION     bilateral  . CESAREAN SECTION    . COLONOSCOPY    . ELBOW SURGERY     left  . JOINT REPLACEMENT    . KNEE ARTHROPLASTY Left    March 2021  . MYOMECTOMY    . REPLACEMENT TOTAL  KNEE Left 03/2019  . TOTAL KNEE ARTHROPLASTY Right 06/26/2018   Procedure: TOTAL KNEE ARTHROPLASTY;  Surgeon: Paralee Cancel, MD;  Location: [facility];  Service: Orthopedics;  Laterality: Right;  70 mins   Social History   Social History Narrative  . Not on file   Immunization History  Administered Date(s) Administered  . Influenza Split 09/24/2013, 09/24/2014  . PFIZER SARS-COV-2 Vaccination 01/22/2019, 02/11/2019  . Tdap 08/28/2014  . Zoster Recombinat (Shingrix) 10/12/2017     Objective: Vital Signs: BP 135/84 (BP Location: Left Arm, Patient Position: Sitting, Cuff Size: Normal)   Pulse 86   Resp 13   Ht 5\' 5"  (1.651 m)   Wt 159 lb 6.4 oz (72.3 kg)   BMI 26.53 kg/m    Physical Exam Vitals and nursing note reviewed.  Constitutional:      Appearance: She is well-developed.  HENT:     Head: Normocephalic and atraumatic.  Eyes:     Conjunctiva/sclera: Conjunctivae normal.  Cardiovascular:     Rate and Rhythm: Normal rate and regular rhythm.     Heart sounds: Normal heart sounds.  Pulmonary:     Effort: Pulmonary effort is normal.     Breath sounds: Normal breath sounds.  Abdominal:     General: Bowel sounds are normal.     Palpations: Abdomen is soft.  Musculoskeletal:     Cervical back: Normal range of motion.  Lymphadenopathy:     Cervical: No cervical adenopathy.  Skin:    General: Skin is warm and dry.     Capillary Refill: Capillary refill takes less than 2 seconds.  Neurological:     Mental Status: She is alert and oriented to person, place, and time.  Psychiatric:        Behavior: Behavior normal.      Musculoskeletal Exam: C-spine was in good range of motion.  She has some thoracic kyphosis.  Shoulder joints, elbow joints with good range of motion.  She has severe osteoarthritis with PIP and DIP subluxation.  Hip joints with good range of motion.  Bilateral knee joints are replaced.  She had no tenderness across MTPs.  CDAI Exam: CDAI Score: -- Patient  Global: --; Provider Global: -- Swollen: --; Tender: -- Joint Exam 09/12/2019   No joint exam has been documented for this visit   There is currently no information documented on the homunculus. Go to the Rheumatology activity and complete the homunculus joint exam.  Investigation: No additional findings.  Imaging: No results found.  Recent Labs: Lab Results  Component Value Date   WBC 11.4 (H) 06/27/2018   HGB 9.9 (L) 06/27/2018   PLT 233 06/27/2018   NA 139 06/27/2018   K 3.0 (L) 06/27/2018   CL 101 06/27/2018   CO2 25 06/27/2018   GLUCOSE 154 (H) 06/27/2018   BUN 24 (H) 06/27/2018   CREATININE 0.82  06/27/2018   BILITOT 0.9 01/22/2018   ALKPHOS 87 08/17/2016   AST 17 01/22/2018   ALT 18 01/22/2018   PROT 6.0 (L) 01/22/2018   ALBUMIN 4.2 08/17/2016   CALCIUM 7.7 (L) 06/27/2018   GFRAA >60 06/27/2018    Speciality Comments: [facility]  Procedures:  No procedures performed Allergies: Penicillins and Tetracyclines & related   Assessment / Plan:     Visit Diagnoses: Autoimmune disease ([place]) - in remission.  She is clinically doing very well and had no clinical features of autoimmune disease.  We decided not to do any labs.  Have advised her to contact me in case she develops any new symptoms.  High risk medication use - she has been off Plaquenil  Primary osteoarthritis of both hands-she has severe osteoarthritis with DIP and PIP subluxation of bilateral hands.  Joint protection muscle strengthening was discussed.  Status post total bilateral knee replacement -she had both knees replaced and has been doing well.  Primary osteoarthritis of both feet-proper fitting shoes were discussed.  History of breast cancer  History of hyperlipidemia  History of hypertension-her blood pressure is well controlled.  Osteoporosis screening - Patient will get her DEXA scan with Dr. Milta Deiters her GYN.  Patient had both  COVID-19 vaccines.  Have advised her to get a booster when available.  We also discussed that she will be a candidate for monoclonal antibody in case she develops COVID-19 infection.  Use of mask, social distancing and hand hygiene was emphasized.  Orders: No orders of the defined types were placed in this encounter.  No orders of the defined types were placed in this encounter.     Follow-Up Instructions: Return in about 1 year (around 09/11/2020) for Autoimmune disease.   Bo Merino, MD  Note - This record has been created using Editor, commissioning.  Chart creation errors have been sought, but may not always  have been located. Such creation errors do not reflect on  the standard of medical care.

## 2019-09-02 NOTE — Patient Instructions (Signed)
Impression/Recommendations:  Polyp handout given to patient. Diverticulosis handout given to patient. Hemorrhoid handout given to patient.  Repeat colonoscopy not recommended for surveillance.  Resume previous diet. Continue present medications. Await pathology results.  YOU HAD AN ENDOSCOPIC PROCEDURE TODAY AT [facility]:   Refer to the procedure report that was given to you for any specific questions about what was found during the examination.  If the procedure report does not answer your questions, please call your gastroenterologist to clarify.  If you requested that your care partner not be given the details of your procedure findings, then the procedure report has been included in a sealed envelope for you to review at your convenience later.  YOU SHOULD EXPECT: Some feelings of bloating in the abdomen. Passage of more gas than usual.  Walking can help get rid of the air that was put into your GI tract during the procedure and reduce the bloating. If you had a lower endoscopy (such as a colonoscopy or flexible sigmoidoscopy) you may notice spotting of blood in your stool or on the toilet paper. If you underwent a bowel prep for your procedure, you may not have a normal bowel movement for a few days.  Please Note:  You might notice some irritation and congestion in your nose or some drainage.  This is from the oxygen used during your procedure.  There is no need for concern and it should clear up in a day or so.  SYMPTOMS TO REPORT IMMEDIATELY:   Following lower endoscopy (colonoscopy or flexible sigmoidoscopy):  Excessive amounts of blood in the stool  Significant tenderness or worsening of abdominal pains  Swelling of the abdomen that is new, acute  Fever of 100F or higher  For urgent or emergent issues, a gastroenterologist can be reached at any hour by calling [phone]. Do not use MyChart messaging for urgent concerns.    DIET:  We do recommend a  small meal at first, but then you may proceed to your regular diet.  Drink plenty of fluids but you should avoid alcoholic beverages for 24 hours.  ACTIVITY:  You should plan to take it easy for the rest of today and you should NOT DRIVE or use heavy machinery until tomorrow (because of the sedation medicines used during the test).    FOLLOW UP: Our staff will call the number listed on your records 48-72 hours following your procedure to check on you and address any questions or concerns that you may have regarding the information given to you following your procedure. If we do not reach you, we will leave a message.  We will attempt to reach you two times.  During this call, we will ask if you have developed any symptoms of COVID 19. If you develop any symptoms (ie: fever, flu-like symptoms, shortness of breath, cough etc.) before then, please call [phone].  If you test positive for Covid 19 in the 2 weeks post procedure, please call and report this information to [place].    If any biopsies were taken you will be contacted by phone or by letter within the next 1-3 weeks.  Please call us at [phone] if you have not heard about the biopsies in 3 weeks.    SIGNATURES/CONFIDENTIALITY: You and/or your care partner have signed paperwork which will be entered into your electronic medical record.  These signatures attest to the fact that that the information above on your After Visit Summary has been reviewed and is understood.  Full responsibility of the confidentiality of this discharge information lies with you and/or your care-partner.

## 2019-09-02 NOTE — Progress Notes (Signed)
Report to PACU, RN, vss, BBS= Clear.  

## 2019-09-02 NOTE — Progress Notes (Signed)
Called to room to assist during endoscopic procedure.  Patient ID and intended procedure confirmed with present staff. Received instructions for my participation in the procedure from the performing physician.  

## 2019-09-04 ENCOUNTER — Telehealth: Payer: Self-pay | Admitting: *Deleted

## 2019-09-04 NOTE — Telephone Encounter (Signed)
°  Follow up Call-  Call back number 09/02/2019  Post procedure Call Back phone  # [phone]  Permission to leave phone message Yes  Some recent data might be hidden     Patient questions:  Do you have a fever, pain , or abdominal swelling? No. Pain Score  0 *  Have you tolerated food without any problems? yes  Have you been able to return to your normal activities? Yes.    Do you have any questions about your discharge instructions: Diet   No. Medications  No. Follow up visit  No.  Do you have questions or concerns about your Care? No.  Actions: * If pain score is 4 or above: No action needed, pain <4.  1. Have you developed a fever since your procedure? no  2.   Have you had an respiratory symptoms (SOB or cough) since your procedure? no  3.   Have you tested positive for COVID 19 since your procedure no  4.   Have you had any family members/close contacts diagnosed with the COVID 19 since your procedure?  no   If yes to any of these questions please route to Joylene John, RN and Joella Prince, RN

## 2019-09-10 ENCOUNTER — Encounter: Payer: Self-pay | Admitting: Internal Medicine

## 2019-09-12 ENCOUNTER — Ambulatory Visit (INDEPENDENT_AMBULATORY_CARE_PROVIDER_SITE_OTHER): Payer: Medicare Other | Admitting: Rheumatology

## 2019-09-12 ENCOUNTER — Other Ambulatory Visit: Payer: Self-pay

## 2019-09-12 ENCOUNTER — Encounter: Payer: Self-pay | Admitting: Rheumatology

## 2019-09-12 VITALS — BP 135/84 | HR 86 | Resp 13 | Ht 65.0 in | Wt 159.4 lb

## 2019-09-12 DIAGNOSIS — M359 Systemic involvement of connective tissue, unspecified: Secondary | ICD-10-CM | POA: Diagnosis not present

## 2019-09-12 DIAGNOSIS — Z79899 Other long term (current) drug therapy: Secondary | ICD-10-CM | POA: Diagnosis not present

## 2019-09-12 DIAGNOSIS — Z96653 Presence of artificial knee joint, bilateral: Secondary | ICD-10-CM

## 2019-09-12 DIAGNOSIS — M19042 Primary osteoarthritis, left hand: Secondary | ICD-10-CM

## 2019-09-12 DIAGNOSIS — M19041 Primary osteoarthritis, right hand: Secondary | ICD-10-CM

## 2019-09-12 DIAGNOSIS — Z853 Personal history of malignant neoplasm of breast: Secondary | ICD-10-CM

## 2019-09-12 DIAGNOSIS — Z8639 Personal history of other endocrine, nutritional and metabolic disease: Secondary | ICD-10-CM

## 2019-09-12 DIAGNOSIS — M19071 Primary osteoarthritis, right ankle and foot: Secondary | ICD-10-CM

## 2019-09-12 DIAGNOSIS — Z8679 Personal history of other diseases of the circulatory system: Secondary | ICD-10-CM

## 2019-09-12 DIAGNOSIS — M19072 Primary osteoarthritis, left ankle and foot: Secondary | ICD-10-CM

## 2019-09-12 DIAGNOSIS — Z13820 Encounter for screening for osteoporosis: Secondary | ICD-10-CM

## 2019-09-12 NOTE — Patient Instructions (Signed)
COVID-19 vaccine recommendations:   COVID-19 vaccine is recommended for everyone (unless you are allergic to a vaccine component), even if you are on a medication that suppresses your immune system.   Do not take Tylenol or any anti-inflammatory medications (NSAIDs) 24 hours prior to the COVID-19 vaccination.   There is no direct evidence about the efficacy of the COVID-19 vaccine in individuals who are on medications that suppress the immune system.   Even if you are fully vaccinated, and you are on any medications that suppress your immune system, please continue to wear a mask, maintain at least six feet social distance and practice hand hygiene.   If you develop a COVID-19 infection, please contact your PCP or our office to determine if you need antibody infusion.  The booster vaccine is now available for immunocompromised patients. It is advised that if you had Pfizer vaccine you should get Pfizer booster.  If you had a Moderna vaccine then you should get a Moderna booster. Johnson and Johnson does not have a booster vaccine at this time.  Please see the following web sites for updated information.   [email or web address]://[email or web address]/[email or web address]-Vaccination-Patient-Resources.[email or web address]  [email or web address]://[email or web address]/About-[email or web address]/ID/1159  

## 2019-09-17 ENCOUNTER — Other Ambulatory Visit: Payer: Self-pay

## 2019-09-20 ENCOUNTER — Telehealth: Payer: Self-pay | Admitting: *Deleted

## 2019-09-20 NOTE — Telephone Encounter (Signed)
Labs received from [facility]  Reviewed by Hazel Sams , PA-C  Drawn on 09/12/2019  Cholesterol 241 LDL/HDL Ratios 2.7 CBC/CMP WNL TSH WNL Uric Acid 5.1 WNL

## 2019-09-27 ENCOUNTER — Other Ambulatory Visit: Payer: Self-pay | Admitting: Internal Medicine

## 2019-09-27 DIAGNOSIS — Z1231 Encounter for screening mammogram for malignant neoplasm of breast: Secondary | ICD-10-CM

## 2019-11-11 ENCOUNTER — Other Ambulatory Visit: Payer: Self-pay

## 2019-11-11 ENCOUNTER — Ambulatory Visit
Admission: RE | Admit: 2019-11-11 | Discharge: 2019-11-11 | Disposition: A | Discharge status: Home / Self Care | Payer: Medicare Other | Source: Ambulatory Visit | Attending: Internal Medicine | Admitting: Internal Medicine

## 2019-11-11 DIAGNOSIS — Z1231 Encounter for screening mammogram for malignant neoplasm of breast: Secondary | ICD-10-CM

## 2020-03-16 NOTE — Progress Notes (Signed)
Office Visit Note  Patient: Yolanda Peters             Date of Birth: 02-14-1940           MRN: [id number]             PCP: Shon Baton, MD Referring: Shon Baton, MD Visit Date: 03/17/2020 Occupation: @GUAROCC @  Subjective:  Pain in both hips.   History of Present Illness: Yolanda Peters is a 80 y.o. female with history of osteoarthritis and autoimmune disease. She has of been off Plaquenil for several years. She states she has not noticed a difference since stopping the medication. She has been flares of aches in her knees and her hips. She says her pain radiates below her knees into her legs. She takes Tylenol 1-2 times per day. She denies any sores in her nose/mouth, rash, dry eyes or mouth. She stays active through water aerobics. She received a cortisone injection in her knees previously. She has no history of DM. Her blood pressure is 129/81 today.   Activities of Daily Living:  Patient reports morning stiffness for 0 minutes.   Patient Reports nocturnal pain.  Difficulty dressing/grooming: Denies Difficulty climbing stairs: Denies Difficulty getting out of chair: Denies Difficulty using hands for taps, buttons, cutlery, and/or writing: Denies  Review of Systems  Constitutional: Negative for fatigue.  HENT: Negative for mouth sores, mouth dryness and nose dryness.   Eyes: Negative for pain, itching, visual disturbance and dryness.  Respiratory: Negative for cough, hemoptysis, shortness of breath and difficulty breathing.   Cardiovascular: Positive for swelling in legs/feet. Negative for chest pain and palpitations.  Gastrointestinal: Negative for abdominal pain, blood in stool, constipation and diarrhea.  Endocrine: Negative for increased urination.  Genitourinary: Negative for painful urination.  Musculoskeletal: Positive for arthralgias and joint pain. Negative for joint swelling, myalgias, muscle weakness, morning stiffness, muscle tenderness and myalgias.  Skin:  Negative for color change, rash and redness.  Allergic/Immunologic: Negative for susceptible to infections.  Neurological: Negative for dizziness, numbness, headaches, memory loss and weakness.  Hematological: Negative for swollen glands.  Psychiatric/Behavioral: Negative for confusion and sleep disturbance.    PMFS History:  Patient Active Problem List   Diagnosis Date Noted  . Hypokalemia 06/27/2018  . Overweight (BMI 25.0-29.9) 06/27/2018  . S/P right TKA 06/26/2018  . Autoimmune disease ([place]) 03/15/2016  . High risk medication use 03/15/2016  . Pain in joint of right shoulder 03/15/2016  . Bilateral hip pain 03/15/2016  . Primary osteoarthritis of both knees 03/15/2016  . 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 Arthritis 02/24/2014  . HTN (hypertension) 02/24/2014  . Hyperlipidemia 02/24/2014  . Breast cancer, right, upper outer quadrant  07/05/2010    Past Medical History:  Diagnosis Date  . Allergy   . Arthritis   . Breast cancer ([place]) 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, skin cnacer  . Cancer ([place])    right breast   . Hyperlipidemia    no meds taken  . Hypertension   . Personal history of radiation therapy 2011   rt breast    Family History  Problem Relation Age of Onset  . Cancer Mother        bladder  . Cancer Father        leukemia  . Cancer Maternal Aunt        breast  . Colon cancer Paternal Aunt        pt is unsure of this  . Colon cancer  Cousin        pt is unsure of this  . Esophageal cancer Neg Hx   . Rectal cancer Neg Hx   . Stomach cancer Neg Hx    Past Surgical History:  Procedure Laterality Date  . BREAST DUCTAL SYSTEM EXCISION     right breast  . BREAST LUMPECTOMY    . CATARACT EXTRACTION     bilateral  . CESAREAN SECTION    . COLONOSCOPY    . ELBOW SURGERY     left  . JOINT REPLACEMENT    . KNEE ARTHROPLASTY Left    March 2021  . MYOMECTOMY    . REPLACEMENT TOTAL KNEE Left 03/2019  . TOTAL KNEE ARTHROPLASTY Right 06/26/2018   Procedure:  TOTAL KNEE ARTHROPLASTY;  Surgeon: Paralee Cancel, MD;  Location: [facility];  Service: Orthopedics;  Laterality: Right;  70 mins   Social History   Social History Narrative  . Not on file   Immunization History  Administered Date(s) Administered  . Influenza Split 09/24/2013, 09/24/2014  . PFIZER([place])SARS-COV-2 Vaccination 01/22/2019, 02/11/2019  . Tdap 08/28/2014  . Zoster Recombinat (Shingrix) 10/12/2017     Objective: Vital Signs: BP 129/81 (BP Location: Left Arm, Patient Position: Sitting, Cuff Size: Normal)   Pulse 80   Ht 5\' 4"  (1.626 m)   Wt 162 lb 3.2 oz (73.6 kg)   BMI 27.84 kg/m    Physical Exam Vitals and nursing note reviewed.  Constitutional:      Appearance: She is well-developed and well-nourished.  HENT:     Head: Normocephalic and atraumatic.  Eyes:     Extraocular Movements: EOM normal.     Conjunctiva/sclera: Conjunctivae normal.  Cardiovascular:     Rate and Rhythm: Normal rate and regular rhythm.     Pulses: Intact distal pulses.     Heart sounds: Normal heart sounds.  Pulmonary:     Effort: Pulmonary effort is normal.     Breath sounds: Normal breath sounds.  Abdominal:     General: Bowel sounds are normal.     Palpations: Abdomen is soft.  Musculoskeletal:     Cervical back: Normal range of motion.  Lymphadenopathy:     Cervical: No cervical adenopathy.  Skin:    General: Skin is warm and dry.     Capillary Refill: Capillary refill takes less than 2 seconds.  Neurological:     Mental Status: She is alert and oriented to person, place, and time.  Psychiatric:        Mood and Affect: Mood and affect normal.        Behavior: Behavior normal.      Musculoskeletal Exam: Good ROM of her c-spine. Good ROM of her shoulders and elbows bilaterally. Severe osteoarthritis with subluxation of her PIP and DIP joints. Thickened synovium of her PIP and DIP joints. Good ROM of her hip and knee joints bilaterally. Bilateral knee replacements.  Tenderness over piriformis muscle bilaterally. No tenderness across her MTPs.   CDAI Exam: CDAI Score: -- Patient Global: --; Provider Global: -- Swollen: --; Tender: -- Joint Exam 03/17/2020   No joint exam has been documented for this visit   There is currently no information documented on the homunculus. Go to the Rheumatology activity and complete the homunculus joint exam.  Investigation: No additional findings.  Imaging: No results found.  Recent Labs: Lab Results  Component Value Date   WBC 11.4 (H) 06/27/2018   HGB 9.9 (L) 06/27/2018   PLT 233 06/27/2018  NA 139 06/27/2018   K 3.0 (L) 06/27/2018   CL 101 06/27/2018   CO2 25 06/27/2018   GLUCOSE 154 (H) 06/27/2018   BUN 24 (H) 06/27/2018   CREATININE 0.82 06/27/2018   BILITOT 0.9 01/22/2018   ALKPHOS 87 08/17/2016   AST 17 01/22/2018   ALT 18 01/22/2018   PROT 6.0 (L) 01/22/2018   ALBUMIN 4.2 08/17/2016   CALCIUM 7.7 (L) 06/27/2018   GFRAA >60 06/27/2018    Speciality Comments: [facility]  Procedures: Injection of bilateral piriformis region Trigger Point Inj  Date/Time: 03/17/2020 1:47 PM Performed by: Bo Merino, MD Authorized by: Bo Merino, MD   Consent Given by:  Patient Site marked: the procedure site was marked   Timeout: prior to procedure the correct patient, procedure, and site was verified   Indications:  Muscle spasm and pain Total # of Trigger Points:  2 Location: neck and hip   Location comment:  Piriformis Needle Size:  27 G Approach:  Dorsal Medications #1:  1 mL lidocaine 1 %; 40 mg triamcinolone acetonide 40 MG/ML Medications #2:  1 mL lidocaine 1 %; 40 mg triamcinolone acetonide 40 MG/ML Patient tolerance:  Patient tolerated the procedure well with no immediate complications   Allergies: Penicillins and Tetracyclines & related   Assessment / Plan:     Visit Diagnoses: Bilateral piriformis syndrome-she had  tenderness over bilateral piriformis region.  She requested cortisone injection.  Indications side effects contraindications were discussed.  Patient wanted to proceed with injections.  Per her request bilateral piriformis area were just injected with cortisone.  Stretching exercises were demonstrated.  Postprocedure instructions were given.  She tolerated the procedure well.  I advised her to contact us in case her symptoms do not improve.  She may also benefit from physical therapy.  Status post total bilateral knee replacement-doing well.  Primary osteoarthritis of both hands-she has severe osteoarthritis in her bilateral hands with subluxation of PIP and DIP joints.  No synovitis was noted.  Primary osteoarthritis of both feet-she had no tenderness over MTPs or PIPs.  Autoimmune disease ([place]) - in remission.  High risk medication use - she has been off Plaquenil  History of breast cancer  History of hyperlipidemia  History of hypertension-her blood pressure was normal today.  She has been advised to monitor blood pressure closely after cortisone injection.  Osteoporosis screening - Patient will get her DEXA scan with Dr. Milta Deiters her GYN.  Orders: Orders Placed This Encounter  Procedures  . Trigger Point Inj   No orders of the defined types were placed in this encounter.    Follow-Up Instructions: Return in about 6 months (around 09/17/2020) for Osteoarthritis.   Bo Merino, MD  Note - This record has been created using Editor, commissioning.  Chart creation errors have been sought, but may not always  have been located. Such creation errors do not reflect on  the standard of medical care.

## 2020-03-17 ENCOUNTER — Ambulatory Visit (INDEPENDENT_AMBULATORY_CARE_PROVIDER_SITE_OTHER): Payer: Medicare Other | Admitting: Rheumatology

## 2020-03-17 ENCOUNTER — Other Ambulatory Visit: Payer: Self-pay

## 2020-03-17 ENCOUNTER — Encounter: Payer: Self-pay | Admitting: Rheumatology

## 2020-03-17 VITALS — BP 129/81 | HR 80 | Ht 64.0 in | Wt 162.2 lb

## 2020-03-17 DIAGNOSIS — M19071 Primary osteoarthritis, right ankle and foot: Secondary | ICD-10-CM

## 2020-03-17 DIAGNOSIS — Z853 Personal history of malignant neoplasm of breast: Secondary | ICD-10-CM

## 2020-03-17 DIAGNOSIS — M19041 Primary osteoarthritis, right hand: Secondary | ICD-10-CM | POA: Diagnosis not present

## 2020-03-17 DIAGNOSIS — G5703 Lesion of sciatic nerve, bilateral lower limbs: Secondary | ICD-10-CM | POA: Diagnosis not present

## 2020-03-17 DIAGNOSIS — M19072 Primary osteoarthritis, left ankle and foot: Secondary | ICD-10-CM

## 2020-03-17 DIAGNOSIS — Z8679 Personal history of other diseases of the circulatory system: Secondary | ICD-10-CM

## 2020-03-17 DIAGNOSIS — M359 Systemic involvement of connective tissue, unspecified: Secondary | ICD-10-CM

## 2020-03-17 DIAGNOSIS — Z79899 Other long term (current) drug therapy: Secondary | ICD-10-CM

## 2020-03-17 DIAGNOSIS — Z96653 Presence of artificial knee joint, bilateral: Secondary | ICD-10-CM

## 2020-03-17 DIAGNOSIS — M19042 Primary osteoarthritis, left hand: Secondary | ICD-10-CM

## 2020-03-17 DIAGNOSIS — Z8639 Personal history of other endocrine, nutritional and metabolic disease: Secondary | ICD-10-CM

## 2020-03-17 DIAGNOSIS — Z13820 Encounter for screening for osteoporosis: Secondary | ICD-10-CM

## 2020-03-17 MED ORDER — TRIAMCINOLONE ACETONIDE 40 MG/ML IJ SUSP
40.0000 mg | INTRAMUSCULAR | Status: AC | PRN
  Administered 2020-03-17: 40 mg via INTRAMUSCULAR

## 2020-03-17 MED ORDER — LIDOCAINE HCL 1 % IJ SOLN
1.0000 mL | INTRAMUSCULAR | Status: AC | PRN
  Administered 2020-03-17: 1 mL

## 2020-07-26 ENCOUNTER — Emergency Department (HOSPITAL_BASED_OUTPATIENT_CLINIC_OR_DEPARTMENT_OTHER)
Admission: EM | Admit: 2020-07-26 | Discharge: 2020-07-26 | Disposition: A | Discharge status: Home / Self Care | Payer: Medicare Other | Attending: Emergency Medicine | Admitting: Emergency Medicine

## 2020-07-26 ENCOUNTER — Encounter (HOSPITAL_BASED_OUTPATIENT_CLINIC_OR_DEPARTMENT_OTHER): Payer: Self-pay

## 2020-07-26 ENCOUNTER — Other Ambulatory Visit: Payer: Self-pay

## 2020-07-26 DIAGNOSIS — I10 Essential (primary) hypertension: Secondary | ICD-10-CM | POA: Diagnosis not present

## 2020-07-26 DIAGNOSIS — S81811A Laceration without foreign body, right lower leg, initial encounter: Secondary | ICD-10-CM | POA: Diagnosis not present

## 2020-07-26 DIAGNOSIS — W010XXA Fall on same level from slipping, tripping and stumbling without subsequent striking against object, initial encounter: Secondary | ICD-10-CM | POA: Insufficient documentation

## 2020-07-26 DIAGNOSIS — S61512A Laceration without foreign body of left wrist, initial encounter: Secondary | ICD-10-CM | POA: Insufficient documentation

## 2020-07-26 DIAGNOSIS — S61511A Laceration without foreign body of right wrist, initial encounter: Secondary | ICD-10-CM | POA: Diagnosis not present

## 2020-07-26 DIAGNOSIS — S51011A Laceration without foreign body of right elbow, initial encounter: Secondary | ICD-10-CM

## 2020-07-26 DIAGNOSIS — Z853 Personal history of malignant neoplasm of breast: Secondary | ICD-10-CM | POA: Diagnosis not present

## 2020-07-26 DIAGNOSIS — Y92009 Unspecified place in unspecified non-institutional (private) residence as the place of occurrence of the external cause: Secondary | ICD-10-CM | POA: Insufficient documentation

## 2020-07-26 DIAGNOSIS — T148XXA Other injury of unspecified body region, initial encounter: Secondary | ICD-10-CM

## 2020-07-26 DIAGNOSIS — Z79899 Other long term (current) drug therapy: Secondary | ICD-10-CM | POA: Diagnosis not present

## 2020-07-26 DIAGNOSIS — Z9011 Acquired absence of right breast and nipple: Secondary | ICD-10-CM | POA: Diagnosis not present

## 2020-07-26 DIAGNOSIS — Z96653 Presence of artificial knee joint, bilateral: Secondary | ICD-10-CM | POA: Insufficient documentation

## 2020-07-26 DIAGNOSIS — S8991XA Unspecified injury of right lower leg, initial encounter: Secondary | ICD-10-CM | POA: Diagnosis present

## 2020-07-26 MED ORDER — ACETAMINOPHEN 325 MG PO TABS
650.0000 mg | ORAL_TABLET | Freq: Once | ORAL | Status: AC
  Administered 2020-07-26: 650 mg via ORAL
  Filled 2020-07-26: qty 2

## 2020-07-26 NOTE — ED Triage Notes (Signed)
She c/o having tripped (didn't pass out) at [place] [facility] [place] which [place] was [place] today. She has skin tears at right elbow and right lower leg areas. She is awake, alert and oriented x 4 with clear speech.

## 2020-07-26 NOTE — ED Notes (Signed)
States fell down stairs hitting multiple body places.  Large skin tear and bruising to right shin and hematoma to above ankle.  About 1/2 inch lac not to right elbow;  Abrasion to left wrist.  Does not remember hitting head and denies LOC.  But states has bump to back of head.

## 2020-07-26 NOTE — ED Provider Notes (Signed)
[facility] EMERGENCY DEPT Provider Note   CSN: [id number] Arrival date & time: 07/26/20  1448     History Chief Complaint  Patient presents with   Fall   skin tears    Yolanda Peters is a 80 y.o. female.  HPI 80 year old female presents with right leg skin tear/laceration.  She fell down the steps a couple hours ago.  The biggest injury was to her right leg but she also has other skin tears including her left wrist, right wrist, right elbow.  Think she might of briefly hit her head and has a small bit of ecchymosis to her scalp.  However she has no headache, nausea, vomiting, LOC.  No neck pain.  She is sore in her right leg but states it just seems like it is bruising type pain.  She has been ambulatory and drove here.  Her tetanus is up-to-date. She is not on blood thinners.  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   Autoimmune disease ([place]) 03/15/2016   High risk medication use 03/15/2016   Pain in joint of right shoulder 03/15/2016   Bilateral hip pain 03/15/2016   Primary osteoarthritis of both knees 03/15/2016   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   Arthritis 02/24/2014   HTN (hypertension) 02/24/2014   Hyperlipidemia 02/24/2014   Breast cancer, right, upper outer quadrant  07/05/2010    Past Surgical History:  Procedure Laterality Date   BREAST DUCTAL SYSTEM EXCISION     right breast   BREAST LUMPECTOMY     CATARACT EXTRACTION     bilateral   CESAREAN SECTION     COLONOSCOPY     ELBOW SURGERY     left   JOINT REPLACEMENT     KNEE ARTHROPLASTY Left    March 2021    MYOMECTOMY     REPLACEMENT TOTAL KNEE Left 03/2019   TOTAL KNEE ARTHROPLASTY Right 06/26/2018   Procedure: TOTAL KNEE ARTHROPLASTY;  Surgeon: Paralee Cancel, MD;  Location: [facility];  Service: Orthopedics;  Laterality: Right;  70 mins     OB History   No obstetric history on file.     Family History  Problem Relation Age of Onset   Cancer Mother        bladder   Cancer Father        leukemia   Cancer Maternal Aunt        breast   Colon cancer Paternal Aunt        pt is unsure of this   Colon cancer Cousin        pt is unsure of this   Esophageal cancer Neg Hx    Rectal cancer Neg Hx    Stomach cancer Neg Hx     Social History   Tobacco Use   Smoking status: Never   Smokeless tobacco: Never  Vaping Use   Vaping Use: Never used  Substance Use Topics   Alcohol use: Yes    Comment: occ   Drug use: No  Home Medications Prior to Admission medications   Medication Sig Start Date End Date Taking? Authorizing Provider  APPLE CIDER VINEGAR PO Take 625 mg by mouth daily.    [provider]  Ascorbic Acid (VITAMIN C) 1000 MG tablet Take 1,000 mg by mouth daily.    [provider]  beta carotene 10000 UNIT capsule Take 25,000 Units by mouth daily.    [provider]  CALCIUM & MAGNESIUM CARBONATES PO Take 2 tablets by mouth daily.    [provider]  Cholecalciferol (VITAMIN D-3 PO) Take 2,000 Units by mouth daily.    [provider]  Ginger, Zingiber officinalis, (GINGER ROOT PO) Take 200 mg by mouth daily.    [provider]  hydrochlorothiazide (HYDRODIURIL) 25 MG tablet Take 25 mg by mouth daily as needed (fluid or edema).  Patient not taking: No sig reported 07/29/10   [provider]  loratadine (CLARITIN) 10 MG tablet Take 10 mg by mouth daily. Patient not taking: Reported on 03/17/2020    [provider]  Misc Natural Products (TART CHERRY ADVANCED PO) Take 465 mg by mouth daily.  Patient not taking:  No sig reported    [provider]  Multiple Vitamins-Minerals (ZINC PO) Take 50 mg by mouth daily.    [provider]  potassium chloride 20 MEQ TBCR Take 40 mEq by mouth daily for 4 days. 06/27/18 07/01/18  Danae Orleans, PA-C  Probiotic Product (PROBIOTIC DAILY PO) Take 1 capsule by mouth daily.     [provider]  verapamil (VERELAN PM) 360 MG 24 hr capsule Take 360 mg by mouth daily.  12/27/13   [provider]  vitamin E 1000 UNIT capsule Take 1,000 Units by mouth daily.    [provider]    Allergies    Penicillins and Tetracyclines & related  Review of Systems   Review of Systems  Gastrointestinal:  Negative for vomiting.  Musculoskeletal:  Positive for arthralgias.  Skin:  Positive for wound.  Neurological:  Negative for syncope, weakness, numbness and headaches.  All other systems reviewed and are negative.  Physical Exam Updated Vital Signs BP (!) 144/84 (BP Location: Left Arm)   Pulse 96   Temp 98.2 F (36.8 C) (Oral)   Resp 18   SpO2 97%   Physical Exam Vitals and nursing note reviewed.  Constitutional:      Appearance: She is well-developed.  HENT:     Head: Normocephalic. Contusion present.      Right Ear: External ear normal.     Left Ear: External ear normal.     Nose: Nose normal.  Eyes:     General:        Right eye: No discharge.        Left eye: No discharge.     Extraocular Movements: Extraocular movements intact.     Pupils: Pupils are equal, round, and reactive to light.  Cardiovascular:     Rate and Rhythm: Normal rate and regular rhythm.     Pulses:          Dorsalis pedis pulses are 2+ on the right side.     Heart sounds: Normal heart sounds.  Pulmonary:     Effort: Pulmonary effort is normal.     Breath sounds: Normal breath sounds.  Abdominal:     Palpations: Abdomen is soft.     Tenderness: There is no abdominal tenderness.  Musculoskeletal:     Right elbow: Laceration (~1 cm  laceration at point of elbow) present. Normal range of motion. No tenderness.     Right knee: Normal range of motion. No tenderness.     Right lower leg: Laceration and tenderness present.     Right ankle: No tenderness.       Legs:     Comments: Small skin tears to right and left wrist Sizable skin tear to right shin  Skin:    General: Skin is warm and dry.  Neurological:     Mental Status: She is alert.     Comments: CN 3-12 grossly intact. 5/5 strength in all 4 extremities. Grossly normal sensation. Normal finger to nose.   Psychiatric:        Mood and Affect: Mood is not anxious.    ED Results / Procedures / Treatments   Labs (all labs ordered are listed, but only abnormal results are displayed) Labs Reviewed - No data to display  EKG None  Radiology No results found.  Procedures .Marland KitchenLaceration Repair  Date/Time: 07/26/2020 4:31 PM Performed by: Sherwood Gambler, MD Authorized by: Sherwood Gambler, MD   Consent:    Consent obtained:  Verbal   Consent given by:  Patient Laceration details:    Location:  Shoulder/arm   Shoulder/arm location:  R elbow   Length (cm):  1 Skin repair:    Repair method:  Steri-Strips (dermaclip)   Number of Steri-Strips:  1 Approximation:    Approximation:  Close Repair type:    Repair type:  Simple .Marland KitchenLaceration Repair  Date/Time: 07/26/2020 4:31 PM Performed by: Sherwood Gambler, MD Authorized by: Sherwood Gambler, MD   Consent:    Consent obtained:  Verbal   Consent given by:  Patient Laceration details:    Location:  Leg   Leg location:  R lower leg   Length (cm):  12 Skin repair:    Repair method:  Steri-Strips (dermaclip)   Number of Steri-Strips:  4 Approximation:    Approximation:  Loose Repair type:    Repair type:  Simple Post-procedure details:    Procedure completion:  Tolerated well, no immediate complications   Medications Ordered in ED Medications  acetaminophen (TYLENOL) tablet 650 mg (650 mg Oral Given  07/26/20 1609)    ED Course  I have reviewed the triage vital signs and the nursing notes.  Pertinent labs & imaging results that were available during my care of the patient were reviewed by me and considered in my medical decision making (see chart for details).    MDM Rules/Calculators/A&P                          Patient is well-appearing and neurovascular intact.  Does have laceration/skin tears as above.  I offered imaging, including of her head and leg but she declines.  I think this is reasonable given she has been ambulatory and no significant headache or LOC.  Otherwise, Dermacool abuse that she did not want sutures for her elbow and I do not think this would hold for the skin tear of the lower leg.  We will cover the wounds and have her follow-up with PCP as needed. Final Clinical Impression(s) / ED Diagnoses Final diagnoses:  Leg laceration, right, initial encounter  Elbow laceration, right, initial encounter  Multiple skin tears    Rx / DC Orders ED Discharge Orders     None        Sherwood Gambler, MD 07/26/20 [phone]

## 2020-07-26 NOTE — ED Notes (Signed)
Right lower leg wound covered with mepilex nonadherent dressing. pts right elbow covered with nonadherent dressing and ace wrap. Pt's abrasions to right and left forearm covered with antibiotic ointment and bandaids. Reviewed dressing changes with patient and verbalized understanding. Pt dc home with belongings and stated understanding of dc instruction.s

## 2020-07-28 IMAGING — MG DIGITAL SCREENING BILATERAL MAMMOGRAM WITH TOMO AND CAD
8 series · 8 of 24 positions shown · non-contrast
Comparison: Previous exam(s).

CLINICAL DATA: Screening.

EXAM:
DIGITAL SCREENING BILATERAL MAMMOGRAM WITH TOMO AND CAD

[R MLO synth-2D]
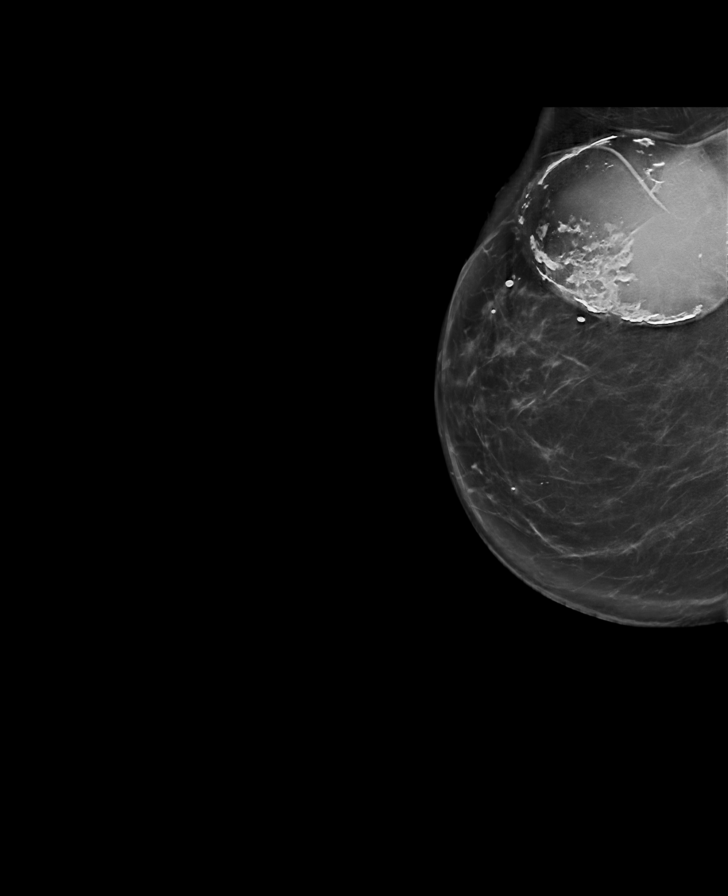

[L CC synth-2D]
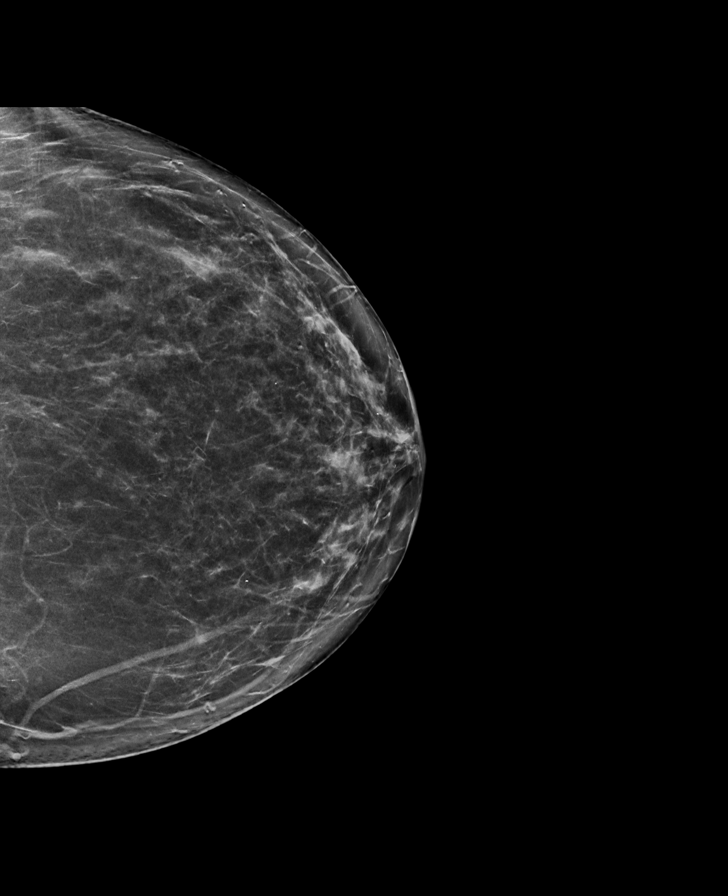

[L MLO synth-2D]
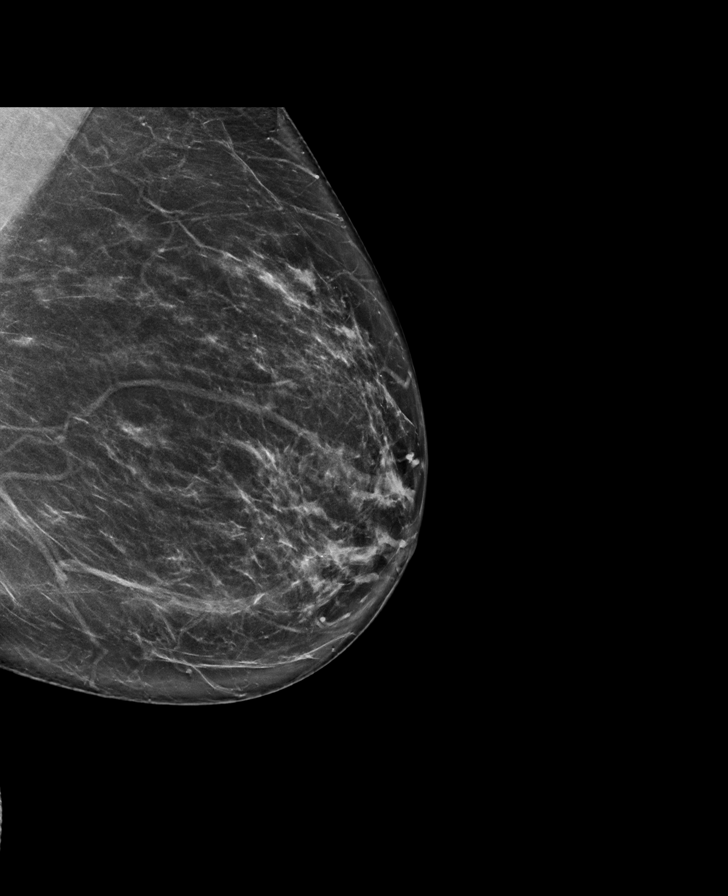

[R CC synth-2D]
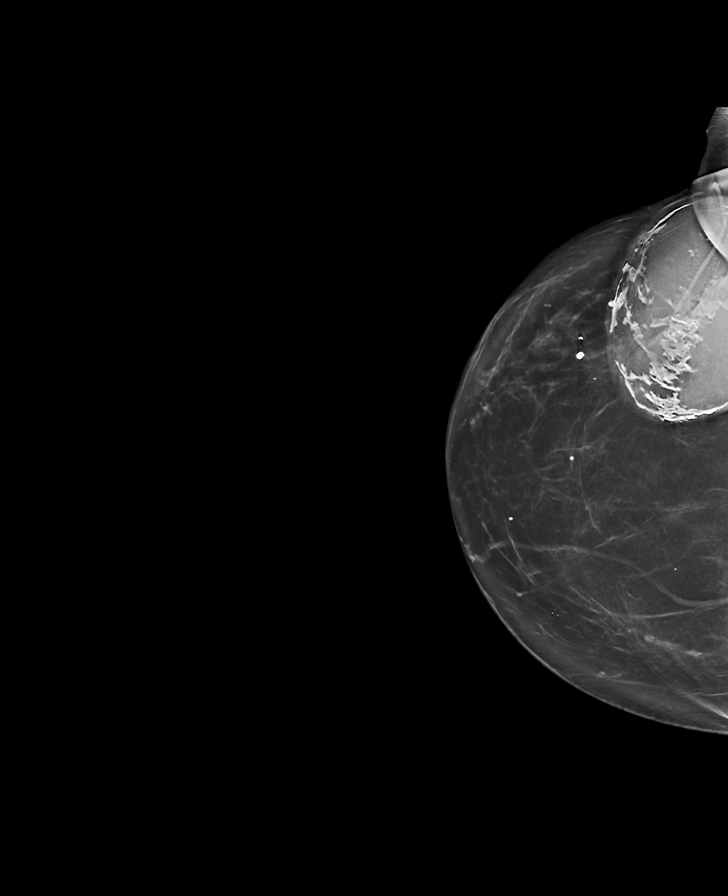

[R CC tomo · tomo slice 47/92.0]
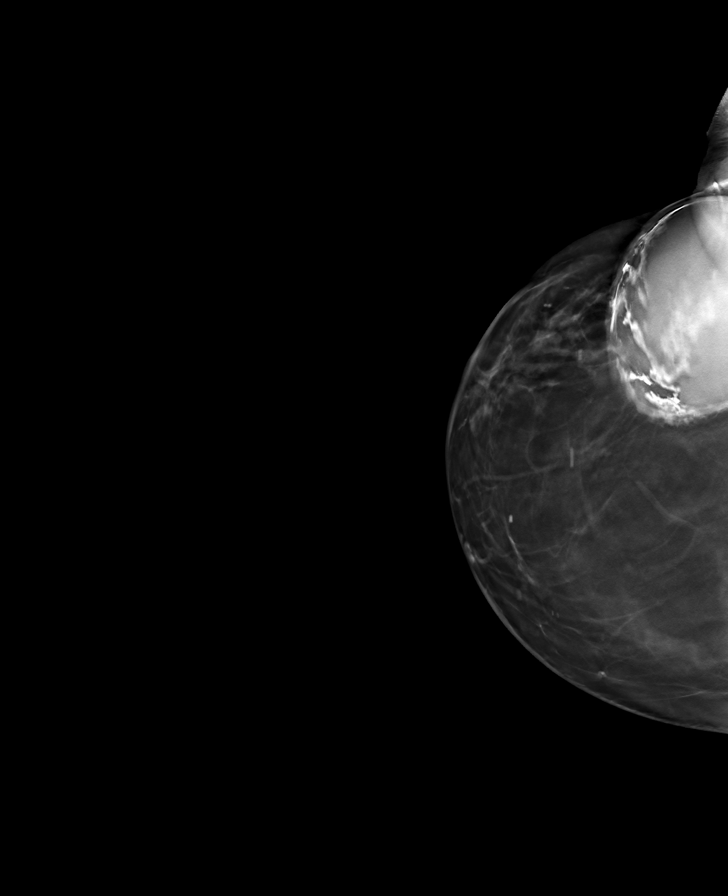

[L CC tomo · tomo slice 39/78.0]
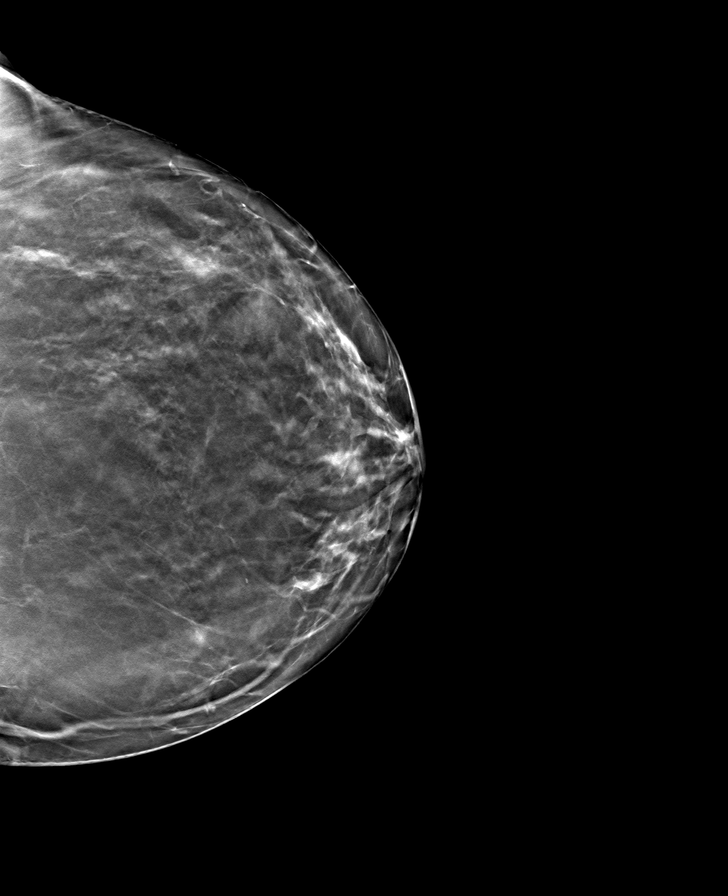

[R MLO tomo · tomo slice 48/95.0]
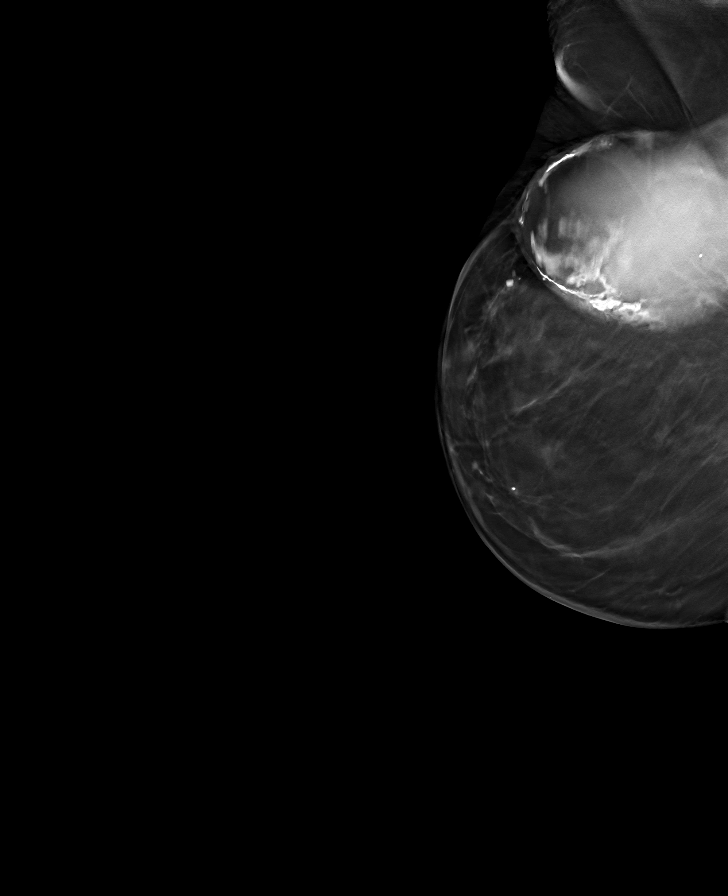

[L MLO tomo · tomo slice 42/83.0]
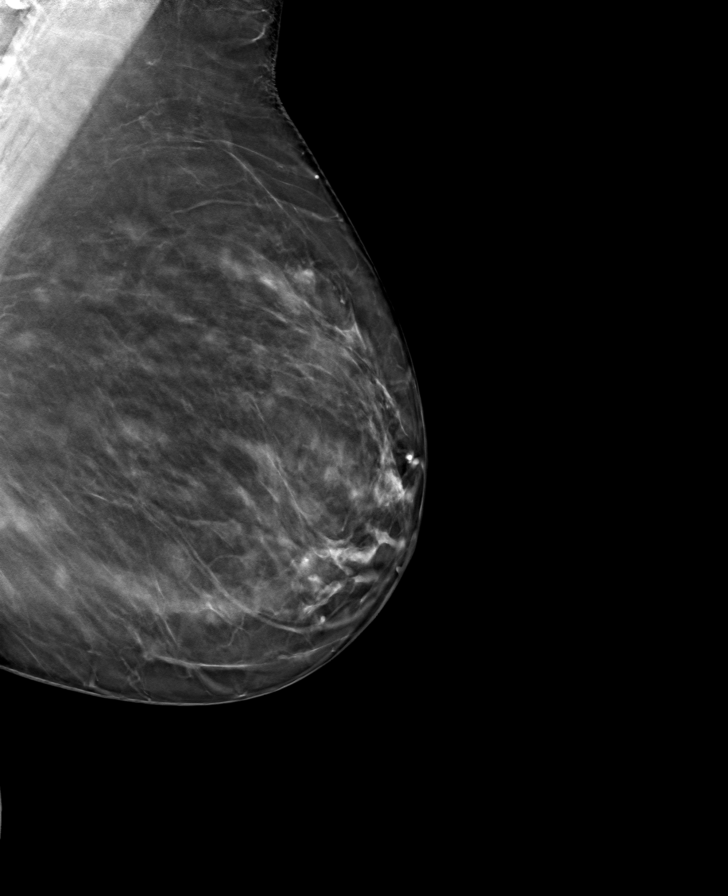

[8 of 24 positions shown; findings below may reference images not displayed]

ACR Breast Density Category b: There are scattered areas of
fibroglandular density.
FINDINGS: There are no findings suspicious for malignancy. Images were
processed with CAD.
IMPRESSION: No mammographic evidence of malignancy. A result letter of this
screening mammogram will be mailed directly to the patient.

RECOMMENDATION:
Screening mammogram in one year. (Code:[id number]-775)

BI-RADS CATEGORY  1: Negative.

## 2020-08-04 ENCOUNTER — Emergency Department (INDEPENDENT_AMBULATORY_CARE_PROVIDER_SITE_OTHER)
Admission: RE | Admit: 2020-08-04 | Discharge: 2020-08-04 | Disposition: A | Discharge status: Other Acute Inpatient Hospital | Payer: Medicare Other | Source: Ambulatory Visit

## 2020-08-04 ENCOUNTER — Other Ambulatory Visit: Payer: Self-pay

## 2020-08-04 ENCOUNTER — Inpatient Hospital Stay (HOSPITAL_COMMUNITY)
Admission: EM | Admit: 2020-08-04 | Discharge: 2020-08-11 | DRG: 574 | Disposition: A | Discharge status: Home Health | Payer: Medicare Other | Attending: Internal Medicine | Admitting: Internal Medicine

## 2020-08-04 ENCOUNTER — Emergency Department (HOSPITAL_COMMUNITY): Payer: Medicare Other

## 2020-08-04 ENCOUNTER — Encounter (HOSPITAL_COMMUNITY): Payer: Self-pay

## 2020-08-04 VITALS — BP 149/85 | HR 99 | Temp 98.1°F | Resp 16 | Ht 64.0 in | Wt 155.0 lb

## 2020-08-04 DIAGNOSIS — W109XXA Fall (on) (from) unspecified stairs and steps, initial encounter: Secondary | ICD-10-CM | POA: Diagnosis present

## 2020-08-04 DIAGNOSIS — I10 Essential (primary) hypertension: Secondary | ICD-10-CM | POA: Diagnosis present

## 2020-08-04 DIAGNOSIS — I872 Venous insufficiency (chronic) (peripheral): Secondary | ICD-10-CM | POA: Diagnosis present

## 2020-08-04 DIAGNOSIS — M1990 Unspecified osteoarthritis, unspecified site: Secondary | ICD-10-CM | POA: Diagnosis present

## 2020-08-04 DIAGNOSIS — I878 Other specified disorders of veins: Secondary | ICD-10-CM | POA: Diagnosis present

## 2020-08-04 DIAGNOSIS — L03115 Cellulitis of right lower limb: Secondary | ICD-10-CM | POA: Diagnosis not present

## 2020-08-04 DIAGNOSIS — Z79899 Other long term (current) drug therapy: Secondary | ICD-10-CM

## 2020-08-04 DIAGNOSIS — Z96653 Presence of artificial knee joint, bilateral: Secondary | ICD-10-CM | POA: Diagnosis present

## 2020-08-04 DIAGNOSIS — Z853 Personal history of malignant neoplasm of breast: Secondary | ICD-10-CM

## 2020-08-04 DIAGNOSIS — L03116 Cellulitis of left lower limb: Secondary | ICD-10-CM

## 2020-08-04 DIAGNOSIS — I8391 Asymptomatic varicose veins of right lower extremity: Secondary | ICD-10-CM | POA: Diagnosis present

## 2020-08-04 DIAGNOSIS — Z806 Family history of leukemia: Secondary | ICD-10-CM

## 2020-08-04 DIAGNOSIS — L97913 Non-pressure chronic ulcer of unspecified part of right lower leg with necrosis of muscle: Secondary | ICD-10-CM

## 2020-08-04 DIAGNOSIS — Z881 Allergy status to other antibiotic agents status: Secondary | ICD-10-CM

## 2020-08-04 DIAGNOSIS — S81811A Laceration without foreign body, right lower leg, initial encounter: Secondary | ICD-10-CM | POA: Diagnosis present

## 2020-08-04 DIAGNOSIS — Z20822 Contact with and (suspected) exposure to covid-19: Secondary | ICD-10-CM | POA: Diagnosis present

## 2020-08-04 DIAGNOSIS — M069 Rheumatoid arthritis, unspecified: Secondary | ICD-10-CM | POA: Diagnosis present

## 2020-08-04 DIAGNOSIS — S81802A Unspecified open wound, left lower leg, initial encounter: Secondary | ICD-10-CM | POA: Diagnosis not present

## 2020-08-04 DIAGNOSIS — Z923 Personal history of irradiation: Secondary | ICD-10-CM

## 2020-08-04 DIAGNOSIS — Z880 Allergy status to penicillin: Secondary | ICD-10-CM

## 2020-08-04 DIAGNOSIS — Z800 Family history of malignant neoplasm of digestive organs: Secondary | ICD-10-CM

## 2020-08-04 DIAGNOSIS — I739 Peripheral vascular disease, unspecified: Secondary | ICD-10-CM | POA: Diagnosis present

## 2020-08-04 NOTE — ED Provider Notes (Signed)
[facility]    CSN: [id number] Arrival date & time: 08/04/20  1854      History   Chief Complaint Chief Complaint  Patient presents with   Rt leg swelling    W/ redness     HPI SHANEAKA PULK is a 80 y.o. female.   HPI 80 year old female presents with cellulitis of left lower leg with worsening wound from fall sustained 1 week ago.  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   Autoimmune disease ([place]) 03/15/2016   High risk medication use 03/15/2016   Pain in joint of right shoulder 03/15/2016   Bilateral hip pain 03/15/2016   Primary osteoarthritis of both knees 03/15/2016   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   Arthritis 02/24/2014   HTN (hypertension) 02/24/2014   Hyperlipidemia 02/24/2014   Breast cancer, right, upper outer quadrant  07/05/2010    Past Surgical History:  Procedure Laterality Date   BREAST DUCTAL SYSTEM EXCISION     right breast   BREAST LUMPECTOMY     CATARACT EXTRACTION     bilateral   CESAREAN SECTION     COLONOSCOPY     ELBOW SURGERY     left   JOINT REPLACEMENT     KNEE ARTHROPLASTY Left    March 2021   MYOMECTOMY     REPLACEMENT TOTAL KNEE Left 03/2019   TOTAL KNEE ARTHROPLASTY Right 06/26/2018   Procedure: TOTAL KNEE ARTHROPLASTY;  Surgeon: Paralee Cancel, MD;  Location: [facility];  Service: Orthopedics;  Laterality: Right;  70 mins    OB History   No obstetric history on file.      Home Medications    Prior to Admission medications   Medication Sig Start Date End Date  Taking? Authorizing Provider  APPLE CIDER VINEGAR PO Take 625 mg by mouth daily.    [provider]  Ascorbic Acid (VITAMIN C) 1000 MG tablet Take 1,000 mg by mouth daily.    [provider]  beta carotene 10000 UNIT capsule Take 25,000 Units by mouth daily.    [provider]  CALCIUM & MAGNESIUM CARBONATES PO Take 2 tablets by mouth daily.    [provider]  Cholecalciferol (VITAMIN D-3 PO) Take 2,000 Units by mouth daily.    [provider]  Ginger, Zingiber officinalis, (GINGER ROOT PO) Take 200 mg by mouth daily.    [provider]  hydrochlorothiazide (HYDRODIURIL) 25 MG tablet Take 25 mg by mouth daily as needed (fluid or edema).  Patient not taking: No sig reported 07/29/10   [provider]  loratadine (CLARITIN) 10 MG tablet Take 10 mg by mouth daily. Patient not taking: Reported on 03/17/2020    [provider]  Misc Natural Products (TART CHERRY ADVANCED PO) Take 465 mg by mouth daily.  Patient not taking: No sig reported    [provider]  Multiple Vitamins-Minerals (ZINC PO) Take 50 mg by mouth daily.  [provider]  potassium chloride 20 MEQ TBCR Take 40 mEq by mouth daily for 4 days. 06/27/18 07/01/18  Danae Orleans, PA-C  Probiotic Product (PROBIOTIC DAILY PO) Take 1 capsule by mouth daily.     [provider]  verapamil (VERELAN PM) 360 MG 24 hr capsule Take 360 mg by mouth daily.  12/27/13   [provider]  vitamin E 1000 UNIT capsule Take 1,000 Units by mouth daily.    [provider]    Family History Family History  Problem Relation Age of Onset   Cancer Mother        bladder   Cancer Father        leukemia   Cancer Maternal Aunt        breast   Colon cancer Paternal Aunt        pt is unsure of this   Colon cancer Cousin        pt is unsure of this   Esophageal cancer Neg Hx    Rectal cancer Neg Hx    Stomach cancer Neg Hx     Social  History Social History   Tobacco Use   Smoking status: Never   Smokeless tobacco: Never  Vaping Use   Vaping Use: Never used  Substance Use Topics   Alcohol use: Yes    Comment: occ   Drug use: No     Allergies   Penicillins and Tetracyclines & related   Review of Systems Review of Systems  Skin:  Positive for color change and wound.  All other systems reviewed and are negative.   Physical Exam Triage Vital Signs ED Triage Vitals  Enc Vitals Group     BP 08/04/20 1907 (!) 149/85     Pulse Rate 08/04/20 1907 99     Resp 08/04/20 1907 16     Temp 08/04/20 1907 98.1 F (36.7 C)     Temp Source 08/04/20 1907 Oral     SpO2 08/04/20 1907 96 %     Weight 08/04/20 1908 155 lb (70.3 kg)     Height 08/04/20 1908 '5\' 4"'$  (1.626 m)     Head Circumference --      Peak Flow --      Pain Score 08/04/20 1907 0     Pain Loc --      Pain Edu? --      Excl. in [place]? --    No data found.  Updated Vital Signs BP (!) 149/85 (BP Location: Left Arm)   Pulse 99   Temp 98.1 F (36.7 C) (Oral)   Resp 16   Ht '5\' 4"'$  (1.626 m)   Wt 155 lb (70.3 kg)   SpO2 96%   BMI 26.61 kg/m    Physical Exam Vitals and nursing note reviewed.  Constitutional:      General: She is not in acute distress.    Appearance: Normal appearance. She is normal weight. She is not ill-appearing.  HENT:     Head: Normocephalic and atraumatic.     Mouth/Throat:     Mouth: Mucous membranes are moist.     Pharynx: Oropharynx is clear.  Eyes:     Extraocular Movements: Extraocular movements intact.     Conjunctiva/sclera: Conjunctivae normal.     Pupils: Pupils are equal, round, and reactive to light.  Cardiovascular:     Rate and Rhythm: Normal rate and regular rhythm.     Pulses: Normal pulses.     Heart sounds:  Normal heart sounds.  Pulmonary:     Effort: Pulmonary effort is normal.     Breath sounds: Normal breath sounds.     Comments: No adventitious breath sounds noted Musculoskeletal:         General: Normal range of motion.     Cervical back: Normal range of motion and neck supple. No tenderness.  Lymphadenopathy:     Cervical: No cervical adenopathy.  Skin:    General: Skin is warm and dry.     Comments: Right lower leg (anterior aspect): 11.5 cm x 8.5 cm rectangular shaped deep skin avulsion with surrounding cellulitis, erythematous, indurated, fluctuant please see image taken this evening on exam  Neurological:     General: No focal deficit present.     Mental Status: She is alert and oriented to person, place, and time.  Psychiatric:        Mood and Affect: Mood normal.        Behavior: Behavior normal.       UC Treatments / Results  Labs (all labs ordered are listed, but only abnormal results are displayed) Labs Reviewed - No data to display  EKG   Radiology No results found.  Procedures Procedures (including critical care time)  Medications Ordered in UC Medications - No data to display  Initial Impression / Assessment and Plan / UC Course  I have reviewed the triage vital signs and the nursing notes.  Pertinent labs & imaging results that were available during my care of the patient were reviewed by me and considered in my medical decision making (see chart for details).     MDM: 1.  Cellulitis of left anterior lower leg-instructed patient/patient's daughter go to [place] long ED now for immediate evaluation and probable IV antibiotics advised we have called charge nurse at this ED to let them know you are on the way.  2.  Open wound of left lower leg initial encounter-please see picture above, advised patient/daughter she requires osteomyelitis work-up and wound care treatment immediately with follow-up.  Patient discharged, hemodynamically stable. Final Clinical Impressions(s) / UC Diagnoses   Final diagnoses:  Cellulitis of left anterior lower leg  Open wound of left lower leg, initial encounter     Discharge Instructions       Advised patient/daughter to go to [place] ED for immediate evaluation of left leg cellulitis and worsening wound for osteomyelitis work-up and probable IV antibiotics with wound care consult.  Advised patient/daughter we have called ahead to let them know you are on the way to this facility.  Patient/daughter agreed and verbalized understanding of these instructions and this plan of care this evening.     ED Prescriptions   None    PDMP not reviewed this encounter.   Eliezer Lofts, [place] 08/04/20 1939

## 2020-08-04 NOTE — ED Triage Notes (Signed)
Pt seen in UC w/ c/o rt leg swelling and redness after an injury from a fall 1 week ago.

## 2020-08-04 NOTE — Discharge Instructions (Addendum)
Advised patient/daughter to go to [facility] ED for immediate evaluation of left leg cellulitis and worsening wound for osteomyelitis work-up and probable IV antibiotics with wound care consult.  Advised patient/daughter we have called ahead to let them know you are on the way to this facility.  Patient/daughter agreed and verbalized understanding of these instructions and this plan of care this evening.

## 2020-08-04 NOTE — ED Triage Notes (Signed)
Pt had a fall a week ago resulting in a skin tear to her right leg that has become infected. Pt has redness and swelling along with a wound to her right lower leg.

## 2020-08-04 NOTE — ED Provider Notes (Signed)
[facility] DEPT Provider Note   CSN: [id number] Arrival date & time: 08/04/20  2000     History Chief Complaint  Patient presents with   Wound Infection    Yolanda Peters is a 80 y.o. female presents to the Emergency Department complaining of gradual, persistent, progressively worsening right lower leg wound.  Patient reports she had an acute skin tear on 07/26/2020 after a fall down concrete stairs.  Reports she was seen at [facility] [place] and it was Steri-Stripped.  Since that time she has had increased swelling, redness and discomfort.  Additionally she has had purulent drainage from the site.  Denies measured fevers at home.  Patient seen at urgent care earlier today and sent here to the emergency department for IV antibiotics.  They expressed concern for cellulitis and possible osteomyelitis.  Patient reports she has been able to ambulate, is eating and drinking normally.  Palpation makes the symptoms worse.  Nothing seems to make it better.  Patient has not been placed on antibiotics at any point.  Patient is not anticoagulated.  Denies immunomodulators.   The history is provided by the patient and a relative. No language interpreter was used.      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/26/2018   Autoimmune disease ([place]) 03/15/2016   High risk medication use 03/15/2016   Pain in joint of right shoulder 03/15/2016   Bilateral hip pain 03/15/2016   Primary osteoarthritis of both knees 03/15/2016   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   Arthritis 02/24/2014   HTN (hypertension)  02/24/2014   Hyperlipidemia 02/24/2014   Breast cancer, right, upper outer quadrant  07/05/2010    Past Surgical History:  Procedure Laterality Date   BREAST DUCTAL SYSTEM EXCISION     right breast   BREAST LUMPECTOMY     CATARACT EXTRACTION     bilateral   CESAREAN SECTION     COLONOSCOPY     ELBOW SURGERY     left   JOINT REPLACEMENT     KNEE ARTHROPLASTY Left    March 2021   MYOMECTOMY     REPLACEMENT TOTAL KNEE Left 03/2019   TOTAL KNEE ARTHROPLASTY Right 06/26/2018   Procedure: TOTAL KNEE ARTHROPLASTY;  Surgeon: Paralee Cancel, MD;  Location: [facility] ORS;  Service: Orthopedics;  Laterality: Right;  70 mins     OB History   No obstetric history on file.     Family History  Problem Relation Age of Onset   Cancer Mother        bladder   Cancer Father        leukemia   Cancer Maternal Aunt        breast   Colon cancer Paternal Aunt        pt is unsure of this   Colon cancer Cousin        pt is unsure of this   Esophageal cancer Neg Hx    Rectal cancer Neg Hx    Stomach  cancer Neg Hx     Social History   Tobacco Use   Smoking status: Never   Smokeless tobacco: Never  Vaping Use   Vaping Use: Never used  Substance Use Topics   Alcohol use: Yes    Comment: occ   Drug use: No    Home Medications Prior to Admission medications   Medication Sig Start Date End Date Taking? Authorizing Provider  APPLE CIDER VINEGAR PO Take 625 mg by mouth daily.    [provider]  Ascorbic Acid (VITAMIN C) 1000 MG tablet Take 1,000 mg by mouth daily.    [provider]  beta carotene 10000 UNIT capsule Take 25,000 Units by mouth daily.    [provider]  CALCIUM & MAGNESIUM CARBONATES PO Take 2 tablets by mouth daily.    [provider]  Cholecalciferol (VITAMIN D-3 PO) Take 2,000 Units by mouth daily.    [provider]  Ginger, Zingiber officinalis, (GINGER ROOT PO) Take 200 mg by mouth daily.    [provider]   hydrochlorothiazide (HYDRODIURIL) 25 MG tablet Take 25 mg by mouth daily as needed (fluid or edema).  Patient not taking: No sig reported 07/29/10   [provider]  loratadine (CLARITIN) 10 MG tablet Take 10 mg by mouth daily. Patient not taking: Reported on 03/17/2020    [provider]  Misc Natural Products (TART CHERRY ADVANCED PO) Take 465 mg by mouth daily.  Patient not taking: No sig reported    [provider]  Multiple Vitamins-Minerals (ZINC PO) Take 50 mg by mouth daily.    [provider]  potassium chloride 20 MEQ TBCR Take 40 mEq by mouth daily for 4 days. 06/27/18 07/01/18  Danae Orleans, PA-C  Probiotic Product (PROBIOTIC DAILY PO) Take 1 capsule by mouth daily.     [provider]  verapamil (VERELAN PM) 360 MG 24 hr capsule Take 360 mg by mouth daily.  12/27/13   [provider]  vitamin E 1000 UNIT capsule Take 1,000 Units by mouth daily.    [provider]    Allergies    Penicillins and Tetracyclines & related  Review of Systems   Review of Systems  Constitutional:  Negative for appetite change, diaphoresis, fatigue, fever and unexpected weight change.  HENT:  Negative for mouth sores.   Eyes:  Negative for visual disturbance.  Respiratory:  Negative for cough, chest tightness, shortness of breath and wheezing.   Cardiovascular:  Negative for chest pain.  Gastrointestinal:  Negative for abdominal pain, constipation, diarrhea, nausea and vomiting.  Endocrine: Negative for polydipsia, polyphagia and polyuria.  Genitourinary:  Negative for dysuria, frequency, hematuria and urgency.  Musculoskeletal:  Positive for myalgias. Negative for back pain and neck stiffness.  Skin:  Positive for color change and wound. Negative for rash.  Allergic/Immunologic: Negative for immunocompromised state.  Neurological:  Negative for syncope, light-headedness and headaches.  Hematological:  Does not bruise/bleed easily.   Psychiatric/Behavioral:  Negative for sleep disturbance. The patient is not nervous/anxious.    Physical Exam Updated Vital Signs BP (!) 156/86   Pulse 94   Temp 98.5 F (36.9 C) (Oral)   Resp 20   SpO2 98%   Physical Exam Vitals and nursing note reviewed.  Constitutional:      General: She is not in acute distress.    Appearance: She is not diaphoretic.  HENT:     Head: Normocephalic.  Eyes:     General: No scleral icterus.  Conjunctiva/sclera: Conjunctivae normal.  Cardiovascular:     Rate and Rhythm: Normal rate and regular rhythm.     Pulses: Normal pulses.          Radial pulses are 2+ on the right side and 2+ on the left side.  Pulmonary:     Effort: No tachypnea, accessory muscle usage, prolonged expiration, respiratory distress or retractions.     Breath sounds: No stridor.     Comments: Equal chest rise. No increased work of breathing. Abdominal:     General: There is no distension.     Palpations: Abdomen is soft.     Tenderness: There is no abdominal tenderness. There is no guarding or rebound.  Musculoskeletal:     Cervical back: Normal range of motion.     Comments: Moves all extremities equally and without difficulty.  Skin:    General: Skin is warm and dry.     Capillary Refill: Capillary refill takes less than 2 seconds.     Comments: Large necrotic wound of the right lower leg - see photo  Neurological:     Mental Status: She is alert.     GCS: GCS eye subscore is 4. GCS verbal subscore is 5. GCS motor subscore is 6.     Comments: Speech is clear and goal oriented.  Psychiatric:        Mood and Affect: Mood normal.       ED Results / Procedures / Treatments   Labs (all labs ordered are listed, but only abnormal results are displayed) Labs Reviewed  COMPREHENSIVE METABOLIC PANEL - Abnormal; Notable for the following components:      Result Value   Glucose, Bld 108 (*)    BUN 33 (*)    GFR, Estimated 57 (*)    All other components within  normal limits  CULTURE, BLOOD (ROUTINE X 2)  CULTURE, BLOOD (ROUTINE X 2)  SARS CORONAVIRUS 2 (TAT 6-24 HRS)  LACTIC ACID, PLASMA  CBC WITH DIFFERENTIAL/PLATELET  PROTIME-INR  APTT  BRAIN NATRIURETIC PEPTIDE  LACTIC ACID, PLASMA  URINALYSIS, ROUTINE W REFLEX MICROSCOPIC    Radiology DG Tibia/Fibula Right  Result Date: 08/04/2020 CLINICAL DATA:  Initial evaluation for recent fall with recent laceration to right leg, now with infection. EXAM: RIGHT TIBIA AND FIBULA - 2 VIEW COMPARISON:  None. FINDINGS: No acute fracture or dislocation. 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 No discrete osseous lesions. Osteopenia noted. Focal soft tissue swelling noted at the lateral aspect of the right lower leg, nonspecific, but could reflect acute soft tissue injury and/or infection. No radiopaque foreign body. No dissecting soft tissue emphysema. No other visible soft tissue abnormality. Few scattered vascular calcifications noted. IMPRESSION: 1. Focal soft tissue swelling at the lateral aspect of the right lower leg, nonspecific, but could reflect focal soft tissue injury and/or infection. No radiopaque foreign body or dissecting soft tissue emphysema. 2. No acute osseous abnormality. 3. 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 Electronically Signed   By: Jeannine Boga M.D.   On: 08/04/2020 23:26   DG Chest Port 1 View  Result Date: 08/04/2020 CLINICAL DATA:  Initial evaluation for questionable sepsis. EXAM: PORTABLE CHEST 1 VIEW COMPARISON:  Prior radiograph from 08/13/2009. FINDINGS: Mild cardiomegaly. Mediastinal silhouette within normal limits. Aortic atherosclerosis. Lungs normally inflated. Mild diffuse interstitial congestion without frank alveolar edema. No pleural effusion. Hazy density at the peripheral left lung base favored to reflect atelectasis and/or congestion. Possible infiltrate difficult to exclude,  and could be considered in  the correct clinical setting. No other focal airspace disease. No pneumothorax. No acute osseous finding. Osteopenia. Degenerative changes noted about the visualized shoulders. IMPRESSION: 1. Hazy density at the peripheral left lung base, favored to reflect atelectasis and/or congestion. Possible infiltrate difficult to exclude, and could be considered in the correct clinical setting. 2. Cardiomegaly with underlying mild diffuse pulmonary interstitial congestion. 3.  Aortic Atherosclerosis (ICD10-I70.0). Electronically Signed   By: Jeannine Boga M.D.   On: 08/04/2020 23:28    Procedures Procedures   Medications Ordered in ED Medications  vancomycin (VANCOCIN) IVPB 1000 mg/200 mL premix (1,000 mg Intravenous New Bag/Given 08/05/20 0049)    ED Course  I have reviewed the triage vital signs and the nursing notes.  Pertinent labs & imaging results that were available during my care of the patient were reviewed by me and considered in my medical decision making (see chart for details).  Clinical Course as of 08/05/20 0117  Tue Aug 04, 2020  2258 Temp: 98.5 F (36.9 C) afebrile [HM]    Clinical Course User Index [HM] Zabria Liss, Gwenlyn Perking   MDM Rules/Calculators/A&P                           Patient presents with cellulitis secondary to wound on the right lower leg.  Concern for potential sepsis.  Patient will likely need admission.  Work-up pending.  1:17 AM Work overall reassuring.  No leukocytosis or elevated lactic acid.  No evidence of sepsis.  Plain films with him no evidence of free air.  Less likely to be necrotizing fasciitis.  Patient started on vancomycin.  Will be admitted.  Discussed patient's case with hospitalist, Dr. Alcario Drought.  I have recommended admission and patient (and family if present) agree with this plan. Admitting physician will place admission orders.     Final Clinical Impression(s) / ED Diagnoses Final diagnoses:  Cellulitis of right lower  extremity    Rx / DC Orders ED Discharge Orders     None        Jamise Pentland, Gwenlyn Perking 08/05/20 0120    Little, Wenda Overland, MD 08/10/20 [phone]

## 2020-08-05 DIAGNOSIS — Z806 Family history of leukemia: Secondary | ICD-10-CM | POA: Diagnosis not present

## 2020-08-05 DIAGNOSIS — Z880 Allergy status to penicillin: Secondary | ICD-10-CM | POA: Diagnosis not present

## 2020-08-05 DIAGNOSIS — L97913 Non-pressure chronic ulcer of unspecified part of right lower leg with necrosis of muscle: Secondary | ICD-10-CM | POA: Diagnosis present

## 2020-08-05 DIAGNOSIS — Z800 Family history of malignant neoplasm of digestive organs: Secondary | ICD-10-CM | POA: Diagnosis not present

## 2020-08-05 DIAGNOSIS — L03115 Cellulitis of right lower limb: Secondary | ICD-10-CM | POA: Diagnosis present

## 2020-08-05 DIAGNOSIS — I739 Peripheral vascular disease, unspecified: Secondary | ICD-10-CM | POA: Diagnosis present

## 2020-08-05 DIAGNOSIS — Z923 Personal history of irradiation: Secondary | ICD-10-CM | POA: Diagnosis not present

## 2020-08-05 DIAGNOSIS — M069 Rheumatoid arthritis, unspecified: Secondary | ICD-10-CM | POA: Diagnosis present

## 2020-08-05 DIAGNOSIS — I872 Venous insufficiency (chronic) (peripheral): Secondary | ICD-10-CM | POA: Diagnosis present

## 2020-08-05 DIAGNOSIS — I10 Essential (primary) hypertension: Secondary | ICD-10-CM | POA: Diagnosis present

## 2020-08-05 DIAGNOSIS — Z881 Allergy status to other antibiotic agents status: Secondary | ICD-10-CM | POA: Diagnosis not present

## 2020-08-05 DIAGNOSIS — I8391 Asymptomatic varicose veins of right lower extremity: Secondary | ICD-10-CM | POA: Diagnosis present

## 2020-08-05 DIAGNOSIS — W109XXA Fall (on) (from) unspecified stairs and steps, initial encounter: Secondary | ICD-10-CM | POA: Diagnosis present

## 2020-08-05 DIAGNOSIS — I878 Other specified disorders of veins: Secondary | ICD-10-CM | POA: Diagnosis present

## 2020-08-05 DIAGNOSIS — Z96653 Presence of artificial knee joint, bilateral: Secondary | ICD-10-CM | POA: Diagnosis present

## 2020-08-05 DIAGNOSIS — S81811A Laceration without foreign body, right lower leg, initial encounter: Secondary | ICD-10-CM | POA: Diagnosis present

## 2020-08-05 DIAGNOSIS — M1990 Unspecified osteoarthritis, unspecified site: Secondary | ICD-10-CM | POA: Diagnosis present

## 2020-08-05 DIAGNOSIS — Z20822 Contact with and (suspected) exposure to covid-19: Secondary | ICD-10-CM | POA: Diagnosis present

## 2020-08-05 DIAGNOSIS — Z853 Personal history of malignant neoplasm of breast: Secondary | ICD-10-CM | POA: Diagnosis not present

## 2020-08-05 DIAGNOSIS — Z79899 Other long term (current) drug therapy: Secondary | ICD-10-CM | POA: Diagnosis not present

## 2020-08-05 LAB — COMPREHENSIVE METABOLIC PANEL
ALT: 16 U/L (ref 0–44)
AST: 17 U/L (ref 15–41)
Albumin: 4 g/dL (ref 3.5–5.0)
Alkaline Phosphatase: 87 U/L (ref 38–126)
Anion gap: 11 (ref 5–15)
BUN: 33 mg/dL — ABNORMAL HIGH (ref 8–23)
CO2: 26 mmol/L (ref 22–32)
Calcium: 9.3 mg/dL (ref 8.9–10.3)
Chloride: 105 mmol/L (ref 98–111)
Creatinine, Ser: 1 mg/dL (ref 0.44–1.00)
GFR, Estimated: 57 mL/min — ABNORMAL LOW (ref >60)
Glucose, Bld: 108 mg/dL — ABNORMAL HIGH (ref 70–99)
Potassium: 4.1 mmol/L (ref 3.5–5.1)
Sodium: 142 mmol/L (ref 135–145)
Total Bilirubin: 1 mg/dL (ref 0.3–1.2)
Total Protein: 7.6 g/dL (ref 6.5–8.1)

## 2020-08-05 LAB — URINALYSIS, ROUTINE W REFLEX MICROSCOPIC
Bilirubin Urine: NEGATIVE
Glucose, UA: NEGATIVE mg/dL
Hgb urine dipstick: NEGATIVE
Ketones, ur: NEGATIVE mg/dL
Leukocytes,Ua: NEGATIVE
Nitrite: POSITIVE — AB
Protein, ur: NEGATIVE mg/dL
Specific Gravity, Urine: 1.013 (ref 1.005–1.030)
pH: 6 (ref 5.0–8.0)

## 2020-08-05 LAB — CBC WITH DIFFERENTIAL/PLATELET
Abs Immature Granulocytes: 0.04 10*3/uL (ref 0.00–0.07)
Basophils Absolute: 0 10*3/uL (ref 0.0–0.1)
Basophils Relative: 0 %
Eosinophils Absolute: 0.2 10*3/uL (ref 0.0–0.5)
Eosinophils Relative: 3 %
HCT: 39.7 % (ref 36.0–46.0)
Hemoglobin: 12.5 g/dL (ref 12.0–15.0)
Immature Granulocytes: 1 %
Lymphocytes Relative: 21 %
Lymphs Abs: 1.8 10*3/uL (ref 0.7–4.0)
MCH: 28.5 pg (ref 26.0–34.0)
MCHC: 31.5 g/dL (ref 30.0–36.0)
MCV: 90.6 fL (ref 80.0–100.0)
Monocytes Absolute: 1 10*3/uL (ref 0.1–1.0)
Monocytes Relative: 12 %
Neutro Abs: 5.6 10*3/uL (ref 1.7–7.7)
Neutrophils Relative %: 63 %
Platelets: 352 10*3/uL (ref 150–400)
RBC: 4.38 MIL/uL (ref 3.87–5.11)
RDW: 14.1 % (ref 11.5–15.5)
WBC: 8.7 10*3/uL (ref 4.0–10.5)
nRBC: 0 % (ref 0.0–0.2)

## 2020-08-05 LAB — CBC
HCT: 38 % (ref 36.0–46.0)
Hemoglobin: 12.2 g/dL (ref 12.0–15.0)
MCH: 28.8 pg (ref 26.0–34.0)
MCHC: 32.1 g/dL (ref 30.0–36.0)
MCV: 89.6 fL (ref 80.0–100.0)
Platelets: 298 10*3/uL (ref 150–400)
RBC: 4.24 MIL/uL (ref 3.87–5.11)
RDW: 14 % (ref 11.5–15.5)
WBC: 7 10*3/uL (ref 4.0–10.5)
nRBC: 0 % (ref 0.0–0.2)

## 2020-08-05 LAB — BASIC METABOLIC PANEL
Anion gap: 13 (ref 5–15)
BUN: 22 mg/dL (ref 8–23)
CO2: 26 mmol/L (ref 22–32)
Calcium: 8.9 mg/dL (ref 8.9–10.3)
Chloride: 102 mmol/L (ref 98–111)
Creatinine, Ser: 0.6 mg/dL (ref 0.44–1.00)
GFR, Estimated: >60 mL/min (ref >60)
Glucose, Bld: 94 mg/dL (ref 70–99)
Potassium: 3.6 mmol/L (ref 3.5–5.1)
Sodium: 141 mmol/L (ref 135–145)

## 2020-08-05 LAB — MRSA NEXT GEN BY PCR, NASAL: MRSA by PCR Next Gen: NOT DETECTED

## 2020-08-05 LAB — LACTIC ACID, PLASMA: Lactic Acid, Venous: 1.1 mmol/L (ref 0.5–1.9)

## 2020-08-05 LAB — PROTIME-INR
INR: 1 (ref 0.8–1.2)
Prothrombin Time: 12.7 seconds (ref 11.4–15.2)

## 2020-08-05 LAB — BRAIN NATRIURETIC PEPTIDE: B Natriuretic Peptide: 61.8 pg/mL (ref 0.0–100.0)

## 2020-08-05 LAB — SARS CORONAVIRUS 2 (TAT 6-24 HRS): SARS Coronavirus 2: NEGATIVE

## 2020-08-05 LAB — APTT: aPTT: 33 seconds (ref 24–36)

## 2020-08-05 LAB — SURGICAL PCR SCREEN
MRSA, PCR: NEGATIVE
Staphylococcus aureus: NEGATIVE

## 2020-08-05 MED ORDER — ONDANSETRON HCL 4 MG/2ML IJ SOLN
4.0000 mg | Freq: Four times a day (QID) | INTRAMUSCULAR | Status: DC | PRN
  Administered 2020-08-06: 4 mg via INTRAVENOUS

## 2020-08-05 MED ORDER — ACETAMINOPHEN 325 MG PO TABS
650.0000 mg | ORAL_TABLET | Freq: Four times a day (QID) | ORAL | Status: DC | PRN
  Administered 2020-08-08 – 2020-08-09 (×3): 650 mg via ORAL
  Filled 2020-08-05 (×4): qty 2

## 2020-08-05 MED ORDER — SODIUM CHLORIDE 0.9 % IV SOLN
2.0000 g | INTRAVENOUS | Status: AC
  Administered 2020-08-06 – 2020-08-11 (×6): 2 g via INTRAVENOUS
  Filled 2020-08-05 (×2): qty 20
  Filled 2020-08-05: qty 2
  Filled 2020-08-05 (×2): qty 20
  Filled 2020-08-05: qty 2
  Filled 2020-08-05: qty 20

## 2020-08-05 MED ORDER — VANCOMYCIN HCL IN DEXTROSE 1-5 GM/200ML-% IV SOLN
1000.0000 mg | Freq: Once | INTRAVENOUS | Status: AC
  Administered 2020-08-05: 1000 mg via INTRAVENOUS
  Filled 2020-08-05: qty 200

## 2020-08-05 MED ORDER — SODIUM CHLORIDE 0.9 % IV SOLN
1.0000 g | INTRAVENOUS | Status: DC
  Administered 2020-08-05: 1 g via INTRAVENOUS
  Filled 2020-08-05: qty 10

## 2020-08-05 MED ORDER — VANCOMYCIN HCL IN DEXTROSE 1-5 GM/200ML-% IV SOLN
1000.0000 mg | INTRAVENOUS | Status: DC
  Administered 2020-08-05: 1000 mg via INTRAVENOUS
  Filled 2020-08-05: qty 200

## 2020-08-05 MED ORDER — VERAPAMIL HCL ER 180 MG PO TBCR
360.0000 mg | EXTENDED_RELEASE_TABLET | Freq: Every day | ORAL | Status: DC
  Administered 2020-08-05 – 2020-08-11 (×7): 360 mg via ORAL
  Filled 2020-08-05 (×7): qty 2

## 2020-08-05 MED ORDER — ENOXAPARIN SODIUM 40 MG/0.4ML IJ SOSY
40.0000 mg | PREFILLED_SYRINGE | INTRAMUSCULAR | Status: DC
  Administered 2020-08-05 – 2020-08-11 (×6): 40 mg via SUBCUTANEOUS
  Filled 2020-08-05 (×6): qty 0.4

## 2020-08-05 MED ORDER — ACETAMINOPHEN 650 MG RE SUPP: 650.0000 mg | Freq: Four times a day (QID) | RECTAL | Status: DC | PRN

## 2020-08-05 MED ORDER — ONDANSETRON HCL 4 MG PO TABS: 4.0000 mg | ORAL_TABLET | Freq: Four times a day (QID) | ORAL | Status: DC | PRN

## 2020-08-05 NOTE — H&P (Signed)
History and Physical    Yolanda Peters [id number] DOB: 1940-10-14 DOA: 08/04/2020  PCP: Shon Baton, MD  Patient coming from: Home  I have personally briefly reviewed patient's old medical records in [place]  Chief Complaint: Wound infection  HPI: Yolanda Peters is a 80 y.o. female with medical history significant of HTN, RA, breast CA in remission.  Pt suffered a skin lac after fall down concrete stairs on 07/26/20.  Pt was seen in ED at that time.  Steri-strips placed.  After discharge, wound became increasingly erythematous with purulent drainage, swelling and even necrosis.  Not on any ABx for this prior to today.  Pt seen at [facility] today who sent her in to the ED.  Pain worse with palpation.  Pt not anticoagulated.  Pt not on any immunosuppressives for RA.   ED Course: Pt given dose of IV vanc, hospitalist asked to admit.   Review of Systems: As per HPI, otherwise all review of systems negative.  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, skin cnacer   Cancer ([place])    right breast    Hyperlipidemia    no meds taken   Hypertension    Personal history of radiation therapy 2011   rt breast    Past Surgical History:  Procedure Laterality Date   BREAST DUCTAL SYSTEM EXCISION     right breast   BREAST LUMPECTOMY     CATARACT EXTRACTION     bilateral   CESAREAN SECTION     COLONOSCOPY     ELBOW SURGERY     left   JOINT REPLACEMENT     KNEE ARTHROPLASTY Left    March 2021   MYOMECTOMY     REPLACEMENT TOTAL KNEE Left 03/2019   TOTAL KNEE ARTHROPLASTY Right 06/26/2018   Procedure: TOTAL KNEE ARTHROPLASTY;  Surgeon: Paralee Cancel, MD;  Location: [facility] ORS;  Service: Orthopedics;  Laterality: Right;  70 mins     reports that she has never smoked. She has never used smokeless tobacco. She reports current alcohol use. She reports that she does not use drugs.  Allergies  Allergen Reactions    Penicillins Nausea Only and Swelling    Fever Did it involve swelling of the face/tongue/throat, SOB, or low BP? Unknown Did it involve sudden or severe rash/hives, skin peeling, or any reaction on the inside of your mouth or nose? No Did you need to seek medical attention at a hospital or doctor's office? Yes When did it last happen?      60+ years ago If all above answers are "NO", may proceed with cephalosporin use.    Tetracyclines & Related Nausea Only    Family History  Problem Relation Age of Onset   Cancer Mother        bladder   Cancer Father        leukemia   Cancer Maternal Aunt        breast   Colon cancer Paternal Aunt        pt is unsure of this   Colon cancer Cousin        pt is unsure of this   Esophageal cancer Neg Hx    Rectal cancer Neg Hx    Stomach cancer Neg Hx      Prior to Admission medications   Medication Sig Start Date End Date Taking? Authorizing Provider  APPLE CIDER  VINEGAR PO Take 625 mg by mouth daily.    [provider]  Ascorbic Acid (VITAMIN C) 1000 MG tablet Take 1,000 mg by mouth daily.    [provider]  beta carotene 10000 UNIT capsule Take 25,000 Units by mouth daily.    [provider]  CALCIUM & MAGNESIUM CARBONATES PO Take 2 tablets by mouth daily.    [provider]  Cholecalciferol (VITAMIN D-3 PO) Take 2,000 Units by mouth daily.    [provider]  Ginger, Zingiber officinalis, (GINGER ROOT PO) Take 200 mg by mouth daily.    [provider]  hydrochlorothiazide (HYDRODIURIL) 25 MG tablet Take 25 mg by mouth daily as needed (fluid or edema).  Patient not taking: No sig reported 07/29/10   [provider]  loratadine (CLARITIN) 10 MG tablet Take 10 mg by mouth daily. Patient not taking: Reported on 03/17/2020    [provider]  Misc Natural Products (TART CHERRY ADVANCED PO) Take 465 mg by mouth daily.  Patient not taking: No sig reported    [provider]  Multiple Vitamins-Minerals (ZINC PO) Take 50 mg by mouth daily.    [provider]  potassium chloride 20 MEQ TBCR Take 40 mEq by mouth daily for 4 days. 06/27/18 07/01/18  Danae Orleans, PA-C  Probiotic Product (PROBIOTIC DAILY PO) Take 1 capsule by mouth daily.     [provider]  verapamil (VERELAN PM) 360 MG 24 hr capsule Take 360 mg by mouth daily.  12/27/13   [provider]  vitamin E 1000 UNIT capsule Take 1,000 Units by mouth daily.    [provider]    Physical Exam: Vitals:   08/05/20 0200 08/05/20 0230 08/05/20 0300 08/05/20 0330  BP: 127/63 113/65 125/67 121/66  Pulse: 84 83 83 85  Resp: (!) '22 19 17 14  '$ Temp:      TempSrc:      SpO2: 96% 94% 94% 95%    Constitutional: NAD, calm, comfortable Eyes: PERRL, lids and conjunctivae normal ENMT: Mucous membranes are moist. Posterior pharynx clear of any exudate or lesions.Normal dentition.  Neck: normal, supple, no masses, no thyromegaly Respiratory: clear to auscultation bilaterally, no wheezing, no crackles. Normal respiratory effort. No accessory muscle use.  Cardiovascular: Regular rate and rhythm, no murmurs / rubs / gallops. No extremity edema. 2+ pedal pulses. No carotid bruits.  Abdomen: no tenderness, no masses palpated. No hepatosplenomegaly. Bowel sounds positive.  Musculoskeletal: Finger joint destruction noted. Skin:    Neurologic: CN 2-12 grossly intact. Sensation intact, DTR normal. Strength 5/5 in all 4.  Psychiatric: Normal judgment and insight. Alert and oriented x 3. Normal mood.    Labs on Admission: I have personally reviewed following labs and imaging studies  CBC: Recent Labs  Lab 08/05/20 0004  WBC 8.7  NEUTROABS 5.6  HGB 12.5  HCT 39.7  MCV 90.6  PLT [id number]   Basic Metabolic Panel: Recent Labs  Lab 08/05/20 0004  NA 142  K 4.1  CL 105  CO2 26  GLUCOSE 108*  BUN 33*  CREATININE 1.00  CALCIUM 9.3   GFR: Estimated Creatinine  Clearance: 43.9 mL/min (by C-G formula based on SCr of 1 mg/dL). Liver Function Tests: Recent Labs  Lab 08/05/20 0004  AST 17  ALT 16  ALKPHOS 87  BILITOT 1.0  PROT 7.6  ALBUMIN 4.0   No results for input(s): LIPASE, AMYLASE in the last 168 hours. No results for input(s): AMMONIA in the  last 168 hours. Coagulation Profile: Recent Labs  Lab 08/05/20 0004  INR 1.0   Cardiac Enzymes: No results for input(s): CKTOTAL, CKMB, CKMBINDEX, TROPONINI in the last 168 hours. BNP (last 3 results) No results for input(s): PROBNP in the last 8760 hours. HbA1C: No results for input(s): HGBA1C in the last 72 hours. CBG: No results for input(s): GLUCAP in the last 168 hours. Lipid Profile: No results for input(s): CHOL, HDL, LDLCALC, TRIG, CHOLHDL, LDLDIRECT in the last 72 hours. Thyroid Function Tests: No results for input(s): TSH, T4TOTAL, FREET4, T3FREE, THYROIDAB in the last 72 hours. Anemia Panel: No results for input(s): VITAMINB12, FOLATE, FERRITIN, TIBC, IRON, RETICCTPCT in the last 72 hours. Urine analysis:    Component Value Date/Time   COLORURINE DARK YELLOW 03/09/2017 1435   APPEARANCEUR TURBID (A) 03/09/2017 1435   LABSPEC 1.031 03/09/2017 1435   PHURINE 5.5 03/09/2017 1435   GLUCOSEU NEGATIVE 03/09/2017 1435   HGBUR NEGATIVE 03/09/2017 1435   KETONESUR TRACE (A) 03/09/2017 1435   PROTEINUR TRACE (A) 03/09/2017 1435   NITRITE NEGATIVE 03/09/2017 1435   LEUKOCYTESUR NEGATIVE 03/09/2017 1435    Radiological Exams on Admission: DG Tibia/Fibula Right  Result Date: 08/04/2020 CLINICAL DATA:  Initial evaluation for recent fall with recent laceration to right leg, now with infection. EXAM: RIGHT TIBIA AND FIBULA - 2 VIEW COMPARISON:  None. FINDINGS: No acute fracture or dislocation. 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 No discrete osseous lesions. Osteopenia noted. Focal soft tissue swelling noted at the lateral aspect of the right lower leg,  nonspecific, but could reflect acute soft tissue injury and/or infection. No radiopaque foreign body. No dissecting soft tissue emphysema. No other visible soft tissue abnormality. Few scattered vascular calcifications noted. IMPRESSION: 1. Focal soft tissue swelling at the lateral aspect of the right lower leg, nonspecific, but could reflect focal soft tissue injury and/or infection. No radiopaque foreign body or dissecting soft tissue emphysema. 2. No acute osseous abnormality. 3. 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 Electronically Signed   By: Jeannine Boga M.D.   On: 08/04/2020 23:26   DG Chest Port 1 View  Result Date: 08/04/2020 CLINICAL DATA:  Initial evaluation for questionable sepsis. EXAM: PORTABLE CHEST 1 VIEW COMPARISON:  Prior radiograph from 08/13/2009. FINDINGS: Mild cardiomegaly. Mediastinal silhouette within normal limits. Aortic atherosclerosis. Lungs normally inflated. Mild diffuse interstitial congestion without frank alveolar edema. No pleural effusion. Hazy density at the peripheral left lung base favored to reflect atelectasis and/or congestion. Possible infiltrate difficult to exclude, and could be considered in the correct clinical setting. No other focal airspace disease. No pneumothorax. No acute osseous finding. Osteopenia. Degenerative changes noted about the visualized shoulders. IMPRESSION: 1. Hazy density at the peripheral left lung base, favored to reflect atelectasis and/or congestion. Possible infiltrate difficult to exclude, and could be considered in the correct clinical setting. 2. Cardiomegaly with underlying mild diffuse pulmonary interstitial congestion. 3.  Aortic Atherosclerosis (ICD10-I70.0). Electronically Signed   By: Jeannine Boga M.D.   On: 08/04/2020 23:28    EKG: Independently reviewed.  Assessment/Plan Principal Problem:   Cellulitis of right leg Active Problems:   RA (rheumatoid arthritis) (HCC)   HTN  (hypertension)    Cellulitis and wound of R leg - Cellulitis pathway Rocephin Got dose of vanc in ED Check MRSA PCR nares, reorder vanc if positive Wound care consult May need surgical consult for debridement / I+D. RA - Pt not on any immunosuppressives Pt does follow yearly with a  rheumatologist HTN - Med rec still pending, plan to continue home meds  DVT prophylaxis: Lovenox Code Status: Full Family Communication: No family in room Disposition Plan: Home after wound improved and cellulitis improved Consults called: None Admission status: Place in obs     Mattie Novosel, [place] [facility]  How to contact the [facility] Attending or Consulting provider [place] or covering provider during after hours [place], for this patient?  Check the care team in [facility] and look for a) attending/consulting [facility] provider listed and b) the [facility] team listed Log into [email or web address]  Amion Physician Scheduling and messaging for groups and whole hospitals  On call and physician scheduling software for group practices, residents, hospitalists and other medical providers for call, clinic, rotation and shift schedules. OnCall [employer] is a hospital-wide system for scheduling doctors and paging doctors on call. EasyPlot is for scientific plotting and data analysis.  [email or web address]  and use [place]'s universal password to access. If you do not have the password, please contact the hospital operator.  Locate the [facility] provider you are looking for under [facility] and page to a number that you can be directly reached. If you still have difficulty reaching the provider, please page the [facility] (Director on Call) for the Hospitalists listed on amion for assistance.  08/05/2020, 5:05 AM

## 2020-08-05 NOTE — Consult Note (Signed)
[place] Nurse wound consult note Consultation was completed by review of records, images and assistance from the bedside nurse/clinical staff.  Reason for Consult: cellulitis with wound Patient sustained fall, large skin tear with avulsion that has been steri stripped in the ED and now back with cellulitis, skin necrosis, purulent drainage and edema. Would is full thickness at this point Wound type: trauma Pressure Injury POA: NA Measurement: see nursing flow sheets once admitted to the floor  Wound bed: 100% non viable tissue; distal leg wound also with 50% black tissue Drainage (amount, consistency, odor) noted to be moderate and purulent  Periwound: erythema and edema  Dressing procedure/placement/frequency: Recommended surgical consultation for debridement; topical care is futile until necrotic tissue has been removed.  Will order non adherent dressing for now for comfort until surgical team assessment.  Single layer of xeroform over wounds, top with dry dressings, secure with kerlix.     Re consult if needed, will not follow at this time. Thanks  Reyli Schroth [employer], RN,CWOCN, CNS, [place] [phone])

## 2020-08-05 NOTE — Progress Notes (Addendum)
Care started prior to [place] in the ED and the patient was admitted early this morning after midnight by Dr. Jennette Kettle and I am in current agreement with his assessment and plan.  Additional changes to the plan of care been made accordingly.  The patient is a 80 year old female with a past medical history significant for but not limited to hypertension, rheumatoid arthritis, history of breast cancer currently in remission who suffered a skin laceration after a fall down concrete stairs on 07/26/2020.  She was seen in the ED at that time and Steri-Strips were placed.  After her discharge the wound became increasingly erythematous with purulent drainage, swelling and even necrosis.  She not been on any antibiotics for this prior to today.  She went to the urgent care who sent her to the ED.  She noted that the pain was worse with palpation and she is currently not on anticoagulations and currently not on any immunosuppressants for her rheumatoid arthritis.  Hospitalist was asked to admit this patient in and wound care nurse was consulted for further evaluation and recommended surgical debridement.  We have consulted orthopedic surgery currently awaiting callback.  Currently she is being admitted and treated for the following but not limited to:  Cellulitis and Wound of R leg  -Wound Care Consulted -Started IV Ceftriaxone  -Got dose of vanc in ED and will resume for now and Check MRSA PCR nares -Orthopedic Surgery consulted for Debridement  Rheumatoid Arthritis  -Pt not on any immunosuppressives -Pt does follow yearly with a rheumatologist  HTN  -BP was elevated at 145/82 this AM  -C/w Verapamil 360 mg po daily  -Continue to Monitor BP per Protocol  We will continue to monitor patient's clinical response to intervention and repeat blood work in the a.m. and follow-up on specialist recommendations

## 2020-08-05 NOTE — Progress Notes (Signed)
Called [facility] for transport and they are here to transport her to [facility]. Gave report to Hulan Amato, RN on [place]. Printed out required paperwork for transport.

## 2020-08-05 NOTE — Progress Notes (Signed)
Pharmacy Antibiotic Note  Yolanda Peters is a 80 y.o. female admitted on 08/04/2020 with cellulitis.  Pharmacy has been consulted for Vancomycin dosing.  Plan: Ceftriaxone per MD Vancomycin 1g IV given in ED, then 1g IV q24h.  (SCr rounded 1, est AUC 531) Measure Vanc peak and trough at steady state.  Goal AUC = 400 - 550.   Follow up renal function, culture results, and clinical course.   Height: '5\' 4"'$  (162.6 cm) Weight: 70.4 kg (155 lb 3.3 oz) IBW/kg (Calculated) : 54.7  Temp (24hrs), Avg:98.1 F (36.7 C), Min:97.6 F (36.4 C), Max:98.5 F (36.9 C)  Recent Labs  Lab 08/05/20 0004 08/05/20 0703  WBC 8.7 7.0  CREATININE 1.00 0.60  LATICACIDVEN 1.1  --     Estimated Creatinine Clearance: 54.9 mL/min (by C-G formula based on SCr of 0.6 mg/dL).    Allergies  Allergen Reactions   Penicillins Nausea Only and Swelling    Fever Did it involve swelling of the face/tongue/throat, SOB, or low BP? Unknown Did it involve sudden or severe rash/hives, skin peeling, or any reaction on the inside of your mouth or nose? No Did you need to seek medical attention at a hospital or doctor's office? Yes When did it last happen?      60+ years ago If all above answers are "NO", may proceed with cephalosporin use.    Tetracyclines & Related Nausea Only    Antimicrobials this admission: 7/27 Vanc >>  7/27 Ceftriaxone >> (8/3)  Dose adjustments this admission:   Microbiology results: 7/27 BCx:   Thank you for allowing pharmacy to be a part of this patient's care.  Gretta Arab PharmD, BCPS Clinical Pharmacist [facility] main pharmacy [phone] 08/05/2020 1:01 PM

## 2020-08-06 ENCOUNTER — Encounter (HOSPITAL_COMMUNITY)
Admission: EM | Disposition: A | Discharge status: Home Health | Payer: Self-pay | Source: Home / Self Care | Attending: Internal Medicine

## 2020-08-06 ENCOUNTER — Inpatient Hospital Stay (HOSPITAL_COMMUNITY): Payer: Medicare Other | Admitting: Anesthesiology

## 2020-08-06 ENCOUNTER — Encounter (HOSPITAL_COMMUNITY): Payer: Self-pay | Admitting: Internal Medicine

## 2020-08-06 DIAGNOSIS — L97913 Non-pressure chronic ulcer of unspecified part of right lower leg with necrosis of muscle: Secondary | ICD-10-CM

## 2020-08-06 DIAGNOSIS — L03115 Cellulitis of right lower limb: Principal | ICD-10-CM

## 2020-08-06 DIAGNOSIS — I10 Essential (primary) hypertension: Secondary | ICD-10-CM | POA: Diagnosis not present

## 2020-08-06 DIAGNOSIS — M069 Rheumatoid arthritis, unspecified: Secondary | ICD-10-CM

## 2020-08-06 HISTORY — PX: I & D EXTREMITY: SHX5045

## 2020-08-06 SURGERY — IRRIGATION AND DEBRIDEMENT EXTREMITY
Anesthesia: General | Laterality: Right

## 2020-08-06 MED ORDER — OXYCODONE HCL 5 MG/5ML PO SOLN
5.0000 mg | Freq: Once | ORAL | Status: DC | PRN
Start: 2020-08-06 — End: 2020-08-06

## 2020-08-06 MED ORDER — SODIUM CHLORIDE 0.9 % IV SOLN: INTRAVENOUS | Status: DC

## 2020-08-06 MED ORDER — CHLORHEXIDINE GLUCONATE 0.12 % MT SOLN: 15.0000 mL | Freq: Once | OROMUCOSAL | Status: AC

## 2020-08-06 MED ORDER — METHOCARBAMOL 1000 MG/10ML IJ SOLN
500.0000 mg | Freq: Four times a day (QID) | INTRAVENOUS | Status: DC | PRN
  Filled 2020-08-06 (×3): qty 5

## 2020-08-06 MED ORDER — BISACODYL 10 MG RE SUPP: 10.0000 mg | Freq: Every day | RECTAL | Status: DC | PRN

## 2020-08-06 MED ORDER — METOCLOPRAMIDE HCL 5 MG PO TABS: 5.0000 mg | ORAL_TABLET | Freq: Three times a day (TID) | ORAL | Status: DC | PRN

## 2020-08-06 MED ORDER — FENTANYL CITRATE (PF) 250 MCG/5ML IJ SOLN
INTRAMUSCULAR | Status: DC | PRN
  Administered 2020-08-06: 100 ug via INTRAVENOUS

## 2020-08-06 MED ORDER — MORPHINE SULFATE (PF) 2 MG/ML IV SOLN: 0.5000 mg | INTRAVENOUS | Status: DC | PRN

## 2020-08-06 MED ORDER — PHENYLEPHRINE 40 MCG/ML (10ML) SYRINGE FOR IV PUSH (FOR BLOOD PRESSURE SUPPORT)
PREFILLED_SYRINGE | INTRAVENOUS | Status: DC | PRN
  Administered 2020-08-06 (×2): 80 ug via INTRAVENOUS

## 2020-08-06 MED ORDER — CEFAZOLIN SODIUM-DEXTROSE 2-4 GM/100ML-% IV SOLN
INTRAVENOUS | Status: AC
  Filled 2020-08-06: qty 100

## 2020-08-06 MED ORDER — POVIDONE-IODINE 10 % EX SWAB
2.0000 "application " | Freq: Once | CUTANEOUS | Status: AC
  Administered 2020-08-06: 2 via TOPICAL

## 2020-08-06 MED ORDER — OXYCODONE HCL 5 MG PO TABS: 5.0000 mg | ORAL_TABLET | Freq: Once | ORAL | Status: DC | PRN

## 2020-08-06 MED ORDER — 0.9 % SODIUM CHLORIDE (POUR BTL) OPTIME
TOPICAL | Status: DC | PRN
  Administered 2020-08-06: 1000 mL

## 2020-08-06 MED ORDER — CEFAZOLIN SODIUM-DEXTROSE 2-4 GM/100ML-% IV SOLN
2.0000 g | INTRAVENOUS | Status: AC
  Administered 2020-08-06: 2 g via INTRAVENOUS

## 2020-08-06 MED ORDER — HYDROMORPHONE HCL 1 MG/ML IJ SOLN
INTRAMUSCULAR | Status: AC
  Filled 2020-08-06: qty 1

## 2020-08-06 MED ORDER — ONDANSETRON HCL 4 MG PO TABS: 4.0000 mg | ORAL_TABLET | Freq: Four times a day (QID) | ORAL | Status: DC | PRN

## 2020-08-06 MED ORDER — ONDANSETRON HCL 4 MG/2ML IJ SOLN: 4.0000 mg | Freq: Four times a day (QID) | INTRAMUSCULAR | Status: DC | PRN

## 2020-08-06 MED ORDER — VITAMIN D 25 MCG (1000 UNIT) PO TABS
2000.0000 [IU] | ORAL_TABLET | Freq: Every day | ORAL | Status: DC
  Administered 2020-08-07 – 2020-08-11 (×5): 2000 [IU] via ORAL
  Filled 2020-08-06 (×5): qty 2

## 2020-08-06 MED ORDER — VITAMIN E 45 MG (100 UNIT) PO CAPS
1000.0000 [IU] | ORAL_CAPSULE | Freq: Every day | ORAL | Status: DC
  Administered 2020-08-07 – 2020-08-11 (×5): 1000 [IU] via ORAL
  Filled 2020-08-06 (×6): qty 10

## 2020-08-06 MED ORDER — HYDROMORPHONE HCL 1 MG/ML IJ SOLN
0.2500 mg | INTRAMUSCULAR | Status: DC | PRN
  Administered 2020-08-06 (×3): 0.25 mg via INTRAVENOUS

## 2020-08-06 MED ORDER — ASCORBIC ACID 500 MG PO TABS
1000.0000 mg | ORAL_TABLET | Freq: Every day | ORAL | Status: DC
  Administered 2020-08-07 – 2020-08-11 (×5): 1000 mg via ORAL
  Filled 2020-08-06 (×5): qty 2

## 2020-08-06 MED ORDER — ACETAMINOPHEN 325 MG PO TABS
325.0000 mg | ORAL_TABLET | Freq: Four times a day (QID) | ORAL | Status: DC | PRN
  Administered 2020-08-09: 650 mg via ORAL

## 2020-08-06 MED ORDER — TRANEXAMIC ACID-NACL 1000-0.7 MG/100ML-% IV SOLN
INTRAVENOUS | Status: AC
  Filled 2020-08-06: qty 100

## 2020-08-06 MED ORDER — METHOCARBAMOL 500 MG PO TABS: 500.0000 mg | ORAL_TABLET | Freq: Four times a day (QID) | ORAL | Status: DC | PRN

## 2020-08-06 MED ORDER — DEXAMETHASONE SODIUM PHOSPHATE 10 MG/ML IJ SOLN
INTRAMUSCULAR | Status: DC | PRN
  Administered 2020-08-06: 4 mg via INTRAVENOUS

## 2020-08-06 MED ORDER — VITAMIN A 3 MG (10000 UNIT) PO CAPS
20000.0000 [IU] | ORAL_CAPSULE | Freq: Every day | ORAL | Status: DC
  Administered 2020-08-07 – 2020-08-11 (×5): 20000 [IU] via ORAL
  Filled 2020-08-06 (×6): qty 2

## 2020-08-06 MED ORDER — HYDROCODONE-ACETAMINOPHEN 7.5-325 MG PO TABS: 1.0000 | ORAL_TABLET | ORAL | Status: DC | PRN

## 2020-08-06 MED ORDER — VANCOMYCIN HCL 1250 MG/250ML IV SOLN
1250.0000 mg | INTRAVENOUS | Status: DC
  Administered 2020-08-06 – 2020-08-10 (×5): 1250 mg via INTRAVENOUS
  Filled 2020-08-06 (×7): qty 250

## 2020-08-06 MED ORDER — MIDAZOLAM HCL 2 MG/2ML IJ SOLN: 0.5000 mg | Freq: Once | INTRAMUSCULAR | Status: DC | PRN

## 2020-08-06 MED ORDER — CHLORHEXIDINE GLUCONATE 4 % EX LIQD: 60.0000 mL | Freq: Once | CUTANEOUS | Status: DC

## 2020-08-06 MED ORDER — METOCLOPRAMIDE HCL 5 MG/ML IJ SOLN: 5.0000 mg | Freq: Three times a day (TID) | INTRAMUSCULAR | Status: DC | PRN

## 2020-08-06 MED ORDER — POLYETHYLENE GLYCOL 3350 17 G PO PACK: 17.0000 g | PACK | Freq: Every day | ORAL | Status: DC | PRN

## 2020-08-06 MED ORDER — FENTANYL CITRATE (PF) 250 MCG/5ML IJ SOLN
INTRAMUSCULAR | Status: AC
  Filled 2020-08-06: qty 5

## 2020-08-06 MED ORDER — ORAL CARE MOUTH RINSE: 15.0000 mL | Freq: Once | OROMUCOSAL | Status: AC

## 2020-08-06 MED ORDER — LACTATED RINGERS IV SOLN: INTRAVENOUS | Status: DC

## 2020-08-06 MED ORDER — DOCUSATE SODIUM 100 MG PO CAPS
100.0000 mg | ORAL_CAPSULE | Freq: Two times a day (BID) | ORAL | Status: DC
  Administered 2020-08-06 – 2020-08-11 (×5): 100 mg via ORAL
  Filled 2020-08-06 (×10): qty 1

## 2020-08-06 MED ORDER — PROPOFOL 10 MG/ML IV BOLUS
INTRAVENOUS | Status: DC | PRN
  Administered 2020-08-06: 100 mg via INTRAVENOUS
  Administered 2020-08-06: 70 mg via INTRAVENOUS

## 2020-08-06 MED ORDER — PROMETHAZINE HCL 25 MG/ML IJ SOLN: 6.2500 mg | INTRAMUSCULAR | Status: DC | PRN

## 2020-08-06 MED ORDER — CHLORHEXIDINE GLUCONATE 0.12 % MT SOLN
OROMUCOSAL | Status: AC
  Administered 2020-08-06: 15 mL
  Filled 2020-08-06: qty 15

## 2020-08-06 MED ORDER — HYDROCODONE-ACETAMINOPHEN 5-325 MG PO TABS
1.0000 | ORAL_TABLET | ORAL | Status: DC | PRN
  Administered 2020-08-06: 1 via ORAL
  Filled 2020-08-06: qty 1

## 2020-08-06 MED ORDER — BETA CAROTENE 10000 UNITS PO CAPS: 25000.0000 [IU] | ORAL_CAPSULE | Freq: Every day | ORAL | Status: DC

## 2020-08-06 MED ORDER — LIDOCAINE 2% (20 MG/ML) 5 ML SYRINGE
INTRAMUSCULAR | Status: DC | PRN
Start: 2020-08-06 — End: 2020-08-06
  Administered 2020-08-06: 40 mg via INTRAVENOUS

## 2020-08-06 MED ORDER — TRIMETHOPRIM 100 MG PO TABS
100.0000 mg | ORAL_TABLET | Freq: Every day | ORAL | Status: DC
  Administered 2020-08-07 – 2020-08-11 (×5): 100 mg via ORAL
  Filled 2020-08-06 (×6): qty 1

## 2020-08-06 SURGICAL SUPPLY — 36 items
BAG COUNTER SPONGE SURGICOUNT (BAG) IMPLANT
BLADE SURG 21 STRL SS (BLADE) ×2 IMPLANT
BNDG COHESIVE 6X5 TAN STRL LF (GAUZE/BANDAGES/DRESSINGS) IMPLANT
BNDG GAUZE ELAST 4 BULKY (GAUZE/BANDAGES/DRESSINGS) ×4 IMPLANT
CANISTER WOUND CARE 500ML ATS (WOUND CARE) ×2 IMPLANT
CASSETTE VERAFLO VERALINK (MISCELLANEOUS) ×2 IMPLANT
COVER SURGICAL LIGHT HANDLE (MISCELLANEOUS) ×4 IMPLANT
DRAPE DERMATAC (DRAPES) ×4 IMPLANT
DRAPE U-SHAPE 47X51 STRL (DRAPES) ×2 IMPLANT
DRESSING VERAFLO CLEANSE CC (GAUZE/BANDAGES/DRESSINGS) ×1 IMPLANT
DRSG ADAPTIC 3X8 NADH LF (GAUZE/BANDAGES/DRESSINGS) ×2 IMPLANT
DRSG VERAFLO CLEANSE CC (GAUZE/BANDAGES/DRESSINGS) ×2
DURAPREP 26ML APPLICATOR (WOUND CARE) ×2 IMPLANT
ELECT REM PT RETURN 9FT ADLT (ELECTROSURGICAL)
ELECTRODE REM PT RTRN 9FT ADLT (ELECTROSURGICAL) IMPLANT
GAUZE SPONGE 4X4 12PLY STRL (GAUZE/BANDAGES/DRESSINGS) ×2 IMPLANT
GLOVE SURG ORTHO LTX SZ9 (GLOVE) ×2 IMPLANT
GLOVE SURG UNDER POLY LF SZ9 (GLOVE) ×2 IMPLANT
GOWN STRL REUS W/ TWL XL LVL3 (GOWN DISPOSABLE) ×2 IMPLANT
GOWN STRL REUS W/TWL XL LVL3 (GOWN DISPOSABLE) ×4
HANDPIECE INTERPULSE COAX TIP (DISPOSABLE)
KIT BASIN OR (CUSTOM PROCEDURE TRAY) ×2 IMPLANT
KIT TURNOVER KIT B (KITS) ×2 IMPLANT
MANIFOLD NEPTUNE II (INSTRUMENTS) ×2 IMPLANT
NS IRRIG 1000ML POUR BTL (IV SOLUTION) ×2 IMPLANT
PACK ORTHO EXTREMITY (CUSTOM PROCEDURE TRAY) ×2 IMPLANT
PAD ARMBOARD 7.5X6 YLW CONV (MISCELLANEOUS) ×4 IMPLANT
SET HNDPC FAN SPRY TIP SCT (DISPOSABLE) IMPLANT
SPONGE T-LAP 18X18 ~~LOC~~+RFID (SPONGE) ×2 IMPLANT
STOCKINETTE IMPERVIOUS 9X36 MD (GAUZE/BANDAGES/DRESSINGS) IMPLANT
SUT ETHILON 2 0 PSLX (SUTURE) ×2 IMPLANT
SWAB COLLECTION DEVICE MRSA (MISCELLANEOUS) ×2 IMPLANT
SWAB CULTURE ESWAB REG 1ML (MISCELLANEOUS) IMPLANT
TOWEL GREEN STERILE (TOWEL DISPOSABLE) ×2 IMPLANT
TUBE CONNECTING 12X1/4 (SUCTIONS) ×2 IMPLANT
YANKAUER SUCT BULB TIP NO VENT (SUCTIONS) ×2 IMPLANT

## 2020-08-06 NOTE — Anesthesia Postprocedure Evaluation (Signed)
Anesthesia Post Note  Patient: Yolanda Peters  Procedure(s) Performed: IRRIGATION AND DEBRIDEMENT OF LEG, wound vac placement (Right)     Patient location during evaluation: PACU Anesthesia Type: General Level of consciousness: awake and alert Pain management: pain level controlled Vital Signs Assessment: post-procedure vital signs reviewed and stable Respiratory status: spontaneous breathing, nonlabored ventilation, respiratory function stable and patient connected to nasal cannula oxygen Cardiovascular status: blood pressure returned to baseline and stable Postop Assessment: no apparent nausea or vomiting Anesthetic complications: no   No notable events documented.  Last Vitals:  Vitals:   08/06/20 1840 08/06/20 1855  BP: (!) 145/83 (!) 153/81  Pulse: 79 82  Resp: 13 13  Temp:    SpO2: 96% 96%    Last Pain:  Vitals:   08/06/20 1825  TempSrc:   PainSc: [place]

## 2020-08-06 NOTE — Op Note (Addendum)
08/06/2020  6:36 PM  PATIENT:  Yolanda Peters    PRE-OPERATIVE DIAGNOSIS:  RIGHT LEG WOUND  POST-OPERATIVE DIAGNOSIS:  Same  PROCEDURE:  IRRIGATION AND DEBRIDEMENT OF LEG, wound vac placement  SURGEON:  [name], MD  PHYSICIAN ASSISTANT:None ANESTHESIA:   General  PREOPERATIVE INDICATIONS:  Yolanda Peters is a  80 y.o. female with a diagnosis of RIGHT LEG WOUND who failed conservative measures and elected for surgical management.    The risks benefits and alternatives were discussed with the patient preoperatively including but not limited to the risks of infection, bleeding, nerve injury, cardiopulmonary complications, the need for revision surgery, among others, and the patient was willing to proceed.  OPERATIVE IMPLANTS: Wound VAC sponges x2 cleanse choice  '@ENCIMAGES'$ @  OPERATIVE FINDINGS: Wound bed had healthy bleeding granulation tissue necrotic tissue was excised and sent for cultures.  OPERATIVE PROCEDURE: Patient was brought the operating room and underwent a general anesthetic.  After adequate levels anesthesia were obtained patient's right lower extremity was prepped using DuraPrep draped into a sterile field a timeout was called.  A 21 blade knife was used to excise skin soft tissue and fascia down to healthy viable tissue.  After debridement the wound was 15 x 7 cm.  This was irrigated with saline electrocardio was used hemostasis.  The wound edges were prepped with Cavilon and the reticulated cleanse choice sponge was applied and this was covered with a solid sponge.  This was outlined with derma tack covered with Covan this had a good suction fit installation therapy was set for 12 cc of irrigation.  Minimal bleeding patient was extubated taken the PACU in stable condition  Debridement type: Excisional Debridement  Side: right  Body Location: leg   Tools used for debridement: scalpel  Pre-debridement Wound size (cm):   Length: 7        Width: 7     Depth: 1    Post-debridement Wound size (cm):   Length: 15        Width: 7     Depth: 1   Debridement depth beyond dead/damaged tissue down to healthy viable tissue: yes  Tissue layer involved: skin, subcutaneous tissue, muscle / fascia  Nature of tissue removed: Slough, Necrotic, Devitalized Tissue, Non-viable tissue, and Purulence  Irrigation volume: 1 liter     Irrigation fluid type: Normal Saline     DISCHARGE PLANNING:  Antibiotic duration: Continue IV antibiotics adjust according to culture sensitivities  Weightbearing: Physical therapy weightbearing as tolerated  Pain medication: Opioid pathway  Dressing care/ Wound VAC: Continue installation wound VAC anticipate return to the operating room on Tuesday or Wednesday.  Ambulatory devices: Walker  Discharge to: Anticipate discharge to home in about a week  Follow-up: In the office 1 week post operative.

## 2020-08-06 NOTE — Consult Note (Signed)
ORTHOPAEDIC CONSULTATION  REQUESTING PHYSICIAN: Charlynne Cousins, MD  Chief Complaint: Necrotic ulcer right leg.  HPI: Yolanda Peters is a 80 y.o. female who presents with blunt trauma to to the right leg causing a large soft tissue defect and hematoma with skin soft tissue and muscle damage.  Patient has a history of venous insufficiency as well as arthritis.  She is status post a right total knee arthroplasty 2 years ago.  Status post a left total knee arthroplasty 1 year ago.  Past Medical History:  Diagnosis Date   Allergy    Arthritis    Breast cancer ([place]) 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, skin cnacer   Cancer ([place])    right breast    Hyperlipidemia    no meds taken   Hypertension    Personal history of radiation therapy 2011   rt breast   Past Surgical History:  Procedure Laterality Date   BREAST DUCTAL SYSTEM EXCISION     right breast   BREAST LUMPECTOMY     CATARACT EXTRACTION     bilateral   CESAREAN SECTION     COLONOSCOPY     ELBOW SURGERY     left   JOINT REPLACEMENT     KNEE ARTHROPLASTY Left    March 2021   MYOMECTOMY     REPLACEMENT TOTAL KNEE Left 03/2019   TOTAL KNEE ARTHROPLASTY Right 06/26/2018   Procedure: TOTAL KNEE ARTHROPLASTY;  Surgeon: Paralee Cancel, MD;  Location: [facility];  Service: Orthopedics;  Laterality: Right;  70 mins   Social History   Socioeconomic History   Marital status: Married    Spouse name: Not on file   Number of children: Not on file   Years of education: Not on file   Highest education level: Not on file  Occupational History   Not on file  Tobacco Use   Smoking status: Never   Smokeless tobacco: Never  Vaping Use   Vaping Use: Never used  Substance and Sexual Activity   Alcohol use: Yes    Comment: occ   Drug use: No   Sexual activity: Yes  Other Topics Concern   Not on file  Social History Narrative   Not on file   Social Determinants of Health   Financial Resource Strain: Not on file  Food Insecurity: Not  on file  Transportation Needs: Not on file  Physical Activity: Not on file  Stress: Not on file  Social Connections: Not on file   Family History  Problem Relation Age of Onset   Cancer Mother        bladder   Cancer Father        leukemia   Cancer Maternal Aunt        breast   Colon cancer Paternal Aunt        pt is unsure of this   Colon cancer Cousin        pt is unsure of this   Esophageal cancer Neg Hx    Rectal cancer Neg Hx    Stomach cancer Neg Hx    - negative except otherwise stated in the family history section Allergies  Allergen Reactions   Penicillins Nausea Only and Swelling    Fever Did it involve swelling of the face/tongue/throat, SOB, or low BP? Unknown Did it involve sudden or severe rash/hives, skin peeling, or any reaction on the inside of your mouth or nose? No Did you need to seek medical attention at a hospital  or doctor's office? Yes When did it last happen?      60+ years ago If all above answers are "NO", may proceed with cephalosporin use.    Tetracyclines & Related Nausea Only   Prior to Admission medications   Medication Sig Start Date End Date Taking? Authorizing Provider  APPLE CIDER VINEGAR PO Take 625 mg by mouth daily.   Yes [provider]  Ascorbic Acid (VITAMIN C) 1000 MG tablet Take 1,000 mg by mouth daily.   Yes [provider]  beta carotene 10000 UNIT capsule Take 25,000 Units by mouth daily.   Yes [provider]  CALCIUM & MAGNESIUM CARBONATES PO Take 2 tablets by mouth daily.   Yes [provider]  Cholecalciferol (VITAMIN D-3 PO) Take 2,000 Units by mouth daily.   Yes [provider]  Ginger, Zingiber officinalis, (GINGER ROOT PO) Take 200 mg by mouth 3 (three) times a week.   Yes [provider]  loratadine (CLARITIN) 10 MG tablet Take 10 mg by mouth daily.   Yes [provider]  Multiple Vitamins-Minerals (ZINC PO) Take 50 mg by mouth daily.   Yes [provider]  neomycin-bacitracin-polymyxin (NEOSPORIN) ointment Apply 1 application topically as needed for wound care.   Yes [provider]  Probiotic Product (PROBIOTIC DAILY PO) Take 1 capsule by mouth daily.    Yes [provider]  trimethoprim (TRIMPEX) 100 MG tablet Take 100 mg by mouth daily. 06/01/20  Yes [provider]  verapamil (VERELAN PM) 360 MG 24 hr capsule Take 360 mg by mouth daily.  12/27/13  Yes [provider]  vitamin E 1000 UNIT capsule Take 1,000 Units by mouth daily.   Yes [provider]   DG Tibia/Fibula Right  Result Date: 08/04/2020 CLINICAL DATA:  Initial evaluation for recent fall with recent laceration to right leg, now with infection. EXAM: RIGHT TIBIA AND FIBULA - 2 VIEW COMPARISON:  None. FINDINGS: No acute fracture or dislocation. 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 No discrete osseous lesions. Osteopenia noted. Focal soft tissue swelling noted at the lateral aspect of the right lower leg, nonspecific, but could reflect acute soft tissue injury and/or infection. No radiopaque foreign body. No dissecting soft tissue emphysema. No other visible soft tissue abnormality. Few scattered vascular calcifications noted. IMPRESSION: 1. Focal soft tissue swelling at the lateral aspect of the right lower leg, nonspecific, but could reflect focal soft tissue injury and/or infection. No radiopaque foreign body or dissecting soft tissue emphysema. 2. No acute osseous abnormality. 3. 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 Electronically Signed   By: Jeannine Boga M.D.   On: 08/04/2020 23:26   DG Chest Port 1 View  Result Date: 08/04/2020 CLINICAL DATA:  Initial evaluation for questionable sepsis. EXAM: PORTABLE CHEST 1 VIEW COMPARISON:  Prior radiograph from 08/13/2009. FINDINGS: Mild cardiomegaly. Mediastinal silhouette within normal limits. Aortic atherosclerosis.  Lungs normally inflated. Mild diffuse interstitial congestion without frank alveolar edema. No pleural effusion. Hazy density at the peripheral left lung base favored to reflect atelectasis and/or congestion. Possible infiltrate difficult to exclude, and could be considered in the correct clinical setting. No other focal airspace disease. No pneumothorax. No acute osseous finding. Osteopenia. Degenerative changes noted about the visualized shoulders. IMPRESSION: 1. Hazy density at the peripheral left lung base, favored to reflect atelectasis and/or congestion. Possible infiltrate difficult to exclude, and could be considered in the correct clinical setting. 2. Cardiomegaly with underlying mild  diffuse pulmonary interstitial congestion. 3.  Aortic Atherosclerosis (ICD10-I70.0). Electronically Signed   By: Jeannine Boga M.D.   On: 08/04/2020 23:28   - pertinent xrays, CT, MRI studies were reviewed and independently interpreted  Positive ROS: All other systems have been reviewed and were otherwise negative with the exception of those mentioned in the HPI and as above.  Physical Exam: General: Alert, no acute distress Psychiatric: Patient is competent for consent with normal mood and affect Lymphatic: No axillary or cervical lymphadenopathy Cardiovascular: No pedal edema Respiratory: No cyanosis, no use of accessory musculature GI: No organomegaly, abdomen is soft and non-tender    Images:  '@ENCIMAGES'$ @  Labs:  Lab Results  Component Value Date   ESRSEDRATE 2 01/22/2018   ESRSEDRATE 2 03/09/2017   REPTSTATUS PENDING 08/05/2020   REPTSTATUS PENDING 08/05/2020   CULT  08/05/2020    NO GROWTH < 12 HOURS Performed at [facility] [address] [place], [place] [address]    CULT  08/05/2020    NO GROWTH < 12 HOURS Performed at [facility] [address] [place], [place] [address]     Lab Results  Component Value Date   ALBUMIN 4.0 08/05/2020   ALBUMIN 4.2  08/17/2016   ALBUMIN 4.2 01/13/2016     CBC EXTENDED Latest Ref Rng & Units 08/05/2020 08/05/2020 06/27/2018  WBC 4.0 - 10.5 K/uL 7.0 8.7 11.4(H)  RBC 3.87 - 5.11 MIL/uL 4.24 4.38 3.55(L)  HGB 12.0 - 15.0 g/dL 12.2 12.5 9.9(L)  HCT 36.0 - 46.0 % 38.0 39.7 31.8(L)  PLT 150 - 400 K/uL 298 352 233  NEUTROABS 1.7 - 7.7 K/uL - 5.6 -  LYMPHSABS 0.7 - 4.0 K/uL - 1.8 -    Neurologic: Patient does not have protective sensation bilateral lower extremities.   MUSCULOSKELETAL:   Skin: Examination patient has a large necrotic ulcer over the anterior lateral mid aspect of the right leg.  There is extensive brawny skin color changes with varicose veins and venous insufficiency.  Recent Dopplers were negative for DVT.  Patient is not on any disease modifying drugs for her arthritis she is not on prednisone.  Hemoglobin 12.2 white blood cell count 7.0.  Albumin 4.0.  There is no cellulitis in the right leg no purulent drainage.  Assessment: Venous stasis insufficiency right lower extremity with traumatic wound with secondary hematoma causing skin soft tissue and muscle necrosis of the anterior compartment right leg.  Plan: Plan: We will plan for surgical debridement today placement of an installation wound VAC.  Anticipate in approximately 5 days to return to the operating room for further debridement once there is a healthy granulating wound bed we can proceed with xenograft or allograft skin graft.  Thank you for the consult and the opportunity to see Ms. Folse  Meridee Score, MD [place] [phone] 8:06 AM

## 2020-08-06 NOTE — Progress Notes (Signed)
[facility] PROGRESS NOTE    Progress Note  JOSILYN MILANOWSKI  [id number] DOB: 1940-08-25 DOA: 08/04/2020 PCP: Shon Baton, MD     Brief Narrative:   Yolanda Peters is an 80 y.o. female past medical history significant for essential hypertension rheumatoid arthritis breast cancer in remission sent for laceration after a fall on 07/26/2020 since then has been coming crease erythematous and with purulent drainage   Assessment/Plan:   Cellulitis of right leg/ Ulcer of right leg, with necrosis of muscle: Started on Rocephin and IV vancomycin. Has remained afebrile with no leukocytosis. Due to the severity of the wound, orthopedic surgery was consulted who is planning for surgical debridement and wound VAC placement on 08/06/2020  RA (rheumatoid arthritis) ([place]) Noted, on no meds.  Essential HTN (hypertension) Continue verapamil.   DVT prophylaxis: lovenox Family Communication:none Status is: Inpatient  Remains inpatient appropriate because:Hemodynamically unstable  Dispo: The patient is from: Home              Anticipated d/c is to: Home              Patient currently is not medically stable to d/c.   Difficult to place patient No        Code Status:     Code Status Orders  (From admission, onward)           Start     Ordered   08/05/20 0359  Full code  Continuous        08/05/20 0401           Code Status History     Date Active Date Inactive Code Status Order ID Comments User Context   06/26/2018 1225 06/27/2018 1416 Full Code [id number]  Danae Orleans, PA-C Inpatient         IV Access:   Peripheral IV   Procedures and diagnostic studies:   DG Tibia/Fibula Right  Result Date: 08/04/2020 CLINICAL DATA:  Initial evaluation for recent fall with recent laceration to right leg, now with infection. EXAM: RIGHT TIBIA AND FIBULA - 2 VIEW COMPARISON:  None. FINDINGS: No acute fracture or dislocation. 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 No discrete osseous lesions. Osteopenia noted. Focal soft tissue swelling noted at the lateral aspect of the right lower leg, nonspecific, but could reflect acute soft tissue injury and/or infection. No radiopaque foreign body. No dissecting soft tissue emphysema. No other visible soft tissue abnormality. Few scattered vascular calcifications noted. IMPRESSION: 1. Focal soft tissue swelling at the lateral aspect of the right lower leg, nonspecific, but could reflect focal soft tissue injury and/or infection. No radiopaque foreign body or dissecting soft tissue emphysema. 2. No acute osseous abnormality. 3. 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 Electronically Signed   By: Jeannine Boga M.D.   On: 08/04/2020 23:26   DG Chest Port 1 View  Result Date: 08/04/2020 CLINICAL DATA:  Initial evaluation for questionable sepsis. EXAM: PORTABLE CHEST 1 VIEW COMPARISON:  Prior radiograph from 08/13/2009. FINDINGS: Mild cardiomegaly. Mediastinal silhouette within normal limits. Aortic atherosclerosis. Lungs normally inflated. Mild diffuse interstitial congestion without frank alveolar edema. No pleural effusion. Hazy density at the peripheral left lung base favored to reflect atelectasis and/or congestion. Possible infiltrate difficult to exclude, and could be considered in the correct clinical setting. No other focal airspace disease. No pneumothorax. No acute osseous finding. Osteopenia. Degenerative changes noted about the visualized shoulders. IMPRESSION: 1. Hazy density at the peripheral left lung base,  favored to reflect atelectasis and/or congestion. Possible infiltrate difficult to exclude, and could be considered in the correct clinical setting. 2. Cardiomegaly with underlying mild diffuse pulmonary interstitial congestion. 3.  Aortic Atherosclerosis (ICD10-I70.0). Electronically Signed   By: Jeannine Boga M.D.   On: 08/04/2020 23:28      Medical Consultants:   None.   Subjective:    Roderica Mahin Lubas no pain stable  Objective:    Vitals:   08/05/20 2041 08/05/20 2148 08/05/20 2232 08/06/20 0353  BP: 137/83 (!) 127/91 (!) 141/87 136/79  Pulse: 94 88 91 86  Resp: (!) '21 15 17 18  '$ Temp: 98.3 F (36.8 C) 98.7 F (37.1 C) 98.4 F (36.9 C) 98.2 F (36.8 C)  TempSrc: Oral Oral Oral Oral  SpO2: 96% 95% 97% 96%  Weight:      Height:       SpO2: 96 %   Intake/Output Summary (Last 24 hours) at 08/06/2020 0901 Last data filed at 08/06/2020 0646 Gross per 24 hour  Intake 507.11 ml  Output --  Net 507.11 ml   Filed Weights   08/05/20 0904  Weight: 70.4 kg    Exam: General exam: In no acute distress. Respiratory system: Good air movement and clear to auscultation. Cardiovascular system: S1 & S2 heard, RRR. No JVD. Gastrointestinal system: Abdomen is nondistended, soft and nontender.  Extremities: No pedal edema. Skin: Erythema warm to touch no fluctuation with necrotic tissue Psychiatry: Judgement and insight appear normal. Mood & affect appropriate.    Data Reviewed:    Labs: Basic Metabolic Panel: Recent Labs  Lab 08/05/20 0004 08/05/20 0703  NA 142 141  K 4.1 3.6  CL 105 102  CO2 26 26  GLUCOSE 108* 94  BUN 33* 22  CREATININE 1.00 0.60  CALCIUM 9.3 8.9   GFR Estimated Creatinine Clearance: 54.9 mL/min (by C-G formula based on SCr of 0.6 mg/dL). Liver Function Tests: Recent Labs  Lab 08/05/20 0004  AST 17  ALT 16  ALKPHOS 87  BILITOT 1.0  PROT 7.6  ALBUMIN 4.0   No results for input(s): LIPASE, AMYLASE in the last 168 hours. No results for input(s): AMMONIA in the last 168 hours. Coagulation profile Recent Labs  Lab 08/05/20 0004  INR 1.0   COVID-19 Labs  No results for input(s): DDIMER, FERRITIN, LDH, CRP in the last 72 hours.  Lab Results  Component Value Date   [place] NEGATIVE 08/05/2020   SARSCOV2NAA NOT DETECTED 06/22/2018    CBC: Recent Labs  Lab  08/05/20 0004 08/05/20 0703  WBC 8.7 7.0  NEUTROABS 5.6  --   HGB 12.5 12.2  HCT 39.7 38.0  MCV 90.6 89.6  PLT 352 298   Cardiac Enzymes: No results for input(s): CKTOTAL, CKMB, CKMBINDEX, TROPONINI in the last 168 hours. BNP (last 3 results) No results for input(s): PROBNP in the last 8760 hours. CBG: No results for input(s): GLUCAP in the last 168 hours. D-Dimer: No results for input(s): DDIMER in the last 72 hours. Hgb A1c: No results for input(s): HGBA1C in the last 72 hours. Lipid Profile: No results for input(s): CHOL, HDL, LDLCALC, TRIG, CHOLHDL, LDLDIRECT in the last 72 hours. Thyroid function studies: No results for input(s): TSH, T4TOTAL, T3FREE, THYROIDAB in the last 72 hours.  Invalid input(s): FREET3 Anemia work up: No results for input(s): VITAMINB12, FOLATE, FERRITIN, TIBC, IRON, RETICCTPCT in the last 72 hours. Sepsis Labs: Recent Labs  Lab 08/05/20 0004 08/05/20 0703  WBC 8.7 7.0  LATICACIDVEN 1.1  --  Microbiology Recent Results (from the past 240 hour(s))  Blood Culture (routine x 2)     Status: None (Preliminary result)   Collection Time: 08/05/20 12:06 AM   Specimen: BLOOD  Result Value Ref Range Status   Specimen Description   Final    BLOOD LEFT ANTECUBITAL Performed at [facility] [address], [place], [place] [address]    Special Requests   Final    BOTTLES DRAWN AEROBIC AND ANAEROBIC Blood Culture results may not be optimal due to an inadequate volume of blood received in culture bottles Performed at [facility] [address] [place], [place] [address]    Culture   Final    NO GROWTH < 12 HOURS Performed at [facility] [address], [place], [place] [address]    Report Status PENDING  Incomplete  Blood Culture (routine x 2)     Status: None (Preliminary result)   Collection Time: 08/05/20 12:06 AM   Specimen: BLOOD  Result Value Ref Range Status   Specimen Description    Final    BLOOD RIGHT ANTECUBITAL Performed at [facility] [address], [place], [place] [address]    Special Requests   Final    BOTTLES DRAWN AEROBIC AND ANAEROBIC Blood Culture results may not be optimal due to an excessive volume of blood received in culture bottles Performed at [facility] [address], [place], [place] [address]    Culture   Final    NO GROWTH < 12 HOURS Performed at [facility] [address] [place], [place] [address]    Report Status PENDING  Incomplete  SARS CORONAVIRUS 2 (TAT 6-24 HRS) Nasopharyngeal Nasopharyngeal Swab     Status: None   Collection Time: 08/05/20  1:31 AM   Specimen: Nasopharyngeal Swab  Result Value Ref Range Status   SARS Coronavirus 2 NEGATIVE NEGATIVE Final    Comment: (NOTE) SARS-CoV-2 target nucleic acids are NOT DETECTED.  The SARS-CoV-2 RNA is generally detectable in upper and lower respiratory specimens during the acute phase of infection. Negative results do not preclude SARS-CoV-2 infection, do not rule out co-infections with other pathogens, and should not be used as the sole basis for treatment or other patient management decisions. Negative results must be combined with clinical observations, patient history, and epidemiological information. The expected result is Negative.  Fact Sheet for Patients: SugarRoll.be  Fact Sheet for Healthcare Providers: https://www.woods-mathews.com/  This test is not yet approved or cleared by the [place] FDA and  has been authorized for detection and/or diagnosis of SARS-CoV-2 by FDA under an Emergency Use Authorization (EUA). This EUA will remain  in effect (meaning this test can be used) for the duration of the COVID-19 declaration under Se ction 564(b)(1) of the Act, 21 U.S.C. section 360bbb-3(b)(1), unless the authorization is terminated or revoked sooner.  Performed at [facility], [place] [address], [place], [place] [address]   MRSA Next Gen by PCR, Nasal     Status: None   Collection Time: 08/05/20  9:07 AM   Specimen: Nasal Mucosa; Nasal Swab  Result Value Ref Range Status   MRSA by PCR Next Gen NOT DETECTED NOT DETECTED Final    Comment: (NOTE) The GeneXpert MRSA Assay (FDA approved for NASAL specimens only), is one component of a comprehensive MRSA colonization surveillance program. It is not intended to diagnose MRSA infection nor to guide or monitor treatment for MRSA  infections. Test performance is not FDA approved in patients less than 86 years old. Performed at [facility], [place] [address], [place], [place] [address]   Surgical pcr screen     Status: None   Collection Time: 08/05/20  9:07 AM   Specimen: Nasal Mucosa; Nasal Swab  Result Value Ref Range Status   MRSA, PCR NEGATIVE NEGATIVE Final   Staphylococcus aureus NEGATIVE NEGATIVE Final    Comment: (NOTE) The Xpert SA Assay (FDA approved for NASAL specimens in patients 107 years of age and older), is one component of a comprehensive surveillance program. It is not intended to diagnose infection nor to guide or monitor treatment. Performed at [facility], [place] [address] [place], [place] [address]      Medications:    enoxaparin (LOVENOX) injection  40 mg Subcutaneous Q24H   verapamil  360 mg Oral Daily   Continuous Infusions:  cefTRIAXone (ROCEPHIN)  IV 2 g (08/06/20 0534)   vancomycin 1,000 mg (08/05/20 2135)      LOS: 1 day   Charlynne Cousins  [facility]  08/06/2020, 9:01 AM

## 2020-08-06 NOTE — Anesthesia Procedure Notes (Signed)
Procedure Name: LMA Insertion Date/Time: 08/06/2020 5:41 PM Performed by: Dorthea Cove, CRNA Pre-anesthesia Checklist: Patient identified, Emergency Drugs available, Suction available and Patient being monitored Patient Re-evaluated:Patient Re-evaluated prior to induction Oxygen Delivery Method: Circle System Utilized Preoxygenation: Pre-oxygenation with 100% oxygen Induction Type: IV induction Ventilation: Mask ventilation without difficulty LMA: LMA inserted LMA Size: 4.0 Number of attempts: 1 Airway Equipment and Method: Bite block Placement Confirmation: positive ETCO2 Tube secured with: Tape Dental Injury: Teeth and Oropharynx as per pre-operative assessment

## 2020-08-06 NOTE — Anesthesia Preprocedure Evaluation (Addendum)
Anesthesia Evaluation  Patient identified by MRN, date of birth, ID band Patient awake    Reviewed: Allergy & Precautions, NPO status , Patient's Chart, lab work & pertinent test results  History of Anesthesia Complications Negative for: history of anesthetic complications  Airway Mallampati: II  TM Distance: >3 FB Neck ROM: Full    Dental  (+) Dental Advisory Given   Pulmonary neg pulmonary ROS,  08/05/2020 SARS coronavirus NEG   breath sounds clear to auscultation       Cardiovascular hypertension, Pt. on medications (-) angina+ Peripheral Vascular Disease   Rhythm:Regular Rate:Normal     Neuro/Psych negative neurological ROS     GI/Hepatic negative GI ROS, Neg liver ROS,   Endo/Other    Renal/GU negative Renal ROS     Musculoskeletal   Abdominal   Peds  Hematology negative hematology ROS (+)   Anesthesia Other Findings H/o breast cancer  Reproductive/Obstetrics                            Anesthesia Physical Anesthesia Plan  ASA: 2  Anesthesia Plan: General   Post-op Pain Management:    Induction: Intravenous  PONV Risk Score and Plan: 3 and Ondansetron, Dexamethasone and Treatment may vary due to age or medical condition  Airway Management Planned: LMA  Additional Equipment: None  Intra-op Plan:   Post-operative Plan:   Informed Consent: I have reviewed the patients History and Physical, chart, labs and discussed the procedure including the risks, benefits and alternatives for the proposed anesthesia with the patient or authorized representative who has indicated his/her understanding and acceptance.     Dental advisory given  Plan Discussed with: CRNA and Surgeon  Anesthesia Plan Comments:        Anesthesia Quick Evaluation

## 2020-08-06 NOTE — Transfer of Care (Signed)
Immediate Anesthesia Transfer of Care Note  Patient: Floe Pruette Roessner  Procedure(s) Performed: IRRIGATION AND DEBRIDEMENT OF LEG, wound vac placement (Right)  Patient Location: PACU  Anesthesia Type:General  Level of Consciousness: awake, alert  and oriented  Airway & Oxygen Therapy: Patient Spontanous Breathing  Post-op Assessment: Report given to RN and Post -op Vital signs reviewed and stable  Post vital signs: Reviewed and stable  Last Vitals:  Vitals Value Taken Time  BP 136/77 08/06/20 1825  Temp    Pulse 84 08/06/20 1827  Resp 15 08/06/20 1827  SpO2 89 % 08/06/20 1827  Vitals shown include unvalidated device data.  Last Pain:  Vitals:   08/06/20 1606  TempSrc:   PainSc: 0-No pain         Complications: No notable events documented.

## 2020-08-06 NOTE — Progress Notes (Signed)
Daughter, Sharyn Lull, would like to be contacted first for any health care communication.

## 2020-08-06 NOTE — Progress Notes (Signed)
Patient returned from PACU. Alert and oriented x 4. C/o of pain 4/10. Wound vac connected to RLE.

## 2020-08-06 NOTE — Progress Notes (Signed)
Pharmacy Antibiotic Note  Yolanda Peters is a 80 y.o. female admitted on 08/04/2020 with  wound infection .  Pharmacy has been consulted for Vanco dosing.  ID: Cellulitis & wound of R leg (from fall). Afebrile. WBC WNL.  Antimicrobials this admission:  7/27 Vanc >> 7/27 CTX>> Trimethoprim  Microbiology results:  7/27 BCx2: 7/27 MRSA PCR: neg  Dosage changes 7/27: Vanc 1g IV Q24h (SCr rounded to 1, est AUC 531) 7/28: Incr Vanco '1250mg'$  IV q24 (Scr 0.8, AUC 508)  Plan: - 7/28: surgical debridement today placement of an installation wound VAC. - Increase Vanco to '1250mg'$  IV q24h (Scr 0.8, AUC 508)     Height: '5\' 4"'$  (162.6 cm) Weight: 70.4 kg (155 lb 3.3 oz) IBW/kg (Calculated) : 54.7  Temp (24hrs), Avg:98.2 F (36.8 C), Min:97.8 F (36.6 C), Max:98.7 F (37.1 C)  Recent Labs  Lab 08/05/20 0004 08/05/20 0703  WBC 8.7 7.0  CREATININE 1.00 0.60  LATICACIDVEN 1.1  --     Estimated Creatinine Clearance: 54.9 mL/min (by C-G formula based on SCr of 0.6 mg/dL).    Allergies  Allergen Reactions   Penicillins Nausea Only and Swelling    Fever Did it involve swelling of the face/tongue/throat, SOB, or low BP? Unknown Did it involve sudden or severe rash/hives, skin peeling, or any reaction on the inside of your mouth or nose? No Did you need to seek medical attention at a hospital or doctor's office? Yes When did it last happen?      60+ years ago If all above answers are "NO", may proceed with cephalosporin use.    Tetracyclines & Related Nausea Only    Aspen Lawrance S. Alford Highland, PharmD, BCPS Clinical Staff Pharmacist [email or web address]  Wayland Salinas 08/06/2020 10:34 AM

## 2020-08-07 ENCOUNTER — Encounter (HOSPITAL_COMMUNITY): Payer: Self-pay | Admitting: Orthopedic Surgery

## 2020-08-07 DIAGNOSIS — L03115 Cellulitis of right lower limb: Secondary | ICD-10-CM | POA: Diagnosis not present

## 2020-08-07 DIAGNOSIS — I10 Essential (primary) hypertension: Secondary | ICD-10-CM | POA: Diagnosis not present

## 2020-08-07 MED ORDER — POLYETHYLENE GLYCOL 3350 17 G PO PACK
17.0000 g | PACK | Freq: Two times a day (BID) | ORAL | Status: AC
  Administered 2020-08-07: 17 g via ORAL
  Filled 2020-08-07 (×2): qty 1

## 2020-08-07 NOTE — Progress Notes (Signed)
Patient ID: Yolanda Peters, female   DOB: [birth year], 80 y.o.   MRN: [id number] Postoperative day 1 debridement of right calf wound with placement of installation wound VAC.  The wound VAC is functioning well.  Cultures are pending.  Anticipate return to the operating room on Tuesday or Wednesday for skin graft.

## 2020-08-07 NOTE — Progress Notes (Signed)
[facility] PROGRESS NOTE    Progress Note  Yolanda Peters  [id number] DOB: 1940/03/31 DOA: 08/04/2020 PCP: Shon Baton, MD     Brief Narrative:   Yolanda Peters is an 80 y.o. female past medical history significant for essential hypertension rheumatoid arthritis breast cancer in remission sent for laceration after a fall on 07/26/2020 since then has been coming crease erythematous and with purulent drainage   Assessment/Plan:   Cellulitis of right leg/ Ulcer of right leg, with necrosis of muscle: Started on Rocephin and IV vancomycin. Orthopedic surgery was consulted I&D was performed on 08/07/2018 wound VAC in place. Orthopedic surgery recommended return to the OR early next week. Consult physical therapy.  Pain is controlled.  RA (rheumatoid arthritis) (HCC) Noted, on no meds.  Essential HTN (hypertension) Continue verapamil.   DVT prophylaxis: lovenox Family Communication:none Status is: Inpatient  Remains inpatient appropriate because:Hemodynamically unstable  Dispo: The patient is from: Home              Anticipated d/c is to: Home              Patient currently is not medically stable to d/c.   Difficult to place patient No        Code Status:     Code Status Orders  (From admission, onward)           Start     Ordered   08/05/20 0359  Full code  Continuous        08/05/20 0401           Code Status History     Date Active Date Inactive Code Status Order ID Comments User Context   06/26/2018 1225 06/27/2018 1416 Full Code [id number]  Danae Orleans, PA-C Inpatient         IV Access:   Peripheral IV   Procedures and diagnostic studies:   No results found.   Medical Consultants:   None.   Subjective:    Yolanda Peters no pain stable ready to start working with physical therapy  Objective:    Vitals:   08/06/20 2300 08/07/20 0007 08/07/20 0053 08/07/20 0513  BP: 135/81 (!) 106/59 (!) 150/88 (!) 141/95   Pulse: 76 83 87 82  Resp: '16 20 18 16  '$ Temp: 97.6 F (36.4 C) 98.1 F (36.7 C) 97.9 F (36.6 C)   TempSrc: Oral Oral Oral   SpO2: 93% 91% 94%   Weight:      Height:       SpO2: 94 % O2 Flow Rate (L/min): 2 L/min   Intake/Output Summary (Last 24 hours) at 08/07/2020 0837 Last data filed at 08/07/2020 0720 Gross per 24 hour  Intake 1133.85 ml  Output 700 ml  Net 433.85 ml    Filed Weights   08/05/20 0904  Weight: 70.4 kg    Exam: General exam: In no acute distress. Respiratory system: Good air movement and clear to auscultation. Cardiovascular system: S1 & S2 heard, RRR. No JVD. Gastrointestinal system: Abdomen is nondistended, soft and nontender.  Extremities: No pedal edema. Skin: No rashes, lesions or ulcers  Data Reviewed:    Labs: Basic Metabolic Panel: Recent Labs  Lab 08/05/20 0004 08/05/20 0703  NA 142 141  K 4.1 3.6  CL 105 102  CO2 26 26  GLUCOSE 108* 94  BUN 33* 22  CREATININE 1.00 0.60  CALCIUM 9.3 8.9    GFR Estimated Creatinine Clearance: 54.9 mL/min (by C-G formula based on SCr  of 0.6 mg/dL). Liver Function Tests: Recent Labs  Lab 08/05/20 0004  AST 17  ALT 16  ALKPHOS 87  BILITOT 1.0  PROT 7.6  ALBUMIN 4.0    No results for input(s): LIPASE, AMYLASE in the last 168 hours. No results for input(s): AMMONIA in the last 168 hours. Coagulation profile Recent Labs  Lab 08/05/20 0004  INR 1.0    COVID-19 Labs  No results for input(s): DDIMER, FERRITIN, LDH, CRP in the last 72 hours.  Lab Results  Component Value Date   [place] NEGATIVE 08/05/2020   SARSCOV2NAA NOT DETECTED 06/22/2018    CBC: Recent Labs  Lab 08/05/20 0004 08/05/20 0703  WBC 8.7 7.0  NEUTROABS 5.6  --   HGB 12.5 12.2  HCT 39.7 38.0  MCV 90.6 89.6  PLT 352 298    Cardiac Enzymes: No results for input(s): CKTOTAL, CKMB, CKMBINDEX, TROPONINI in the last 168 hours. BNP (last 3 results) No results for input(s): PROBNP in the last 8760  hours. CBG: No results for input(s): GLUCAP in the last 168 hours. D-Dimer: No results for input(s): DDIMER in the last 72 hours. Hgb A1c: No results for input(s): HGBA1C in the last 72 hours. Lipid Profile: No results for input(s): CHOL, HDL, LDLCALC, TRIG, CHOLHDL, LDLDIRECT in the last 72 hours. Thyroid function studies: No results for input(s): TSH, T4TOTAL, T3FREE, THYROIDAB in the last 72 hours.  Invalid input(s): FREET3 Anemia work up: No results for input(s): VITAMINB12, FOLATE, FERRITIN, TIBC, IRON, RETICCTPCT in the last 72 hours. Sepsis Labs: Recent Labs  Lab 08/05/20 0004 08/05/20 0703  WBC 8.7 7.0  LATICACIDVEN 1.1  --     Microbiology Recent Results (from the past 240 hour(s))  Blood Culture (routine x 2)     Status: None (Preliminary result)   Collection Time: 08/05/20 12:06 AM   Specimen: BLOOD  Result Value Ref Range Status   Specimen Description   Final    BLOOD LEFT ANTECUBITAL Performed at [facility], [place] [address] [place], [place] [address]    Special Requests   Final    BOTTLES DRAWN AEROBIC AND ANAEROBIC Blood Culture results may not be optimal due to an inadequate volume of blood received in culture bottles Performed at [facility] [address] [place], [place] [address]    Culture   Final    NO GROWTH 1 DAY Performed at [facility], [place] [address] [place], [place] [address]    Report Status PENDING  Incomplete  Blood Culture (routine x 2)     Status: None (Preliminary result)   Collection Time: 08/05/20 12:06 AM   Specimen: BLOOD  Result Value Ref Range Status   Specimen Description   Final    BLOOD RIGHT ANTECUBITAL Performed at [facility] [address], [place], [place] [address]    Special Requests   Final    BOTTLES DRAWN AEROBIC AND ANAEROBIC Blood Culture results may not be optimal due to an excessive volume of blood received in culture bottles Performed at  [facility] [address] [place], [place] [address]    Culture   Final    NO GROWTH 1 DAY Performed at [facility], [place] [address], [place], [place] [address]    Report Status PENDING  Incomplete  SARS CORONAVIRUS 2 (TAT 6-24 HRS) Nasopharyngeal Nasopharyngeal Swab     Status: None   Collection Time: 08/05/20  1:31 AM   Specimen: Nasopharyngeal  Swab  Result Value Ref Range Status   SARS Coronavirus 2 NEGATIVE NEGATIVE Final    Comment: (NOTE) SARS-CoV-2 target nucleic acids are NOT DETECTED.  The SARS-CoV-2 RNA is generally detectable in upper and lower respiratory specimens during the acute phase of infection. Negative results do not preclude SARS-CoV-2 infection, do not rule out co-infections with other pathogens, and should not be used as the sole basis for treatment or other patient management decisions. Negative results must be combined with clinical observations, patient history, and epidemiological information. The expected result is Negative.  Fact Sheet for Patients: SugarRoll.be  Fact Sheet for Healthcare Providers: https://www.woods-mathews.com/  This test is not yet approved or cleared by the [place] FDA and  has been authorized for detection and/or diagnosis of SARS-CoV-2 by FDA under an Emergency Use Authorization (EUA). This EUA will remain  in effect (meaning this test can be used) for the duration of the COVID-19 declaration under Se ction 564(b)(1) of the Act, 21 U.S.C. section 360bbb-3(b)(1), unless the authorization is terminated or revoked sooner.  Performed at [facility], [place] [address], [place], [place] [address]   MRSA Next Gen by PCR, Nasal     Status: None   Collection Time: 08/05/20  9:07 AM   Specimen: Nasal Mucosa; Nasal Swab  Result Value Ref Range Status   MRSA by PCR Next Gen NOT DETECTED NOT DETECTED Final    Comment: (NOTE) The GeneXpert MRSA Assay (FDA  approved for NASAL specimens only), is one component of a comprehensive MRSA colonization surveillance program. It is not intended to diagnose MRSA infection nor to guide or monitor treatment for MRSA infections. Test performance is not FDA approved in patients less than 73 years old. Performed at [facility], [place] [address], [place], [place] [address]   Surgical pcr screen     Status: None   Collection Time: 08/05/20  9:07 AM   Specimen: Nasal Mucosa; Nasal Swab  Result Value Ref Range Status   MRSA, PCR NEGATIVE NEGATIVE Final   Staphylococcus aureus NEGATIVE NEGATIVE Final    Comment: (NOTE) The Xpert SA Assay (FDA approved for NASAL specimens in patients 56 years of age and older), is one component of a comprehensive surveillance program. It is not intended to diagnose infection nor to guide or monitor treatment. Performed at [facility], [place] [address], [place], [place] [address]   Aerobic/Anaerobic Culture w Gram Stain (surgical/deep wound)     Status: None (Preliminary result)   Collection Time: 08/06/20  5:53 PM   Specimen: Soft Tissue, Other  Result Value Ref Range Status   Specimen Description TISSUE RIGHT LEG  Final   Special Requests NONE  Final   Gram Stain   Final    MODERATE WBC PRESENT, PREDOMINANTLY PMN NO ORGANISMS SEEN Performed at [facility], [place] [address] [place], [place] [address]    Culture PENDING  Incomplete   Report Status PENDING  Incomplete     Medications:    vitamin C  1,000 mg Oral Daily   cholecalciferol  2,000 Units Oral Daily   docusate sodium  100 mg Oral BID   enoxaparin (LOVENOX) injection  40 mg Subcutaneous Q24H   trimethoprim  100 mg Oral Daily   verapamil  360 mg Oral Daily   vitamin A  20,000 Units Oral Daily   vitamin E  1,000 Units Oral Daily   Continuous Infusions:  sodium chloride 10 mL/hr at 08/06/20 1959  cefTRIAXone (ROCEPHIN)  IV 2 g (08/07/20 0517)   methocarbamol  (ROBAXIN) IV     vancomycin 1,250 mg (08/06/20 2102)      LOS: 2 days   Charlynne Cousins  [facility]  08/07/2020, 8:37 AM

## 2020-08-07 NOTE — Evaluation (Signed)
Physical Therapy Evaluation Patient Details Name: Yolanda Peters MRN: [id number] DOB: 1940/01/30 Today's Date: 08/07/2020   History of Present Illness  80 yo female with onset of wound on R lower leg was given first aid but devolved into a large wound over the anterolateral leg.  Debrided on 7/28, wound vac applied and permitted to walk.  PMHx:  Fall on 7/17 with RLE wound, atherosclerosis, cardiomegaly, osteopenia, HTN, RA, breast CA, R TKA  Clinical Impression  Pt was seen for initiation of mobility and demonstrates a good understanding of her needs for movement to succeed at home.  Reviewed ROM to BLE's and walked on the unit, and will add stairs to next visit at first of week.  Pt is having plastic surgery potentially on 8/2, so will plan more mobility before then to see how she does with and without an AD.  Follow for goals of acute PT.    Follow Up Recommendations Home health PT;Supervision for mobility/OOB    Equipment Recommendations  Rolling walker with 5" wheels    Recommendations for Other Services       Precautions / Restrictions Precautions Precautions: Fall Precaution Comments: wound vac Restrictions Weight Bearing Restrictions: Yes RLE Weight Bearing: Weight bearing as tolerated      Mobility  Bed Mobility Overal bed mobility: Needs Assistance Bed Mobility: Supine to Sit     Supine to sit: Min assist     General bed mobility comments: mainly help with lines    Transfers Overall transfer level: Needs assistance Equipment used: 1 person hand held assist Transfers: Sit to/from Stand Sit to Stand: Min assist         General transfer comment: min assist with pt tending to list backward  Ambulation/Gait Ambulation/Gait assistance: Min assist;Min guard Gait Distance (Feet): 140 Feet Assistive device: 1 person hand held assist;IV Pole Gait Pattern/deviations: Step-to pattern;Step-through pattern;Decreased stride length;Decreased stance time - right;Narrow  base of support Gait velocity: controlled Gait velocity interpretation: <1.31 ft/sec, indicative of household ambulator General Gait Details: pt took her time to walk with controlled pace on RLE with IV pole, but used it for light support.  May need to try [facility] or walker if this continues  Stairs            Wheelchair Mobility    Modified Rankin (Stroke Patients Only)       Balance Overall balance assessment: History of Falls;Needs assistance Sitting-balance support: Feet supported Sitting balance-Leahy Scale: Good     Standing balance support: Bilateral upper extremity supported;During functional activity Standing balance-Leahy Scale: Poor Standing balance comment: moments of LOB in standing unless holding support                             Pertinent Vitals/Pain Pain Assessment: Faces Faces Pain Scale: Hurts a little bit Pain Location: R lower leg Pain Descriptors / Indicators: Operative site guarding Pain Intervention(s): Monitored during session;Repositioned    Home Living Family/patient expects to be discharged to:: Private residence Living Arrangements: Spouse/significant other Available Help at Discharge: Family;Available 24 hours/day Type of Home: House Home Access: Stairs to enter Entrance Stairs-Rails: Can reach both Entrance Stairs-Number of Steps: 2 or 5 Home Layout: One level Home Equipment: Walker - 2 wheels;Walker - 4 wheels;Cane - single point;Bedside commode;Shower seat;Grab bars - toilet;Grab bars - tub/shower Additional Comments: huaband requires a walker and pt is usually his caregiver    Prior Function Level of Independence: Independent  Comments: pt was driving and in water aerobics prior to fall     Hand Dominance   Dominant Hand: Right    Extremity/Trunk Assessment   Upper Extremity Assessment Upper Extremity Assessment: Overall WFL for tasks assessed    Lower Extremity Assessment Lower Extremity  Assessment: RLE deficits/detail RLE Deficits / Details: anterolateral wound on R lower leg RLE Coordination: decreased gross motor    Cervical / Trunk Assessment Cervical / Trunk Assessment: Kyphotic (mild)  Communication   Communication: No difficulties  Cognition Arousal/Alertness: Awake/alert Behavior During Therapy: WFL for tasks assessed/performed Overall Cognitive Status: Within Functional Limits for tasks assessed                                 General Comments: motivated and pushing to do more      General Comments General comments (skin integrity, edema, etc.): Pt is on wound vac and with IV with good tolerance for initial gait as well as good motivation    Exercises General Exercises - Lower Extremity Ankle Circles/Pumps: AROM;Both;5 reps Quad Sets: AROM;Both;10 reps Gluteal Sets: AROM;Both;10 reps   Assessment/Plan    PT Assessment Patient needs continued PT services  PT Problem List Decreased strength;Decreased range of motion;Decreased activity tolerance;Decreased balance;Decreased mobility;Decreased coordination;Decreased skin integrity;Pain       PT Treatment Interventions DME instruction;Gait training;Stair training;Functional mobility training;Therapeutic activities;Therapeutic exercise;Balance training;Neuromuscular re-education;Patient/family education    PT Goals (Current goals can be found in the Care Plan section)  Acute Rehab PT Goals Patient Stated Goal: to walk and go home PT Goal Formulation: With patient Time For Goal Achievement: 08/21/20 Potential to Achieve Goals: Good    Frequency Min 3X/week   Barriers to discharge Inaccessible home environment;Decreased caregiver support home with husband who cannot help and stairs to enter    Co-evaluation               AM-PAC PT "6 Clicks" Mobility  Outcome Measure Help needed turning from your back to your side while in a flat bed without using bedrails?: A Little Help  needed moving from lying on your back to sitting on the side of a flat bed without using bedrails?: A Little Help needed moving to and from a bed to a chair (including a wheelchair)?: A Little Help needed standing up from a chair using your arms (e.g., wheelchair or bedside chair)?: A Little Help needed to walk in hospital room?: A Little Help needed climbing 3-5 steps with a railing? : A Little 6 Click Score: 18    End of Session Equipment Utilized During Treatment: Gait belt Activity Tolerance: Patient tolerated treatment well;Treatment limited secondary to medical complications (Comment) Patient left: in chair;with call bell/phone within reach;with chair alarm set Nurse Communication: Mobility status PT Visit Diagnosis: Unsteadiness on feet (R26.81);Muscle weakness (generalized) (M62.81);Pain Pain - Right/Left: Right Pain - part of body: Leg    Time: 1105-1146 PT Time Calculation (min) (ACUTE ONLY): 41 min   Charges:   PT Evaluation $PT Eval Moderate Complexity: 1 Mod PT Treatments $Gait Training: 8-22 mins $Therapeutic Exercise: 8-22 mins       Ramond Dial 08/07/2020, 1:33 PM  Mee Hives, PT MS Acute Rehab Dept. Number: [place] and [place]

## 2020-08-07 NOTE — Care Management Important Message (Signed)
Important Message  Patient Details  Name: Yolanda Peters MRN: [id number] Date of Birth: 04/24/1940   Medicare Important Message Given:  Yes     Memory Argue 08/07/2020, 5:36 PM

## 2020-08-08 DIAGNOSIS — L03115 Cellulitis of right lower limb: Secondary | ICD-10-CM | POA: Diagnosis not present

## 2020-08-08 LAB — BASIC METABOLIC PANEL
Anion gap: 10 (ref 5–15)
BUN: 21 mg/dL (ref 8–23)
CO2: 23 mmol/L (ref 22–32)
Calcium: 8.3 mg/dL — ABNORMAL LOW (ref 8.9–10.3)
Chloride: 104 mmol/L (ref 98–111)
Creatinine, Ser: 0.71 mg/dL (ref 0.44–1.00)
GFR, Estimated: >60 mL/min (ref >60)
Glucose, Bld: 124 mg/dL — ABNORMAL HIGH (ref 70–99)
Potassium: 3.5 mmol/L (ref 3.5–5.1)
Sodium: 137 mmol/L (ref 135–145)

## 2020-08-08 NOTE — Progress Notes (Signed)
Patient ID: Yolanda Peters, female   DOB: 18-Jun-1940, 80 y.o.   MRN: [id number] Patient's wound VAC became clogged last night and was turned off this morning.  I will replace the wound VAC dressing this morning.  Patient has no complaints.  Plan for skin graft Tuesday or Wednesday.

## 2020-08-08 NOTE — Progress Notes (Signed)
[facility] PROGRESS NOTE    Progress Note  Yolanda Peters  [id number] DOB: 1940-02-12 DOA: 08/04/2020 PCP: Shon Baton, MD     Brief Narrative:   Yolanda Peters is an 80 y.o. female past medical history significant for essential hypertension rheumatoid arthritis breast cancer in remission sent for laceration after a fall on 07/26/2020 since then has been coming crease erythematous and with purulent drainage   Assessment/Plan:   Cellulitis of right leg/ Ulcer of right leg, with necrosis of muscle: Started on Rocephin and IV vancomycin. Orthopedic surgery was consulted I&D was performed on 08/07/2018 wound VAC in place. Orthopedic surgery recommended return to the OR early next week. Physical therapy recommended home with home health PT. She relates her pain is controlled.  RA (rheumatoid arthritis) (HCC) Noted, on no meds.  Essential HTN (hypertension) Continue verapamil.  Blood pressure is well controlled and stable.   DVT prophylaxis: lovenox Family Communication:none Status is: Inpatient  Remains inpatient appropriate because:Hemodynamically unstable  Dispo: The patient is from: Home              Anticipated d/c is to: Home              Patient currently is not medically stable to d/c.   Difficult to place patient No        Code Status:     Code Status Orders  (From admission, onward)           Start     Ordered   08/05/20 0359  Full code  Continuous        08/05/20 0401           Code Status History     Date Active Date Inactive Code Status Order ID Comments User Context   06/26/2018 1225 06/27/2018 1416 Full Code [id number]  Yolanda Orleans, PA-C Inpatient         IV Access:   Peripheral IV   Procedures and diagnostic studies:   No results found.   Medical Consultants:   None.   Subjective:    Yolanda Peters no pain feels great.  Objective:    Vitals:   08/07/20 2051 08/07/20 2357 08/08/20 0551 08/08/20 0722   BP: 131/77 (!) 151/77 128/76 (!) 147/76  Pulse: 83 85 73 75  Resp: '17 17 17 18  '$ Temp: 98.3 F (36.8 C) (!) 97.5 F (36.4 C) 97.7 F (36.5 C) 98 F (36.7 C)  TempSrc: Oral Oral Oral Oral  SpO2: 92% 93% 95% 93%  Weight:      Height:       SpO2: 93 % O2 Flow Rate (L/min): 2 L/min   Intake/Output Summary (Last 24 hours) at 08/08/2020 1027 Last data filed at 08/08/2020 0600 Gross per 24 hour  Intake 574.41 ml  Output 100 ml  Net 474.41 ml    Filed Weights   08/05/20 0904  Weight: 70.4 kg    Exam: General exam: In no acute distress. Respiratory system: Good air movement and clear to auscultation. Cardiovascular system: S1 & S2 heard, RRR. No JVD. Gastrointestinal system: Abdomen is nondistended, soft and nontender.  Extremities: No pedal edema. Skin: No rashes, lesions or ulcers  Data Reviewed:    Labs: Basic Metabolic Panel: Recent Labs  Lab 08/05/20 0004 08/05/20 0703 08/08/20 0238  NA 142 141 137  K 4.1 3.6 3.5  CL 105 102 104  CO2 '26 26 23  '$ GLUCOSE 108* 94 124*  BUN 33* 22 21  CREATININE 1.00  0.60 0.71  CALCIUM 9.3 8.9 8.3*    GFR Estimated Creatinine Clearance: 54.9 mL/min (by C-G formula based on SCr of 0.71 mg/dL). Liver Function Tests: Recent Labs  Lab 08/05/20 0004  AST 17  ALT 16  ALKPHOS 87  BILITOT 1.0  PROT 7.6  ALBUMIN 4.0    No results for input(s): LIPASE, AMYLASE in the last 168 hours. No results for input(s): AMMONIA in the last 168 hours. Coagulation profile Recent Labs  Lab 08/05/20 0004  INR 1.0    COVID-19 Labs  No results for input(s): DDIMER, FERRITIN, LDH, CRP in the last 72 hours.  Lab Results  Component Value Date   Yolanda Peters NEGATIVE 08/05/2020   SARSCOV2NAA NOT DETECTED 06/22/2018    CBC: Recent Labs  Lab 08/05/20 0004 08/05/20 0703  WBC 8.7 7.0  NEUTROABS 5.6  --   HGB 12.5 12.2  HCT 39.7 38.0  MCV 90.6 89.6  PLT 352 298    Cardiac Enzymes: No results for input(s): CKTOTAL, CKMB,  CKMBINDEX, TROPONINI in the last 168 hours. BNP (last 3 results) No results for input(s): PROBNP in the last 8760 hours. CBG: No results for input(s): GLUCAP in the last 168 hours. D-Dimer: No results for input(s): DDIMER in the last 72 hours. Hgb A1c: No results for input(s): HGBA1C in the last 72 hours. Lipid Profile: No results for input(s): CHOL, HDL, LDLCALC, TRIG, CHOLHDL, LDLDIRECT in the last 72 hours. Thyroid function studies: No results for input(s): TSH, T4TOTAL, T3FREE, THYROIDAB in the last 72 hours.  Invalid input(s): FREET3 Anemia work up: No results for input(s): VITAMINB12, FOLATE, FERRITIN, TIBC, IRON, RETICCTPCT in the last 72 hours. Sepsis Labs: Recent Labs  Lab 08/05/20 0004 08/05/20 0703  WBC 8.7 7.0  LATICACIDVEN 1.1  --     Microbiology Recent Results (from the past 240 hour(s))  Blood Culture (routine x 2)     Status: None (Preliminary result)   Collection Time: 08/05/20 12:06 AM   Specimen: BLOOD  Result Value Ref Range Status   Specimen Description   Final    BLOOD LEFT ANTECUBITAL Performed at [facility], [place] [address], [place], [place] [address]    Special Requests   Final    BOTTLES DRAWN AEROBIC AND ANAEROBIC Blood Culture results may not be optimal due to an inadequate volume of blood received in culture bottles Performed at [facility] [address], [place], [place] [address]    Culture   Final    NO GROWTH 1 DAY Performed at [facility], [place] [address] [place], [place] [address]    Report Status PENDING  Incomplete  Blood Culture (routine x 2)     Status: None (Preliminary result)   Collection Time: 08/05/20 12:06 AM   Specimen: BLOOD  Result Value Ref Range Status   Specimen Description   Final    BLOOD RIGHT ANTECUBITAL Performed at [place] [address] [place], [place] [address]    Special Requests   Final    BOTTLES DRAWN AEROBIC AND ANAEROBIC  Blood Culture results may not be optimal due to an excessive volume of blood received in culture bottles Performed at [place] [address] [place], [place] [address]    Culture   Final    NO GROWTH 1 DAY Performed at [facility], [place] [address], [place], [place] [address]    Report Status PENDING  Incomplete  SARS CORONAVIRUS 2 (TAT 6-24  HRS) Nasopharyngeal Nasopharyngeal Swab     Status: None   Collection Time: 08/05/20  1:31 AM   Specimen: Nasopharyngeal Swab  Result Value Ref Range Status   SARS Coronavirus 2 NEGATIVE NEGATIVE Final    Comment: (NOTE) SARS-CoV-2 target nucleic acids are NOT DETECTED.  The SARS-CoV-2 RNA is generally detectable in upper and lower respiratory specimens during the acute phase of infection. Negative results do not preclude SARS-CoV-2 infection, do not rule out co-infections with other pathogens, and should not be used as the sole basis for treatment or other patient management decisions. Negative results must be combined with clinical observations, patient history, and epidemiological information. The expected result is Negative.  Fact Sheet for Patients: SugarRoll.be  Fact Sheet for Healthcare Providers: https://www.woods-mathews.com/  This test is not yet approved or cleared by the [place] FDA and  has been authorized for detection and/or diagnosis of SARS-CoV-2 by FDA under an Emergency Use Authorization (EUA). This EUA will remain  in effect (meaning this test can be used) for the duration of the COVID-19 declaration under Se ction 564(b)(1) of the Act, 21 U.S.C. section 360bbb-3(b)(1), unless the authorization is terminated or revoked sooner.  Performed at [facility], [place] [address], [place], [place] [address]   MRSA Next Gen by PCR, Nasal     Status: None   Collection Time: 08/05/20  9:07 AM   Specimen: Nasal Mucosa; Nasal Swab  Result Value Ref  Range Status   MRSA by PCR Next Gen NOT DETECTED NOT DETECTED Final    Comment: (NOTE) The GeneXpert MRSA Assay (FDA approved for NASAL specimens only), is one component of a comprehensive MRSA colonization surveillance program. It is not intended to diagnose MRSA infection nor to guide or monitor treatment for MRSA infections. Test performance is not FDA approved in patients less than 31 years old. Performed at [facility], [place] [address], [place], [place] [address]   Surgical pcr screen     Status: None   Collection Time: 08/05/20  9:07 AM   Specimen: Nasal Mucosa; Nasal Swab  Result Value Ref Range Status   MRSA, PCR NEGATIVE NEGATIVE Final   Staphylococcus aureus NEGATIVE NEGATIVE Final    Comment: (NOTE) The Xpert SA Assay (FDA approved for NASAL specimens in patients 77 years of age and older), is one component of a comprehensive surveillance program. It is not intended to diagnose infection nor to guide or monitor treatment. Performed at [facility], [place] [address] [place], [place] [address]   Aerobic/Anaerobic Culture w Gram Stain (surgical/deep wound)     Status: None (Preliminary result)   Collection Time: 08/06/20  5:53 PM   Specimen: Soft Tissue, Other  Result Value Ref Range Status   Specimen Description TISSUE RIGHT LEG  Final   Special Requests NONE  Final   Gram Stain   Final    MODERATE WBC PRESENT, PREDOMINANTLY PMN NO ORGANISMS SEEN Performed at [facility], [place] [address], [place], [place] [address]    Culture PENDING  Incomplete   Report Status PENDING  Incomplete     Medications:    vitamin C  1,000 mg Oral Daily   cholecalciferol  2,000 Units Oral Daily   docusate sodium  100 mg Oral BID   enoxaparin (LOVENOX) injection  40 mg Subcutaneous Q24H   trimethoprim  100 mg Oral Daily   verapamil  360 mg Oral Daily   vitamin A  20,000 Units Oral Daily  vitamin E  1,000 Units Oral Daily   Continuous  Infusions:  sodium chloride 10 mL/hr at 08/08/20 0511   cefTRIAXone (ROCEPHIN)  IV 2 g (08/08/20 0512)   methocarbamol (ROBAXIN) IV     vancomycin 1,250 mg (08/07/20 2108)      LOS: 3 days   Charlynne Cousins  [facility]  08/08/2020, 10:27 AM

## 2020-08-08 NOTE — Plan of Care (Signed)
  Problem: Activity: Goal: Risk for activity intolerance will decrease Outcome: Progressing   Problem: Pain Managment: Goal: General experience of comfort will improve Outcome: Progressing   

## 2020-08-08 NOTE — Progress Notes (Signed)
Received return call from Dr. Lorin Mercy. Dr. Lorin Mercy states that Dr. Sharol Given will evaluate in am, to not remove the dressing.

## 2020-08-08 NOTE — Progress Notes (Signed)
Physical Therapy Treatment Patient Details Name: Yolanda Peters MRN: [id number] DOB: [birth year] Today's Date: 08/08/2020    History of Present Illness 80 yo female with onset of wound on R lower leg was given first aid but devolved into a large wound over the anterolateral leg.  Debrided on 7/28, wound vac applied and permitted to walk.  PMHx:  Fall on 7/17 with RLE wound, atherosclerosis, cardiomegaly, osteopenia, HTN, RA, breast CA, R TKA    PT Comments    Pt is progressing well towards goals. She increased ambulation distance today and performed HEP. Pt is very motivated to recover and return home to caring for her husband.  Plan to continue to progress gait and balance. Will continue to follow acutely.    Follow Up Recommendations  Home health PT;Supervision for mobility/OOB     Equipment Recommendations  Rolling walker with 5" wheels    Recommendations for Other Services       Precautions / Restrictions Precautions Precautions: Fall Precaution Comments: wound vac Restrictions Weight Bearing Restrictions: Yes RLE Weight Bearing: Weight bearing as tolerated    Mobility  Bed Mobility Overal bed mobility: Needs Assistance             General bed mobility comments: up in chair on arrival    Transfers Overall transfer level: Needs assistance   Transfers: Sit to/from Stand Sit to Stand: Min assist         General transfer comment: min assist to rise from recliner chair with use of arm rests  Ambulation/Gait Ambulation/Gait assistance: Min guard Gait Distance (Feet): 500 Feet Assistive device: IV Pole;None Gait Pattern/deviations: Step-through pattern;Decreased stride length;Narrow base of support Gait velocity: controlled Gait velocity interpretation: 1.31 - 2.62 ft/sec, indicative of limited community ambulator General Gait Details: Pt initiated gait with IV pole for support and transitioned to no AD. Noted 2x staggering, pt reported this was due to hip  pain and not dizziness. VC for heel strike/toe off on RLE.   Stairs             Wheelchair Mobility    Modified Rankin (Stroke Patients Only)       Balance Overall balance assessment: History of Falls;Needs assistance Sitting-balance support: Feet supported Sitting balance-Leahy Scale: Good     Standing balance support: Bilateral upper extremity supported;During functional activity Standing balance-Leahy Scale: Poor                              Cognition Arousal/Alertness: Awake/alert Behavior During Therapy: WFL for tasks assessed/performed Overall Cognitive Status: Within Functional Limits for tasks assessed                                        Exercises General Exercises - Lower Extremity Ankle Circles/Pumps: AROM;Both;10 reps;Seated Long Arc Quad: AROM;Both;10 reps;Seated Heel Slides: AROM;Both;10 reps;Seated    General Comments        Pertinent Vitals/Pain Pain Assessment: Faces Faces Pain Scale: Hurts a little bit Pain Location: R lower leg Pain Descriptors / Indicators: Operative site guarding Pain Intervention(s): Monitored during session;Limited activity within patient's tolerance;Premedicated before session    Home Living                      Prior Function            PT Goals (current goals can  now be found in the care plan section) Acute Rehab PT Goals Patient Stated Goal: to walk and go home PT Goal Formulation: With patient Time For Goal Achievement: 08/21/20 Potential to Achieve Goals: Good Progress towards PT goals: Progressing toward goals    Frequency    Min 3X/week      PT Plan Current plan remains appropriate    Co-evaluation              AM-PAC PT "6 Clicks" Mobility   Outcome Measure  Help needed turning from your back to your side while in a flat bed without using bedrails?: A Little Help needed moving from lying on your back to sitting on the side of a flat bed  without using bedrails?: A Little Help needed moving to and from a bed to a chair (including a wheelchair)?: A Little Help needed standing up from a chair using your arms (e.g., wheelchair or bedside chair)?: A Little Help needed to walk in hospital room?: A Little Help needed climbing 3-5 steps with a railing? : A Little 6 Click Score: 18    End of Session Equipment Utilized During Treatment: Gait belt Activity Tolerance: Patient tolerated treatment well;Treatment limited secondary to medical complications (Comment) Patient left: in chair;with call bell/phone within reach Nurse Communication: Mobility status PT Visit Diagnosis: Unsteadiness on feet (R26.81);Muscle weakness (generalized) (M62.81);Pain Pain - Right/Left: Right Pain - part of body: Leg     Time: 1306-1330 PT Time Calculation (min) (ACUTE ONLY): 24 min  Charges:  $Gait Training: 8-22 mins $Therapeutic Exercise: 8-22 mins                     Benjiman Core, [place] Pager [phone] Acute Rehab  Allena Katz 08/08/2020, 1:38 PM

## 2020-08-08 NOTE — Progress Notes (Signed)
Patient's wound vac malfunctioning. Reading blockage. Wound vac troubleshooting occurred with 2 RN's. Unable to fix the blockage and retain functionality. Patient's order for wound vac is to not change dressing. Dr. Lorin Mercy, on call for Dr. Sharol Given paged via answering service.

## 2020-08-09 DIAGNOSIS — L03115 Cellulitis of right lower limb: Secondary | ICD-10-CM | POA: Diagnosis not present

## 2020-08-09 NOTE — Progress Notes (Signed)
[facility] PROGRESS NOTE    Progress Note  [name]  [id number] DOB: January 28, 1940 DOA: 08/04/2020 PCP: Shon Baton, MD     Brief Narrative:   Yolanda Peters is an 80 y.o. female past medical history significant for essential hypertension rheumatoid arthritis breast cancer in remission sent for laceration after a fall on 07/26/2020 since then has been coming crease erythematous and with purulent drainage   Assessment/Plan:   Cellulitis of right leg/ Ulcer of right leg, with necrosis of muscle: Started on Rocephin and IV vancomycin. She continues to remain afebrile with no leukocytosis. Orthopedic surgery was consulted I&D was performed on 08/07/2018 wound VAC in place. Orthopedic surgery recommended return to the OR early next week. Physical therapy recommended home with home health PT. She relates her pain is controlled.  RA (rheumatoid arthritis) (HCC) Noted, on no meds.  Essential HTN (hypertension) Continue verapamil.  Blood pressure is well controlled and stable.   DVT prophylaxis: lovenox Family Communication:none Status is: Inpatient  Remains inpatient appropriate because:Hemodynamically unstable  Dispo: The patient is from: Home              Anticipated d/c is to: Home              Patient currently is not medically stable to d/c.   Difficult to place patient No        Code Status:     Code Status Orders  (From admission, onward)           Start     Ordered   08/05/20 0359  Full code  Continuous        08/05/20 0401           Code Status History     Date Active Date Inactive Code Status Order ID Comments User Context   06/26/2018 1225 06/27/2018 1416 Full Code [id number]  Danae Orleans, PA-C Inpatient         IV Access:   Peripheral IV   Procedures and diagnostic studies:   No results found.   Medical Consultants:   None.   Subjective:    Deshanae Bice Veilleux no complains  Objective:    Vitals:    08/08/20 2133 08/09/20 0025 08/09/20 0404 08/09/20 0851  BP: 132/69 128/72 (!) 141/71 136/77  Pulse: 84 84 75 84  Resp: '16 18 17 18  '$ Temp: (!) 97.4 F (36.3 C) 98 F (36.7 C) 97.7 F (36.5 C) 98 F (36.7 C)  TempSrc: Oral Oral Oral Oral  SpO2: 95% 96% 95% 96%  Weight:      Height:       SpO2: 96 % O2 Flow Rate (L/min): 2 L/min   Intake/Output Summary (Last 24 hours) at 08/09/2020 1005 Last data filed at 08/09/2020 0541 Gross per 24 hour  Intake 954.17 ml  Output 360 ml  Net 594.17 ml    Filed Weights   08/05/20 0904  Weight: 70.4 kg    Exam: General exam: In no acute distress. Respiratory system: Good air movement and clear to auscultation. Cardiovascular system: S1 & S2 heard, RRR. No JVD. Gastrointestinal system: Abdomen is nondistended, soft and nontender.  Extremities: No pedal edema. Skin: No rashes, lesions or ulcers  Data Reviewed:    Labs: Basic Metabolic Panel: Recent Labs  Lab 08/05/20 0004 08/05/20 0703 08/08/20 0238  NA 142 141 137  K 4.1 3.6 3.5  CL 105 102 104  CO2 '26 26 23  '$ GLUCOSE 108* 94 124*  BUN 33*  22 21  CREATININE 1.00 0.60 0.71  CALCIUM 9.3 8.9 8.3*    GFR Estimated Creatinine Clearance: 54.9 mL/min (by C-G formula based on SCr of 0.71 mg/dL). Liver Function Tests: Recent Labs  Lab 08/05/20 0004  AST 17  ALT 16  ALKPHOS 87  BILITOT 1.0  PROT 7.6  ALBUMIN 4.0    No results for input(s): LIPASE, AMYLASE in the last 168 hours. No results for input(s): AMMONIA in the last 168 hours. Coagulation profile Recent Labs  Lab 08/05/20 0004  INR 1.0    COVID-19 Labs  No results for input(s): DDIMER, FERRITIN, LDH, CRP in the last 72 hours.  Lab Results  Component Value Date   [place] NEGATIVE 08/05/2020   SARSCOV2NAA NOT DETECTED 06/22/2018    CBC: Recent Labs  Lab 08/05/20 0004 08/05/20 0703  WBC 8.7 7.0  NEUTROABS 5.6  --   HGB 12.5 12.2  HCT 39.7 38.0  MCV 90.6 89.6  PLT 352 298    Cardiac  Enzymes: No results for input(s): CKTOTAL, CKMB, CKMBINDEX, TROPONINI in the last 168 hours. BNP (last 3 results) No results for input(s): PROBNP in the last 8760 hours. CBG: No results for input(s): GLUCAP in the last 168 hours. D-Dimer: No results for input(s): DDIMER in the last 72 hours. Hgb A1c: No results for input(s): HGBA1C in the last 72 hours. Lipid Profile: No results for input(s): CHOL, HDL, LDLCALC, TRIG, CHOLHDL, LDLDIRECT in the last 72 hours. Thyroid function studies: No results for input(s): TSH, T4TOTAL, T3FREE, THYROIDAB in the last 72 hours.  Invalid input(s): FREET3 Anemia work up: No results for input(s): VITAMINB12, FOLATE, FERRITIN, TIBC, IRON, RETICCTPCT in the last 72 hours. Sepsis Labs: Recent Labs  Lab 08/05/20 0004 08/05/20 0703  WBC 8.7 7.0  LATICACIDVEN 1.1  --     Microbiology Recent Results (from the past 240 hour(s))  Blood Culture (routine x 2)     Status: None (Preliminary result)   Collection Time: 08/05/20 12:06 AM   Specimen: BLOOD  Result Value Ref Range Status   Specimen Description   Final    BLOOD LEFT ANTECUBITAL Performed at [facility], [place] [address] [place], [place] [address]    Special Requests   Final    BOTTLES DRAWN AEROBIC AND ANAEROBIC Blood Culture results may not be optimal due to an inadequate volume of blood received in culture bottles Performed at [place] [address], [place], [place] [address]    Culture   Final    NO GROWTH 3 DAYS Performed at [facility], [place] [address], [place], [place] [address]    Report Status PENDING  Incomplete  Blood Culture (routine x 2)     Status: None (Preliminary result)   Collection Time: 08/05/20 12:06 AM   Specimen: BLOOD  Result Value Ref Range Status   Specimen Description   Final    BLOOD RIGHT ANTECUBITAL Performed at [place] [address] [place], [place] [address]    Special Requests    Final    BOTTLES DRAWN AEROBIC AND ANAEROBIC Blood Culture results may not be optimal due to an excessive volume of blood received in culture bottles Performed at [place] [address] [place], [place] [address]    Culture   Final    NO GROWTH 3 DAYS Performed at [facility], [place] [address] [place],  [address]    Report Status PENDING  Incomplete  SARS CORONAVIRUS 2 (TAT 6-24 HRS) Nasopharyngeal Nasopharyngeal Swab     Status: None   Collection Time: 08/05/20  1:31 AM   Specimen: Nasopharyngeal Swab  Result Value Ref Range Status   SARS Coronavirus 2 NEGATIVE NEGATIVE Final    Comment: (NOTE) SARS-CoV-2 target nucleic acids are NOT DETECTED.  The SARS-CoV-2 RNA is generally detectable in upper and lower respiratory specimens during the acute phase of infection. Negative results do not preclude SARS-CoV-2 infection, do not rule out co-infections with other pathogens, and should not be used as the sole basis for treatment or other patient management decisions. Negative results must be combined with clinical observations, patient history, and epidemiological information. The expected result is Negative.  Fact Sheet for Patients: SugarRoll.be  Fact Sheet for Healthcare Providers: https://www.woods-mathews.com/  This test is not yet approved or cleared by the [place] FDA and  has been authorized for detection and/or diagnosis of SARS-CoV-2 by FDA under an Emergency Use Authorization (EUA). This EUA will remain  in effect (meaning this test can be used) for the duration of the COVID-19 declaration under Se ction 564(b)(1) of the Act, 21 U.S.C. section 360bbb-3(b)(1), unless the authorization is terminated or revoked sooner.  Performed at [facility], [place] [address], [place], [place] [address]   MRSA Next Gen by PCR, Nasal     Status: None   Collection Time: 08/05/20  9:07 AM    Specimen: Nasal Mucosa; Nasal Swab  Result Value Ref Range Status   MRSA by PCR Next Gen NOT DETECTED NOT DETECTED Final    Comment: (NOTE) The GeneXpert MRSA Assay (FDA approved for NASAL specimens only), is one component of a comprehensive MRSA colonization surveillance program. It is not intended to diagnose MRSA infection nor to guide or monitor treatment for MRSA infections. Test performance is not FDA approved in patients less than 62 years old. Performed at [facility], [place] [address] [place], [place] [address]   Surgical pcr screen     Status: None   Collection Time: 08/05/20  9:07 AM   Specimen: Nasal Mucosa; Nasal Swab  Result Value Ref Range Status   MRSA, PCR NEGATIVE NEGATIVE Final   Staphylococcus aureus NEGATIVE NEGATIVE Final    Comment: (NOTE) The Xpert SA Assay (FDA approved for NASAL specimens in patients 63 years of age and older), is one component of a comprehensive surveillance program. It is not intended to diagnose infection nor to guide or monitor treatment. Performed at [facility], [place] [address] [place], [place] [address]   Aerobic/Anaerobic Culture w Gram Stain (surgical/deep wound)     Status: None (Preliminary result)   Collection Time: 08/06/20  5:53 PM   Specimen: Soft Tissue, Other  Result Value Ref Range Status   Specimen Description TISSUE RIGHT LEG  Final   Special Requests NONE  Final   Gram Stain   Final    MODERATE WBC PRESENT, PREDOMINANTLY PMN NO ORGANISMS SEEN Performed at [facility], [place] [address] [place], [place] [address]    Culture   Final    RARE STREPTOCOCCUS AGALACTIAE TESTING AGAINST S. AGALACTIAE NOT ROUTINELY PERFORMED DUE TO PREDICTABILITY OF AMP/PEN/VAN SUSCEPTIBILITY. NO ANAEROBES ISOLATED; CULTURE IN PROGRESS FOR 5 DAYS    Report Status PENDING  Incomplete     Medications:    vitamin C  1,000 mg Oral Daily   cholecalciferol  2,000 Units Oral Daily   docusate  sodium  100 mg Oral BID  enoxaparin (LOVENOX) injection  40 mg Subcutaneous Q24H   trimethoprim  100 mg Oral Daily   verapamil  360 mg Oral Daily   vitamin A  20,000 Units Oral Daily   vitamin E  1,000 Units Oral Daily   Continuous Infusions:  sodium chloride 10 mL/hr at 08/08/20 0511   cefTRIAXone (ROCEPHIN)  IV 2 g (08/09/20 0522)   methocarbamol (ROBAXIN) IV     vancomycin 1,250 mg (08/08/20 2129)      LOS: 4 days   Charlynne Cousins  [facility]  08/09/2020, 10:05 AM

## 2020-08-09 NOTE — Progress Notes (Signed)
VAC working , in Psychologist, occupational, ready for Tuesday surgery .

## 2020-08-09 NOTE — Progress Notes (Signed)
Pharmacy Antibiotic Note  Yolanda Peters is a 80 y.o. female admitted on 08/04/2020 with  wound infection .  Pharmacy has been consulted for Vanco dosing.  ID: Cellulitis & wound of R leg (from fall). Afebrile. WBC WNL. 7/28 s/p I&D and wound vac in place Planning skin grafts early this week   Antimicrobials this admission:  7/27 Vanc >> 7/27 CTX>> 7/29 Trimethoprim (PTA)  Microbiology results:  7/27 BCx2:neg 7/27 MRSA PCR: neg 7/28 Tissue cx: rare strep agalactiae  Dosage changes 7/27: Vanc 1g IV Q24h (SCr rounded to 1, est AUC 531) 7/28: Incr Vanco '1250mg'$  IV q24 (Scr 0.8, AUC 508)  Plan: Continue Vanco to '1250mg'$  IV q24h (Scr 0.8, AUC 508) Will obtain VP/VT with next dose VP 8/1 @ 0030 VT 8/1 @ 2130  Skin graft tentatively on 8/3   Height: '5\' 4"'$  (162.6 cm) Weight: 70.4 kg (155 lb 3.3 oz) IBW/kg (Calculated) : 54.7  Temp (24hrs), Avg:97.8 F (36.6 C), Min:97.4 F (36.3 C), Max:98.1 F (36.7 C)  Recent Labs  Lab 08/05/20 0004 08/05/20 0703 08/08/20 0238  WBC 8.7 7.0  --   CREATININE 1.00 0.60 0.71  LATICACIDVEN 1.1  --   --      Estimated Creatinine Clearance: 54.9 mL/min (by C-G formula based on SCr of 0.71 mg/dL).    Allergies  Allergen Reactions   Penicillins Nausea Only and Swelling    Fever Did it involve swelling of the face/tongue/throat, SOB, or low BP? Unknown Did it involve sudden or severe rash/hives, skin peeling, or any reaction on the inside of your mouth or nose? No Did you need to seek medical attention at a hospital or doctor's office? Yes When did it last happen?      60+ years ago If all above answers are "NO", may proceed with cephalosporin use.    Tetracyclines & Related Nausea Only   Lestine Box, PharmD PGY2 Infectious Diseases Pharmacy Resident   Please check [email or web address] for unit-specific pharmacy phone numbers

## 2020-08-10 ENCOUNTER — Other Ambulatory Visit: Payer: Self-pay | Admitting: Physician Assistant

## 2020-08-10 DIAGNOSIS — L03115 Cellulitis of right lower limb: Secondary | ICD-10-CM | POA: Diagnosis not present

## 2020-08-10 LAB — CULTURE, BLOOD (ROUTINE X 2)
Culture: NO GROWTH
Culture: NO GROWTH

## 2020-08-10 LAB — SURGICAL PCR SCREEN
MRSA, PCR: NEGATIVE
Staphylococcus aureus: NEGATIVE

## 2020-08-10 LAB — VANCOMYCIN, TROUGH: Vancomycin Tr: 9 ug/mL — ABNORMAL LOW (ref 15–20)

## 2020-08-10 LAB — VANCOMYCIN, PEAK: Vancomycin Pk: 27 ug/mL — ABNORMAL LOW (ref 30–40)

## 2020-08-10 NOTE — Progress Notes (Signed)
Pharmacy Antibiotic Note  Yolanda Peters is a 80 y.o. female admitted on 08/04/2020 with  wound infection .  Pharmacy has been consulted for Vanco dosing.  A measured Vancomycin peak and trough were 27 and 9 respectively, with a calculated AUC of 406 - which is therapeutic and within goal range.   Plan: - Continue Vancomycin 1250 mg IV every 24 hours - Will continue to follow renal function, culture results, LOT, and antibiotic de-escalation plans   Height: '5\' 4"'$  (162.6 cm) Weight: 70.4 kg (155 lb 3.3 oz) IBW/kg (Calculated) : 54.7  Temp (24hrs), Avg:98.1 F (36.7 C), Min:97.9 F (36.6 C), Max:98.3 F (36.8 C)  Recent Labs  Lab 08/05/20 0004 08/05/20 0703 08/08/20 0238 08/10/20 0021 08/10/20 2147  WBC 8.7 7.0  --   --   --   CREATININE 1.00 0.60 0.71  --   --   LATICACIDVEN 1.1  --   --   --   --   VANCOTROUGH  --   --   --   --  9*  VANCOPEAK  --   --   --  27*  --      Estimated Creatinine Clearance: 54.9 mL/min (by C-G formula based on SCr of 0.71 mg/dL).    Allergies  Allergen Reactions   Penicillins Nausea Only and Swelling    Fever Did it involve swelling of the face/tongue/throat, SOB, or low BP? Unknown Did it involve sudden or severe rash/hives, skin peeling, or any reaction on the inside of your mouth or nose? No Did you need to seek medical attention at a hospital or doctor's office? Yes When did it last happen?      60+ years ago If all above answers are "NO", may proceed with cephalosporin use.    Tetracyclines & Related Nausea Only   7/27 Vanc >> 7/27 CTX>> 7/29 Trimethoprim from PTA (unlcear indication)   VP:8/1 @ 00:30 VT:8/1 @ 21:30   Microbiology results: 7/27 BCx2:neg 7/27 MRSA PCR: neg 7/28 Tissue >> rare strep agalactiae  Thank you for allowing pharmacy to be a part of this patient's care.  Alycia Rossetti, PharmD, BCPS Clinical Pharmacist Clinical phone for 08/10/2020: [phone] 08/10/2020 10:28 PM   **Pharmacist phone directory can now  be found on [email or web address] ([email or web address]).  Listed under [place].

## 2020-08-10 NOTE — Progress Notes (Signed)
[facility] PROGRESS NOTE    Progress Note  Yolanda Peters  [id number] DOB: March 01, 1940 DOA: 08/04/2020 PCP: Shon Baton, MD     Brief Narrative:   Yolanda Peters is an 80 y.o. female past medical history significant for essential hypertension rheumatoid arthritis breast cancer in remission sent for laceration after a fall on 07/26/2020 since then has been coming crease erythematous and with purulent drainage   Assessment/Plan:   Cellulitis of right leg/ Ulcer of right leg, with necrosis of muscle: Continue Rocephin and IV vancomycin. Orthopedic surgery was consulted I&D was performed on 08/07/2018 wound VAC in place. Orthopedic surgery recommended return to the OR early next week. Physical therapy recommended home with home health PT. She relates her pain is controlled.  RA (rheumatoid arthritis) (HCC) Noted, on no meds.  Essential HTN (hypertension) Continue verapamil.  Blood pressure is well controlled and stable.   DVT prophylaxis: lovenox Family Communication:none Status is: Inpatient  Remains inpatient appropriate because:Hemodynamically unstable  Dispo: The patient is from: Home              Anticipated d/c is to: Home              Patient currently is not medically stable to d/c.   Difficult to place patient No        Code Status:     Code Status Orders  (From admission, onward)           Start     Ordered   08/05/20 0359  Full code  Continuous        08/05/20 0401           Code Status History     Date Active Date Inactive Code Status Order ID Comments User Context   06/26/2018 1225 06/27/2018 1416 Full Code [id number]  Danae Orleans, PA-C Inpatient         IV Access:   Peripheral IV   Procedures and diagnostic studies:   No results found.   Medical Consultants:   None.   Subjective:    Yolanda Peters no complains  Objective:    Vitals:   08/09/20 1312 08/09/20 2017 08/10/20 0307 08/10/20 0824  BP: (!)  143/84 106/87 (!) 141/82 134/73  Pulse: 94 85 79 88  Resp: '20 17 16 16  '$ Temp:  98 F (36.7 C) 97.9 F (36.6 C) 97.9 F (36.6 C)  TempSrc:  Oral Oral Oral  SpO2: 100% 95% 97% 99%  Weight:      Height:       SpO2: 99 % O2 Flow Rate (L/min): 2 L/min   Intake/Output Summary (Last 24 hours) at 08/10/2020 0920 Last data filed at 08/10/2020 0116 Gross per 24 hour  Intake 780 ml  Output 10 ml  Net 770 ml    Filed Weights   08/05/20 0904  Weight: 70.4 kg    Exam: General exam: In no acute distress. Respiratory system: Good air movement and clear to auscultation. Cardiovascular system: S1 & S2 heard, RRR. No JVD. Gastrointestinal system: Abdomen is nondistended, soft and nontender.  Extremities: No pedal edema. Skin: No rashes, lesions or ulcers   Data Reviewed:    Labs: Basic Metabolic Panel: Recent Labs  Lab 08/05/20 0004 08/05/20 0703 08/08/20 0238  NA 142 141 137  K 4.1 3.6 3.5  CL 105 102 104  CO2 '26 26 23  '$ GLUCOSE 108* 94 124*  BUN 33* 22 21  CREATININE 1.00 0.60 0.71  CALCIUM 9.3 8.9  8.3*    GFR Estimated Creatinine Clearance: 54.9 mL/min (by C-G formula based on SCr of 0.71 mg/dL). Liver Function Tests: Recent Labs  Lab 08/05/20 0004  AST 17  ALT 16  ALKPHOS 87  BILITOT 1.0  PROT 7.6  ALBUMIN 4.0    No results for input(s): LIPASE, AMYLASE in the last 168 hours. No results for input(s): AMMONIA in the last 168 hours. Coagulation profile Recent Labs  Lab 08/05/20 0004  INR 1.0    COVID-19 Labs  No results for input(s): DDIMER, FERRITIN, LDH, CRP in the last 72 hours.  Lab Results  Component Value Date   [place] NEGATIVE 08/05/2020   SARSCOV2NAA NOT DETECTED 06/22/2018    CBC: Recent Labs  Lab 08/05/20 0004 08/05/20 0703  WBC 8.7 7.0  NEUTROABS 5.6  --   HGB 12.5 12.2  HCT 39.7 38.0  MCV 90.6 89.6  PLT 352 298    Cardiac Enzymes: No results for input(s): CKTOTAL, CKMB, CKMBINDEX, TROPONINI in the last 168 hours. BNP  (last 3 results) No results for input(s): PROBNP in the last 8760 hours. CBG: No results for input(s): GLUCAP in the last 168 hours. D-Dimer: No results for input(s): DDIMER in the last 72 hours. Hgb A1c: No results for input(s): HGBA1C in the last 72 hours. Lipid Profile: No results for input(s): CHOL, HDL, LDLCALC, TRIG, CHOLHDL, LDLDIRECT in the last 72 hours. Thyroid function studies: No results for input(s): TSH, T4TOTAL, T3FREE, THYROIDAB in the last 72 hours.  Invalid input(s): FREET3 Anemia work up: No results for input(s): VITAMINB12, FOLATE, FERRITIN, TIBC, IRON, RETICCTPCT in the last 72 hours. Sepsis Labs: Recent Labs  Lab 08/05/20 0004 08/05/20 0703  WBC 8.7 7.0  LATICACIDVEN 1.1  --     Microbiology Recent Results (from the past 240 hour(s))  Blood Culture (routine x 2)     Status: None (Preliminary result)   Collection Time: 08/05/20 12:06 AM   Specimen: BLOOD  Result Value Ref Range Status   Specimen Description   Final    BLOOD LEFT ANTECUBITAL Performed at [facility], [place] [address], [place], [place] [address]    Special Requests   Final    BOTTLES DRAWN AEROBIC AND ANAEROBIC Blood Culture results may not be optimal due to an inadequate volume of blood received in culture bottles Performed at [place] [address], [place], [place] [address]    Culture   Final    NO GROWTH 4 DAYS Performed at [facility], [place] [address], [place], [place] [address]    Report Status PENDING  Incomplete  Blood Culture (routine x 2)     Status: None (Preliminary result)   Collection Time: 08/05/20 12:06 AM   Specimen: BLOOD  Result Value Ref Range Status   Specimen Description   Final    BLOOD RIGHT ANTECUBITAL Performed at [place] [address] [place], [place] [address]    Special Requests   Final    BOTTLES DRAWN AEROBIC AND ANAEROBIC Blood Culture results may not be optimal due to an  excessive volume of blood received in culture bottles Performed at [place] [address] [place], [place] [address]    Culture   Final    NO GROWTH 4 DAYS Performed at [facility], [place] [address] [place], [place] [address]    Report Status PENDING  Incomplete  SARS CORONAVIRUS 2 (TAT 6-24 HRS) Nasopharyngeal Nasopharyngeal Swab  Status: None   Collection Time: 08/05/20  1:31 AM   Specimen: Nasopharyngeal Swab  Result Value Ref Range Status   SARS Coronavirus 2 NEGATIVE NEGATIVE Final    Comment: (NOTE) SARS-CoV-2 target nucleic acids are NOT DETECTED.  The SARS-CoV-2 RNA is generally detectable in upper and lower respiratory specimens during the acute phase of infection. Negative results do not preclude SARS-CoV-2 infection, do not rule out co-infections with other pathogens, and should not be used as the sole basis for treatment or other patient management decisions. Negative results must be combined with clinical observations, patient history, and epidemiological information. The expected result is Negative.  Fact Sheet for Patients: SugarRoll.be  Fact Sheet for Healthcare Providers: https://www.woods-mathews.com/  This test is not yet approved or cleared by the [place] FDA and  has been authorized for detection and/or diagnosis of SARS-CoV-2 by FDA under an Emergency Use Authorization (EUA). This EUA will remain  in effect (meaning this test can be used) for the duration of the COVID-19 declaration under Se ction 564(b)(1) of the Act, 21 U.S.C. section 360bbb-3(b)(1), unless the authorization is terminated or revoked sooner.  Performed at [facility], [place] [address] [place], [place] [address]   MRSA Next Gen by PCR, Nasal     Status: None   Collection Time: 08/05/20  9:07 AM   Specimen: Nasal Mucosa; Nasal Swab  Result Value Ref Range Status   MRSA by PCR Next Gen NOT DETECTED  NOT DETECTED Final    Comment: (NOTE) The GeneXpert MRSA Assay (FDA approved for NASAL specimens only), is one component of a comprehensive MRSA colonization surveillance program. It is not intended to diagnose MRSA infection nor to guide or monitor treatment for MRSA infections. Test performance is not FDA approved in patients less than 76 years old. Performed at [facility], [place] [address] [place], [place] [address]   Surgical pcr screen     Status: None   Collection Time: 08/05/20  9:07 AM   Specimen: Nasal Mucosa; Nasal Swab  Result Value Ref Range Status   MRSA, PCR NEGATIVE NEGATIVE Final   Staphylococcus aureus NEGATIVE NEGATIVE Final    Comment: (NOTE) The Xpert SA Assay (FDA approved for NASAL specimens in patients 45 years of age and older), is one component of a comprehensive surveillance program. It is not intended to diagnose infection nor to guide or monitor treatment. Performed at [facility], [place] [address] [place], [place] [address]   Aerobic/Anaerobic Culture w Gram Stain (surgical/deep wound)     Status: None (Preliminary result)   Collection Time: 08/06/20  5:53 PM   Specimen: Soft Tissue, Other  Result Value Ref Range Status   Specimen Description TISSUE RIGHT LEG  Final   Special Requests NONE  Final   Gram Stain   Final    MODERATE WBC PRESENT, PREDOMINANTLY PMN NO ORGANISMS SEEN Performed at [facility], [place] [address] [place], [place] [address]    Culture   Final    RARE STREPTOCOCCUS AGALACTIAE TESTING AGAINST S. AGALACTIAE NOT ROUTINELY PERFORMED DUE TO PREDICTABILITY OF AMP/PEN/VAN SUSCEPTIBILITY. NO ANAEROBES ISOLATED; CULTURE IN PROGRESS FOR 5 DAYS    Report Status PENDING  Incomplete     Medications:    vitamin C  1,000 mg Oral Daily   cholecalciferol  2,000 Units Oral Daily   docusate sodium  100 mg Oral BID   enoxaparin (LOVENOX) injection  40 mg Subcutaneous Q24H   trimethoprim  100  mg Oral Daily   verapamil  360 mg Oral Daily   vitamin A  20,000 Units Oral Daily   vitamin E  1,000 Units Oral Daily   Continuous Infusions:  sodium chloride 10 mL/hr at 08/08/20 0511   cefTRIAXone (ROCEPHIN)  IV 2 g (08/10/20 0526)   methocarbamol (ROBAXIN) IV     vancomycin 1,250 mg (08/09/20 2155)      LOS: 5 days   Charlynne Cousins  [facility]  08/10/2020, 9:20 AM

## 2020-08-10 NOTE — H&P (View-Only) (Signed)
Patient ID: Yolanda Peters, female   DOB: 09/15/1940, 80 y.o.   MRN: [id number] Patient has no complaints this morning she states she is able to walk the floor yesterday for 2 laps.  There is about 250 cc of clear serosanguineous drainage in the wound VAC canister.  Depending on operating room availability we will plan for split-thickness skin graft either tomorrow or Wednesday.

## 2020-08-10 NOTE — Progress Notes (Signed)
Physical Therapy Treatment Patient Details Name: Yolanda Peters MRN: [id number] DOB: 13-Jun-1940 Today's Date: 08/10/2020    History of Present Illness 80 yo female with onset of wound on R lower leg was given first aid but devolved into a large wound over the anterolateral leg.  Debrided on 7/28, wound vac applied and permitted to walk.  PMHx:  Fall on 7/17 with RLE wound, atherosclerosis, cardiomegaly, osteopenia, HTN, RA, breast CA, R TKA    PT Comments    Pt remains motivated and eager to ambulate to "get the circulation to my legs'. Pt ambulated from 6n to 6e and back without rest break or LOB. Pt planned to have skin graft tomorrow. PT to re-assess as appropriate s/p surgery.     Follow Up Recommendations  Home health PT;Supervision for mobility/OOB     Equipment Recommendations  Rolling walker with 5" wheels    Recommendations for Other Services       Precautions / Restrictions Precautions Precautions: Fall Precaution Comments: wound vac Restrictions Weight Bearing Restrictions: Yes RLE Weight Bearing: Weight bearing as tolerated    Mobility  Bed Mobility               General bed mobility comments: up in chair on arrival    Transfers Overall transfer level: Needs assistance   Transfers: Sit to/from Stand Sit to Stand: Supervision         General transfer comment: supervision for lines, pushed up from arm rests  Ambulation/Gait Ambulation/Gait assistance: Min guard Gait Distance (Feet): 60 Feet Assistive device: Rolling walker (2 wheeled) Gait Pattern/deviations: Step-through pattern;Decreased stride length;Narrow base of support Gait velocity: controlled Gait velocity interpretation: 1.31 - 2.62 ft/sec, indicative of limited community ambulator General Gait Details: pt began amb without RW however shorter step length, wide base of support, and guarded with flat foot stepping pattern vs heel/toe strike. when given RW pt with more fluid gait pattern,  increased step length and increased ankle DF with toe off phase, no LOB   Stairs             Wheelchair Mobility    Modified Rankin (Stroke Patients Only)       Balance Overall balance assessment: History of Falls;Needs assistance Sitting-balance support: Feet supported Sitting balance-Leahy Scale: Good     Standing balance support: Bilateral upper extremity supported;During functional activity Standing balance-Leahy Scale: Fair Standing balance comment: pt stood to perform hygiene s/p urination and washed hands without support at supervision level                            Cognition Arousal/Alertness: Awake/alert Behavior During Therapy: WFL for tasks assessed/performed Overall Cognitive Status: Within Functional Limits for tasks assessed                                 General Comments: motivated and pushing to do more      Exercises      General Comments General comments (skin integrity, edema, etc.): R LE wound vac intact      Pertinent Vitals/Pain Pain Assessment: No/denies pain    Home Living                      Prior Function            PT Goals (current goals can now be found in the care plan section) Acute  Rehab PT Goals Patient Stated Goal: to walk and go home PT Goal Formulation: With patient Time For Goal Achievement: 08/21/20 Potential to Achieve Goals: Good Progress towards PT goals: Progressing toward goals    Frequency    Min 3X/week      PT Plan Current plan remains appropriate    Co-evaluation              AM-PAC PT "6 Clicks" Mobility   Outcome Measure  Help needed turning from your back to your side while in a flat bed without using bedrails?: A Little Help needed moving from lying on your back to sitting on the side of a flat bed without using bedrails?: A Little Help needed moving to and from a bed to a chair (including a wheelchair)?: A Little Help needed standing up from a  chair using your arms (e.g., wheelchair or bedside chair)?: A Little Help needed to walk in hospital room?: A Little Help needed climbing 3-5 steps with a railing? : A Little 6 Click Score: 18    End of Session Equipment Utilized During Treatment: Gait belt Activity Tolerance: Patient tolerated treatment well;Treatment limited secondary to medical complications (Comment) Patient left: in chair;with call bell/phone within reach Nurse Communication: Mobility status PT Visit Diagnosis: Unsteadiness on feet (R26.81);Muscle weakness (generalized) (M62.81);Pain Pain - Right/Left: Right Pain - part of body: Leg     Time: [id number] PT Time Calculation (min) (ACUTE ONLY): 30 min  Charges:  $Gait Training: 23-37 mins                     Yolanda Peters, PT, DPT Acute Rehabilitation Services Pager #: [phone] Office #: [phone]    Berline Lopes 08/10/2020, 1:34 PM

## 2020-08-10 NOTE — Progress Notes (Signed)
Patient ID: Yolanda Peters, female   DOB: 1940-05-10, 80 y.o.   MRN: [id number] Patient has no complaints this morning she states she is able to walk the floor yesterday for 2 laps.  There is about 250 cc of clear serosanguineous drainage in the wound VAC canister.  Depending on operating room availability we will plan for split-thickness skin graft either tomorrow or Wednesday.

## 2020-08-10 NOTE — Anesthesia Preprocedure Evaluation (Addendum)
Anesthesia Evaluation  Patient identified by MRN, date of birth, ID band Patient awake    Reviewed: Allergy & Precautions, NPO status , Patient's Chart, lab work & pertinent test results  Airway Mallampati: III  TM Distance: >3 FB Neck ROM: Full    Dental  (+) Dental Advisory Given, Partial Upper   Pulmonary neg pulmonary ROS,    Pulmonary exam normal breath sounds clear to auscultation       Cardiovascular hypertension, Pt. on medications + Peripheral Vascular Disease  Normal cardiovascular exam Rhythm:Regular Rate:Normal     Neuro/Psych negative neurological ROS  negative psych ROS   GI/Hepatic negative GI ROS, Neg liver ROS,   Endo/Other  negative endocrine ROS  Renal/GU negative Renal ROS     Musculoskeletal  (+) Arthritis , Right leg wound   Abdominal   Peds  Hematology negative hematology ROS (+)   Anesthesia Other Findings H/o right breast cancer   Reproductive/Obstetrics                            Anesthesia Physical Anesthesia Plan  ASA: 2  Anesthesia Plan: General   Post-op Pain Management:    Induction: Intravenous  PONV Risk Score and Plan: 3 and Treatment may vary due to age or medical condition, Dexamethasone and Ondansetron  Airway Management Planned: LMA  Additional Equipment:   Intra-op Plan:   Post-operative Plan: Extubation in OR  Informed Consent: I have reviewed the patients History and Physical, chart, labs and discussed the procedure including the risks, benefits and alternatives for the proposed anesthesia with the patient or authorized representative who has indicated his/her understanding and acceptance.     Dental advisory given  Plan Discussed with: CRNA  Anesthesia Plan Comments:        Anesthesia Quick Evaluation

## 2020-08-11 ENCOUNTER — Inpatient Hospital Stay (HOSPITAL_COMMUNITY): Payer: Medicare Other | Admitting: Certified Registered"

## 2020-08-11 ENCOUNTER — Encounter (HOSPITAL_COMMUNITY)
Admission: EM | Disposition: A | Discharge status: Home Health | Payer: Self-pay | Source: Home / Self Care | Attending: Internal Medicine

## 2020-08-11 DIAGNOSIS — L97913 Non-pressure chronic ulcer of unspecified part of right lower leg with necrosis of muscle: Secondary | ICD-10-CM | POA: Diagnosis not present

## 2020-08-11 DIAGNOSIS — L03115 Cellulitis of right lower limb: Principal | ICD-10-CM

## 2020-08-11 HISTORY — PX: I & D EXTREMITY: SHX5045

## 2020-08-11 LAB — AEROBIC/ANAEROBIC CULTURE W GRAM STAIN (SURGICAL/DEEP WOUND)

## 2020-08-11 SURGERY — IRRIGATION AND DEBRIDEMENT EXTREMITY
Anesthesia: General | Laterality: Right

## 2020-08-11 MED ORDER — PROPOFOL 10 MG/ML IV BOLUS
INTRAVENOUS | Status: DC | PRN
  Administered 2020-08-11: 140 mg via INTRAVENOUS

## 2020-08-11 MED ORDER — FENTANYL CITRATE (PF) 100 MCG/2ML IJ SOLN: 25.0000 ug | INTRAMUSCULAR | Status: DC | PRN

## 2020-08-11 MED ORDER — HYDROCODONE-ACETAMINOPHEN 5-325 MG PO TABS: 1.0000 | ORAL_TABLET | ORAL | 0 refills | Status: DC | PRN

## 2020-08-11 MED ORDER — LIDOCAINE 2% (20 MG/ML) 5 ML SYRINGE
INTRAMUSCULAR | Status: DC | PRN
  Administered 2020-08-11: 80 mg via INTRAVENOUS

## 2020-08-11 MED ORDER — ONDANSETRON HCL 4 MG/2ML IJ SOLN
INTRAMUSCULAR | Status: AC
  Filled 2020-08-11: qty 2

## 2020-08-11 MED ORDER — DOCUSATE SODIUM 100 MG PO CAPS: 100.0000 mg | ORAL_CAPSULE | Freq: Two times a day (BID) | ORAL | Status: DC

## 2020-08-11 MED ORDER — ACETAMINOPHEN 500 MG PO TABS
ORAL_TABLET | ORAL | Status: AC
  Filled 2020-08-11: qty 2

## 2020-08-11 MED ORDER — ONDANSETRON HCL 4 MG/2ML IJ SOLN
INTRAMUSCULAR | Status: DC | PRN
  Administered 2020-08-11: 4 mg via INTRAVENOUS

## 2020-08-11 MED ORDER — SUCCINYLCHOLINE CHLORIDE 200 MG/10ML IV SOSY
PREFILLED_SYRINGE | INTRAVENOUS | Status: AC
  Filled 2020-08-11: qty 10

## 2020-08-11 MED ORDER — ROCURONIUM BROMIDE 10 MG/ML (PF) SYRINGE
PREFILLED_SYRINGE | INTRAVENOUS | Status: AC
  Filled 2020-08-11: qty 10

## 2020-08-11 MED ORDER — LACTATED RINGERS IV SOLN: INTRAVENOUS | Status: DC | PRN

## 2020-08-11 MED ORDER — DEXAMETHASONE SODIUM PHOSPHATE 10 MG/ML IJ SOLN
INTRAMUSCULAR | Status: DC | PRN
  Administered 2020-08-11: 10 mg via INTRAVENOUS

## 2020-08-11 MED ORDER — 0.9 % SODIUM CHLORIDE (POUR BTL) OPTIME
TOPICAL | Status: DC | PRN
  Administered 2020-08-11: 1000 mL

## 2020-08-11 MED ORDER — LIDOCAINE 2% (20 MG/ML) 5 ML SYRINGE
INTRAMUSCULAR | Status: AC
  Filled 2020-08-11: qty 5

## 2020-08-11 MED ORDER — ONDANSETRON HCL 4 MG/2ML IJ SOLN: 4.0000 mg | Freq: Once | INTRAMUSCULAR | Status: DC | PRN

## 2020-08-11 MED ORDER — DEXAMETHASONE SODIUM PHOSPHATE 10 MG/ML IJ SOLN
INTRAMUSCULAR | Status: AC
  Filled 2020-08-11: qty 1

## 2020-08-11 MED ORDER — PHENYLEPHRINE 40 MCG/ML (10ML) SYRINGE FOR IV PUSH (FOR BLOOD PRESSURE SUPPORT)
PREFILLED_SYRINGE | INTRAVENOUS | Status: DC | PRN
  Administered 2020-08-11 (×2): 120 ug via INTRAVENOUS

## 2020-08-11 MED ORDER — MIDAZOLAM HCL 2 MG/2ML IJ SOLN
INTRAMUSCULAR | Status: AC
  Filled 2020-08-11: qty 2

## 2020-08-11 MED ORDER — ACETAMINOPHEN 325 MG PO TABS
ORAL_TABLET | ORAL | Status: DC | PRN
  Administered 2020-08-11: 1000 mg via ORAL

## 2020-08-11 MED ORDER — EPHEDRINE SULFATE-NACL 50-0.9 MG/10ML-% IV SOSY
PREFILLED_SYRINGE | INTRAVENOUS | Status: DC | PRN
Start: 2020-08-11 — End: 2020-08-11
  Administered 2020-08-11: 5 mg via INTRAVENOUS
  Administered 2020-08-11 (×2): 10 mg via INTRAVENOUS

## 2020-08-11 MED ORDER — FENTANYL CITRATE (PF) 250 MCG/5ML IJ SOLN
INTRAMUSCULAR | Status: DC | PRN
  Administered 2020-08-11 (×3): 50 ug via INTRAVENOUS

## 2020-08-11 MED ORDER — PROPOFOL 10 MG/ML IV BOLUS
INTRAVENOUS | Status: AC
  Filled 2020-08-11: qty 20

## 2020-08-11 MED ORDER — FENTANYL CITRATE (PF) 250 MCG/5ML IJ SOLN
INTRAMUSCULAR | Status: AC
  Filled 2020-08-11: qty 5

## 2020-08-11 SURGICAL SUPPLY — 39 items
BAG COUNTER SPONGE SURGICOUNT (BAG) ×1 IMPLANT
BAG SURGICOUNT SPONGE COUNTING (BAG) ×1
BLADE SURG 21 STRL SS (BLADE) ×3 IMPLANT
BNDG COHESIVE 6X5 TAN STRL LF (GAUZE/BANDAGES/DRESSINGS) ×4 IMPLANT
BNDG GAUZE ELAST 4 BULKY (GAUZE/BANDAGES/DRESSINGS) ×6 IMPLANT
COVER SURGICAL LIGHT HANDLE (MISCELLANEOUS) ×6 IMPLANT
DRAPE DERMATAC (DRAPES) ×4 IMPLANT
DRAPE U-SHAPE 47X51 STRL (DRAPES) ×3 IMPLANT
DRESSING PREVENA PLUS CUSTOM (GAUZE/BANDAGES/DRESSINGS) IMPLANT
DRSG ADAPTIC 3X8 NADH LF (GAUZE/BANDAGES/DRESSINGS) ×3 IMPLANT
DRSG PREVENA PLUS CUSTOM (GAUZE/BANDAGES/DRESSINGS) ×3
DRSG VERAFLO VAC MED (GAUZE/BANDAGES/DRESSINGS) ×2 IMPLANT
DURAPREP 26ML APPLICATOR (WOUND CARE) ×3 IMPLANT
ELECT REM PT RETURN 9FT ADLT (ELECTROSURGICAL) ×3
ELECTRODE REM PT RTRN 9FT ADLT (ELECTROSURGICAL) IMPLANT
GAUZE SPONGE 4X4 12PLY STRL (GAUZE/BANDAGES/DRESSINGS) ×3 IMPLANT
GLOVE SURG ORTHO LTX SZ9 (GLOVE) ×3 IMPLANT
GLOVE SURG UNDER POLY LF SZ9 (GLOVE) ×3 IMPLANT
GOWN STRL REUS W/ TWL XL LVL3 (GOWN DISPOSABLE) ×2 IMPLANT
GOWN STRL REUS W/TWL XL LVL3 (GOWN DISPOSABLE) ×6
GRAFT SKIN WND OMEGA3 3X7 (Tissue) ×1 IMPLANT
GRAFT SKIN WND OMEGA3 7X10 (Tissue) ×2 IMPLANT
GRAFT SKN 7X10XSTRL LF DISP (Tissue) IMPLANT
KIT BASIN OR (CUSTOM PROCEDURE TRAY) ×3 IMPLANT
KIT PUMP PREVENA PLUS 14DAY (MISCELLANEOUS) ×2 IMPLANT
KIT TURNOVER KIT B (KITS) ×3 IMPLANT
MANIFOLD NEPTUNE II (INSTRUMENTS) ×3 IMPLANT
NS IRRIG 1000ML POUR BTL (IV SOLUTION) ×3 IMPLANT
PACK ORTHO EXTREMITY (CUSTOM PROCEDURE TRAY) ×3 IMPLANT
PAD ARMBOARD 7.5X6 YLW CONV (MISCELLANEOUS) ×6 IMPLANT
SKIN WOUND KERECIS OMEGA3 3X7 (Tissue) ×1 IMPLANT
SKIN WOUND KERECIS OMEGA3 7X10 (Tissue) ×1 IMPLANT
STOCKINETTE IMPERVIOUS 9X36 MD (GAUZE/BANDAGES/DRESSINGS) ×2 IMPLANT
SUT ETHILON 2 0 PSLX (SUTURE) ×3 IMPLANT
SWAB COLLECTION DEVICE MRSA (MISCELLANEOUS) ×3 IMPLANT
TOWEL GREEN STERILE (TOWEL DISPOSABLE) ×3 IMPLANT
TUBE CONNECTING 12'X1/4 (SUCTIONS) ×1
TUBE CONNECTING 12X1/4 (SUCTIONS) ×2 IMPLANT
YANKAUER SUCT BULB TIP NO VENT (SUCTIONS) ×3 IMPLANT

## 2020-08-11 NOTE — Interval H&P Note (Signed)
History and Physical Interval Note:  08/11/2020 6:45 AM  Yolanda Peters  has presented today for surgery, with the diagnosis of wound leg.  The various methods of treatment have been discussed with the patient and family. After consideration of risks, benefits and other options for treatment, the patient has consented to  Procedure(s): IRRIGATION AND DEBRIDEMENT EXTREMITY SKIN GRAFT (Right) as a surgical intervention.  The patient's history has been reviewed, patient examined, no change in status, stable for surgery.  I have reviewed the patient's chart and labs.  Questions were answered to the patient's satisfaction.     [name]

## 2020-08-11 NOTE — Care Management (Signed)
Patient discharged before NCM could see patient. PT recommendation for HHPT and walker. Orders for [facility] aide. Messaged attending , cannot have [facility] aide by self, asked if he would like to add HHPT. Await response.

## 2020-08-11 NOTE — Progress Notes (Signed)
Andree Moro Amy to be D/C'd  per MD order.  Discussed with the patient and all questions fully answered.  VSS, Skin clean, dry and intact without evidence of skin break down, no evidence of skin tears noted.  IV catheter discontinued intact. Site without signs and symptoms of complications. Dressing and pressure applied.  An After Visit Summary was printed and given to the patient. Patient received prescription.   Patient instructed to return to ED, call 911, or call MD for any changes in condition.   Patient to be escorted via [place], and D/C home via private auto.

## 2020-08-11 NOTE — Plan of Care (Signed)
  Problem: Education: Goal: Knowledge of General Education information will improve Description: Including pain rating scale, medication(s)/side effects and non-pharmacologic comfort measures Outcome: Progressing   Problem: Health Behavior/Discharge Planning: Goal: Ability to manage health-related needs will improve Outcome: Progressing   Problem: Clinical Measurements: Goal: Ability to maintain clinical measurements within normal limits will improve Outcome: Progressing Goal: Will remain free from infection Outcome: Progressing Goal: Diagnostic test results will improve Outcome: Progressing Goal: Respiratory complications will improve Outcome: Progressing Goal: Cardiovascular complication will be avoided Outcome: Progressing   Problem: Clinical Measurements: Goal: Will remain free from infection Outcome: Progressing   Problem: Elimination: Goal: Will not experience complications related to bowel motility Outcome: Progressing Goal: Will not experience complications related to urinary retention Outcome: Progressing   Problem: Pain Managment: Goal: General experience of comfort will improve Outcome: Progressing

## 2020-08-11 NOTE — Anesthesia Procedure Notes (Signed)
Procedure Name: LMA Insertion Date/Time: 08/11/2020 7:25 AM Performed by: Lance Coon, CRNA Pre-anesthesia Checklist: Patient identified, Emergency Drugs available, Suction available, Patient being monitored and Timeout performed Patient Re-evaluated:Patient Re-evaluated prior to induction Oxygen Delivery Method: Circle system utilized Preoxygenation: Pre-oxygenation with 100% oxygen Induction Type: IV induction LMA: LMA inserted LMA Size: 4.0 Number of attempts: 1 Placement Confirmation: positive ETCO2 and breath sounds checked- equal and bilateral Tube secured with: Tape Dental Injury: Teeth and Oropharynx as per pre-operative assessment

## 2020-08-11 NOTE — Anesthesia Postprocedure Evaluation (Signed)
Anesthesia Post Note  Patient: Melinda Dube Kishi  Procedure(s) Performed: IRRIGATION AND DEBRIDEMENT EXTREMITY SKIN GRAFT (Right)     Patient location during evaluation: PACU Anesthesia Type: General Level of consciousness: awake and alert Pain management: pain level controlled Vital Signs Assessment: post-procedure vital signs reviewed and stable Respiratory status: spontaneous breathing, nonlabored ventilation and respiratory function stable Cardiovascular status: blood pressure returned to baseline and stable Postop Assessment: no apparent nausea or vomiting Anesthetic complications: no   No notable events documented.  Last Vitals:  Vitals:   08/11/20 0835 08/11/20 0854  BP: 127/73 132/81  Pulse: 88 85  Resp: 17 17  Temp: 36.6 C 36.6 C  SpO2: 94% 94%    Last Pain:  Vitals:   08/11/20 0854  TempSrc: Oral  PainSc:                  Catalina Gravel

## 2020-08-11 NOTE — Transfer of Care (Signed)
Immediate Anesthesia Transfer of Care Note  Patient: Yolanda Peters  Procedure(s) Performed: IRRIGATION AND DEBRIDEMENT EXTREMITY SKIN GRAFT (Right)  Patient Location: PACU  Anesthesia Type:General  Level of Consciousness: drowsy and patient cooperative  Airway & Oxygen Therapy: Patient Spontanous Breathing  Post-op Assessment: Report given to RN and Post -op Vital signs reviewed and stable  Post vital signs: Reviewed and stable  Last Vitals:  Vitals Value Taken Time  BP 127/76 08/11/20 0757  Temp    Pulse 96 08/11/20 0757  Resp 16 08/11/20 0757  SpO2 97 % 08/11/20 0757  Vitals shown include unvalidated device data.  Last Pain:  Vitals:   08/11/20 0530  TempSrc: Oral  PainSc:       Patients Stated Pain Goal: 0 ([id number])  Complications: No notable events documented.

## 2020-08-11 NOTE — Discharge Summary (Signed)
Physician Discharge Summary  Yolanda Peters [id number] DOB: 1940/02/27 DOA: 08/04/2020  PCP: Shon Baton, MD  Admit date: 08/04/2020 Discharge date: 08/11/2020  Admitted From: Home Disposition:  Home  Recommendations for Outpatient Follow-up:  Follow up with Orthopedics in 1-2 weeks Please obtain BMP/CBC in one week   Home Health:yes Equipment/Devices:Wound vac  Discharge Condition:Stable CODE STATUS:Full Diet recommendation: Heart Healthy  Brief/Interim Summary: 80 y.o. female past medical history significant for essential hypertension rheumatoid arthritis breast cancer in remission sent for laceration after a fall on 07/26/2020 since then has been coming crease erythematous and with purulent drainage  Discharge Diagnoses:  Principal Problem:   Cellulitis of right leg Active Problems:   RA (rheumatoid arthritis) ([place])   HTN (hypertension)   Ulcer of right leg, with necrosis of muscle ([facility])  Cellulitis of the right leg/ulcer of the right leg with necrosis of the muscle, She was started on IV Rocephin and vancomycin. Orthopedic surgery was consulted to perform I&D on 08/06/2020 with wound VAC was placed, she was reintervene on 8 03/01/2020 with a graft.  She will go home on wound VAC antibiotics were discontinued she was given narcotic for pain control.  Rheumatoid arthritis: Noted.  Essential hypertension well No change made to his medication  Discharge Instructions  Discharge Instructions     Diet - low sodium heart healthy   Complete by: As directed    Increase activity slowly   Complete by: As directed    Negative Pressure Wound Therapy - Incisional   Complete by: As directed    Show patient how to attach vac   No wound care   Complete by: As directed       Allergies as of 08/11/2020       Reactions   Penicillins Nausea Only, Swelling   Fever Did it involve swelling of the face/tongue/throat, SOB, or low BP? Unknown Did it involve sudden or severe  rash/hives, skin peeling, or any reaction on the inside of your mouth or nose? No Did you need to seek medical attention at a hospital or doctor's office? Yes When did it last happen?      60+ years ago If all above answers are "NO", may proceed with cephalosporin use.   Tetracyclines & Related Nausea Only        Medication List     STOP taking these medications    APPLE CIDER VINEGAR PO       TAKE these medications    beta carotene 10000 UNIT capsule Take 25,000 Units by mouth daily.   CALCIUM & MAGNESIUM CARBONATES PO Take 2 tablets by mouth daily.   GINGER ROOT PO Take 200 mg by mouth 3 (three) times a week.   HYDROcodone-acetaminophen 5-325 MG tablet Commonly known as: NORCO/VICODIN Take 1-2 tablets by mouth every 4 (four) hours as needed for moderate pain (pain score 4-6).   loratadine 10 MG tablet Commonly known as: CLARITIN Take 10 mg by mouth daily.   neomycin-bacitracin-polymyxin ointment Commonly known as: NEOSPORIN Apply 1 application topically as needed for wound care.   PROBIOTIC DAILY PO Take 1 capsule by mouth daily.   trimethoprim 100 MG tablet Commonly known as: TRIMPEX Take 100 mg by mouth daily.   verapamil 360 MG 24 hr capsule Commonly known as: VERELAN PM Take 360 mg by mouth daily.   vitamin C 1000 MG tablet Take 1,000 mg by mouth daily.   VITAMIN D-3 PO Take 2,000 Units by mouth daily.   vitamin E 1000  UNIT capsule Take 1,000 Units by mouth daily.   ZINC PO Take 50 mg by mouth daily.        Follow-up Information     [name], MD Follow up in 1 week(s).   Specialty: Orthopedic Surgery Contact information: [address] [place] [address] [phone]                Allergies  Allergen Reactions   Penicillins Nausea Only and Swelling    Fever Did it involve swelling of the face/tongue/throat, SOB, or low BP? Unknown Did it involve sudden or severe rash/hives, skin peeling, or any reaction on the  inside of your mouth or nose? No Did you need to seek medical attention at a hospital or doctor's office? Yes When did it last happen?      60+ years ago If all above answers are "NO", may proceed with cephalosporin use.    Tetracyclines & Related Nausea Only    Consultations: Orthopedic surgery   Procedures/Studies: DG Tibia/Fibula Right  Result Date: 08/04/2020 CLINICAL DATA:  Initial evaluation for recent fall with recent laceration to right leg, now with infection. EXAM: RIGHT TIBIA AND FIBULA - 2 VIEW COMPARISON:  None. FINDINGS: No acute fracture or dislocation. 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 No discrete osseous lesions. Osteopenia noted. Focal soft tissue swelling noted at the lateral aspect of the right lower leg, nonspecific, but could reflect acute soft tissue injury and/or infection. No radiopaque foreign body. No dissecting soft tissue emphysema. No other visible soft tissue abnormality. Few scattered vascular calcifications noted. IMPRESSION: 1. Focal soft tissue swelling at the lateral aspect of the right lower leg, nonspecific, but could reflect focal soft tissue injury and/or infection. No radiopaque foreign body or dissecting soft tissue emphysema. 2. No acute osseous abnormality. 3. 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 Electronically Signed   By: Jeannine Boga M.D.   On: 08/04/2020 23:26   DG Chest Port 1 View  Result Date: 08/04/2020 CLINICAL DATA:  Initial evaluation for questionable sepsis. EXAM: PORTABLE CHEST 1 VIEW COMPARISON:  Prior radiograph from 08/13/2009. FINDINGS: Mild cardiomegaly. Mediastinal silhouette within normal limits. Aortic atherosclerosis. Lungs normally inflated. Mild diffuse interstitial congestion without frank alveolar edema. No pleural effusion. Hazy density at the peripheral left lung base favored to reflect atelectasis and/or congestion. Possible infiltrate difficult to  exclude, and could be considered in the correct clinical setting. No other focal airspace disease. No pneumothorax. No acute osseous finding. Osteopenia. Degenerative changes noted about the visualized shoulders. IMPRESSION: 1. Hazy density at the peripheral left lung base, favored to reflect atelectasis and/or congestion. Possible infiltrate difficult to exclude, and could be considered in the correct clinical setting. 2. Cardiomegaly with underlying mild diffuse pulmonary interstitial congestion. 3.  Aortic Atherosclerosis (ICD10-I70.0). Electronically Signed   By: Jeannine Boga M.D.   On: 08/04/2020 23:28   (Echo, Carotid, EGD, Colonoscopy, ERCP)    Subjective: No complaints feels great Discharge Exam: Vitals:   08/11/20 0835 08/11/20 0854  BP: 127/73 132/81  Pulse: 88 85  Resp: 17 17  Temp: 97.8 F (36.6 C) 97.9 F (36.6 C)  SpO2: 94% 94%   Vitals:   08/11/20 0815 08/11/20 0830 08/11/20 0835 08/11/20 0854  BP: 121/76 124/71 127/73 132/81  Pulse: 89 89 88 85  Resp: '17 15 17 17  '$ Temp:   97.8 F (36.6 C) 97.9 F (36.6 C)  TempSrc:    Oral  SpO2: 93% 93% 94%  94%  Weight:      Height:        General: Pt is alert, awake, not in acute distress Cardiovascular: RRR, S1/S2 +, no rubs, no gallops Respiratory: CTA bilaterally, no wheezing, no rhonchi Abdominal: Soft, NT, ND, bowel sounds + Extremities: no edema, no cyanosis    The results of significant diagnostics from this hospitalization (including imaging, microbiology, ancillary and laboratory) are listed below for reference.     Microbiology: Recent Results (from the past 240 hour(s))  Blood Culture (routine x 2)     Status: None   Collection Time: 08/05/20 12:06 AM   Specimen: BLOOD  Result Value Ref Range Status   Specimen Description   Final    BLOOD LEFT ANTECUBITAL Performed at [facility] [address], [place], [place] [address]    Special Requests   Final    BOTTLES DRAWN AEROBIC  AND ANAEROBIC Blood Culture results may not be optimal due to an inadequate volume of blood received in culture bottles Performed at [facility] [address], [place], [place] [address]    Culture   Final    NO GROWTH 5 DAYS Performed at [facility], [place] [address], [place], [place] [address]    Report Status 08/10/2020 FINAL  Final  Blood Culture (routine x 2)     Status: None   Collection Time: 08/05/20 12:06 AM   Specimen: BLOOD  Result Value Ref Range Status   Specimen Description   Final    BLOOD RIGHT ANTECUBITAL Performed at [facility] [address] [place], [place] [address]    Special Requests   Final    BOTTLES DRAWN AEROBIC AND ANAEROBIC Blood Culture results may not be optimal due to an excessive volume of blood received in culture bottles Performed at [facility] [address], [place], [place] [address]    Culture   Final    NO GROWTH 5 DAYS Performed at [facility], [place] [address] [place], [place] [address]    Report Status 08/10/2020 FINAL  Final  SARS CORONAVIRUS 2 (TAT 6-24 HRS) Nasopharyngeal Nasopharyngeal Swab     Status: None   Collection Time: 08/05/20  1:31 AM   Specimen: Nasopharyngeal Swab  Result Value Ref Range Status   SARS Coronavirus 2 NEGATIVE NEGATIVE Final    Comment: (NOTE) SARS-CoV-2 target nucleic acids are NOT DETECTED.  The SARS-CoV-2 RNA is generally detectable in upper and lower respiratory specimens during the acute phase of infection. Negative results do not preclude SARS-CoV-2 infection, do not rule out co-infections with other pathogens, and should not be used as the sole basis for treatment or other patient management decisions. Negative results must be combined with clinical observations, patient history, and epidemiological information. The expected result is Negative.  Fact Sheet for Patients: SugarRoll.be  Fact  Sheet for Healthcare Providers: https://www.woods-mathews.com/  This test is not yet approved or cleared by the [place] FDA and  has been authorized for detection and/or diagnosis of SARS-CoV-2 by FDA under an Emergency Use Authorization (EUA). This EUA will remain  in effect (meaning this test can be used) for the duration of the COVID-19 declaration under Se ction 564(b)(1) of the Act, 21 U.S.C. section 360bbb-3(b)(1), unless the authorization is terminated or revoked sooner.  Performed at [facility], [place] [address] [place], [place] [address]   MRSA Next Gen by PCR, Nasal     Status: None  Collection Time: 08/05/20  9:07 AM   Specimen: Nasal Mucosa; Nasal Swab  Result Value Ref Range Status   MRSA by PCR Next Gen NOT DETECTED NOT DETECTED Final    Comment: (NOTE) The GeneXpert MRSA Assay (FDA approved for NASAL specimens only), is one component of a comprehensive MRSA colonization surveillance program. It is not intended to diagnose MRSA infection nor to guide or monitor treatment for MRSA infections. Test performance is not FDA approved in patients less than 78 years old. Performed at [facility], [place] [address], [place], [place] [address]   Surgical pcr screen     Status: None   Collection Time: 08/05/20  9:07 AM   Specimen: Nasal Mucosa; Nasal Swab  Result Value Ref Range Status   MRSA, PCR NEGATIVE NEGATIVE Final   Staphylococcus aureus NEGATIVE NEGATIVE Final    Comment: (NOTE) The Xpert SA Assay (FDA approved for NASAL specimens in patients 76 years of age and older), is one component of a comprehensive surveillance program. It is not intended to diagnose infection nor to guide or monitor treatment. Performed at [facility], [place] [address] [place], [place] [address]   Aerobic/Anaerobic Culture w Gram Stain (surgical/deep wound)     Status: None (Preliminary result)   Collection Time: 08/06/20  5:53  PM   Specimen: Soft Tissue, Other  Result Value Ref Range Status   Specimen Description TISSUE RIGHT LEG  Final   Special Requests NONE  Final   Gram Stain   Final    MODERATE WBC PRESENT, PREDOMINANTLY PMN NO ORGANISMS SEEN Performed at [facility], [place] [address], [place], [place] [address]    Culture   Final    RARE STREPTOCOCCUS AGALACTIAE TESTING AGAINST S. AGALACTIAE NOT ROUTINELY PERFORMED DUE TO PREDICTABILITY OF AMP/PEN/VAN SUSCEPTIBILITY. NO ANAEROBES ISOLATED; CULTURE IN PROGRESS FOR 5 DAYS    Report Status PENDING  Incomplete  Surgical pcr screen     Status: None   Collection Time: 08/10/20  5:02 PM   Specimen: Nasal Mucosa; Nasal Swab  Result Value Ref Range Status   MRSA, PCR NEGATIVE NEGATIVE Final   Staphylococcus aureus NEGATIVE NEGATIVE Final    Comment: (NOTE) The Xpert SA Assay (FDA approved for NASAL specimens in patients 44 years of age and older), is one component of a comprehensive surveillance program. It is not intended to diagnose infection nor to guide or monitor treatment. Performed at [facility], [place] [address], [place], [place] [address]      Labs: BNP (last 3 results) Recent Labs    08/05/20 0004  BNP [id number]   Basic Metabolic Panel: Recent Labs  Lab 08/05/20 0004 08/05/20 0703 08/08/20 0238  NA 142 141 137  K 4.1 3.6 3.5  CL 105 102 104  CO2 '26 26 23  '$ GLUCOSE 108* 94 124*  BUN 33* 22 21  CREATININE 1.00 0.60 0.71  CALCIUM 9.3 8.9 8.3*   Liver Function Tests: Recent Labs  Lab 08/05/20 0004  AST 17  ALT 16  ALKPHOS 87  BILITOT 1.0  PROT 7.6  ALBUMIN 4.0   No results for input(s): LIPASE, AMYLASE in the last 168 hours. No results for input(s): AMMONIA in the last 168 hours. CBC: Recent Labs  Lab 08/05/20 0004 08/05/20 0703  WBC 8.7 7.0  NEUTROABS 5.6  --   HGB 12.5 12.2  HCT 39.7 38.0  MCV 90.6 89.6  PLT 352 298   Cardiac Enzymes: No results for input(s):  CKTOTAL, CKMB, CKMBINDEX, TROPONINI in  the last 168 hours. BNP: Invalid input(s): POCBNP CBG: No results for input(s): GLUCAP in the last 168 hours. D-Dimer No results for input(s): DDIMER in the last 72 hours. Hgb A1c No results for input(s): HGBA1C in the last 72 hours. Lipid Profile No results for input(s): CHOL, HDL, LDLCALC, TRIG, CHOLHDL, LDLDIRECT in the last 72 hours. Thyroid function studies No results for input(s): TSH, T4TOTAL, T3FREE, THYROIDAB in the last 72 hours.  Invalid input(s): FREET3 Anemia work up No results for input(s): VITAMINB12, FOLATE, FERRITIN, TIBC, IRON, RETICCTPCT in the last 72 hours. Urinalysis    Component Value Date/Time   COLORURINE YELLOW 08/05/2020 0806   APPEARANCEUR CLEAR 08/05/2020 0806   LABSPEC 1.013 08/05/2020 0806   PHURINE 6.0 08/05/2020 0806   GLUCOSEU NEGATIVE 08/05/2020 0806   HGBUR NEGATIVE 08/05/2020 0806   BILIRUBINUR NEGATIVE 08/05/2020 0806   KETONESUR NEGATIVE 08/05/2020 0806   PROTEINUR NEGATIVE 08/05/2020 0806   NITRITE POSITIVE (A) 08/05/2020 0806   LEUKOCYTESUR NEGATIVE 08/05/2020 0806   Sepsis Labs Invalid input(s): PROCALCITONIN,  WBC,  LACTICIDVEN Microbiology Recent Results (from the past 240 hour(s))  Blood Culture (routine x 2)     Status: None   Collection Time: 08/05/20 12:06 AM   Specimen: BLOOD  Result Value Ref Range Status   Specimen Description   Final    BLOOD LEFT ANTECUBITAL Performed at [facility], [place] [address], [place], [place] [address]    Special Requests   Final    BOTTLES DRAWN AEROBIC AND ANAEROBIC Blood Culture results may not be optimal due to an inadequate volume of blood received in culture bottles Performed at [facility] [address] [place], [place] [address]    Culture   Final    NO GROWTH 5 DAYS Performed at [facility], [place] [address], [place], [place] [address]    Report Status 08/10/2020 FINAL  Final  Blood Culture (routine x 2)     Status: None    Collection Time: 08/05/20 12:06 AM   Specimen: BLOOD  Result Value Ref Range Status   Specimen Description   Final    BLOOD RIGHT ANTECUBITAL Performed at [facility] [address] [place], [place] [address]    Special Requests   Final    BOTTLES DRAWN AEROBIC AND ANAEROBIC Blood Culture results may not be optimal due to an excessive volume of blood received in culture bottles Performed at [facility] [address] [place], [place] [address]    Culture   Final    NO GROWTH 5 DAYS Performed at [facility], [place] [address], [place], [place] [address]    Report Status 08/10/2020 FINAL  Final  SARS CORONAVIRUS 2 (TAT 6-24 HRS) Nasopharyngeal Nasopharyngeal Swab     Status: None   Collection Time: 08/05/20  1:31 AM   Specimen: Nasopharyngeal Swab  Result Value Ref Range Status   SARS Coronavirus 2 NEGATIVE NEGATIVE Final    Comment: (NOTE) SARS-CoV-2 target nucleic acids are NOT DETECTED.  The SARS-CoV-2 RNA is generally detectable in upper and lower respiratory specimens during the acute phase of infection. Negative results do not preclude SARS-CoV-2 infection, do not rule out co-infections with other pathogens, and should not be used as the sole basis for treatment or other patient management decisions. Negative results must be combined with clinical observations, patient history, and epidemiological information. The expected result is Negative.  Fact Sheet for  Patients: SugarRoll.be  Fact Sheet for Healthcare Providers: https://www.woods-mathews.com/  This test is not yet approved or cleared by the [place] FDA and  has been authorized for detection and/or diagnosis of SARS-CoV-2 by FDA under an Emergency Use Authorization (EUA). This EUA will remain  in effect (meaning this test can be used) for the duration of the COVID-19 declaration under Se ction 564(b)(1) of the Act, 21  U.S.C. section 360bbb-3(b)(1), unless the authorization is terminated or revoked sooner.  Performed at [facility], [place] [address], [place], [place] [address]   MRSA Next Gen by PCR, Nasal     Status: None   Collection Time: 08/05/20  9:07 AM   Specimen: Nasal Mucosa; Nasal Swab  Result Value Ref Range Status   MRSA by PCR Next Gen NOT DETECTED NOT DETECTED Final    Comment: (NOTE) The GeneXpert MRSA Assay (FDA approved for NASAL specimens only), is one component of a comprehensive MRSA colonization surveillance program. It is not intended to diagnose MRSA infection nor to guide or monitor treatment for MRSA infections. Test performance is not FDA approved in patients less than 93 years old. Performed at [facility], [place] [address], [place], [place] [address]   Surgical pcr screen     Status: None   Collection Time: 08/05/20  9:07 AM   Specimen: Nasal Mucosa; Nasal Swab  Result Value Ref Range Status   MRSA, PCR NEGATIVE NEGATIVE Final   Staphylococcus aureus NEGATIVE NEGATIVE Final    Comment: (NOTE) The Xpert SA Assay (FDA approved for NASAL specimens in patients 20 years of age and older), is one component of a comprehensive surveillance program. It is not intended to diagnose infection nor to guide or monitor treatment. Performed at [facility], [place] [address], [place], [place] [address]   Aerobic/Anaerobic Culture w Gram Stain (surgical/deep wound)     Status: None (Preliminary result)   Collection Time: 08/06/20  5:53 PM   Specimen: Soft Tissue, Other  Result Value Ref Range Status   Specimen Description TISSUE RIGHT LEG  Final   Special Requests NONE  Final   Gram Stain   Final    MODERATE WBC PRESENT, PREDOMINANTLY PMN NO ORGANISMS SEEN Performed at [facility], [place] [address], [place], [place] [address]    Culture   Final    RARE STREPTOCOCCUS AGALACTIAE TESTING AGAINST S. AGALACTIAE NOT ROUTINELY PERFORMED  DUE TO PREDICTABILITY OF AMP/PEN/VAN SUSCEPTIBILITY. NO ANAEROBES ISOLATED; CULTURE IN PROGRESS FOR 5 DAYS    Report Status PENDING  Incomplete  Surgical pcr screen     Status: None   Collection Time: 08/10/20  5:02 PM   Specimen: Nasal Mucosa; Nasal Swab  Result Value Ref Range Status   MRSA, PCR NEGATIVE NEGATIVE Final   Staphylococcus aureus NEGATIVE NEGATIVE Final    Comment: (NOTE) The Xpert SA Assay (FDA approved for NASAL specimens in patients 59 years of age and older), is one component of a comprehensive surveillance program. It is not intended to diagnose infection nor to guide or monitor treatment. Performed at [facility], [place] [address], [place], [place] [address]      Time coordinating discharge: Over 30 minutes  SIGNED:   Charlynne Cousins, MD  [facility] 08/11/2020, 10:30 AM Pager   If 7PM-7AM, please contact night-coverage [email or web address] Password [email or web address]

## 2020-08-11 NOTE — Op Note (Addendum)
08/04/2020 - 08/11/2020  7:54 AM  PATIENT:  Yolanda Peters    PRE-OPERATIVE DIAGNOSIS:  wound leg  POST-OPERATIVE DIAGNOSIS:  Same  PROCEDURE:  IRRIGATION AND DEBRIDEMENT EXTREMITY  SKIN GRAFT with Kerecis skin graft 7 x 10 cm sheet and matrix powder.  Application of cleanse choice wound VAC sponge.  SURGEON:  [name], MD  PHYSICIAN ASSISTANT:None ANESTHESIA:   General  PREOPERATIVE INDICATIONS:  Yolanda Peters is a  80 y.o. female with a diagnosis of wound leg who failed conservative measures and elected for surgical management.    The risks benefits and alternatives were discussed with the patient preoperatively including but not limited to the risks of infection, bleeding, nerve injury, cardiopulmonary complications, the need for revision surgery, among others, and the patient was willing to proceed.  OPERATIVE IMPLANTS: Kerecis 7 x 10 cm sheet and powder.  Cleanse choice wound VAC sponge x1  '@ENCIMAGES'$ @  OPERATIVE FINDINGS: Healthy granulation tissue the wound had filled up with granulation tissue.  OPERATIVE PROCEDURE: Patient was brought the operating room and underwent a general anesthetic.  After adequate levels anesthesia were obtained patient's right lower extremity was prepped using Betadine paint draped into a sterile field a timeout was called.  A 21 blade knife rondure were used to debride the wound back to healthy viable bed.  The Kerecis powder was applied to the base of both wounds and the Kerecis sheet was then applied 7 x 10 cm to cover both wounds.  This was secured with staples the blue cleanse choice wound VAC sponge was applied this was covered with derma tack this had a good suction fit this was then overwrapped with Coban patient was extubated taken the PACU in stable condition.  Debridement type: Excisional Debridement  Side: right  Body Location: leg   Tools used for debridement: scalpel  Pre-debridement Wound size (cm):   Length: 10         Width: 7     Depth: 1   Post-debridement Wound size (cm):   Length: 10        Width: 7     Depth: 1   Debridement depth beyond dead/damaged tissue down to healthy viable tissue: yes  Tissue layer involved: skin, subcutaneous tissue, muscle / fascia  Nature of tissue removed: Slough, Nutritional therapist, and Non-viable tissue  Irrigation volume: 1 liter     Irrigation fluid type: Normal Saline     DISCHARGE PLANNING:  Antibiotic duration: Discontinue IV antibiotics  Weightbearing: Weightbearing as tolerated  Pain medication: Patient requests no pain medicine she states that Tylenol works fine  Dressing care/ Wound VAC: Continue wound VAC for 1 week  Ambulatory devices: Walker or cane  Discharge to: Patient may discharge to home today  Follow-up: In the office 1 week post operative.

## 2020-08-12 ENCOUNTER — Encounter (HOSPITAL_COMMUNITY): Payer: Self-pay | Admitting: Orthopedic Surgery

## 2020-08-18 ENCOUNTER — Encounter: Payer: Self-pay | Admitting: Physician Assistant

## 2020-08-18 ENCOUNTER — Ambulatory Visit (INDEPENDENT_AMBULATORY_CARE_PROVIDER_SITE_OTHER): Payer: Medicare Other | Admitting: Physician Assistant

## 2020-08-18 ENCOUNTER — Encounter: Payer: Medicare Other | Admitting: Family

## 2020-08-18 DIAGNOSIS — I70234 Atherosclerosis of native arteries of right leg with ulceration of heel and midfoot: Secondary | ICD-10-CM

## 2020-08-18 NOTE — Progress Notes (Signed)
Office Visit Note   Patient: Yolanda Peters           Date of Birth: 10/19/1940           MRN: [id number] Visit Date: 08/18/2020              Requested by: Shon Baton, [place],  [place] [address] PCP: Shon Baton, MD  Chief Complaint  Patient presents with   Right Leg - Follow-up      HPI: Patient is 1 week status post skin grafting of her leg wound.  She is doing very well and has had a wound VAC in place.  Assessment & Plan: Visit Diagnoses: No diagnosis found.  Plan: Patient will do daily dressing changes she may lightly rinse with antibacterial soap and water.  We will follow-up in 1 week.  I have also measured her for size medium compression socks which she will obtain and bring with her to her next visit  Follow-Up Instructions: No follow-ups on file.   Ortho Exam  Patient is alert, oriented, no adenopathy, well-dressed, normal affect, normal respiratory effort. Examination of the wounds good epithelialization around the edges.  Overall healthy wound bed mixed with good vascular tissue and fibrinous tissue.  No ascending cellulitis she does have moderate clear drainage at this time.  Imaging: No results found.   Labs: Lab Results  Component Value Date   ESRSEDRATE 2 01/22/2018   ESRSEDRATE 2 03/09/2017   REPTSTATUS 08/11/2020 FINAL 08/06/2020   GRAMSTAIN  08/06/2020    MODERATE WBC PRESENT, PREDOMINANTLY PMN NO ORGANISMS SEEN    CULT  08/06/2020    RARE STREPTOCOCCUS AGALACTIAE TESTING AGAINST S. AGALACTIAE NOT ROUTINELY PERFORMED DUE TO PREDICTABILITY OF AMP/PEN/VAN SUSCEPTIBILITY. NO ANAEROBES ISOLATED Performed at [facility], [place] [address] [place], [place] [address]      Lab Results  Component Value Date   ALBUMIN 4.0 08/05/2020   ALBUMIN 4.2 08/17/2016   ALBUMIN 4.2 01/13/2016    No results found for: MG Lab Results  Component Value Date   VD25OH 40 03/09/2017   VD25OH 71 12/30/2011   VD25OH 50 12/30/2010     No results found for: PREALBUMIN CBC EXTENDED Latest Ref Rng & Units 08/05/2020 08/05/2020 06/27/2018  WBC 4.0 - 10.5 K/uL 7.0 8.7 11.4(H)  RBC 3.87 - 5.11 MIL/uL 4.24 4.38 3.55(L)  HGB 12.0 - 15.0 g/dL 12.2 12.5 9.9(L)  HCT 36.0 - 46.0 % 38.0 39.7 31.8(L)  PLT 150 - 400 K/uL 298 352 233  NEUTROABS 1.7 - 7.7 K/uL - 5.6 -  LYMPHSABS 0.7 - 4.0 K/uL - 1.8 -     There is no height or weight on file to calculate BMI.  Orders:  No orders of the defined types were placed in this encounter.  No orders of the defined types were placed in this encounter.    Procedures: No procedures performed  Clinical Data: No additional findings.  ROS:  All other systems negative, except as noted in the HPI. Review of Systems  Objective: Vital Signs: There were no vitals taken for this visit.  Specialty Comments:  No specialty comments available.  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   RA (rheumatoid arthritis) ([place]) 02/24/2014   HTN (hypertension) 02/24/2014   Hyperlipidemia 02/24/2014   Breast cancer, right, upper outer quadrant  07/05/2010   Past Medical History:  Diagnosis Date   Allergy    Arthritis    Breast cancer ([place]) 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, skin cnacer   Cancer ([place])    right breast    Hyperlipidemia    no meds taken   Hypertension    Personal history of radiation therapy 2011   rt breast    Family History  Problem Relation Age of Onset   Cancer Mother        bladder   Cancer Father        leukemia   Cancer  Maternal Aunt        breast   Colon cancer Paternal Aunt        pt is unsure of this   Colon cancer Cousin        pt is unsure of this   Esophageal cancer Neg Hx    Rectal cancer Neg Hx    Stomach cancer Neg Hx     Past Surgical History:  Procedure Laterality Date   BREAST DUCTAL SYSTEM EXCISION     right breast   BREAST LUMPECTOMY     CATARACT EXTRACTION     bilateral   CESAREAN SECTION     COLONOSCOPY     ELBOW SURGERY     left   I & D EXTREMITY Right 08/06/2020   Procedure: IRRIGATION AND DEBRIDEMENT OF LEG, wound vac placement;  Surgeon: [name], MD;  Location: [facility];  Service: Orthopedics;  Laterality: Right;   I & D EXTREMITY Right 08/11/2020   Procedure: IRRIGATION AND DEBRIDEMENT EXTREMITY SKIN GRAFT;  Surgeon: [name], MD;  Location: [facility];  Service: Orthopedics;  Laterality: Right;   JOINT REPLACEMENT     KNEE ARTHROPLASTY Left    March 2021   MYOMECTOMY     REPLACEMENT TOTAL KNEE Left 03/2019   TOTAL KNEE ARTHROPLASTY Right 06/26/2018   Procedure: TOTAL KNEE ARTHROPLASTY;  Surgeon: Paralee Cancel, MD;  [facility];  Service: Orthopedics;  Laterality: Right;  70 mins   Social History   Occupational History   Not on file  Tobacco Use   Smoking status: Never   Smokeless tobacco: Never  Vaping Use   Vaping Use: Never used  Substance and Sexual Activity   Alcohol use: Yes    Comment: occ   Drug use: No   Sexual activity: Yes

## 2020-08-25 ENCOUNTER — Ambulatory Visit (INDEPENDENT_AMBULATORY_CARE_PROVIDER_SITE_OTHER): Payer: Medicare Other | Admitting: Orthopedic Surgery

## 2020-08-25 ENCOUNTER — Other Ambulatory Visit: Payer: Self-pay

## 2020-08-25 ENCOUNTER — Encounter: Payer: Self-pay | Admitting: Orthopedic Surgery

## 2020-08-25 DIAGNOSIS — L97911 Non-pressure chronic ulcer of unspecified part of right lower leg limited to breakdown of skin: Secondary | ICD-10-CM

## 2020-08-28 ENCOUNTER — Ambulatory Visit (INDEPENDENT_AMBULATORY_CARE_PROVIDER_SITE_OTHER): Payer: Medicare Other | Admitting: Physician Assistant

## 2020-08-28 ENCOUNTER — Encounter: Payer: Self-pay | Admitting: Physician Assistant

## 2020-08-28 ENCOUNTER — Other Ambulatory Visit: Payer: Self-pay

## 2020-08-28 DIAGNOSIS — I70234 Atherosclerosis of native arteries of right leg with ulceration of heel and midfoot: Secondary | ICD-10-CM

## 2020-08-28 NOTE — Progress Notes (Signed)
Office Visit Note   Patient: Yolanda Peters           Date of Birth: [birth year]           MRN: [id number] Visit Date: 08/28/2020              Requested by: Shon Baton, MD [address] [place],  [place] [address] PCP: Shon Baton, MD  Chief Complaint  Patient presents with   Right Leg - Routine Post Op    08/11/20 I&D RLE skin graft kerecis       HPI: Patient is a pleasant 80 year old woman who follows up today for her skin grafting to her right lower extremity.  She did have quite a bit of swelling earlier this week and was placed in Dynaflex wraps.  She states she did not tolerate the wraps well and pulled them up from her feet a bit.  She also could not find proper shoes to wear with him.  Assessment & Plan: Visit Diagnoses: No diagnosis found.  Plan: We will place the patient in her compression sock with a wrap of Ace wrap over the top.  She understands that she is to change this daily.  She will follow-up on Tuesday.  Follow-Up Instructions: No follow-ups on file.   Ortho Exam  Patient is alert, oriented, no adenopathy, well-dressed, normal affect, normal respiratory effort. Examination of the right lower extremity the more proximal wound has a mix of fibrinous and healthy granulation tissue.  There is no surrounding cellulitis no foul odor. The lower wound is almost completely of fibrinous tissue.  Did try to debride this a little bit with a small amount of bleeding resulting.  Patient had difficulty tolerating this.  She has much decreased swelling in her leg and significant wrinkling.  Imaging: No results found.    Labs: Lab Results  Component Value Date   ESRSEDRATE 2 01/22/2018   ESRSEDRATE 2 03/09/2017   REPTSTATUS 08/11/2020 FINAL 08/06/2020   GRAMSTAIN  08/06/2020    MODERATE WBC PRESENT, PREDOMINANTLY PMN NO ORGANISMS SEEN    CULT  08/06/2020    RARE STREPTOCOCCUS AGALACTIAE TESTING AGAINST S. AGALACTIAE NOT ROUTINELY PERFORMED DUE TO PREDICTABILITY  OF AMP/PEN/VAN SUSCEPTIBILITY. NO ANAEROBES ISOLATED Performed at [facility], [place] [address], [place], [place] [address]      Lab Results  Component Value Date   ALBUMIN 4.0 08/05/2020   ALBUMIN 4.2 08/17/2016   ALBUMIN 4.2 01/13/2016    No results found for: MG Lab Results  Component Value Date   VD25OH 40 03/09/2017   VD25OH 71 12/30/2011   VD25OH 50 12/30/2010    No results found for: PREALBUMIN CBC EXTENDED Latest Ref Rng & Units 08/05/2020 08/05/2020 06/27/2018  WBC 4.0 - 10.5 K/uL 7.0 8.7 11.4(H)  RBC 3.87 - 5.11 MIL/uL 4.24 4.38 3.55(L)  HGB 12.0 - 15.0 g/dL 12.2 12.5 9.9(L)  HCT 36.0 - 46.0 % 38.0 39.7 31.8(L)  PLT 150 - 400 K/uL 298 352 233  NEUTROABS 1.7 - 7.7 K/uL - 5.6 -  LYMPHSABS 0.7 - 4.0 K/uL - 1.8 -     There is no height or weight on file to calculate BMI.  Orders:  No orders of the defined types were placed in this encounter.  No orders of the defined types were placed in this encounter.    Procedures: No procedures performed  Clinical Data: No additional findings.  ROS:  All other systems negative, except as noted in the HPI. Review  of Systems  Objective: Vital Signs: There were no vitals taken for this visit.  Specialty Comments:  No specialty comments available.  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   RA (rheumatoid arthritis) ([place]) 02/24/2014   HTN (hypertension) 02/24/2014   Hyperlipidemia 02/24/2014   Breast cancer, right, upper outer quadrant  07/05/2010   Past  Medical History:  Diagnosis Date   Allergy    Arthritis    Breast cancer ([facility]) 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, skin cnacer   Cancer ([place])    right breast    Hyperlipidemia    no meds taken   Hypertension    Personal history of radiation therapy 2011   rt breast    Family History  Problem Relation Age of Onset   Cancer Mother        bladder   Cancer Father        leukemia   Cancer Maternal Aunt        breast   Colon cancer Paternal Aunt        pt is unsure of this   Colon cancer Cousin        pt is unsure of this   Esophageal cancer Neg Hx    Rectal cancer Neg Hx    Stomach cancer Neg Hx     Past Surgical History:  Procedure Laterality Date   BREAST DUCTAL SYSTEM EXCISION     right breast   BREAST LUMPECTOMY     CATARACT EXTRACTION     bilateral   CESAREAN SECTION     COLONOSCOPY     ELBOW SURGERY     left   I & D EXTREMITY Right 08/06/2020   Procedure: IRRIGATION AND DEBRIDEMENT OF LEG, wound vac placement;  Surgeon: [name], MD;  Location: [facility];  Service: Orthopedics;  Laterality: Right;   I & D EXTREMITY Right 08/11/2020   Procedure: IRRIGATION AND DEBRIDEMENT EXTREMITY SKIN GRAFT;  Surgeon: [name], MD;  Location: [facility];  Service: Orthopedics;  Laterality: Right;   JOINT REPLACEMENT     KNEE ARTHROPLASTY Left    March 2021   MYOMECTOMY     REPLACEMENT TOTAL KNEE Left 03/2019   TOTAL KNEE ARTHROPLASTY Right 06/26/2018   Procedure: TOTAL KNEE ARTHROPLASTY;  Surgeon: Paralee Cancel, MD;  [facility];  Service: Orthopedics;  Laterality: Right;  70 mins   Social History   Occupational History   Not on file  Tobacco Use   Smoking status: Never   Smokeless tobacco: Never  Vaping Use   Vaping Use: Never used  Substance and Sexual Activity   Alcohol use: Yes    Comment: occ   Drug use: No   Sexual activity: Yes

## 2020-09-01 ENCOUNTER — Other Ambulatory Visit: Payer: Self-pay

## 2020-09-01 ENCOUNTER — Encounter: Payer: Self-pay | Admitting: Orthopedic Surgery

## 2020-09-01 ENCOUNTER — Ambulatory Visit (INDEPENDENT_AMBULATORY_CARE_PROVIDER_SITE_OTHER): Payer: Medicare Other | Admitting: Orthopedic Surgery

## 2020-09-01 DIAGNOSIS — S81801D Unspecified open wound, right lower leg, subsequent encounter: Secondary | ICD-10-CM

## 2020-09-01 NOTE — Progress Notes (Signed)
Office Visit Note   Patient: Yolanda Peters           Date of Birth: 1940-09-14           MRN: [id number] Visit Date: 09/01/2020              Requested by: Shon Baton, MD [address] [place],  [place] [address] PCP: Shon Baton, MD  Chief Complaint  Patient presents with   Right Leg - Follow-up      HPI: Patient is a 80 year old woman who presents in follow-up status post skin graft and debridement right leg wound.  She is approximately 3 weeks out.  She has been wearing the 15 to 20 mm knee-high compression stocking.  Assessment & Plan: Visit Diagnoses:  1. Leg wound, right, subsequent encounter     Plan: Patient is currently using the size medium stocking she is given to sleeves she will wear the sleeves over the stocking during waking hours sleep in the sock around-the-clock wash daily with soap and water.  Follow-Up Instructions: Return in about 3 weeks (around 09/22/2020).   Ortho Exam  Patient is alert, oriented, no adenopathy, well-dressed, normal affect, normal respiratory effort. Examination patient shows progressive healing of the right leg wound there is approximately one third of the wound has healthy epithelialization.  The remainder of the wound is approximately 50% granulation tissue 50% fibrinous tissue there is minimal drainage no odor no cellulitis no signs of infection.  Imaging: No results found.    Labs: Lab Results  Component Value Date   ESRSEDRATE 2 01/22/2018   ESRSEDRATE 2 03/09/2017   REPTSTATUS 08/11/2020 FINAL 08/06/2020   GRAMSTAIN  08/06/2020    MODERATE WBC PRESENT, PREDOMINANTLY PMN NO ORGANISMS SEEN    CULT  08/06/2020    RARE STREPTOCOCCUS AGALACTIAE TESTING AGAINST S. AGALACTIAE NOT ROUTINELY PERFORMED DUE TO PREDICTABILITY OF AMP/PEN/VAN SUSCEPTIBILITY. NO ANAEROBES ISOLATED Performed at [facility], [place] [address] [place], [place] [address]      Lab Results  Component Value Date   ALBUMIN 4.0 08/05/2020    ALBUMIN 4.2 08/17/2016   ALBUMIN 4.2 01/13/2016    No results found for: MG Lab Results  Component Value Date   VD25OH 40 03/09/2017   VD25OH 71 12/30/2011   VD25OH 50 12/30/2010    No results found for: PREALBUMIN CBC EXTENDED Latest Ref Rng & Units 08/05/2020 08/05/2020 06/27/2018  WBC 4.0 - 10.5 K/uL 7.0 8.7 11.4(H)  RBC 3.87 - 5.11 MIL/uL 4.24 4.38 3.55(L)  HGB 12.0 - 15.0 g/dL 12.2 12.5 9.9(L)  HCT 36.0 - 46.0 % 38.0 39.7 31.8(L)  PLT 150 - 400 K/uL 298 352 233  NEUTROABS 1.7 - 7.7 K/uL - 5.6 -  LYMPHSABS 0.7 - 4.0 K/uL - 1.8 -     There is no height or weight on file to calculate BMI.  Orders:  No orders of the defined types were placed in this encounter.  No orders of the defined types were placed in this encounter.    Procedures: No procedures performed  Clinical Data: No additional findings.  ROS:  All other systems negative, except as noted in the HPI. Review of Systems  Objective: Vital Signs: There were no vitals taken for this visit.  Specialty Comments:  No specialty comments available.  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   RA (rheumatoid arthritis) ([facility]) 02/24/2014   HTN (hypertension) 02/24/2014   Hyperlipidemia 02/24/2014   Breast cancer, right, upper outer quadrant  07/05/2010   Past Medical History:  Diagnosis Date   Allergy    Arthritis    Breast cancer ([facility]) 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, skin cnacer   Cancer ([facility])    right breast    Hyperlipidemia    no meds taken   Hypertension     Personal history of radiation therapy 2011   rt breast    Family History  Problem Relation Age of Onset   Cancer Mother        bladder   Cancer Father        leukemia   Cancer Maternal Aunt        breast   Colon cancer Paternal Aunt        pt is unsure of this   Colon cancer Cousin        pt is unsure of this   Esophageal cancer Neg Hx    Rectal cancer Neg Hx    Stomach cancer Neg Hx     Past Surgical History:  Procedure Laterality Date   BREAST DUCTAL SYSTEM EXCISION     right breast   BREAST LUMPECTOMY     CATARACT EXTRACTION     bilateral   CESAREAN SECTION     COLONOSCOPY     ELBOW SURGERY     left   I & D EXTREMITY Right 08/06/2020   Procedure: IRRIGATION AND DEBRIDEMENT OF LEG, wound vac placement;  Surgeon: [name], MD;  Location: [facility];  Service: Orthopedics;  Laterality: Right;   I & D EXTREMITY Right 08/11/2020   Procedure: IRRIGATION AND DEBRIDEMENT EXTREMITY SKIN GRAFT;  Surgeon: [name], MD;  Location: [facility];  Service: Orthopedics;  Laterality: Right;   JOINT REPLACEMENT     KNEE ARTHROPLASTY Left    March 2021   MYOMECTOMY     REPLACEMENT TOTAL KNEE Left 03/2019   TOTAL KNEE ARTHROPLASTY Right 06/26/2018   Procedure: TOTAL KNEE ARTHROPLASTY;  Surgeon: Paralee Cancel, MD;  [facility];  Service: Orthopedics;  Laterality: Right;  70 mins   Social History   Occupational History   Not on file  Tobacco Use   Smoking status: Never   Smokeless tobacco: Never  Vaping Use   Vaping Use: Never used  Substance and Sexual Activity   Alcohol use: Yes    Comment: occ   Drug use: No   Sexual activity: Yes

## 2020-09-03 ENCOUNTER — Encounter: Payer: Self-pay | Admitting: Orthopedic Surgery

## 2020-09-03 NOTE — Progress Notes (Signed)
Office Visit Note   Patient: Yolanda Peters           Date of Birth: 11/24/1940           MRN: [id number] Visit Date: 08/25/2020              Requested by: Shon Baton, MD [address] [place],  [place] [address] PCP: Shon Baton, MD  Chief Complaint  Patient presents with   Right Leg - Pain      HPI: Patient is a 80 year old woman who presents in follow-up status post skin grafting and wound debridement for traumatic ulcer right leg.  Patient is currently washing with Dial soap and water.  Assessment & Plan: Visit Diagnoses:  1. Traumatic ulcer of right lower leg, limited to breakdown of skin ([place])     Plan: Will apply a Dynaflex wrap today and follow-up twice a week for compression wraps once the tissue graft is stable will transition to a large compression sock.  Follow-Up Instructions: Return in about 1 week (around 09/01/2020).   Ortho Exam  Patient is alert, oriented, no adenopathy, well-dressed, normal affect, normal respiratory effort. Examination patient shows new epithelialization over the superior aspect of the wound there is increased swelling her calf measures 37 cm in circumference there is good petechial bleeding  Imaging: No results found. No images are attached to the encounter.  Labs: Lab Results  Component Value Date   ESRSEDRATE 2 01/22/2018   ESRSEDRATE 2 03/09/2017   REPTSTATUS 08/11/2020 FINAL 08/06/2020   GRAMSTAIN  08/06/2020    MODERATE WBC PRESENT, PREDOMINANTLY PMN NO ORGANISMS SEEN    CULT  08/06/2020    RARE STREPTOCOCCUS AGALACTIAE TESTING AGAINST S. AGALACTIAE NOT ROUTINELY PERFORMED DUE TO PREDICTABILITY OF AMP/PEN/VAN SUSCEPTIBILITY. NO ANAEROBES ISOLATED Performed at [facility], [place] [address] [place], [place] [address]      Lab Results  Component Value Date   ALBUMIN 4.0 08/05/2020   ALBUMIN 4.2 08/17/2016   ALBUMIN 4.2 01/13/2016    No results found for: MG Lab Results  Component Value Date   VD25OH 40  03/09/2017   VD25OH 71 12/30/2011   VD25OH 50 12/30/2010    No results found for: PREALBUMIN CBC EXTENDED Latest Ref Rng & Units 08/05/2020 08/05/2020 06/27/2018  WBC 4.0 - 10.5 K/uL 7.0 8.7 11.4(H)  RBC 3.87 - 5.11 MIL/uL 4.24 4.38 3.55(L)  HGB 12.0 - 15.0 g/dL 12.2 12.5 9.9(L)  HCT 36.0 - 46.0 % 38.0 39.7 31.8(L)  PLT 150 - 400 K/uL 298 352 233  NEUTROABS 1.7 - 7.7 K/uL - 5.6 -  LYMPHSABS 0.7 - 4.0 K/uL - 1.8 -     There is no height or weight on file to calculate BMI.  Orders:  No orders of the defined types were placed in this encounter.  No orders of the defined types were placed in this encounter.    Procedures: No procedures performed  Clinical Data: No additional findings.  ROS:  All other systems negative, except as noted in the HPI. Review of Systems  Objective: Vital Signs: There were no vitals taken for this visit.  Specialty Comments:  No specialty comments available.  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   RA (rheumatoid arthritis) ([place]) 02/24/2014   HTN (hypertension) 02/24/2014   Hyperlipidemia 02/24/2014   Breast cancer, right, upper outer quadrant  07/05/2010   Past Medical History:  Diagnosis Date   Allergy    Arthritis    Breast cancer ([facility]) 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, skin cnacer   Cancer ([place])    right breast    Hyperlipidemia    no meds taken   Hypertension    Personal history of radiation therapy 2011   rt breast    Family History  Problem Relation Age of Onset   Cancer Mother         bladder   Cancer Father        leukemia   Cancer Maternal Aunt        breast   Colon cancer Paternal Aunt        pt is unsure of this   Colon cancer Cousin        pt is unsure of this   Esophageal cancer Neg Hx    Rectal cancer Neg Hx    Stomach cancer Neg Hx     Past Surgical History:  Procedure Laterality Date   BREAST DUCTAL SYSTEM EXCISION     right breast   BREAST LUMPECTOMY     CATARACT EXTRACTION     bilateral   CESAREAN SECTION     COLONOSCOPY     ELBOW SURGERY     left   I & D EXTREMITY Right 08/06/2020   Procedure: IRRIGATION AND DEBRIDEMENT OF LEG, wound vac placement;  Surgeon: [name], MD;  Location: [facility];  Service: Orthopedics;  Laterality: Right;   I & D EXTREMITY Right 08/11/2020   Procedure: IRRIGATION AND DEBRIDEMENT EXTREMITY SKIN GRAFT;  Surgeon: [name], MD;  Location: [facility];  Service: Orthopedics;  Laterality: Right;   JOINT REPLACEMENT     KNEE ARTHROPLASTY Left    March 2021   MYOMECTOMY     REPLACEMENT TOTAL KNEE Left 03/2019   TOTAL KNEE ARTHROPLASTY Right 06/26/2018   Procedure: TOTAL KNEE ARTHROPLASTY;  Surgeon: Paralee Cancel, MD;  [facility];  Service: Orthopedics;  Laterality: Right;  70 mins   Social History   Occupational History   Not on file  Tobacco Use   Smoking status: Never   Smokeless tobacco: Never  Vaping Use   Vaping Use: Never used  Substance and Sexual Activity   Alcohol use: Yes    Comment: occ   Drug use: No   Sexual activity: Yes

## 2020-09-04 NOTE — Progress Notes (Signed)
Office Visit Note  Patient: Yolanda Peters             Date of Birth: 06/15/1940           MRN: [id number]             PCP: Shon Baton, MD Referring: Shon Baton, MD Visit Date: 09/18/2020 Occupation: '@GUAROCC'$ @  Subjective:  Pain in the knee joints   History of Present Illness: Yolanda Peters is a 80 y.o. female with a history of osteoarthritis and autoimmune disease.  She states she fell in July and scraped her right leg.  She was sent to her care of Dr. Sharol Given.  She has been using compression socks which has been helpful and the area is gradually healing.  She continues to have some discomfort in her hands due to severe osteoarthritis.  Although she has not been able to do most of her activities.  She is off-and-on discomfort in her knee joints.  She states she used to do water aerobics but due to recent injury she could not do water aerobics.  She has not been exercising as much as previously.  She denies any history of oral ulcers, nasal ulcers, malar rash, photosensitivity, sicca symptoms, Raynaud's phenomenon or lymphadenopathy.  She denies any history of inflammatory arthritis.  Activities of Daily Living:  Patient reports morning stiffness for 0 minutes.   Patient Denies nocturnal pain.  Difficulty dressing/grooming: Denies Difficulty climbing stairs: Reports Difficulty getting out of chair: Denies Difficulty using hands for taps, buttons, cutlery, and/or writing: Denies  Review of Systems  Constitutional:  Negative for fatigue, night sweats, weight gain and weight loss.  HENT:  Negative for mouth sores, trouble swallowing, trouble swallowing, mouth dryness and nose dryness.   Eyes:  Negative for pain, redness, itching, visual disturbance and dryness.  Respiratory:  Negative for cough, shortness of breath and difficulty breathing.   Cardiovascular:  Negative for chest pain, palpitations, hypertension, irregular heartbeat and swelling in legs/feet.  Gastrointestinal:  Negative  for blood in stool, constipation and diarrhea.  Endocrine: Negative for increased urination.  Genitourinary:  Negative for difficulty urinating and vaginal dryness.  Musculoskeletal:  Negative for joint pain, joint pain, joint swelling, myalgias, muscle weakness, morning stiffness, muscle tenderness and myalgias.  Skin:  Negative for color change, rash, hair loss, redness, skin tightness, ulcers and sensitivity to sunlight.  Allergic/Immunologic: Negative for susceptible to infections.  Neurological:  Negative for dizziness, numbness, headaches, memory loss, night sweats and weakness.  Hematological:  Positive for bruising/bleeding tendency. Negative for swollen glands.  Psychiatric/Behavioral:  Negative for depressed mood, confusion and sleep disturbance. The patient is not nervous/anxious.    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   RA (rheumatoid arthritis) ([place]) 02/24/2014   HTN (hypertension) 02/24/2014   Hyperlipidemia 02/24/2014   Breast cancer, right, upper outer quadrant  07/05/2010    Past Medical History:  Diagnosis Date   Allergy    Arthritis    Breast cancer ([place]) 2011  right breast, skin cnacer   Cancer ([place])    right breast    Hyperlipidemia    no meds taken   Hypertension    Personal history of radiation therapy 2011   rt breast    Family History  Problem Relation Age of Onset   Cancer Mother        bladder   Cancer Father        leukemia   Cancer Maternal Aunt        breast   Colon cancer Paternal Aunt         pt is unsure of this   Colon cancer Cousin        pt is unsure of this   Esophageal cancer Neg Hx    Rectal cancer Neg Hx    Stomach cancer Neg Hx    Past Surgical History:  Procedure Laterality Date   BREAST DUCTAL SYSTEM EXCISION     right breast   BREAST LUMPECTOMY     CATARACT EXTRACTION     bilateral   CESAREAN SECTION     COLONOSCOPY     ELBOW SURGERY     left   I & D EXTREMITY Right 08/06/2020   Procedure: IRRIGATION AND DEBRIDEMENT OF LEG, wound vac placement;  Surgeon: [name], MD;  Location: [facility];  Service: Orthopedics;  Laterality: Right;   I & D EXTREMITY Right 08/11/2020   Procedure: IRRIGATION AND DEBRIDEMENT EXTREMITY SKIN GRAFT;  Surgeon: [name], MD;  Location: [facility];  Service: Orthopedics;  Laterality: Right;   JOINT REPLACEMENT     KNEE ARTHROPLASTY Left    March 2021   MYOMECTOMY     REPLACEMENT TOTAL KNEE Left 03/2019   TOTAL KNEE ARTHROPLASTY Right 06/26/2018   Procedure: TOTAL KNEE ARTHROPLASTY;  Surgeon: Paralee Cancel, MD;  Location: [facility];  Service: Orthopedics;  Laterality: Right;  70 mins   Social History   Social History Narrative   Not on file   Immunization History  Administered Date(s) Administered   Influenza Split 09/24/2013, 09/24/2014   PFIZER(Purple Top)SARS-COV-2 Vaccination 01/22/2019, 02/11/2019   Tdap 08/28/2014   Zoster Recombinat (Shingrix) 10/12/2017     Objective: Vital Signs: BP (!) 156/100 (BP Location: Left Arm, Patient Position: Sitting, Cuff Size: Normal)   Pulse 90   Ht '5\' 4"'$  (1.626 m)   Wt 158 lb 12.8 oz (72 kg)   BMI 27.26 kg/m    Physical Exam Vitals and nursing note reviewed.  Constitutional:      Appearance: She is well-developed.  HENT:     Head: Normocephalic and atraumatic.  Eyes:     Conjunctiva/sclera: Conjunctivae normal.  Cardiovascular:     Rate and Rhythm: Normal rate and regular rhythm.     Heart sounds: Normal heart sounds.  Pulmonary:     Effort: Pulmonary effort is  normal.     Breath sounds: Normal breath sounds.  Abdominal:     General: Bowel sounds are normal.     Palpations: Abdomen is soft.  Musculoskeletal:     Cervical back: Normal range of motion.     Left lower leg: Edema present.  Lymphadenopathy:     Cervical: No cervical adenopathy.  Skin:    General: Skin is warm and dry.     Capillary Refill: Capillary refill takes less than 2 seconds.  Neurological:     Mental Status: She is alert and oriented to person, place, and time.  Psychiatric:  Behavior: Behavior normal.     Musculoskeletal Exam: C-spine was in good range of motion.  She had thoracic kyphosis.  Shoulder joints, elbow joints, wrist joints, MCPs PIPs and DIPs were in good range of motion.  She had bilateral PIP and DIP subluxation and severe osteoarthritis.  No synovitis was noted.  Hip joints and knee joints with good range of motion.  She had compression socks on her right lower extremity.  Edema was noted in the left lower extremity.  There was no tenderness over ankles or MTPs.  CDAI Exam: CDAI Score: -- Patient Global: --; Provider Global: -- Swollen: --; Tender: -- Joint Exam 09/18/2020   No joint exam has been documented for this visit   There is currently no information documented on the homunculus. Go to the Rheumatology activity and complete the homunculus joint exam.  Investigation: No additional findings.  Imaging: No results found.  Recent Labs: Lab Results  Component Value Date   WBC 6.7 09/11/2020   HGB 12.6 09/11/2020   PLT 367 09/11/2020   NA 141 09/11/2020   K 4.0 09/11/2020   CL 105 09/11/2020   CO2 26 09/11/2020   GLUCOSE 97 09/11/2020   BUN 21 09/11/2020   CREATININE 0.79 09/11/2020   BILITOT 0.6 09/11/2020   ALKPHOS 87 08/05/2020   AST 15 09/11/2020   ALT 12 09/11/2020   PROT 6.3 09/11/2020   ALBUMIN 4.0 08/05/2020   CALCIUM 9.2 09/11/2020   GFRAA >60 06/27/2018    Speciality Comments: [facility]  Procedures:  No procedures performed Allergies: Penicillins and Tetracyclines & related   Assessment / Plan:     Visit Diagnoses: Primary osteoarthritis of both hands-she has severe osteoarthritis involving her hands with PIP and DIP subluxation.  Joint protection muscle strengthening was discussed.  A handout on hand exercises was given.  Status post total bilateral knee replacement-her both knee joints are replaced which causes discomfort off and on.  She had recent fall and she scraped her right lower extremity.  She is wearing a compression socks and she states that the wound is gradually healing.  Patient is unable to go to water aerobics due to the wound on her right lower extremity.  I gave her a handout on lower extremity exercises.  Primary osteoarthritis of both feet-she had no tenderness over MTPs or PIPs.  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  She denies any history of oral ulcers, nasal ulcers, malar rash, photosensitivity, sicca symptoms or lymphadenopathy.  She had recent labs in September which were all negative including double-stranded DNA sedimentation rate and complements.  I advised her to contact me in case she develops any new symptoms.  Other medical problems are listed as follows:  History of hyperlipidemia  History of breast cancer  History of hypertension  Osteoporosis screening - Patient will get her DEXA scan with Dr. Milta Deiters her GYN.  Orders: No orders of the defined types were placed in this encounter.  No orders of the defined types were placed in this encounter.    Follow-Up Instructions: Return in about 1 year (around 09/18/2021) for Osteoarthritis.   Bo Merino, MD  Note - This record has been created using Editor, commissioning.  Chart creation errors have been sought, but may not always  have been located. Such creation errors do not reflect on  the standard of medical care.

## 2020-09-11 ENCOUNTER — Other Ambulatory Visit: Payer: Self-pay | Admitting: *Deleted

## 2020-09-11 ENCOUNTER — Ambulatory Visit: Payer: Medicare Other | Admitting: Rheumatology

## 2020-09-11 DIAGNOSIS — M359 Systemic involvement of connective tissue, unspecified: Secondary | ICD-10-CM

## 2020-09-11 DIAGNOSIS — Z79899 Other long term (current) drug therapy: Secondary | ICD-10-CM

## 2020-09-16 NOTE — Progress Notes (Signed)
CBC, CMP are normal. Double-stranded DNA, complements and sedimentation rate are normal. UA is consistent with urinary tract infection.  Please advise patient to schedule an appointment with her PCP for the treatment of UTI.

## 2020-09-17 LAB — URINALYSIS, ROUTINE W REFLEX MICROSCOPIC
Bilirubin Urine: NEGATIVE
Glucose, UA: NEGATIVE
Ketones, ur: NEGATIVE
Nitrite: POSITIVE — AB
Specific Gravity, Urine: 1.018 (ref 1.001–1.035)
WBC, UA: >=60 /HPF — AB (ref 0–5)
pH: 6 (ref 5.0–8.0)

## 2020-09-17 LAB — COMPLETE METABOLIC PANEL WITH GFR
AG Ratio: 1.6 (calc) (ref 1.0–2.5)
ALT: 12 U/L (ref 6–29)
AST: 15 U/L (ref 10–35)
Albumin: 3.9 g/dL (ref 3.6–5.1)
Alkaline phosphatase (APISO): 96 U/L (ref 37–153)
BUN: 21 mg/dL (ref 7–25)
CO2: 26 mmol/L (ref 20–32)
Calcium: 9.2 mg/dL (ref 8.6–10.4)
Chloride: 105 mmol/L (ref 98–110)
Creat: 0.79 mg/dL (ref 0.60–1.00)
Globulin: 2.4 g/dL (calc) (ref 1.9–3.7)
Glucose, Bld: 97 mg/dL (ref 65–99)
Potassium: 4 mmol/L (ref 3.5–5.3)
Sodium: 141 mmol/L (ref 135–146)
Total Bilirubin: 0.6 mg/dL (ref 0.2–1.2)
Total Protein: 6.3 g/dL (ref 6.1–8.1)
eGFR: 76 mL/min/{1.73_m2} (ref >=60)

## 2020-09-17 LAB — CBC WITH DIFFERENTIAL/PLATELET
Absolute Monocytes: 657 cells/uL (ref 200–950)
Basophils Absolute: 60 cells/uL (ref 0–200)
Basophils Relative: 0.9 %
Eosinophils Absolute: 322 cells/uL (ref 15–500)
Eosinophils Relative: 4.8 %
HCT: 39.2 % (ref 35.0–45.0)
Hemoglobin: 12.6 g/dL (ref 11.7–15.5)
Lymphs Abs: 1528 cells/uL (ref 850–3900)
MCH: 28.1 pg (ref 27.0–33.0)
MCHC: 32.1 g/dL (ref 32.0–36.0)
MCV: 87.5 fL (ref 80.0–100.0)
MPV: 9.3 fL (ref 7.5–12.5)
Monocytes Relative: 9.8 %
Neutro Abs: 4134 cells/uL (ref 1500–7800)
Neutrophils Relative %: 61.7 %
Platelets: 367 10*3/uL (ref 140–400)
RBC: 4.48 10*6/uL (ref 3.80–5.10)
RDW: 14 % (ref 11.0–15.0)
Total Lymphocyte: 22.8 %
WBC: 6.7 10*3/uL (ref 3.8–10.8)

## 2020-09-17 LAB — MICROSCOPIC MESSAGE

## 2020-09-17 LAB — SEDIMENTATION RATE: Sed Rate: 9 mm/h (ref 0–30)

## 2020-09-17 LAB — C3 AND C4
C3 Complement: 146 mg/dL (ref 83–193)
C4 Complement: 33 mg/dL (ref 15–57)

## 2020-09-17 LAB — ANTI-DNA ANTIBODY, DOUBLE-STRANDED: ds DNA Ab: <1 IU/mL

## 2020-09-17 LAB — ANA: Anti Nuclear Antibody (ANA): POSITIVE — AB

## 2020-09-17 LAB — ANTI-NUCLEAR AB-TITER (ANA TITER): ANA Titer 1: 1:80 {titer} — ABNORMAL HIGH

## 2020-09-18 ENCOUNTER — Ambulatory Visit (INDEPENDENT_AMBULATORY_CARE_PROVIDER_SITE_OTHER): Payer: Medicare Other | Admitting: Rheumatology

## 2020-09-18 ENCOUNTER — Telehealth: Payer: Self-pay | Admitting: *Deleted

## 2020-09-18 ENCOUNTER — Other Ambulatory Visit: Payer: Self-pay

## 2020-09-18 ENCOUNTER — Encounter: Payer: Self-pay | Admitting: Rheumatology

## 2020-09-18 VITALS — BP 156/100 | HR 90 | Ht 64.0 in | Wt 158.8 lb

## 2020-09-18 DIAGNOSIS — M19041 Primary osteoarthritis, right hand: Secondary | ICD-10-CM | POA: Diagnosis not present

## 2020-09-18 DIAGNOSIS — M19071 Primary osteoarthritis, right ankle and foot: Secondary | ICD-10-CM

## 2020-09-18 DIAGNOSIS — Z853 Personal history of malignant neoplasm of breast: Secondary | ICD-10-CM

## 2020-09-18 DIAGNOSIS — M19042 Primary osteoarthritis, left hand: Secondary | ICD-10-CM

## 2020-09-18 DIAGNOSIS — Z13820 Encounter for screening for osteoporosis: Secondary | ICD-10-CM

## 2020-09-18 DIAGNOSIS — Z8639 Personal history of other endocrine, nutritional and metabolic disease: Secondary | ICD-10-CM

## 2020-09-18 DIAGNOSIS — G5703 Lesion of sciatic nerve, bilateral lower limbs: Secondary | ICD-10-CM

## 2020-09-18 DIAGNOSIS — M359 Systemic involvement of connective tissue, unspecified: Secondary | ICD-10-CM | POA: Diagnosis not present

## 2020-09-18 DIAGNOSIS — Z8679 Personal history of other diseases of the circulatory system: Secondary | ICD-10-CM

## 2020-09-18 DIAGNOSIS — Z79899 Other long term (current) drug therapy: Secondary | ICD-10-CM

## 2020-09-18 DIAGNOSIS — Z96653 Presence of artificial knee joint, bilateral: Secondary | ICD-10-CM | POA: Diagnosis not present

## 2020-09-18 DIAGNOSIS — M19072 Primary osteoarthritis, left ankle and foot: Secondary | ICD-10-CM

## 2020-09-18 NOTE — Patient Instructions (Signed)
Knee Exercises Ask your health care provider which exercises are safe for you. Do exercises exactly as told by your health care provider and adjust them as directed. It is normal to feel mild stretching, pulling, tightness, or discomfort as you do these exercises. Stop right away if you feel sudden pain or your pain gets worse. Do not begin these exercises until told by your health care provider. Stretching and range-of-motion exercises These exercises warm up your muscles and joints and improve the movement and flexibility of your knee. These exercises also help to relieve pain and swelling. Knee extension, prone Lie on your abdomen (prone position) on a bed. Place your left / right knee just beyond the edge of the surface so your knee is not on the bed. You can put a towel under your left / right thigh just above your kneecap for comfort. Relax your leg muscles and allow gravity to straighten your knee (extension). You should feel a stretch behind your left / right knee. Hold this position for __________ seconds. Scoot up so your knee is supported between repetitions. Repeat __________ times. Complete this exercise __________ times a day. Knee flexion, active  Lie on your back with both legs straight. If this causes back discomfort, bend your left / right knee so your foot is flat on the floor. Slowly slide your left / right heel back toward your buttocks. Stop when you feel a gentle stretch in the front of your knee or thigh (flexion). Hold this position for __________ seconds. Slowly slide your left / right heel back to the starting position. Repeat __________ times. Complete this exercise __________ times a day. Quadriceps stretch, prone  Lie on your abdomen on a firm surface, such as a bed or padded floor. Bend your left / right knee and hold your ankle. If you cannot reach your ankle or pant leg, loop a belt around your foot and grab the belt instead. Gently pull your heel toward your  buttocks. Your knee should not slide out to the side. You should feel a stretch in the front of your thigh and knee (quadriceps). Hold this position for __________ seconds. Repeat __________ times. Complete this exercise __________ times a day. Hamstring, supine Lie on your back (supine position). Loop a belt or towel over the ball of your left / right foot. The ball of your foot is on the walking surface, right under your toes. Straighten your left / right knee and slowly pull on the belt to raise your leg until you feel a gentle stretch behind your knee (hamstring). Do not let your knee bend while you do this. Keep your other leg flat on the floor. Hold this position for __________ seconds. Repeat __________ times. Complete this exercise __________ times a day. Strengthening exercises These exercises build strength and endurance in your knee. Endurance is the ability to use your muscles for a long time, even after they get tired. Quadriceps, isometric This exercise stretches the muscles in front of your thigh (quadriceps) without moving your knee joint (isometric). Lie on your back with your left / right leg extended and your other knee bent. Put a rolled towel or small pillow under your knee if told by your health care provider. Slowly tense the muscles in the front of your left / right thigh. You should see your kneecap slide up toward your hip or see increased dimpling just above the knee. This motion will push the back of the knee toward the floor. For __________   seconds, hold the muscle as tight as you can without increasing your pain. Relax the muscles slowly and completely. Repeat __________ times. Complete this exercise __________ times a day. Straight leg raises This exercise stretches the muscles in front of your thigh (quadriceps) and the muscles that move your hips (hip flexors). Lie on your back with your left / right leg extended and your other knee bent. Tense the muscles in  the front of your left / right thigh. You should see your kneecap slide up or see increased dimpling just above the knee. Your thigh may even shake a bit. Keep these muscles tight as you raise your leg 4-6 inches (10-15 cm) off the floor. Do not let your knee bend. Hold this position for __________ seconds. Keep these muscles tense as you lower your leg. Relax your muscles slowly and completely after each repetition. Repeat __________ times. Complete this exercise __________ times a day. Hamstring, isometric Lie on your back on a firm surface. Bend your left / right knee about __________ degrees. Dig your left / right heel into the surface as if you are trying to pull it toward your buttocks. Tighten the muscles in the back of your thighs (hamstring) to "dig" as hard as you can without increasing any pain. Hold this position for __________ seconds. Release the tension gradually and allow your muscles to relax completely for __________ seconds after each repetition. Repeat __________ times. Complete this exercise __________ times a day. Hamstring curls If told by your health care provider, do this exercise while wearing ankle weights. Begin with __________ lb weights. Then increase the weight by 1 lb (0.5 kg) increments. Do not wear ankle weights that are more than __________ lb. Lie on your abdomen with your legs straight. Bend your left / right knee as far as you can without feeling pain. Keep your hips flat against the floor. Hold this position for __________ seconds. Slowly lower your leg to the starting position. Repeat __________ times. Complete this exercise __________ times a day. Squats This exercise strengthens the muscles in front of your thigh and knee (quadriceps). Stand in front of a table, with your feet and knees pointing straight ahead. You may rest your hands on the table for balance but not for support. Slowly bend your knees and lower your hips like you are going to sit in a  chair. Keep your weight over your heels, not over your toes. Keep your lower legs upright so they are parallel with the table legs. Do not let your hips go lower than your knees. Do not bend lower than told by your health care provider. If your knee pain increases, do not bend as low. Hold the squat position for __________ seconds. Slowly push with your legs to return to standing. Do not use your hands to pull yourself to standing. Repeat __________ times. Complete this exercise __________ times a day. Wall slides This exercise strengthens the muscles in front of your thigh and knee (quadriceps). Lean your back against a smooth wall or door, and walk your feet out 18-24 inches (46-61 cm) from it. Place your feet hip-width apart. Slowly slide down the wall or door until your knees bend __________ degrees. Keep your knees over your heels, not over your toes. Keep your knees in line with your hips. Hold this position for __________ seconds. Repeat __________ times. Complete this exercise __________ times a day. Straight leg raises This exercise strengthens the muscles that rotate the leg at the hip and   move it away from your body (hip abductors). Lie on your side with your left / right leg in the top position. Lie so your head, shoulder, knee, and hip line up. You may bend your bottom knee to help you keep your balance. Roll your hips slightly forward so your hips are stacked directly over each other and your left / right knee is facing forward. Leading with your heel, lift your top leg 4-6 inches (10-15 cm). You should feel the muscles in your outer hip lifting. Do not let your foot drift forward. Do not let your knee roll toward the ceiling. Hold this position for __________ seconds. Slowly return your leg to the starting position. Let your muscles relax completely after each repetition. Repeat __________ times. Complete this exercise __________ times a day. Straight leg raises This  exercise stretches the muscles that move your hips away from the front of the pelvis (hip extensors). Lie on your abdomen on a firm surface. You can put a pillow under your hips if that is more comfortable. Tense the muscles in your buttocks and lift your left / right leg about 4-6 inches (10-15 cm). Keep your knee straight as you lift your leg. Hold this position for __________ seconds. Slowly lower your leg to the starting position. Let your leg relax completely after each repetition. Repeat __________ times. Complete this exercise __________ times a day. This information is not intended to replace advice given to you by your health care provider. Make sure you discuss any questions you have with your health care provider. Document Revised: 10/17/2017 Document Reviewed: 10/17/2017 Elsevier Patient Education  [place] Exercises Hand exercises can be helpful for almost anyone. These exercises can strengthen the hands, improve flexibility and movement, and increase blood flow to the hands. These results can make work and daily tasks easier. Hand exercises can be especially helpful for people who have joint pain from arthritis or have nerve damage from overuse (carpal tunnel syndrome). These exercises can also help people who have injured a hand. Exercises Most of these hand exercises are gentle stretching and motion exercises. It is usually safe to do them often throughout the day. Warming up your hands before exercise may help to reduce stiffness. You can do this with gentle massage or by placing your hands in warm water for 10-15 minutes. It is normal to feel some stretching, pulling, tightness, or mild discomfort as you begin new exercises. This will gradually improve. Stop an exercise right away if you feel sudden, severe pain or your pain gets worse. Ask your health care provider which exercises are best for you. Knuckle bend or "claw" fist  Stand or sit with your arm, hand,  and all five fingers pointed straight up. Make sure to keep your wrist straight during the exercise. Gently bend your fingers down toward your palm until the tips of your fingers are touching the top of your palm. Keep your big knuckle straight and just bend the small knuckles in your fingers. Hold this position for __________ seconds. Straighten (extend) your fingers back to the starting position. Repeat this exercise 5-10 times with each hand. Full finger fist  Stand or sit with your arm, hand, and all five fingers pointed straight up. Make sure to keep your wrist straight during the exercise. Gently bend your fingers into your palm until the tips of your fingers are touching the middle of your palm. Hold this position for __________ seconds. Extend your fingers back to the  starting position, stretching every joint fully. Repeat this exercise 5-10 times with each hand. Straight fist Stand or sit with your arm, hand, and all five fingers pointed straight up. Make sure to keep your wrist straight during the exercise. Gently bend your fingers at the big knuckle, where your fingers meet your hand, and the middle knuckle. Keep the knuckle at the tips of your fingers straight and try to touch the bottom of your palm. Hold this position for __________ seconds. Extend your fingers back to the starting position, stretching every joint fully. Repeat this exercise 5-10 times with each hand. Tabletop  Stand or sit with your arm, hand, and all five fingers pointed straight up. Make sure to keep your wrist straight during the exercise. Gently bend your fingers at the big knuckle, where your fingers meet your hand, as far down as you can while keeping the small knuckles in your fingers straight. Think of forming a tabletop with your fingers. Hold this position for __________ seconds. Extend your fingers back to the starting position, stretching every joint fully. Repeat this exercise 5-10 times with each  hand. Finger spread  Place your hand flat on a table with your palm facing down. Make sure your wrist stays straight as you do this exercise. Spread your fingers and thumb apart from each other as far as you can until you feel a gentle stretch. Hold this position for __________ seconds. Bring your fingers and thumb tight together again. Hold this position for __________ seconds. Repeat this exercise 5-10 times with each hand. Making circles  Stand or sit with your arm, hand, and all five fingers pointed straight up. Make sure to keep your wrist straight during the exercise. Make a circle by touching the tip of your thumb to the tip of your index finger. Hold for __________ seconds. Then open your hand wide. Repeat this motion with your thumb and each finger on your hand. Repeat this exercise 5-10 times with each hand. Thumb motion  Sit with your forearm resting on a table and your wrist straight. Your thumb should be facing up toward the ceiling. Keep your fingers relaxed as you move your thumb. Lift your thumb up as high as you can toward the ceiling. Hold for __________ seconds. Bend your thumb across your palm as far as you can, reaching the tip of your thumb for the small finger (pinkie) side of your palm. Hold for __________ seconds. Repeat this exercise 5-10 times with each hand. Grip strengthening  Hold a stress ball or other soft ball in the middle of your hand. Slowly increase the pressure, squeezing the ball as much as you can without causing pain. Think of bringing the tips of your fingers into the middle of your palm. All of your finger joints should bend when doing this exercise. Hold your squeeze for __________ seconds, then relax. Repeat this exercise 5-10 times with each hand. Contact a health care provider if: Your hand pain or discomfort gets much worse when you do an exercise. Your hand pain or discomfort does not improve within 2 hours after you exercise. If you have  any of these problems, stop doing these exercises right away. Do not do them again unless your health care provider says that you can. Get help right away if: You develop sudden, severe hand pain or swelling. If this happens, stop doing these exercises right away. Do not do them again unless your health care provider says that you can. This information is  not intended to replace advice given to you by your health care provider. Make sure you discuss any questions you have with your health care provider. Document Revised: 04/16/2020 Document Reviewed: 04/16/2020 Elsevier Patient Education  [place].

## 2020-09-18 NOTE — Telephone Encounter (Signed)
Patient called regarding her labs results. Patient advised CBC, CMP are normal. Double-stranded DNA, complements and sedimentation rate are normal. UA is consistent with urinary tract infection.  Patient advised to schedule an appointment with her PCP for the treatment of UTI.ANA remains positive but is a low, nonspecific titer

## 2020-09-22 ENCOUNTER — Other Ambulatory Visit: Payer: Self-pay

## 2020-09-22 ENCOUNTER — Ambulatory Visit (INDEPENDENT_AMBULATORY_CARE_PROVIDER_SITE_OTHER): Payer: Medicare Other | Admitting: Orthopedic Surgery

## 2020-09-22 DIAGNOSIS — S81801D Unspecified open wound, right lower leg, subsequent encounter: Secondary | ICD-10-CM

## 2020-09-24 ENCOUNTER — Telehealth: Payer: Self-pay | Admitting: Orthopedic Surgery

## 2020-09-24 NOTE — Telephone Encounter (Signed)
Patient called. She would like a sleeve to go over her compressions. Her call back number is [phone]

## 2020-09-25 NOTE — Telephone Encounter (Signed)
This pt wears a large sleeve and is asking if you will bring in another one for her please? We could use a few in the office if you are able.

## 2020-09-28 NOTE — Telephone Encounter (Signed)
Called pt to advise that I have 2 sleeves here in the office ready for pick up. Lm on vm

## 2020-10-07 ENCOUNTER — Ambulatory Visit: Payer: Medicare Other | Attending: Internal Medicine

## 2020-10-07 ENCOUNTER — Other Ambulatory Visit: Payer: Self-pay

## 2020-10-07 ENCOUNTER — Other Ambulatory Visit (HOSPITAL_BASED_OUTPATIENT_CLINIC_OR_DEPARTMENT_OTHER): Payer: Self-pay

## 2020-10-07 DIAGNOSIS — Z23 Encounter for immunization: Secondary | ICD-10-CM

## 2020-10-07 MED ORDER — PFIZER COVID-19 VAC BIVALENT 30 MCG/0.3ML IM SUSP
INTRAMUSCULAR | 0 refills | Status: AC
  Filled 2020-10-07: qty 0.3, 1d supply, fill #0

## 2020-10-07 NOTE — Progress Notes (Signed)
   Covid-19 Vaccination Clinic  Name:  SOKHA CRAKER    MRN: [id number] DOB: September 11, 1940  10/07/2020  Ms. Feutz was observed post Covid-19 immunization for 15 minutes without incident. She was provided with Vaccine Information Sheet and instruction to access the V-Safe system.   Ms. Rampersad was instructed to call 911 with any severe reactions post vaccine: Difficulty breathing  Swelling of face and throat  A fast heartbeat  A bad rash all over body  Dizziness and weakness

## 2020-10-09 ENCOUNTER — Other Ambulatory Visit: Payer: Self-pay | Admitting: Internal Medicine

## 2020-10-09 DIAGNOSIS — Z1231 Encounter for screening mammogram for malignant neoplasm of breast: Secondary | ICD-10-CM

## 2020-10-20 ENCOUNTER — Ambulatory Visit (INDEPENDENT_AMBULATORY_CARE_PROVIDER_SITE_OTHER): Payer: Medicare Other | Admitting: Orthopedic Surgery

## 2020-10-20 ENCOUNTER — Encounter: Payer: Self-pay | Admitting: Orthopedic Surgery

## 2020-10-20 DIAGNOSIS — S81801D Unspecified open wound, right lower leg, subsequent encounter: Secondary | ICD-10-CM

## 2020-10-20 NOTE — Progress Notes (Signed)
Office Visit Note   Patient: Yolanda Peters           Date of Birth: [birth year]           MRN: [id number] Visit Date: 10/20/2020              Requested by: Shon Baton, MD [address] [place],  [place] [address] PCP: Shon Baton, MD  Chief Complaint  Patient presents with   Right Leg - Routine Post Op    08/11/2020 right leg I&D kerecis graft and powder       HPI: Patient is a 80 year old woman who presents 2 months status post application of Kerecis skin graft as well as Kerecis powder over the wound.  Patient states she feels great she has been wearing the 2 layers of compression stocking during the day 1 layer at night.  Assessment & Plan: Visit Diagnoses:  1. Leg wound, right, subsequent encounter     Plan: Continue with current wound care patient may resume swimming.    Follow-Up Instructions: Return in about 4 weeks (around 11/17/2020).   Ortho Exam  Patient is alert, oriented, no adenopathy, well-dressed, normal affect, normal respiratory effort. The proximal wound is 3 x 1 cm the distal wound is 2 x 1 cm both with healthy granulation tissue and excellent epithelialization around the wound edges.  Imaging: No results found.   Labs: Lab Results  Component Value Date   ESRSEDRATE 9 09/11/2020   ESRSEDRATE 2 01/22/2018   ESRSEDRATE 2 03/09/2017   REPTSTATUS 08/11/2020 FINAL 08/06/2020   GRAMSTAIN  08/06/2020    MODERATE WBC PRESENT, PREDOMINANTLY PMN NO ORGANISMS SEEN    CULT  08/06/2020    RARE STREPTOCOCCUS AGALACTIAE TESTING AGAINST S. AGALACTIAE NOT ROUTINELY PERFORMED DUE TO PREDICTABILITY OF AMP/PEN/VAN SUSCEPTIBILITY. NO ANAEROBES ISOLATED Performed at [facility], [place] [address] [place], [place] [address]      Lab Results  Component Value Date   ALBUMIN 4.0 08/05/2020   ALBUMIN 4.2 08/17/2016   ALBUMIN 4.2 01/13/2016    No results found for: MG Lab Results  Component Value Date   VD25OH 40 03/09/2017   VD25OH 71 12/30/2011    VD25OH 50 12/30/2010    No results found for: PREALBUMIN CBC EXTENDED Latest Ref Rng & Units 09/11/2020 08/05/2020 08/05/2020  WBC 3.8 - 10.8 Thousand/uL 6.7 7.0 8.7  RBC 3.80 - 5.10 Million/uL 4.48 4.24 4.38  HGB 11.7 - 15.5 g/dL 12.6 12.2 12.5  HCT 35.0 - 45.0 % 39.2 38.0 39.7  PLT 140 - 400 Thousand/uL 367 298 352  NEUTROABS 1,500 - 7,800 cells/uL 4,134 - 5.6  LYMPHSABS 850 - 3,900 cells/uL 1,528 - 1.8     There is no height or weight on file to calculate BMI.  Orders:  No orders of the defined types were placed in this encounter.  No orders of the defined types were placed in this encounter.    Procedures: No procedures performed  Clinical Data: No additional findings.  ROS:  All other systems negative, except as noted in the HPI. Review of Systems  Objective: Vital Signs: There were no vitals taken for this visit.  Specialty Comments:  No specialty comments available.  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   RA (rheumatoid arthritis) ([place]) 02/24/2014   HTN (hypertension) 02/24/2014   Hyperlipidemia 02/24/2014   Breast cancer, right, upper outer quadrant  07/05/2010   Past Medical History:  Diagnosis Date   Allergy    Arthritis    Breast cancer ([place]) 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, skin cnacer   Cancer ([place])    right breast    Hyperlipidemia    no meds taken   Hypertension    Personal history of radiation therapy 2011   rt breast    Family History  Problem Relation Age of Onset   Cancer Mother         bladder   Cancer Father        leukemia   Cancer Maternal Aunt        breast   Colon cancer Paternal Aunt        pt is unsure of this   Colon cancer Cousin        pt is unsure of this   Esophageal cancer Neg Hx    Rectal cancer Neg Hx    Stomach cancer Neg Hx     Past Surgical History:  Procedure Laterality Date   BREAST DUCTAL SYSTEM EXCISION     right breast   BREAST LUMPECTOMY     CATARACT EXTRACTION     bilateral   CESAREAN SECTION     COLONOSCOPY     ELBOW SURGERY     left   I & D EXTREMITY Right 08/06/2020   Procedure: IRRIGATION AND DEBRIDEMENT OF LEG, wound vac placement;  Surgeon: [name], MD;  Location: [facility];  Service: Orthopedics;  Laterality: Right;   I & D EXTREMITY Right 08/11/2020   Procedure: IRRIGATION AND DEBRIDEMENT EXTREMITY SKIN GRAFT;  Surgeon: [name], MD;  Location: [facility];  Service: Orthopedics;  Laterality: Right;   JOINT REPLACEMENT     KNEE ARTHROPLASTY Left    March 2021   MYOMECTOMY     REPLACEMENT TOTAL KNEE Left 03/2019   TOTAL KNEE ARTHROPLASTY Right 06/26/2018   Procedure: TOTAL KNEE ARTHROPLASTY;  Surgeon: Paralee Cancel, MD;  [facility];  Service: Orthopedics;  Laterality: Right;  70 mins   Social History   Occupational History   Not on file  Tobacco Use   Smoking status: Never   Smokeless tobacco: Never  Vaping Use   Vaping Use: Never used  Substance and Sexual Activity   Alcohol use: Yes    Comment: occ   Drug use: No   Sexual activity: Yes

## 2020-11-12 ENCOUNTER — Other Ambulatory Visit: Payer: Self-pay

## 2020-11-12 ENCOUNTER — Ambulatory Visit
Admission: RE | Admit: 2020-11-12 | Discharge: 2020-11-12 | Disposition: A | Discharge status: Home / Self Care | Payer: Medicare Other | Source: Ambulatory Visit | Attending: Internal Medicine | Admitting: Internal Medicine

## 2020-11-12 DIAGNOSIS — Z1231 Encounter for screening mammogram for malignant neoplasm of breast: Secondary | ICD-10-CM

## 2020-11-17 ENCOUNTER — Ambulatory Visit (INDEPENDENT_AMBULATORY_CARE_PROVIDER_SITE_OTHER): Payer: Medicare Other | Admitting: Orthopedic Surgery

## 2020-11-17 ENCOUNTER — Encounter: Payer: Self-pay | Admitting: Orthopedic Surgery

## 2020-11-17 ENCOUNTER — Other Ambulatory Visit: Payer: Self-pay

## 2020-11-17 DIAGNOSIS — I70234 Atherosclerosis of native arteries of right leg with ulceration of heel and midfoot: Secondary | ICD-10-CM

## 2020-11-17 DIAGNOSIS — S81801D Unspecified open wound, right lower leg, subsequent encounter: Secondary | ICD-10-CM

## 2020-11-17 NOTE — Progress Notes (Signed)
Office Visit Note   Patient: Yolanda Peters           Date of Birth: Feb 11, 1940           MRN: [id number] Visit Date: 09/22/2020              Requested by: Shon Baton, [place],   [id number] PCP: Shon Baton, MD  Chief Complaint  Patient presents with   Right Leg - Routine Post Op    08/11/20 I&D RLE Kerecis graft       HPI: Patient is status post application of Kerecis skin graft and Kerecis powder.  Assessment & Plan: Visit Diagnoses:  1. Leg wound, right, subsequent encounter     Plan: Continue with the compression socks and Dial soap cleansing.  Follow-Up Instructions: Return in about 1 week (around 09/29/2020).   Ortho Exam  Patient is alert, oriented, no adenopathy, well-dressed, normal affect, normal respiratory effort. Examination the wound bed has healthy granulation tissue there is no cellulitis no drainage no signs of infection.  Imaging: No results found.   Labs: Lab Results  Component Value Date   ESRSEDRATE 9 09/11/2020   ESRSEDRATE 2 01/22/2018   ESRSEDRATE 2 03/09/2017   REPTSTATUS 08/11/2020 FINAL 08/06/2020   GRAMSTAIN  08/06/2020    MODERATE WBC PRESENT, PREDOMINANTLY PMN NO ORGANISMS SEEN    CULT  08/06/2020    RARE STREPTOCOCCUS AGALACTIAE TESTING AGAINST S. AGALACTIAE NOT ROUTINELY PERFORMED DUE TO PREDICTABILITY OF AMP/PEN/VAN SUSCEPTIBILITY. NO ANAEROBES ISOLATED Performed at [facility], [place] [address], [place],  [address]      Lab Results  Component Value Date   ALBUMIN 4.0 08/05/2020   ALBUMIN 4.2 08/17/2016   ALBUMIN 4.2 01/13/2016    No results found for: MG Lab Results  Component Value Date   VD25OH 40 03/09/2017   VD25OH 71 12/30/2011   VD25OH 50 12/30/2010    No results found for: PREALBUMIN CBC EXTENDED Latest Ref Rng & Units 09/11/2020 08/05/2020 08/05/2020  WBC 3.8 - 10.8 Thousand/uL 6.7 7.0 8.7  RBC 3.80 - 5.10 Million/uL 4.48 4.24 4.38  HGB 11.7 - 15.5 g/dL 12.6 12.2 12.5   HCT 35.0 - 45.0 % 39.2 38.0 39.7  PLT 140 - 400 Thousand/uL 367 298 352  NEUTROABS 1,500 - 7,800 cells/uL 4,134 - 5.6  LYMPHSABS 850 - 3,900 cells/uL 1,528 - 1.8     There is no height or weight on file to calculate BMI.  Orders:  No orders of the defined types were placed in this encounter.  No orders of the defined types were placed in this encounter.    Procedures: No procedures performed  Clinical Data: No additional findings.  ROS:  All other systems negative, except as noted in the HPI. Review of Systems  Objective: Vital Signs: There were no vitals taken for this visit.  Specialty Comments:  No specialty comments available.  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   RA (rheumatoid arthritis) ([place]) 02/24/2014  HTN (hypertension) 02/24/2014   Hyperlipidemia 02/24/2014   Breast cancer, right, upper outer quadrant  07/05/2010   Past Medical History:  Diagnosis Date   Allergy    Arthritis    Breast cancer ([place]) 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, skin cnacer   Cancer ([place])    right breast    Hyperlipidemia    no meds taken   Hypertension    Personal history of radiation therapy 2011   rt breast    Family History  Problem Relation Age of Onset   Cancer Mother        bladder   Cancer Father        leukemia   Cancer Maternal Aunt        breast   Colon cancer Paternal Aunt        pt is unsure of this   Colon cancer Cousin        pt is unsure of this   Esophageal cancer Neg Hx    Rectal cancer Neg Hx     Stomach cancer Neg Hx     Past Surgical History:  Procedure Laterality Date   BREAST DUCTAL SYSTEM EXCISION     right breast   BREAST LUMPECTOMY     CATARACT EXTRACTION     bilateral   CESAREAN SECTION     COLONOSCOPY     ELBOW SURGERY     left   I & D EXTREMITY Right 08/06/2020   Procedure: IRRIGATION AND DEBRIDEMENT OF LEG, wound vac placement;  Surgeon: [name], MD;  Location: [facility];  Service: Orthopedics;  Laterality: Right;   I & D EXTREMITY Right 08/11/2020   Procedure: IRRIGATION AND DEBRIDEMENT EXTREMITY SKIN GRAFT;  Surgeon: [name], MD;  Location: [facility];  Service: Orthopedics;  Laterality: Right;   JOINT REPLACEMENT     KNEE ARTHROPLASTY Left    March 2021   MYOMECTOMY     REPLACEMENT TOTAL KNEE Left 03/2019   TOTAL KNEE ARTHROPLASTY Right 06/26/2018   Procedure: TOTAL KNEE ARTHROPLASTY;  Surgeon: Paralee Cancel, MD;  [facility];  Service: Orthopedics;  Laterality: Right;  70 mins   Social History   Occupational History   Not on file  Tobacco Use   Smoking status: Never   Smokeless tobacco: Never  Vaping Use   Vaping Use: Never used  Substance and Sexual Activity   Alcohol use: Yes    Comment: occ   Drug use: No   Sexual activity: Yes

## 2020-11-17 NOTE — Progress Notes (Signed)
Office Visit Note   Patient: Yolanda Peters           Date of Birth: 05/01/1940           MRN: [id number] Visit Date: 11/17/2020              Requested by: Shon Baton, [place],  [place] [address] PCP: Shon Baton, MD  Chief Complaint  Patient presents with   Right Leg - Follow-up    08/11/20 I&D RLE kerecis graft and powder     Woman who presents just over 3 months status post irrigation debridement traumatic wound right leg with placement of Kerecis graft and powder.  Patient has been wearing her compression socks but not today. HPI: Patient is a 80-year  Assessment & Plan: Visit Diagnoses:  1. 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 Leg wound, right, subsequent encounter     Plan: Recommended using a Shea butter cream for skin moisturizing daily.  Recommended continuing to wear her compression socks for least several years to allow time for the lymphatics to restore their function.  Follow-Up Instructions: Return if symptoms worsen or fail to improve.   Ortho Exam  Patient is alert, oriented, no adenopathy, well-dressed, normal affect, normal respiratory effort. Examination the wounds have completely healed she does have some swelling but not wearing her stockings today.  There is no redness no cellulitis no signs of infection she has excellent epithelization of the wounds.  Imaging: No results found.    Labs: Lab Results  Component Value Date   ESRSEDRATE 9 09/11/2020   ESRSEDRATE 2 01/22/2018   ESRSEDRATE 2 03/09/2017   REPTSTATUS 08/11/2020 FINAL 08/06/2020   GRAMSTAIN  08/06/2020    MODERATE WBC PRESENT, PREDOMINANTLY PMN NO ORGANISMS SEEN    CULT  08/06/2020    RARE STREPTOCOCCUS AGALACTIAE TESTING AGAINST S. AGALACTIAE NOT ROUTINELY PERFORMED DUE TO PREDICTABILITY OF AMP/PEN/VAN SUSCEPTIBILITY. NO ANAEROBES ISOLATED Performed at [facility], [place] [address], [place],  [place] [address]      Lab Results  Component Value Date   ALBUMIN 4.0 08/05/2020   ALBUMIN 4.2 08/17/2016   ALBUMIN 4.2 01/13/2016    No results found for: MG Lab Results  Component Value Date   VD25OH 40 03/09/2017   VD25OH 71 12/30/2011   VD25OH 50 12/30/2010    No results found for: PREALBUMIN CBC EXTENDED Latest Ref Rng & Units 09/11/2020 08/05/2020 08/05/2020  WBC 3.8 - 10.8 Thousand/uL 6.7 7.0 8.7  RBC 3.80 - 5.10 Million/uL 4.48 4.24 4.38  HGB 11.7 - 15.5 g/dL 12.6 12.2 12.5  HCT 35.0 - 45.0 % 39.2 38.0 39.7  PLT 140 - 400 Thousand/uL 367 298 352  NEUTROABS 1,500 - 7,800 cells/uL 4,134 - 5.6  LYMPHSABS 850 - 3,900 cells/uL 1,528 - 1.8     There is no height or weight on file to calculate BMI.  Orders:  No orders of the defined types were placed in this encounter.  No orders of the defined types were placed in this encounter.    Procedures: No procedures performed  Clinical Data: No additional findings.  ROS:  All other systems negative, except as noted in the HPI. Review of Systems  Objective: Vital Signs: There were no vitals taken for this visit.  Specialty Comments:  No specialty comments available.  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   RA (rheumatoid arthritis) ([place]) 02/24/2014   HTN (hypertension) 02/24/2014   Hyperlipidemia 02/24/2014   Breast cancer, right, upper outer quadrant  07/05/2010   Past Medical History:  Diagnosis Date   Allergy    Arthritis    Breast cancer ([facility]) 2011    right breast, skin cnacer   Cancer ([place])    right breast    Hyperlipidemia    no meds taken   Hypertension    Personal history of radiation therapy 2011   rt breast    Family History  Problem Relation Age of Onset   Cancer Mother        bladder   Cancer Father        leukemia   Cancer Maternal Aunt        breast   Colon cancer Paternal Aunt        pt is unsure of this   Colon cancer Cousin        pt is unsure of this   Esophageal cancer Neg Hx    Rectal cancer Neg Hx    Stomach cancer Neg Hx     Past Surgical History:  Procedure Laterality Date   BREAST DUCTAL SYSTEM EXCISION     right breast   BREAST LUMPECTOMY     CATARACT EXTRACTION     bilateral   CESAREAN SECTION     COLONOSCOPY     ELBOW SURGERY     left   I & D EXTREMITY Right 08/06/2020   Procedure: IRRIGATION AND DEBRIDEMENT OF LEG, wound vac placement;  Surgeon: [name], MD;  Location: [facility];  Service: Orthopedics;  Laterality: Right;   I & D EXTREMITY Right 08/11/2020   Procedure: IRRIGATION AND DEBRIDEMENT EXTREMITY SKIN GRAFT;  Surgeon: [name], MD;  Location: [facility];  Service: Orthopedics;  Laterality: Right;   JOINT REPLACEMENT     KNEE ARTHROPLASTY Left    March 2021   MYOMECTOMY     REPLACEMENT TOTAL KNEE Left 03/2019   TOTAL KNEE ARTHROPLASTY Right 06/26/2018   Procedure: TOTAL KNEE ARTHROPLASTY;  Surgeon: Paralee Cancel, MD;  [facility];  Service: Orthopedics;  Laterality: Right;  70 mins   Social History   Occupational History   Not on file  Tobacco Use   Smoking status: Never   Smokeless tobacco: Never  Vaping Use   Vaping Use: Never used  Substance and Sexual Activity   Alcohol use: Yes    Comment: occ   Drug use: No   Sexual activity: Yes

## 2021-01-14 ENCOUNTER — Encounter: Payer: Self-pay | Admitting: Physical Therapy

## 2021-01-14 ENCOUNTER — Other Ambulatory Visit: Payer: Self-pay

## 2021-01-14 ENCOUNTER — Ambulatory Visit: Payer: Medicare Other | Attending: Internal Medicine | Admitting: Physical Therapy

## 2021-01-14 DIAGNOSIS — M6281 Muscle weakness (generalized): Secondary | ICD-10-CM | POA: Diagnosis not present

## 2021-01-14 DIAGNOSIS — R2689 Other abnormalities of gait and mobility: Secondary | ICD-10-CM

## 2021-01-14 NOTE — Patient Instructions (Signed)
Access Code: [email or web address] URL: [email or web address] Date: 01/14/2021 Prepared by: Jari Favre  Exercises Sit to Stand - 2 x daily - 7 x weekly - 2 sets - 5 reps Tandem Stance with Support - 1 x daily - 7 x weekly - 1 sets - 10 reps - 5 sec hold Standing Hip Abduction with Anterior Support - 1 x daily - 7 x weekly - 3 sets - 10 reps

## 2021-01-14 NOTE — Therapy (Signed)
[facility] @ [place], [place], [address] Phone: [phone]   Fax:  [phone]  Physical Therapy Treatment  Patient Details  Name: Yolanda Peters MRN: [id number] Date of Birth: April 21, 1940 Referring Provider (PT): Crist Infante, MD   Encounter Date: 01/14/2021   PT End of Session - 01/14/21 1408     Visit Number 1    Date for PT Re-Evaluation 03/11/21    Authorization Type UHC    PT Start Time 1400    PT Stop Time 6010    PT Time Calculation (min) 38 min    Activity Tolerance Patient tolerated treatment well    Behavior During Therapy [facility] for tasks assessed/performed             Past Medical History:  Diagnosis Date   Allergy    Arthritis    Breast cancer ([place]) 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, skin cnacer   Cancer ([place])    right breast    Hyperlipidemia    no meds taken   Hypertension    Personal history of radiation therapy 2011   rt breast    Past Surgical History:  Procedure Laterality Date   BREAST DUCTAL SYSTEM EXCISION     right breast   BREAST LUMPECTOMY     CATARACT EXTRACTION     bilateral   CESAREAN SECTION     COLONOSCOPY     ELBOW SURGERY     left   I & D EXTREMITY Right 08/06/2020   Procedure: IRRIGATION AND DEBRIDEMENT OF LEG, wound vac placement;  Surgeon: [name], MD;  Location: [facility];  Service: Orthopedics;  Laterality: Right;   I & D EXTREMITY Right 08/11/2020   Procedure: IRRIGATION AND DEBRIDEMENT EXTREMITY SKIN GRAFT;  Surgeon: [name], MD;  Location: [facility];  Service: Orthopedics;  Laterality: Right;   JOINT REPLACEMENT     KNEE ARTHROPLASTY Left    March 2021   MYOMECTOMY     REPLACEMENT TOTAL KNEE Left 03/2019   TOTAL KNEE ARTHROPLASTY Right 06/26/2018   Procedure: TOTAL KNEE ARTHROPLASTY;  Surgeon: Paralee Cancel, MD;  [facility];  Service: Orthopedics;  Laterality: Right;  70 mins    There were no vitals filed for this visit.   Subjective Assessment -  01/14/21 1402     Subjective Pt states she is coming because her daughter wants to make sure her balance is doing well.  pt also states she has bursitis in both hips but doing water aerobics helps.  Currently it is not flared up.  Occasionally rain causes joint pain but other than that no complaints.  I am very cautious in the yard because it is more uneven.    Patient Stated Goals be able to improve balance as much as possible    Currently in Pain? No/denies                [facility] PT Assessment - 01/14/21 0001       Assessment   Medical Diagnosis R26.89 (ICD-10-CM) - Other abnormalities of gait and mobility    Referring Provider (PT) Crist Infante, MD    Prior Therapy No      Precautions   Precautions None      Restrictions   Weight Bearing Restrictions No      Balance Screen   Has the patient fallen in the past 6 months No      [place]  residence    Living Arrangements Spouse/significant other      Prior Function   Level of Independence Independent      Cognition   Overall Cognitive Status Within Functional Limits for tasks assessed      Posture/Postural Control   Posture/Postural Control Postural limitations    Postural Limitations Rounded Shoulders;Forward head;Increased thoracic kyphosis;Anterior pelvic tilt;Weight shift right      ROM / Strength   AROM / PROM / Strength AROM;Strength      AROM   Overall AROM Comments lumbar flexion 50%      Strength   Overall Strength Comments hip abduction 4/5      Ambulation/Gait   Gait Pattern Decreased stance time - left;Decreased weight shift to left;Decreased stride length      Balance   Balance Assessed Yes      Standardized Balance Assessment   Standardized Balance Assessment Berg Balance Test;Five Times Sit to Stand    Five times sit to stand comments  13      Berg Balance Test   Sit to Stand Able to stand without using hands and stabilize independently    Standing  Unsupported Able to stand safely 2 minutes    Sitting with Back Unsupported but Feet Supported on Floor or Stool Able to sit safely and securely 2 minutes    Stand to Sit Sits safely with minimal use of hands    Transfers Able to transfer safely, minor use of hands    Standing Unsupported with Eyes Closed Able to stand 10 seconds safely    Standing Unsupported with Feet Together Able to place feet together independently and stand 1 minute safely    From Standing, Reach Forward with Outstretched Arm Can reach forward >12 cm safely (5")    From Standing Position, Pick up Object from Floor Able to pick up shoe safely and easily    From Standing Position, Turn to Look Behind Over each Shoulder Looks behind from both sides and weight shifts well    Turn 360 Degrees Able to turn 360 degrees safely in 4 seconds or less    Standing Unsupported, Alternately Place Feet on Step/Stool Able to stand independently and safely and complete 8 steps in 20 seconds    Standing Unsupported, One Foot in Front Able to plae foot ahead of the other independently and hold 30 seconds    Standing on One Leg Tries to lift leg/unable to hold 3 seconds but remains standing independently    Total Score 51                           [facility] Adult PT Treatment/Exercise - 01/14/21 0001       Self-Care   Self-Care Other Self-Care Comments    Other Self-Care Comments  exericses as seen in chart and added to HEP                     PT Education - 01/14/21 1513     Education Details Access Code: [email or web address]    Person(s) Educated Patient    Methods Explanation;Demonstration;Tactile cues;Verbal cues;Handout    Comprehension Verbalized understanding;Returned demonstration                 PT Long Term Goals - 01/14/21 1513       PT LONG TERM GOAL #1   Title Pt will be ind in advanced HEP and understand how to safely progress.  Time 8    Period Weeks    Status New    Target Date 03/11/21       PT LONG TERM GOAL #2   Title [name] will be 54/56 or greater    Baseline 51/56    Time 8    Period Weeks    Status New    Target Date 03/11/21      PT LONG TERM GOAL #3   Title Pt will demonstrate 5x STS in <12 seconds    Baseline 13 sec    Time 8    Period Weeks    Status New    Target Date 03/11/21      PT LONG TERM GOAL #4   Title Pt will achieve strength in bil LE (hip, knee and ankle) of at least 4+/5 to improve functional tasks such as gait endurance, stairs, transfers.    Baseline 4/5 hip abd and knee extension    Time 8    Period Weeks    Status New    Target Date 03/11/21      PT LONG TERM GOAL #5   Title Pt will be able to demonstrate safely walking on lawn >/=150' with good foot clearance and no LOB    Time 8    Period Weeks    Status New    Target Date 03/11/21                   Plan - 01/14/21 1445     Clinical Impression Statement Pt presents to clinic due to gait and balance deficits.  Pt has been experiencing some difficulties walking in the yard and when wearing sneakers that have a lot of cushion.  Pt is 51/56 on the Berg and 5x sit to stand is 13 sec.  pt has hip abduction 4/5 bil.  Pt has gait abnormalities as stated above. Pt will benefit from skilled PT to address starength and balance for improved safety and function.    Examination-Activity Limitations Caring for Others    Examination-Participation Restrictions Community Activity    Stability/Clinical Decision Making Stable/Uncomplicated    Clinical Decision Making Low    Rehab Potential Excellent    PT Frequency 2x / week   probably going to do 1x because she is motivated to do at home   PT Duration 8 weeks    PT Treatment/Interventions ADLs/Self Care Home Management;Aquatic Therapy;Cryotherapy;Electrical Stimulation;Moist Heat;Therapeutic activities;Therapeutic exercise;Neuromuscular re-education;Patient/family education;Manual techniques;Passive range of motion;Dry  needling;Taping;Gait training;Balance training    PT Next Visit Plan hip and core strength and progress balance; gait and maybe assess 6 MWT    PT Home Exercise Plan Access Code: [email or web address]    Consulted and Agree with Plan of Care Patient             Patient will benefit from skilled therapeutic intervention in order to improve the following deficits and impairments:  Decreased strength, Difficulty walking, Decreased balance, Postural dysfunction, Decreased range of motion  Visit Diagnosis: Other abnormalities of gait and mobility  Muscle weakness (generalized)     Problem List Patient Active Problem List   Diagnosis Date Noted   Ulcer of right leg, with necrosis of muscle (HCC)    Cellulitis of right leg 08/05/2020   Hypokalemia 06/27/2018   Overweight (BMI 25.0-29.9) 06/27/2018   S/P right TKA 06/26/2018   Autoimmune disease ([place]) 03/15/2016   High risk medication use 03/15/2016   Pain in joint of right shoulder 03/15/2016   Bilateral hip pain 03/15/2016  Primary osteoarthritis of both knees 03/15/2016   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

## 2021-01-21 ENCOUNTER — Encounter: Payer: Self-pay | Admitting: Physical Therapy

## 2021-01-21 ENCOUNTER — Ambulatory Visit: Payer: Medicare Other | Admitting: Physical Therapy

## 2021-01-21 ENCOUNTER — Other Ambulatory Visit: Payer: Self-pay

## 2021-01-21 DIAGNOSIS — M6281 Muscle weakness (generalized): Secondary | ICD-10-CM

## 2021-01-21 DIAGNOSIS — R2689 Other abnormalities of gait and mobility: Secondary | ICD-10-CM

## 2021-01-21 NOTE — Therapy (Signed)
[facility] @ [address] [place], [place], [address] Phone: [phone]   Fax:  [phone]  Physical Therapy Treatment  Patient Details  Name: Yolanda Peters MRN: [id number] Date of Birth: Sep 08, 1940 Referring Provider (PT): Crist Infante, MD   Encounter Date: 01/21/2021   PT End of Session - 01/21/21 1404     Visit Number 2    Date for PT Re-Evaluation 03/11/21    Authorization Type UHC    PT Start Time 1400    PT Stop Time 4481    PT Time Calculation (min) 45 min    Activity Tolerance Patient tolerated treatment well    Behavior During Therapy [facility] for tasks assessed/performed             Past Medical History:  Diagnosis Date   Allergy    Arthritis    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, skin cnacer   Cancer ([place])    right breast    Hyperlipidemia    no meds taken   Hypertension    Personal history of radiation therapy 2011   rt breast    Past Surgical History:  Procedure Laterality Date   BREAST DUCTAL SYSTEM EXCISION     right breast   BREAST LUMPECTOMY     CATARACT EXTRACTION     bilateral   CESAREAN SECTION     COLONOSCOPY     ELBOW SURGERY     left   I & D EXTREMITY Right 08/06/2020   Procedure: IRRIGATION AND DEBRIDEMENT OF LEG, wound vac placement;  Surgeon: [name], MD;  Location: [facility];  Service: Orthopedics;  Laterality: Right;   I & D EXTREMITY Right 08/11/2020   Procedure: IRRIGATION AND DEBRIDEMENT EXTREMITY SKIN GRAFT;  Surgeon: [name], MD;  Location: [facility];  Service: Orthopedics;  Laterality: Right;   JOINT REPLACEMENT     KNEE ARTHROPLASTY Left    March 2021   MYOMECTOMY     REPLACEMENT TOTAL KNEE Left 03/2019   TOTAL KNEE ARTHROPLASTY Right 06/26/2018   Procedure: TOTAL KNEE ARTHROPLASTY;  Surgeon: Paralee Cancel, MD;  [facility];  Service: Orthopedics;  Laterality: Right;  70 mins    There were no vitals filed for this visit.   Subjective Assessment -  01/21/21 1403     Subjective I don't think I need all this.  I did well on my balance tests.    Patient Stated Goals be able to improve balance as much as possible    Currently in Pain? No/denies                [facility] PT Assessment - 01/21/21 0001       Standardized Balance Assessment   Standardized Balance Assessment Dynamic Gait Index      Dynamic Gait Index   Level Surface Normal    Change in Gait Speed Normal    Gait with Horizontal Head Turns Mild Impairment    Gait with Vertical Head Turns Normal    Gait and Pivot Turn Normal    Step Over Obstacle Moderate Impairment    Step Around Obstacles Normal    Steps Normal    Total Score 21    DGI comment: 21/24                           [facility] Adult PT Treatment/Exercise - 01/21/21 0001       Exercises  Exercises Lumbar;Knee/Hip      Knee/Hip Exercises: Aerobic   Nustep L5 x 5' PT present to discuss plan for session      Knee/Hip Exercises: Standing   Hip Abduction Both;1 set;10 reps;Knee straight    Abduction Limitations counter    Hip Extension Stengthening;Both;1 set;10 reps;Knee straight    Extension Limitations counter    Lateral Step Up Both;1 set;Hand Hold: 2;Step Height: 2";15 reps      Knee/Hip Exercises: Seated   Long Arc Quad Strengthening;Both;2 sets;10 reps;Weights    Sit to [employer] 10 reps;without UE support   hold 5lb                Balance Exercises - 01/21/21 0001       Balance Exercises: Standing   Tandem Stance Eyes open;Upper extremity support 1;1 rep;20 secs    SLS Eyes open;Upper extremity support 1;1 rep;Solid surface;20 secs    Sidestepping 3 reps;Theraband    Theraband Level (Sidestepping) Level 3 (Green)   hands along wall   Step Over Hurdles / Cones fwd/lat to colored discs on floor with VC which one to step to    Marching Solid surface   toe tap tall cone with single UE support, close supervision   Lift / Chop Both;10 reps   blue plyoball lift across body  with weight shift and trunk rotation                    PT Long Term Goals - 01/14/21 1513       PT LONG TERM GOAL #1   Title Pt will be ind in advanced HEP and understand how to safely progress.    Time 8    Period Weeks    Status New    Target Date 03/11/21      PT LONG TERM GOAL #2   Title [name] will be 54/56 or greater    Baseline 51/56    Time 8    Period Weeks    Status New    Target Date 03/11/21      PT LONG TERM GOAL #3   Title Pt will demonstrate 5x STS in <12 seconds    Baseline 13 sec    Time 8    Period Weeks    Status New    Target Date 03/11/21      PT LONG TERM GOAL #4   Title Pt will achieve strength in bil LE (hip, knee and ankle) of at least 4+/5 to improve functional tasks such as gait endurance, stairs, transfers.    Baseline 4/5 hip abd and knee extension    Time 8    Period Weeks    Status New    Target Date 03/11/21      PT LONG TERM GOAL #5   Title Pt will be able to demonstrate safely walking on lawn >/=150' with good foot clearance and no LOB    Time 8    Period Weeks    Status New    Target Date 03/11/21                   Plan - 01/21/21 1449     Clinical Impression Statement Pt is challenged with higher level balance challenges requiring her to shift into single leg WB such as step overs, standing march, and SLS.  Pt participated in DGI today to tease out more dynamic balance challenges and did well scoring 21/24 with lower scores for step overs and  gait with head turns (Pt drifted slightly to Lt with Lt head rotation).  Close supervision and occassional min A for LOB with hurdle work today.  VC to avoid circumduction of Rt LE around vs over hurdle today and aim for heel strike vs whole foot placement.  Continue along POC.    PT Frequency 2x / week    PT Duration 8 weeks    PT Treatment/Interventions ADLs/Self Care Home Management;Aquatic Therapy;Cryotherapy;Electrical Stimulation;Moist Heat;Therapeutic  activities;Therapeutic exercise;Neuromuscular re-education;Patient/family education;Manual techniques;Passive range of motion;Dry needling;Taping;Gait training;Balance training    PT Next Visit Plan hip and thigh strength, hurdle step overs, SLS at counter, add foam pad for counter march, add rockerboard and rebounder    PT Home Exercise Plan Access Code: [email or web address]    Consulted and Agree with Plan of Care Patient             Patient will benefit from skilled therapeutic intervention in order to improve the following deficits and impairments:     Visit Diagnosis: Other abnormalities of gait and mobility  Muscle weakness (generalized)     Problem List Patient Active Problem List   Diagnosis Date Noted   Ulcer of right leg, with necrosis of muscle ([place])    Cellulitis of right leg 08/05/2020   Hypokalemia 06/27/2018   Overweight (BMI 25.0-29.9) 06/27/2018   S/P right TKA 06/26/2018   Autoimmune disease ([place]) 03/15/2016   High risk medication use 03/15/2016   Pain in joint of right shoulder 03/15/2016   Bilateral hip pain 03/15/2016   Primary osteoarthritis of both knees 03/15/2016   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

## 2021-01-28 ENCOUNTER — Other Ambulatory Visit: Payer: Self-pay

## 2021-01-28 ENCOUNTER — Ambulatory Visit: Payer: Medicare Other | Admitting: Physical Therapy

## 2021-01-28 ENCOUNTER — Encounter: Payer: Self-pay | Admitting: Physical Therapy

## 2021-01-28 DIAGNOSIS — M6281 Muscle weakness (generalized): Secondary | ICD-10-CM

## 2021-01-28 DIAGNOSIS — R2689 Other abnormalities of gait and mobility: Secondary | ICD-10-CM | POA: Diagnosis not present

## 2021-01-28 NOTE — Therapy (Signed)
[facility] @ [place], [place], [address] Phone: [phone]   Fax:  [phone]  Physical Therapy Treatment  Patient Details  Name: Yolanda Peters MRN: [id number] Date of Birth: 01/28/1940 Referring Provider (PT): Crist Infante, MD   Encounter Date: 01/28/2021   PT End of Session - 01/28/21 1404     Visit Number 3    Date for PT Re-Evaluation 03/11/21    Authorization Type UHC    PT Start Time 1401    PT Stop Time 3710    PT Time Calculation (min) 41 min    Activity Tolerance Patient tolerated treatment well    Behavior During Therapy [facility] for tasks assessed/performed             Past Medical History:  Diagnosis Date   Allergy    Arthritis    Breast cancer ([place]) 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, skin cnacer   Cancer ([place])    right breast    Hyperlipidemia    no meds taken   Hypertension    Personal history of radiation therapy 2011   rt breast    Past Surgical History:  Procedure Laterality Date   BREAST DUCTAL SYSTEM EXCISION     right breast   BREAST LUMPECTOMY     CATARACT EXTRACTION     bilateral   CESAREAN SECTION     COLONOSCOPY     ELBOW SURGERY     left   I & D EXTREMITY Right 08/06/2020   Procedure: IRRIGATION AND DEBRIDEMENT OF LEG, wound vac placement;  Surgeon: [name], MD;  Location: [facility];  Service: Orthopedics;  Laterality: Right;   I & D EXTREMITY Right 08/11/2020   Procedure: IRRIGATION AND DEBRIDEMENT EXTREMITY SKIN GRAFT;  Surgeon: [name], MD;  Location: [facility];  Service: Orthopedics;  Laterality: Right;   JOINT REPLACEMENT     KNEE ARTHROPLASTY Left    March 2021   MYOMECTOMY     REPLACEMENT TOTAL KNEE Left 03/2019   TOTAL KNEE ARTHROPLASTY Right 06/26/2018   Procedure: TOTAL KNEE ARTHROPLASTY;  Surgeon: Paralee Cancel, MD;  [facility];  Service: Orthopedics;  Laterality: Right;  70 mins    There were no vitals filed for this visit.   Subjective Assessment -  01/28/21 1404     Subjective I am good.  I did well after last session.    Patient Stated Goals be able to improve balance as much as possible    Currently in Pain? No/denies                               [facility] Adult PT Treatment/Exercise - 01/28/21 0001       Exercises   Exercises Lumbar;Knee/Hip      Lumbar Exercises: Stretches   ITB Stretch Left;Right;1 rep;20 seconds    ITB Stretch Limitations supine      Lumbar Exercises: Aerobic   Nustep L5 x 3', L3 x 2', seat 8 arms 10, PT present to discuss status and plan for session      Knee/Hip Exercises: Machines for Strengthening   Hip Cybex 25lb ext x 10 each, 6 reps hip abd 25lb      Knee/Hip Exercises: Standing   Other Standing Knee Exercises sidestepping along mirror x3 laps yellow loop band around knees      Knee/Hip Exercises: Seated   Long Arc Quad Strengthening;Both;2 sets;10  reps;Weights    Long [employer] Weight 3 lbs.    Sit to Sand 10 reps;without UE support   hold 5lb                Balance Exercises - 01/28/21 0001       Balance Exercises: Standing   Tandem Stance Eyes open;30 secs   in open stagger stance with weight shifting and step through high cone tap x 10 reps, bil   Step Over Hurdles / Cones fwd/lat x 5 each, x4 hurdle walk overs with step through pattern and pause to show control, difficult and needed UE support                     PT Long Term Goals - 01/14/21 1513       PT LONG TERM GOAL #1   Title Pt will be ind in advanced HEP and understand how to safely progress.    Time 8    Period Weeks    Status New    Target Date 03/11/21      PT LONG TERM GOAL #2   Title [name] will be 54/56 or greater    Baseline 51/56    Time 8    Period Weeks    Status New    Target Date 03/11/21      PT LONG TERM GOAL #3   Title Pt will demonstrate 5x STS in <12 seconds    Baseline 13 sec    Time 8    Period Weeks    Status New    Target Date 03/11/21      PT LONG  TERM GOAL #4   Title Pt will achieve strength in bil LE (hip, knee and ankle) of at least 4+/5 to improve functional tasks such as gait endurance, stairs, transfers.    Baseline 4/5 hip abd and knee extension    Time 8    Period Weeks    Status New    Target Date 03/11/21      PT LONG TERM GOAL #5   Title Pt will be able to demonstrate safely walking on lawn >/=150' with good foot clearance and no LOB    Time 8    Period Weeks    Status New    Target Date 03/11/21                   Plan - 01/28/21 1551     Clinical Impression Statement Pt with difficutly showing step through pattern over hurdles with pause to show control.  She needed UE support for forward hurdle step over and backs but is able to perform lateral step overs without UE support.  Pt needed intermittent cues with LAQ to fully engage quad and extend knee as fully as possible.  Pt with good tolerance of addition of hip cybex machine today for ext and abd.  Continue along POC with higher level balance and gait challenges.    Rehab Potential Excellent    PT Frequency 2x / week    PT Duration 8 weeks    PT Treatment/Interventions ADLs/Self Care Home Management;Aquatic Therapy;Cryotherapy;Electrical Stimulation;Moist Heat;Therapeutic activities;Therapeutic exercise;Neuromuscular re-education;Patient/family education;Manual techniques;Passive range of motion;Dry needling;Taping;Gait training;Balance training    PT Next Visit Plan hip and thigh strength, hurdle step overs, SLS at counter, add foam pad for counter march, add rockerboard and rebounder    PT Home Exercise Plan Access Code: [email or web address]    Consulted and Agree with Plan of Care  Patient             Patient will benefit from skilled therapeutic intervention in order to improve the following deficits and impairments:     Visit Diagnosis: Other abnormalities of gait and mobility  Muscle weakness (generalized)     Problem List Patient Active Problem  List   Diagnosis Date Noted   Ulcer of right leg, with necrosis of muscle (HCC)    Cellulitis of right leg 08/05/2020   Hypokalemia 06/27/2018   Overweight (BMI 25.0-29.9) 06/27/2018   S/P right TKA 06/26/2018   Autoimmune disease ([place]) 03/15/2016   High risk medication use 03/15/2016   Pain in joint of right shoulder 03/15/2016   Bilateral hip pain 03/15/2016   Primary osteoarthritis of both knees 03/15/2016   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

## 2021-02-03 ENCOUNTER — Encounter: Payer: Self-pay | Admitting: Physical Therapy

## 2021-02-03 ENCOUNTER — Other Ambulatory Visit: Payer: Self-pay

## 2021-02-03 ENCOUNTER — Ambulatory Visit: Payer: Medicare Other | Admitting: Physical Therapy

## 2021-02-03 DIAGNOSIS — M6281 Muscle weakness (generalized): Secondary | ICD-10-CM

## 2021-02-03 DIAGNOSIS — R2689 Other abnormalities of gait and mobility: Secondary | ICD-10-CM

## 2021-02-03 NOTE — Therapy (Signed)
[facility] @ [place], [place], [address] Phone: [phone]   Fax:  [phone]  Physical Therapy Treatment  Patient Details  Name: Yolanda Peters MRN: [id number] Date of Birth: 01/19/1940 Referring Provider (PT): Yolanda Infante, MD   Encounter Date: 02/03/2021   PT End of Session - 02/03/21 1235     Visit Number 4    Date for PT Re-Evaluation 03/11/21    Authorization Type UHC    PT Start Time 1232    PT Stop Time 3710    PT Time Calculation (min) 31 min    Activity Tolerance Patient tolerated treatment well    Behavior During Therapy [facility] for tasks assessed/performed             Past Medical History:  Diagnosis Date   Allergy    Arthritis    Breast cancer ([place]) 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, skin cnacer   Cancer ([place])    right breast    Hyperlipidemia    no meds taken   Hypertension    Personal history of radiation therapy 2011   rt breast    Past Surgical History:  Procedure Laterality Date   BREAST DUCTAL SYSTEM EXCISION     right breast   BREAST LUMPECTOMY     CATARACT EXTRACTION     bilateral   CESAREAN SECTION     COLONOSCOPY     ELBOW SURGERY     left   I & D EXTREMITY Right 08/06/2020   Procedure: IRRIGATION AND DEBRIDEMENT OF LEG, wound vac placement;  Surgeon: Yolanda Minion, MD;  Location: [facility];  Service: Orthopedics;  Laterality: Right;   I & D EXTREMITY Right 08/11/2020   Procedure: IRRIGATION AND DEBRIDEMENT EXTREMITY SKIN GRAFT;  Surgeon: Yolanda Minion, MD;  Location: [facility];  Service: Orthopedics;  Laterality: Right;   JOINT REPLACEMENT     KNEE ARTHROPLASTY Left    March 2021   MYOMECTOMY     REPLACEMENT TOTAL KNEE Left 03/2019   TOTAL KNEE ARTHROPLASTY Right 06/26/2018   Procedure: TOTAL KNEE ARTHROPLASTY;  Surgeon: Yolanda Cancel, MD;  Location: [facility];  Service: Orthopedics;  Laterality: Right;  70 mins    There were no vitals filed for this visit.   Subjective Assessment -  02/03/21 1234     Subjective I need to make today my last day due to demands of caring for my husband right now.    Patient Stated Goals be able to improve balance as much as possible    Currently in Pain? No/denies                [facility] PT Assessment - 02/03/21 0001       Assessment   Medical Diagnosis R26.89 (ICD-10-CM) - Other abnormalities of gait and mobility    Referring Provider (PT) Yolanda Infante, MD    Prior Therapy No      Strength   Overall Strength Comments hip abduction 4+/5      Standardized Balance Assessment   Standardized Balance Assessment Dynamic Gait Index;Berg Balance Test;Five Times Sit to Stand    Five times sit to stand comments  11      Berg Balance Test   Sit to Stand Able to stand without using hands and stabilize independently    Standing Unsupported Able to stand safely 2 minutes    Sitting with Back Unsupported but Feet Supported on Floor or Stool Able to sit safely and  securely 2 minutes    Stand to Sit Sits safely with minimal use of hands    Transfers Able to transfer safely, minor use of hands    Standing Unsupported with Eyes Closed Able to stand 10 seconds safely    Standing Unsupported with Feet Together Able to place feet together independently and stand 1 minute safely    From Standing, Reach Forward with Outstretched Arm Can reach confidently >25 cm (10")    From Standing Position, Pick up Object from Floor Able to pick up shoe safely and easily    From Standing Position, Turn to Look Behind Over each Shoulder Looks behind from both sides and weight shifts well    Turn 360 Degrees Able to turn 360 degrees safely in 4 seconds or less    Standing Unsupported, Alternately Place Feet on Step/Stool Able to stand independently and safely and complete 8 steps in 20 seconds    Standing Unsupported, One Foot in Front Able to plae foot ahead of the other independently and hold 30 seconds    Standing on One Leg Able to lift leg independently and  hold 5-10 seconds    Total Score 54      Dynamic Gait Index   Level Surface Normal    Change in Gait Speed Normal    Gait with Horizontal Head Turns Normal    Gait with Vertical Head Turns Normal    Gait and Pivot Turn Normal    Step Over Obstacle Mild Impairment    Step Around Obstacles Normal    Steps Normal    Total Score 23    DGI comment: 23/24                           [facility] Adult PT Treatment/Exercise - 02/03/21 0001       Exercises   Exercises Lumbar;Knee/Hip      Lumbar Exercises: Aerobic   Nustep L5 x 3', L3 x 2', seat 8 arms 10, PT present to discuss status and plan for session      Lumbar Exercises: Seated   Long Arc Quad on Chair Strengthening;Both;2 sets;10 reps;Weights    LAQ on Chair Weights (lbs) 3    Sit to Stand 10 reps    Sit to Stand Limitations hold 7lb      Knee/Hip Exercises: Machines for Strengthening   Hip Cybex 25lb abd 1 x10 Rt/Lt, 1x20 hip ext Rt/Lt                 Balance Exercises - 02/03/21 0001       Balance Exercises: Standing   SLS Eyes open;Upper extremity support 1;Other reps (comment);10 secs    Gait with Head Turns Forward;3 reps   close supervision   Step Over Hurdles / Cones fwd/lat x 5 each, x4 hurdle walk overs with step through pattern and pause to show control,                  PT Short Term Goals - 02/03/21 1316       PT SHORT TERM GOAL #1   Title -      PT SHORT TERM GOAL #2   Title -      PT SHORT TERM GOAL #3   Title -      PT SHORT TERM GOAL #4   Title -      PT SHORT TERM GOAL #5   Title -  PT Long Term Goals - 02/03/21 1315       PT LONG TERM GOAL #1   Title Pt will be ind in advanced HEP and understand how to safely progress.    Status Achieved      PT LONG TERM GOAL #2   Title Yolanda Peters will be 54/56 or greater    Baseline 54/56    Status Achieved      PT LONG TERM GOAL #3   Title Pt will demonstrate 5x STS in <12 seconds    Baseline 11 sec     Status Achieved      PT LONG TERM GOAL #4   Title Pt will achieve strength in bil LE (hip, knee and ankle) of at least 4+/5 to improve functional tasks such as gait endurance, stairs, transfers.    Baseline -    Status Achieved      PT LONG TERM GOAL #5   Title Pt will be able to demonstrate safely walking on lawn >/=150' with good foot clearance and no LOB    Baseline unable to test on last day due to weather    Status Deferred                   Plan - 02/03/21 1312     Clinical Impression Statement Pt arrived with report of this being her last day, needing to discharge due to her husband's health.  She has improved her DGI to 23/24 showing improvements in gait with headturns (no more deviation to follow gaze) and with hurdle step overs ant/post and lateral with Rt/Lt LEs.  She feels confident in her balance and has met all goals.  Hip strength is 4+/5 bil.  D/C with understanding she may follow back up with a new referral if she needs more skilled intervention to assist with her balance.             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   Autoimmune disease ([place]) 03/15/2016   High risk medication use 03/15/2016   Pain in joint of right shoulder 03/15/2016   Bilateral hip pain 03/15/2016   Primary osteoarthritis of both knees 03/15/2016   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  She has actually met all goals and was ready to d/c at this time.     Remaining deficits: See above   Education / Equipment: HEP  Patient agrees to discharge. Patient goals were met. Patient is being discharged due to being pleased with the current functional level.  Baruch Merl, PT 02/03/21 1:19 PM  [facility] @ [place], [place], [address] Phone: [phone]  Fax:  [phone]  Name: Yolanda Peters MRN: [id number] Date of Birth: [birth year]

## 2021-02-11 ENCOUNTER — Encounter: Payer: Medicare Other | Admitting: Physical Therapy

## 2021-02-18 ENCOUNTER — Encounter: Payer: Medicare Other | Admitting: Physical Therapy

## 2021-09-17 ENCOUNTER — Ambulatory Visit: Payer: Medicare Other | Admitting: Rheumatology

## 2021-09-17 NOTE — Progress Notes (Signed)
Office Visit Note  Patient: Yolanda Peters             Date of Birth: 1940/07/30           MRN: [id number]             PCP: Shon Baton, MD Referring: Shon Baton, MD Visit Date: 10/01/2021 Occupation: '@GUAROCC'$ @  Subjective:  Pain in joints  History of Present Illness: Yolanda Peters is a 81 y.o. female with history of osteoarthritis and autoimmune disease.  Patient states that she lost her husband in March 2023.  She has been going through the grieving process.  She continues to have some pain and discomfort in her bilateral hands.  She has difficulty gripping objects and opening bottles.  She continues to have some discomfort in her knee joints off-and-on.  Her bilateral knee joints are replaced.  She has discomfort in her feet which is manageable.  She denies any history of oral ulcers, nasal ulcers, malar rash, photosensitivity, Raynaud's phenomenon, sicca symptoms or lymphadenopathy.  She denies any history of inflammatory arthritis.  Activities of Daily Living:  Patient reports morning stiffness for 0 minutes.   Patient Denies nocturnal pain.  Difficulty dressing/grooming: Denies Difficulty climbing stairs: Denies Difficulty getting out of chair: Denies Difficulty using hands for taps, buttons, cutlery, and/or writing: Denies  Review of Systems  Constitutional:  Negative for fatigue.  HENT:  Negative for mouth sores and mouth dryness.   Eyes:  Negative for dryness.  Respiratory:  Negative for shortness of breath.   Cardiovascular:  Negative for chest pain and palpitations.  Gastrointestinal:  Negative for blood in stool, constipation and diarrhea.  Endocrine: Negative for increased urination.  Genitourinary:  Negative for involuntary urination.  Musculoskeletal:  Positive for joint pain and joint pain. Negative for gait problem, joint swelling, myalgias, muscle weakness, morning stiffness, muscle tenderness and myalgias.  Skin:  Negative for color change, rash, hair loss and  sensitivity to sunlight.  Allergic/Immunologic: Negative for susceptible to infections.  Neurological:  Negative for dizziness and headaches.  Hematological:  Negative for swollen glands.  Psychiatric/Behavioral:  Negative for depressed mood and sleep disturbance. The patient is not nervous/anxious.     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   RA (rheumatoid arthritis) ([place]) 02/24/2014   HTN (hypertension) 02/24/2014   Hyperlipidemia 02/24/2014   Breast cancer, right, upper outer quadrant  07/05/2010    Past Medical History:  Diagnosis Date   Allergy    Arthritis    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, skin cnacer   Cancer ([place])    right breast    Hyperlipidemia    no meds taken   Hypertension    Personal history of radiation therapy 2011   rt breast    Family History  Problem Relation Age of Onset   Cancer Mother        bladder   Cancer Father        leukemia   Cancer Maternal Aunt  breast   Colon cancer Paternal Aunt        pt is unsure of this   Colon cancer Cousin        pt is unsure of this   Esophageal cancer Neg Hx    Rectal cancer Neg Hx    Stomach cancer Neg Hx    Past Surgical History:  Procedure Laterality Date   BREAST DUCTAL SYSTEM EXCISION     right breast   BREAST LUMPECTOMY     CATARACT EXTRACTION     bilateral   CESAREAN SECTION     COLONOSCOPY     ELBOW SURGERY     left   I & D EXTREMITY Right 08/06/2020   Procedure: IRRIGATION AND DEBRIDEMENT OF LEG, wound vac placement;   Surgeon: [name], MD;  Location: [facility];  Service: Orthopedics;  Laterality: Right;   I & D EXTREMITY Right 08/11/2020   Procedure: IRRIGATION AND DEBRIDEMENT EXTREMITY SKIN GRAFT;  Surgeon: [name], MD;  Location: [facility];  Service: Orthopedics;  Laterality: Right;   JOINT REPLACEMENT     KNEE ARTHROPLASTY Left    March 2021   MYOMECTOMY     REPLACEMENT TOTAL KNEE Left 03/2019   TOTAL KNEE ARTHROPLASTY Right 06/26/2018   Procedure: TOTAL KNEE ARTHROPLASTY;  Surgeon: Paralee Cancel, MD;  Location: [facility];  Service: Orthopedics;  Laterality: Right;  70 mins   Social History   Social History Narrative   ** Merged History Encounter **       Immunization History  Administered Date(s) Administered   Influenza Split 09/24/2013, 09/24/2014   PFIZER(Purple Top)SARS-COV-2 Vaccination 01/22/2019, 02/11/2019   Pfizer Covid-19 Vaccine Bivalent Booster 84yr & up 10/07/2020   Tdap 08/28/2014   Zoster Recombinat (Shingrix) 10/12/2017     Objective: Vital Signs: BP 136/88 (BP Location: Right Arm, Patient Position: Sitting, Cuff Size: Normal)   Pulse 92   Resp 15   Ht '5\' 4"'$  (1.626 m)   Wt 164 lb 9.6 oz (74.7 kg)   BMI 28.25 kg/m    Physical Exam Vitals and nursing note reviewed.  Constitutional:      Appearance: She is well-developed.  HENT:     Head: Normocephalic and atraumatic.  Eyes:     Conjunctiva/sclera: Conjunctivae normal.  Cardiovascular:     Rate and Rhythm: Normal rate and regular rhythm.     Heart sounds: Normal heart sounds.  Pulmonary:     Effort: Pulmonary effort is normal.     Breath sounds: Normal breath sounds.  Abdominal:     General: Bowel sounds are normal.     Palpations: Abdomen is soft.  Musculoskeletal:     Cervical back: Normal range of motion.  Lymphadenopathy:     Cervical: No cervical adenopathy.  Skin:    General: Skin is warm and dry.     Capillary Refill: Capillary refill takes less than 2 seconds.  Neurological:     Mental Status:  She is alert and oriented to person, place, and time.  Psychiatric:        Behavior: Behavior normal.      Musculoskeletal Exam: She had limited lateral rotation of the cervical spine and limited flexion and extension.  Thoracic kyphosis was noted.  Shoulder joints, elbow joints, wrist joints with good range of motion.  She had no synovitis over wrist joints or MCP joints.  She has severe PIP and DIP thickening with subluxation of most of her PIP and DIP joints.  No synovitis  was noted.  Hip joints were in good range of motion.  Bilateral knee joints were replaced and were in good range of motion.  She had bilateral pedal edema.  There was no tenderness over ankles or MTPs.  CDAI Exam: CDAI Score: -- Patient Global: --; Provider Global: -- Swollen: --; Tender: -- Joint Exam 10/01/2021   No joint exam has been documented for this visit   There is currently no information documented on the homunculus. Go to the Rheumatology activity and complete the homunculus joint exam.  Investigation: No additional findings.  Imaging: No results found.  Recent Labs: Lab Results  Component Value Date   WBC 6.7 09/11/2020   HGB 12.6 09/11/2020   PLT 367 09/11/2020   NA 141 09/11/2020   K 4.0 09/11/2020   CL 105 09/11/2020   CO2 26 09/11/2020   GLUCOSE 97 09/11/2020   BUN 21 09/11/2020   CREATININE 0.79 09/11/2020   BILITOT 0.6 09/11/2020   ALKPHOS 87 08/05/2020   AST 15 09/11/2020   ALT 12 09/11/2020   PROT 6.3 09/11/2020   ALBUMIN 4.0 08/05/2020   CALCIUM 9.2 09/11/2020   GFRAA >60 06/27/2018    Speciality Comments: [facility]  Procedures:  No procedures performed Allergies: Penicillins and Tetracyclines & related   Assessment / Plan:     Visit Diagnoses: Primary osteoarthritis of both hands-she has severe osteoarthritis involving bilateral hands with PIP and DIP subluxation.  She had PIP and DIP thickening.  Joint  protection muscle strengthening was discussed.  Status post total bilateral knee replacement-her bilateral knee joints are replaced.  She had no discomfort range of motion of her knee joints.  No warmth swelling or effusion was noted.  Primary osteoarthritis of both feet-she had no tenderness over MTPs or PIPs.  She had bilateral pedal edema.  Autoimmune disease ([place]) - in remssion -she denies any history of oral ulcers, nasal ulcers, sicca symptoms, malar rash, photosensitivity, Raynaud's phenomenon or lymphadenopathy.  There is no history of inflammatory arthritis.  I will obtain labs today.  Plan: CBC with Differential/Platelet, COMPLETE METABOLIC PANEL WITH GFR, Anti-DNA antibody, double-stranded, ANA, C3 and C4, Sedimentation rate, Protein / creatinine ratio, urine  History of hyperlipidemia  History of breast cancer  History of hypertension-blood pressure was within normal limits.  She is on verapamil.  Osteoporosis screening-patient states that she gets DEXA scan through her GYN.  I advised her to schedule a repeat DEXA scan.    Orders: Orders Placed This Encounter  Procedures   CBC with Differential/Platelet   COMPLETE METABOLIC PANEL WITH GFR   Anti-DNA antibody, double-stranded   ANA   C3 and C4   Sedimentation rate   Protein / creatinine ratio, urine   No orders of the defined types were placed in this encounter.    Follow-Up Instructions: Return in about 1 year (around 10/02/2022) for Osteoarthritis, Autoimmune disease.   Bo Merino, MD  Note - This record has been created using Editor, commissioning.  Chart creation errors have been sought, but may not always  have been located. Such creation errors do not reflect on  the standard of medical care.

## 2021-09-28 ENCOUNTER — Encounter: Payer: Self-pay | Admitting: Physical Therapy

## 2021-10-01 ENCOUNTER — Ambulatory Visit: Payer: Medicare Other | Attending: Rheumatology | Admitting: Rheumatology

## 2021-10-01 ENCOUNTER — Encounter: Payer: Self-pay | Admitting: Rheumatology

## 2021-10-01 VITALS — BP 136/88 | HR 92 | Resp 15 | Ht 64.0 in | Wt 164.6 lb

## 2021-10-01 DIAGNOSIS — Z8639 Personal history of other endocrine, nutritional and metabolic disease: Secondary | ICD-10-CM

## 2021-10-01 DIAGNOSIS — M359 Systemic involvement of connective tissue, unspecified: Secondary | ICD-10-CM

## 2021-10-01 DIAGNOSIS — Z853 Personal history of malignant neoplasm of breast: Secondary | ICD-10-CM

## 2021-10-01 DIAGNOSIS — M19042 Primary osteoarthritis, left hand: Secondary | ICD-10-CM

## 2021-10-01 DIAGNOSIS — Z8679 Personal history of other diseases of the circulatory system: Secondary | ICD-10-CM

## 2021-10-01 DIAGNOSIS — Z96653 Presence of artificial knee joint, bilateral: Secondary | ICD-10-CM | POA: Diagnosis not present

## 2021-10-01 DIAGNOSIS — M19041 Primary osteoarthritis, right hand: Secondary | ICD-10-CM | POA: Diagnosis not present

## 2021-10-01 DIAGNOSIS — G5703 Lesion of sciatic nerve, bilateral lower limbs: Secondary | ICD-10-CM

## 2021-10-01 DIAGNOSIS — M19071 Primary osteoarthritis, right ankle and foot: Secondary | ICD-10-CM

## 2021-10-01 DIAGNOSIS — Z13820 Encounter for screening for osteoporosis: Secondary | ICD-10-CM

## 2021-10-01 DIAGNOSIS — M19072 Primary osteoarthritis, left ankle and foot: Secondary | ICD-10-CM

## 2021-10-04 LAB — CBC WITH DIFFERENTIAL/PLATELET
Absolute Monocytes: 611 cells/uL (ref 200–950)
Basophils Absolute: 52 cells/uL (ref 0–200)
Basophils Relative: 0.8 %
Eosinophils Absolute: 189 cells/uL (ref 15–500)
Eosinophils Relative: 2.9 %
HCT: 44.2 % (ref 35.0–45.0)
Hemoglobin: 14.7 g/dL (ref 11.7–15.5)
Lymphs Abs: 1612 cells/uL (ref 850–3900)
MCH: 28.6 pg (ref 27.0–33.0)
MCHC: 33.3 g/dL (ref 32.0–36.0)
MCV: 86 fL (ref 80.0–100.0)
MPV: 9.7 fL (ref 7.5–12.5)
Monocytes Relative: 9.4 %
Neutro Abs: 4037 cells/uL (ref 1500–7800)
Neutrophils Relative %: 62.1 %
Platelets: 275 10*3/uL (ref 140–400)
RBC: 5.14 10*6/uL — ABNORMAL HIGH (ref 3.80–5.10)
RDW: 14.1 % (ref 11.0–15.0)
Total Lymphocyte: 24.8 %
WBC: 6.5 10*3/uL (ref 3.8–10.8)

## 2021-10-04 LAB — COMPLETE METABOLIC PANEL WITH GFR
AG Ratio: 1.9 (calc) (ref 1.0–2.5)
ALT: 22 U/L (ref 6–29)
AST: 20 U/L (ref 10–35)
Albumin: 4.3 g/dL (ref 3.6–5.1)
Alkaline phosphatase (APISO): 91 U/L (ref 37–153)
BUN: 16 mg/dL (ref 7–25)
CO2: 29 mmol/L (ref 20–32)
Calcium: 9.1 mg/dL (ref 8.6–10.4)
Chloride: 102 mmol/L (ref 98–110)
Creat: 0.67 mg/dL (ref 0.60–0.95)
Globulin: 2.3 g/dL (calc) (ref 1.9–3.7)
Glucose, Bld: 96 mg/dL (ref 65–99)
Potassium: 3.9 mmol/L (ref 3.5–5.3)
Sodium: 141 mmol/L (ref 135–146)
Total Bilirubin: 0.9 mg/dL (ref 0.2–1.2)
Total Protein: 6.6 g/dL (ref 6.1–8.1)
eGFR: 88 mL/min/{1.73_m2} (ref >=60)

## 2021-10-04 LAB — PROTEIN / CREATININE RATIO, URINE
Creatinine, Urine: 144 mg/dL (ref 20–275)
Protein/Creat Ratio: 264 mg/g creat — ABNORMAL HIGH (ref 24–184)
Protein/Creatinine Ratio: 0.264 mg/mg creat — ABNORMAL HIGH (ref 0.024–0.184)
Total Protein, Urine: 38 mg/dL — ABNORMAL HIGH (ref 5–24)

## 2021-10-04 LAB — C3 AND C4
C3 Complement: 156 mg/dL (ref 83–193)
C4 Complement: 33 mg/dL (ref 15–57)

## 2021-10-04 LAB — ANTI-NUCLEAR AB-TITER (ANA TITER)
ANA TITER: 1:320 {titer} — ABNORMAL HIGH
ANA Titer 1: 1:320 {titer} — ABNORMAL HIGH

## 2021-10-04 LAB — ANA: Anti Nuclear Antibody (ANA): POSITIVE — AB

## 2021-10-04 LAB — SEDIMENTATION RATE: Sed Rate: 2 mm/h (ref 0–30)

## 2021-10-04 LAB — ANTI-DNA ANTIBODY, DOUBLE-STRANDED: ds DNA Ab: <1 IU/mL

## 2021-10-05 NOTE — Progress Notes (Signed)
Sed rate normal, complements normal, double-stranded DNA negative, ANA positive, CMP normal, CBC normal, urine protein mildly elevated.  Labs do not indicate a lupus flare.  Patient should discuss proteinuria with her PCP.

## 2021-10-11 ENCOUNTER — Other Ambulatory Visit: Payer: Self-pay | Admitting: Internal Medicine

## 2021-10-11 DIAGNOSIS — Z1231 Encounter for screening mammogram for malignant neoplasm of breast: Secondary | ICD-10-CM

## 2021-11-15 ENCOUNTER — Ambulatory Visit
Admission: RE | Admit: 2021-11-15 | Discharge: 2021-11-15 | Disposition: A | Discharge status: Home / Self Care | Payer: Medicare Other | Source: Ambulatory Visit | Attending: Internal Medicine | Admitting: Internal Medicine

## 2021-11-15 DIAGNOSIS — Z1231 Encounter for screening mammogram for malignant neoplasm of breast: Secondary | ICD-10-CM

## 2021-11-16 ENCOUNTER — Telehealth: Payer: Self-pay | Admitting: *Deleted

## 2021-11-16 ENCOUNTER — Encounter: Payer: Self-pay | Admitting: *Deleted

## 2021-11-16 NOTE — Telephone Encounter (Signed)
Received DEXA results from [facility].  Date of Scan: 10/25/2019  Lowest T-score:-1.5  BMD:0.678  Lowest site measured:Left Femoral Neck  DX: Osteopenia   Significant changes in BMD and site measured (5% and above):n/a  Current Regimen: Calcium, Vitamin D  Recommendation:Calcium 1200 mg daily (Diet and supplement) Exercise, Repeat in 2 years.   Reviewed by: Dr. Bo Merino  Next Appointment:  09/16/2022

## 2022-02-24 ENCOUNTER — Other Ambulatory Visit: Payer: Self-pay

## 2022-02-24 ENCOUNTER — Ambulatory Visit: Payer: Medicare Other | Attending: Urology

## 2022-02-24 DIAGNOSIS — M6281 Muscle weakness (generalized): Secondary | ICD-10-CM

## 2022-02-24 DIAGNOSIS — N3942 Incontinence without sensory awareness: Secondary | ICD-10-CM

## 2022-02-24 DIAGNOSIS — R293 Abnormal posture: Secondary | ICD-10-CM

## 2022-02-24 DIAGNOSIS — N393 Stress incontinence (female) (male): Secondary | ICD-10-CM | POA: Diagnosis present

## 2022-02-24 DIAGNOSIS — R279 Unspecified lack of coordination: Secondary | ICD-10-CM

## 2022-02-24 NOTE — Therapy (Signed)
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, skin cnacer   Cancer ([place])    right breast    Hyperlipidemia    no meds taken   Hypertension    Personal history of radiation therapy 2011   rt breast   Past Surgical History:  Procedure Laterality Date   BREAST DUCTAL SYSTEM EXCISION     right breast   BREAST LUMPECTOMY     CATARACT EXTRACTION     bilateral   CESAREAN SECTION     COLONOSCOPY     ELBOW SURGERY     left   I & D EXTREMITY Right 08/06/2020   Procedure: IRRIGATION AND DEBRIDEMENT OF LEG, wound vac placement;  Surgeon: [name], MD;  Location: [facility];  Service: Orthopedics;  Laterality: Right;   I & D EXTREMITY Right 08/11/2020   Procedure: IRRIGATION AND DEBRIDEMENT EXTREMITY SKIN GRAFT;  Surgeon: [name], MD;  Location: [facility];  Service: Orthopedics;  Laterality: Right;   JOINT REPLACEMENT     KNEE ARTHROPLASTY Left    March 2021   MYOMECTOMY     REPLACEMENT TOTAL KNEE Left 03/2019   TOTAL KNEE ARTHROPLASTY Right 06/26/2018   Procedure: TOTAL KNEE ARTHROPLASTY;  Surgeon: Paralee Cancel, MD;  Location: [facility];  Service: Orthopedics;  Laterality: Right;  70 mins   Patient Active Problem List   Diagnosis Date Noted   Ulcer of right leg, with necrosis of muscle (HCC)    Cellulitis of right leg 08/05/2020   Hypokalemia 06/27/2018   Overweight (BMI 25.0-29.9) 06/27/2018   S/P right TKA 06/26/2018   Autoimmune disease ([place]) 03/15/2016   High risk medication use 03/15/2016   Pain in joint of right  shoulder 03/15/2016   Bilateral hip pain 03/15/2016   Primary osteoarthritis of both knees 03/15/2016   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   HTN (hypertension) 02/24/2014   Hyperlipidemia 02/24/2014   Breast cancer, right, upper outer quadrant  07/05/2010    PCP: Shon Baton, MD  REFERRING PROVIDER: Bjorn Loser, MD  REFERRING DIAG: R35.0 (ICD-10-CM) - Frequency of micturition N39.3 (ICD-10-CM) - Stress incontinence (female) (female)  THERAPY DIAG:  Urinary incontinence without sensory awareness  Muscle weakness (generalized)  Unspecified lack of coordination  Abnormal posture  SUI (stress urinary incontinence, female)  Rationale for Evaluation and Treatment: Rehabilitation  ONSET DATE: several years ago  SUBJECTIVE:  02/24/22 SUBJECTIVE STATEMENT: Pt states that she will go to the bathroom in her pad during the day without awareness.  Fluid intake: Yes: states not enough water, tea, occasional wine    PAIN:  Are you having pain? No   PRECAUTIONS: None  WEIGHT BEARING RESTRICTIONS: No  FALLS:  Has patient fallen in last 6 months? No  LIVING ENVIRONMENT: Lives with: lives with their family Lives in: House/apartment   OCCUPATION: retired  PLOF: Independent  PATIENT GOALS: she is taking a trip in May and June - she would like to not have to worry about incontinence  PERTINENT HISTORY:  C-section Sexual abuse: No  BOWEL MOVEMENT: Pain with bowel movement: No Type of bowel movement:Frequency 1x/day and Strain No Fully empty rectum: Yes: - Leakage: No Pads: No Fiber supplement: No  URINATION: Pain with urination: No Fully empty bladder: Yes: - Stream: Strong Urgency: No Frequency: every 3 hours during the day; 2-6x/night depending on what she has had to  drink  Leakage: Coughing, Sneezing, Laughing, Exercise, Lifting, Bending forward, and without sensation Pads: Yes: a couple pads  INTERCOURSE: Not sexually active, no history of pain  PREGNANCY: Vaginal deliveries 0 C-section deliveries 1 Currently pregnant No  PROLAPSE: None   OBJECTIVE:  02/24/22:  COGNITION: Overall cognitive status: Within functional limits for tasks assessed     SENSATION: Light touch: Appears intact Proprioception: Appears intact   PALPATION:   General  no tenderness in abdomen or urgency with palpation of bladder                 External Perineal Exam WNL                             Internal Pelvic Floor no tenderness, WNL  Patient confirms identification and approves PT to assess internal pelvic floor and treatment Yes  PELVIC MMT:   MMT eval  Vaginal 1/5, improved to 2/5 with training, 2 second endurance, poor coordination of active contraction  Internal Anal Sphincter   External Anal Sphincter   Puborectalis   Diastasis Recti 2 finger width at umbilicus with distortion  (Blank rows = not tested)        TONE: WNL  PROLAPSE: None detected in supine this session  TODAY'S TREATMENT:                                                                                                                              DATE: 02/24/22  EVAL  Neuromuscular re-education: Pt provides verbal consent for internal vaginal/rectal pelvic floor exam. Internal vaginal pelvic floor contraction training with breath coordination Therapeutic activities: Double voiding Strict bladder retraining    PATIENT EDUCATION:  Education details: see above Person educated: Patient Education method: Explanation, Demonstration, Tactile cues, Verbal cues, and Handouts Education comprehension: verbalized understanding  HOME EXERCISE PROGRAM: GHPPAAWH  ASSESSMENT:  CLINICAL IMPRESSION: Patient is a 82 y.o. female who was  seen today for physical therapy evaluation  and treatment for urinary incontinence. Exam findings notable for poor pelvic floor coordination of voluntary contraction, pelvic floor weakness, decreased pelvic floor endurance, and diastasis recti with distortion upon increased abdominal pressure. Signs and symptoms are most consistent with poor bladder habits and pelvic floor weakness/decreased coordination. Initial treatment consisted of pelvic floor contraction training and strict bladder retraining program; believe that she will make good progress quickly based upon response to treatment session today. She will continue to benefit from skilled PT intervention in order to decrease urinary incontinence, improve bladder habits, and improve QOL preparing her for two big trips this year.   OBJECTIVE IMPAIRMENTS: decreased coordination, decreased endurance, decreased strength, increased fascial restrictions, increased muscle spasms, and pain.   ACTIVITY LIMITATIONS: lifting, bending, and continence  PARTICIPATION LIMITATIONS: community activity  PERSONAL FACTORS: Behavior pattern are also affecting patient's functional outcome.   REHAB POTENTIAL: Good  CLINICAL DECISION MAKING: Stable/uncomplicated  EVALUATION COMPLEXITY: Low   GOALS: Goals reviewed with patient? Yes  SHORT TERM GOALS: Target date: 04/07/22  Pt will be independent with HEP.   Baseline: Goal status: INITIAL  2.  Pt will be independent with the knack, urge suppression technique, and double voiding in order to improve bladder habits and decrease urinary incontinence.   Baseline:  Goal status: INITIAL   LONG TERM GOALS: Target date: 05/19/2022  Pt will be independent with advanced HEP.   Baseline:  Goal status: INITIAL  2.  Pt will demonstrate normal pelvic floor muscle tone and A/ROM, able to achieve 4/5 strength with contractions and 10 sec endurance, in order to provide appropriate lumbopelvic support in functional activities.   Baseline:  Goal status:  INITIAL  3.  Pt will report no episodes of urinary incontinence wihtout sensation.  Baseline:  Goal status: INITIAL  4.  Pt will report no leaks with laughing, coughing, sneezing in order to improve comfort with interpersonal relationships and community activities.   Baseline:  Goal status: INITIAL  5.  Pt will feel confident without using pads daily.  Baseline:  Goal status: INITIAL  6.  Pt will decrease frequency of nightly trips to the bathroom to 1 or less in order to get restful sleep.   Baseline:  Goal status: INITIAL  PLAN:  PT FREQUENCY: 1-2x/week  PT DURATION: 10 weeks  PLANNED INTERVENTIONS: Therapeutic exercises, Therapeutic activity, Neuromuscular re-education, Balance training, Gait training, Patient/Family education, Self Care, Joint mobilization, Dry Needling, Biofeedback, and Manual therapy  PLAN FOR NEXT SESSION: Revisit pelvic floor contraction coordination; core training; the knack.   Heather Roberts, PT, DPT02/15/241:41 PM

## 2022-02-24 NOTE — Patient Instructions (Signed)
Bladder retraining:  Drink 4-8oz an hour ONLY WATER Stop water intake 3 hours before bed Try to go at least 2-3 hours between trips to the bathroom - go whether you need to or not.  For two weeks.   Double-voiding:  This technique is to help with post-void dribbling, or leaking a little bit when you stand up right after urinating.  Use relaxed toileting mechanics to urinate as much as you feel like you have to without straining.  Sit back upright from leaning forward and relax this way for 10-20 seconds.  Lean forward again to finish voiding any amount more.    [facility] [address], [place], [place] [address] Phone # [phone] Fax [phone]

## 2022-03-07 ENCOUNTER — Ambulatory Visit: Payer: Medicare Other

## 2022-03-07 DIAGNOSIS — R293 Abnormal posture: Secondary | ICD-10-CM

## 2022-03-07 DIAGNOSIS — R279 Unspecified lack of coordination: Secondary | ICD-10-CM

## 2022-03-07 DIAGNOSIS — N393 Stress incontinence (female) (male): Secondary | ICD-10-CM

## 2022-03-07 DIAGNOSIS — N3942 Incontinence without sensory awareness: Secondary | ICD-10-CM | POA: Diagnosis not present

## 2022-03-07 DIAGNOSIS — M6281 Muscle weakness (generalized): Secondary | ICD-10-CM

## 2022-03-07 NOTE — Therapy (Signed)
OUTPATIENT PHYSICAL THERAPY TREATMENT NOTE   Patient Name: Yolanda Peters MRN: [id number] DOB:03-Jun-1940, 82 y.o., female Today's Date: 03/07/2022  PCP: Shon Baton, MD REFERRING PROVIDER: Bjorn Loser, MD  END OF SESSION:   PT End of Session - 03/07/22 1136     Visit Number 2    Date for PT Re-Evaluation 05/19/22    Authorization Type [employer] Medicare    PT Start Time 1100    PT Stop Time 1140    PT Time Calculation (min) 40 min    Activity Tolerance Patient tolerated treatment well    Behavior During Therapy [facility] for tasks assessed/performed             Past Medical History:  Diagnosis Date   Allergy    Arthritis    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, skin cnacer   Cancer ([place])    right breast    Hyperlipidemia    no meds taken   Hypertension    Personal history of radiation therapy 2011   rt breast   Past Surgical History:  Procedure Laterality Date   BREAST DUCTAL SYSTEM EXCISION     right breast   BREAST LUMPECTOMY     CATARACT EXTRACTION     bilateral   CESAREAN SECTION     COLONOSCOPY     ELBOW SURGERY     left   I & D EXTREMITY Right 08/06/2020   Procedure: IRRIGATION AND DEBRIDEMENT OF LEG, wound vac placement;  Surgeon: [name], MD;  Location: [facility];  Service: Orthopedics;  Laterality: Right;   I & D EXTREMITY Right 08/11/2020   Procedure: IRRIGATION AND DEBRIDEMENT EXTREMITY SKIN GRAFT;  Surgeon: [name], MD;  Location: [facility];  Service: Orthopedics;  Laterality: Right;   JOINT REPLACEMENT     KNEE ARTHROPLASTY Left    March 2021   MYOMECTOMY     REPLACEMENT TOTAL KNEE Left 03/2019   TOTAL KNEE ARTHROPLASTY Right 06/26/2018   Procedure: TOTAL KNEE ARTHROPLASTY;  Surgeon: Paralee Cancel, MD;  [facility];  Service: Orthopedics;  Laterality: Right;  70 mins   Patient Active Problem List   Diagnosis Date Noted   Ulcer of right leg, with necrosis of muscle (HCC)    Cellulitis of right leg 08/05/2020   Hypokalemia 06/27/2018    Overweight (BMI 25.0-29.9) 06/27/2018   S/P right TKA 06/26/2018   Autoimmune disease ([place]) 03/15/2016   High risk medication use 03/15/2016   Pain in joint of right shoulder 03/15/2016   Bilateral hip pain 03/15/2016   Primary osteoarthritis of both knees 03/15/2016   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   HTN (hypertension) 02/24/2014   Hyperlipidemia 02/24/2014   Breast cancer, right, upper outer quadrant  07/05/2010    REFERRING DIAG: R35.0 (ICD-10-CM) - Frequency of micturition N39.3 (ICD-10-CM) - Stress incontinence (female) (female)  THERAPY DIAG:  Urinary incontinence without sensory awareness  Muscle weakness (generalized)  Unspecified lack of coordination  Abnormal posture  SUI (stress urinary incontinence, female)  Rationale for Evaluation and Treatment Rehabilitation  PERTINENT HISTORY: C-section  PRECAUTIONS: NA  SUBJECTIVE:  SUBJECTIVE STATEMENT:  Pt states that she has only been going to the bathroom 1-2x/night. She has been working hard on voiding schedule and ends up able to hold about every 2.5 hours. She is working regularly on pelvic floor strengthening.    PAIN:  Are you having pain? No  02/24/22 SUBJECTIVE STATEMENT: Pt states that she will go to the bathroom in her pad during the day without awareness.  Fluid intake: Yes: states not enough water, tea, occasional wine     PAIN:  Are you having pain? No     PRECAUTIONS: None   WEIGHT BEARING RESTRICTIONS: No   FALLS:  Has patient fallen in last 6 months? No   LIVING ENVIRONMENT: Lives with: lives with their family Lives in: House/apartment     OCCUPATION: retired   PLOF: Independent   PATIENT GOALS: she is taking a trip in May and June - she would like to not have to worry about incontinence    PERTINENT HISTORY:  C-section Sexual abuse: No   BOWEL MOVEMENT: Pain with bowel movement: No Type of bowel movement:Frequency 1x/day and Strain No Fully empty rectum: Yes: - Leakage: No Pads: No Fiber supplement: No   URINATION: Pain with urination: No Fully empty bladder: Yes: - Stream: Strong Urgency: No Frequency: every 3 hours during the day; 2-6x/night depending on what she has had to drink  Leakage: Coughing, Sneezing, Laughing, Exercise, Lifting, Bending forward, and without sensation Pads: Yes: a couple pads   INTERCOURSE: Not sexually active, no history of pain   PREGNANCY: Vaginal deliveries 0 C-section deliveries 1 Currently pregnant No   PROLAPSE: None     OBJECTIVE:  02/24/22:   COGNITION: Overall cognitive status: Within functional limits for tasks assessed                          SENSATION: Light touch: Appears intact Proprioception: Appears intact     PALPATION:   General  no tenderness in abdomen or urgency with palpation of bladder                  External Perineal Exam WNL                             Internal Pelvic Floor no tenderness, WNL   Patient confirms identification and approves PT to assess internal pelvic floor and treatment Yes   PELVIC MMT:   MMT eval  Vaginal 1/5, improved to 2/5 with training, 2 second endurance, poor coordination of active contraction  Internal Anal Sphincter    External Anal Sphincter    Puborectalis    Diastasis Recti 2 finger width at umbilicus with distortion  (Blank rows = not tested)         TONE: WNL   PROLAPSE: None detected in supine this session   TODAY'S TREATMENT:  03/07/22: Neuromuscular re-education: Pelvic floor contractions in standing: Regular stance 10x Wide stance 10x Staggered stance 10x bil Transversus abdominus training with multimodal cues for improved motor control and breath coordination Supine march 2 x 10 Supine UE ball press 10x Supine ball squeeze/hip  adduction 10x Seated hip adduction ball squeeze 10x Seated hip abduction band press 10x Seated hip internal rotation 2 x 10 yellow loop/yoga block              DATE: 02/24/22  EVAL  Neuromuscular re-education: Pt provides verbal consent for internal vaginal/rectal pelvic floor exam.  Internal vaginal pelvic floor contraction training with breath coordination Therapeutic activities: Double voiding Strict bladder retraining       PATIENT EDUCATION:  Education details: see above Person educated: Patient Education method: Explanation, Demonstration, Tactile cues, Verbal cues, and Handouts Education comprehension: verbalized understanding   HOME EXERCISE PROGRAM: GHPPAAWH   ASSESSMENT:   CLINICAL IMPRESSION: Pt is doing very well demonstrated by progress with nocturia, frequency during the day, and less incontinence. She was able to progress pelvic floor contractions in standing positions and begin core training/progressions. She did have difficulty with appropriate coordination of pelvic floor/core/breathing, but did better with consistent verbal cues. HEP updated. She will continue to benefit from skilled PT intervention in order to decrease urinary incontinence, improve bladder habits, and improve QOL preparing her for two big trips this year.    OBJECTIVE IMPAIRMENTS: decreased coordination, decreased endurance, decreased strength, increased fascial restrictions, increased muscle spasms, and pain.    ACTIVITY LIMITATIONS: lifting, bending, and continence   PARTICIPATION LIMITATIONS: community activity   PERSONAL FACTORS: Behavior pattern are also affecting patient's functional outcome.    REHAB POTENTIAL: Good   CLINICAL DECISION MAKING: Stable/uncomplicated   EVALUATION COMPLEXITY: Low     GOALS: Goals reviewed with patient? Yes   SHORT TERM GOALS: Target date: 04/07/22   Pt will be independent with HEP.    Baseline: Goal status: INITIAL   2.  Pt will be  independent with the knack, urge suppression technique, and double voiding in order to improve bladder habits and decrease urinary incontinence.    Baseline:  Goal status: INITIAL     LONG TERM GOALS: Target date: 05/19/2022   Pt will be independent with advanced HEP.    Baseline:  Goal status: INITIAL   2.  Pt will demonstrate normal pelvic floor muscle tone and A/ROM, able to achieve 4/5 strength with contractions and 10 sec endurance, in order to provide appropriate lumbopelvic support in functional activities.    Baseline:  Goal status: INITIAL   3.  Pt will report no episodes of urinary incontinence wihtout sensation.  Baseline:  Goal status: INITIAL   4.  Pt will report no leaks with laughing, coughing, sneezing in order to improve comfort with interpersonal relationships and community activities.    Baseline:  Goal status: INITIAL   5.  Pt will feel confident without using pads daily.  Baseline:  Goal status: INITIAL   6.  Pt will decrease frequency of nightly trips to the bathroom to 1 or less in order to get restful sleep.    Baseline:  Goal status: INITIAL   PLAN:   PT FREQUENCY: 1-2x/week   PT DURATION: 10 weeks   PLANNED INTERVENTIONS: Therapeutic exercises, Therapeutic activity, Neuromuscular re-education, Balance training, Gait training, Patient/Family education, Self Care, Joint mobilization, Dry Needling, Biofeedback, and Manual therapy   PLAN FOR NEXT SESSION: Revisit pelvic floor contraction coordination; core training; the knack.  Heather Roberts, PT, DPT02/26/2411:39 AM

## 2022-03-11 ENCOUNTER — Other Ambulatory Visit (HOSPITAL_BASED_OUTPATIENT_CLINIC_OR_DEPARTMENT_OTHER): Payer: Self-pay

## 2022-04-15 ENCOUNTER — Ambulatory Visit: Payer: Medicare Other | Attending: Urology

## 2022-04-15 DIAGNOSIS — R2689 Other abnormalities of gait and mobility: Secondary | ICD-10-CM | POA: Diagnosis present

## 2022-04-15 DIAGNOSIS — M6281 Muscle weakness (generalized): Secondary | ICD-10-CM

## 2022-04-15 DIAGNOSIS — N393 Stress incontinence (female) (male): Secondary | ICD-10-CM | POA: Diagnosis present

## 2022-04-15 DIAGNOSIS — N3942 Incontinence without sensory awareness: Secondary | ICD-10-CM | POA: Diagnosis present

## 2022-04-15 DIAGNOSIS — R293 Abnormal posture: Secondary | ICD-10-CM

## 2022-04-15 DIAGNOSIS — R279 Unspecified lack of coordination: Secondary | ICD-10-CM

## 2022-04-15 NOTE — Therapy (Signed)
OUTPATIENT PHYSICAL THERAPY TREATMENT NOTE   Patient Name: Yolanda Peters MRN: [id number] DOB:11-13-1940, 82 y.o., female Today's Date: 04/15/2022  PCP: Creola Corn, MD REFERRING PROVIDER: Alfredo Martinez, MD  END OF SESSION:   PT End of Session - 04/15/22 0939     Visit Number 3    Date for PT Re-Evaluation 05/19/22    Authorization Type [employer] Medicare    PT Start Time [phone]    PT Stop Time 1010    PT Time Calculation (min) 32 min    Activity Tolerance Patient tolerated treatment well    Behavior During Therapy [facility] for tasks assessed/performed              Past Medical History:  Diagnosis Date   Allergy    Arthritis    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, skin cnacer   Cancer    right breast    Hyperlipidemia    no meds taken   Hypertension    Personal history of radiation therapy 2011   rt breast   Past Surgical History:  Procedure Laterality Date   BREAST DUCTAL SYSTEM EXCISION     right breast   BREAST LUMPECTOMY     CATARACT EXTRACTION     bilateral   CESAREAN SECTION     COLONOSCOPY     ELBOW SURGERY     left   I & D EXTREMITY Right 08/06/2020   Procedure: IRRIGATION AND DEBRIDEMENT OF LEG, wound vac placement;  Surgeon: Nadara Mustard, MD;  Location: [facility] OR;  Service: Orthopedics;  Laterality: Right;   I & D EXTREMITY Right 08/11/2020   Procedure: IRRIGATION AND DEBRIDEMENT EXTREMITY SKIN GRAFT;  Surgeon: Nadara Mustard, MD;  Location: [facility] OR;  Service: Orthopedics;  Laterality: Right;   JOINT REPLACEMENT     KNEE ARTHROPLASTY Left    March 2021   MYOMECTOMY     REPLACEMENT TOTAL KNEE Left 03/2019   TOTAL KNEE ARTHROPLASTY Right 06/26/2018   Procedure: TOTAL KNEE ARTHROPLASTY;  Surgeon: Durene Romans, MD;  Location: [facility] ORS;  Service: Orthopedics;  Laterality: Right;  70 mins   Patient Active Problem List   Diagnosis Date Noted   Ulcer of right leg, with necrosis of muscle    Cellulitis of right leg 08/05/2020   Hypokalemia 06/27/2018   Overweight  (BMI 25.0-29.9) 06/27/2018   S/P right TKA 06/26/2018   Autoimmune disease 03/15/2016   High risk medication use 03/15/2016   Pain in joint of right shoulder 03/15/2016   Bilateral hip pain 03/15/2016   Primary osteoarthritis of both knees 03/15/2016   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   HTN (hypertension) 02/24/2014   Hyperlipidemia 02/24/2014   Breast cancer, right, upper outer quadrant  07/05/2010    REFERRING DIAG: R35.0 (ICD-10-CM) - Frequency of micturition N39.3 (ICD-10-CM) - Stress incontinence (female) (female)  THERAPY DIAG:  Urinary incontinence without sensory awareness  Muscle weakness (generalized)  Unspecified lack of coordination  Abnormal posture  SUI (stress urinary incontinence, female)  Rationale for Evaluation and Treatment Rehabilitation  PERTINENT HISTORY: C-section  PRECAUTIONS: NA  SUBJECTIVE:  SUBJECTIVE STATEMENT:  Pt states that she is doing well. She has questions on exercises. She has realized that sometimes she will hold bladder for 8+ hours during the day. She feels like she needs to set a timer to go to the bathroom more often. She is only waking up 1-2x/night.   PAIN:  Are you having pain? No  02/24/22 SUBJECTIVE STATEMENT: Pt states that she will go to the bathroom in her pad during the day without awareness.  Fluid intake: Yes: states not enough water, tea, occasional wine     PAIN:  Are you having pain? No     PRECAUTIONS: None   WEIGHT BEARING RESTRICTIONS: No   FALLS:  Has patient fallen in last 6 months? No   LIVING ENVIRONMENT: Lives with: lives with their family Lives in: House/apartment     OCCUPATION: retired   PLOF: Independent   PATIENT GOALS: she is taking a trip in May and June - she would like to not have to worry about  incontinence   PERTINENT HISTORY:  C-section Sexual abuse: No   BOWEL MOVEMENT: Pain with bowel movement: No Type of bowel movement:Frequency 1x/day and Strain No Fully empty rectum: Yes: - Leakage: No Pads: No Fiber supplement: No   URINATION: Pain with urination: No Fully empty bladder: Yes: - Stream: Strong Urgency: No Frequency: every 3 hours during the day; 2-6x/night depending on what she has had to drink  Leakage: Coughing, Sneezing, Laughing, Exercise, Lifting, Bending forward, and without sensation Pads: Yes: a couple pads   INTERCOURSE: Not sexually active, no history of pain   PREGNANCY: Vaginal deliveries 0 C-section deliveries 1 Currently pregnant No   PROLAPSE: None     OBJECTIVE:  02/24/22:   COGNITION: Overall cognitive status: Within functional limits for tasks assessed                          SENSATION: Light touch: Appears intact Proprioception: Appears intact     PALPATION:   General  no tenderness in abdomen or urgency with palpation of bladder                  External Perineal Exam WNL                             Internal Pelvic Floor no tenderness, WNL   Patient confirms identification and approves PT to assess internal pelvic floor and treatment Yes   PELVIC MMT:   MMT eval  Vaginal 1/5, improved to 2/5 with training, 2 second endurance, poor coordination of active contraction  Internal Anal Sphincter    External Anal Sphincter    Puborectalis    Diastasis Recti 2 finger width at umbilicus with distortion  (Blank rows = not tested)         TONE: WNL   PROLAPSE: None detected in supine this session   TODAY'S TREATMENT 04/15/22 Neuromuscular re-education: Supine march 2 x 10 Supine UE ball press 10x Supine weight drop 2lbs 15x Resisted supine march with diagonal resistance 15x bil Exercises: Supine ball squeeze/hip adduction 10x Seated hip adduction ball squeeze 10x Seated hip abduction band press 10x Seated hip  internal rotation 2 x 10 yellow loop/yoga block Bridge with hip adduction 2 x 10  TREATMENT:  03/07/22: Neuromuscular re-education: Pelvic floor contractions in standing: Regular stance 10x Wide stance 10x Staggered stance 10x bil Transversus abdominus training with multimodal cues for  improved motor control and breath coordination Supine march 2 x 10 Supine UE ball press 10x Supine ball squeeze/hip adduction 10x Seated hip adduction ball squeeze 10x Seated hip abduction band press 10x Seated hip internal rotation 2 x 10 yellow loop/yoga block              DATE: 02/24/22  EVAL  Neuromuscular re-education: Pt provides verbal consent for internal vaginal/rectal pelvic floor exam. Internal vaginal pelvic floor contraction training with breath coordination Therapeutic activities: Double voiding Strict bladder retraining       PATIENT EDUCATION:  Education details: see above Person educated: Patient Education method: Explanation, Demonstration, Tactile cues, Verbal cues, and Handouts Education comprehension: verbalized understanding   HOME EXERCISE PROGRAM: [id number]   ASSESSMENT:   CLINICAL IMPRESSION: Pt is beginning to see progress with only waking 1-2x/night instead of up to 6x. She has improved awareness of urination habits and that she needs to set timer in order to go every 3 hours. We reviewed current exercises for form, breath, and core/pelvic floor activation. Cues required for appropriate deep core activation. Good tolerance to all progressions. She will continue to benefit from skilled PT intervention in order to decrease urinary incontinence, improve bladder habits, and improve QOL preparing her for two big trips this year.    OBJECTIVE IMPAIRMENTS: decreased coordination, decreased endurance, decreased strength, increased fascial restrictions, increased muscle spasms, and pain.    ACTIVITY LIMITATIONS: lifting, bending, and continence   PARTICIPATION LIMITATIONS:  community activity   PERSONAL FACTORS: Behavior pattern are also affecting patient's functional outcome.    REHAB POTENTIAL: Good   CLINICAL DECISION MAKING: Stable/uncomplicated   EVALUATION COMPLEXITY: Low     GOALS: Goals reviewed with patient? Yes   SHORT TERM GOALS: Target date: 04/07/22 - updated 04/15/22   Pt will be independent with HEP.    Baseline: Goal status: MET 04/15/22   2.  Pt will be independent with the knack, urge suppression technique, and double voiding in order to improve bladder habits and decrease urinary incontinence.    Baseline:  Goal status: IN PROGRESS     LONG TERM GOALS: Target date: 05/19/2022 - updated 04/15/22   Pt will be independent with advanced HEP.    Baseline:  Goal status: IN PROGRESS   2.  Pt will demonstrate normal pelvic floor muscle tone and A/ROM, able to achieve 4/5 strength with contractions and 10 sec endurance, in order to provide appropriate lumbopelvic support in functional activities.    Baseline:  Goal status: IN PROGRESS   3.  Pt will report no episodes of urinary incontinence wihtout sensation.  Baseline:  Goal status: IN PROGRESS   4.  Pt will report no leaks with laughing, coughing, sneezing in order to improve comfort with interpersonal relationships and community activities.    Baseline:  Goal status: IN PROGRESS   5.  Pt will feel confident without using pads daily.  Baseline:  Goal status: IN PROGRESS   6.  Pt will decrease frequency of nightly trips to the bathroom to 1 or less in order to get restful sleep.    Baseline:  Goal status: IN PROGRESS   PLAN:   PT FREQUENCY: 1-2x/week   PT DURATION: 10 weeks   PLANNED INTERVENTIONS: Therapeutic exercises, Therapeutic activity, Neuromuscular re-education, Balance training, Gait training, Patient/Family education, Self Care, Joint mobilization, Dry Needling, Biofeedback, and Manual therapy   PLAN FOR NEXT SESSION: Revisit pelvic floor contraction  coordination; core training; the knack; functional strengthening progressions.   Baxter HireKristen  Alvy Beal, PT, DPT04/05/2408:50 AM

## 2022-04-19 ENCOUNTER — Ambulatory Visit: Payer: Medicare Other

## 2022-04-19 DIAGNOSIS — N3942 Incontinence without sensory awareness: Secondary | ICD-10-CM | POA: Diagnosis not present

## 2022-04-19 DIAGNOSIS — R293 Abnormal posture: Secondary | ICD-10-CM

## 2022-04-19 DIAGNOSIS — R2689 Other abnormalities of gait and mobility: Secondary | ICD-10-CM

## 2022-04-19 DIAGNOSIS — M6281 Muscle weakness (generalized): Secondary | ICD-10-CM

## 2022-04-19 DIAGNOSIS — N393 Stress incontinence (female) (male): Secondary | ICD-10-CM

## 2022-04-19 DIAGNOSIS — R279 Unspecified lack of coordination: Secondary | ICD-10-CM

## 2022-04-19 NOTE — Therapy (Signed)
OUTPATIENT PHYSICAL THERAPY TREATMENT NOTE   Patient Name: Yolanda Peters MRN: [id number] DOB:07/25/1940, 82 y.o., female Today's Date: 04/19/2022  PCP: Creola Cornusso, John, MD REFERRING PROVIDER: Alfredo MartinezMacDiarmid, Scott, MD  END OF SESSION:   PT End of Session - 04/19/22 1406     Visit Number 4    Date for PT Re-Evaluation 05/19/22    Authorization Type [employer] Medicare    PT Start Time 1402    PT Stop Time 1440    PT Time Calculation (min) 38 min    Activity Tolerance Patient tolerated treatment well    Behavior [facility] for tasks assessed/performed              Past Medical History:  Diagnosis Date   Allergy    Arthritis    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, skin cnacer   Cancer    right breast    Hyperlipidemia    no meds taken   Hypertension    Personal history of radiation therapy 2011   rt breast   Past Surgical History:  Procedure Laterality Date   BREAST DUCTAL SYSTEM EXCISION     right breast   BREAST LUMPECTOMY     CATARACT EXTRACTION     bilateral   CESAREAN SECTION     COLONOSCOPY     ELBOW SURGERY     left   I & D EXTREMITY Right 08/06/2020   Procedure: IRRIGATION AND DEBRIDEMENT OF LEG, wound vac placement;  Surgeon: Nadara Mustarduda, Marcus V, MD;  Location: [facility] OR;  Service: Orthopedics;  Laterality: Right;   I & D EXTREMITY Right 08/11/2020   Procedure: IRRIGATION AND DEBRIDEMENT EXTREMITY SKIN GRAFT;  Surgeon: Nadara Mustarduda, Marcus V, MD;  [facility];  Service: Orthopedics;  Laterality: Right;   JOINT REPLACEMENT     KNEE ARTHROPLASTY Left    March 2021   MYOMECTOMY     REPLACEMENT TOTAL KNEE Left 03/2019   TOTAL KNEE ARTHROPLASTY Right 06/26/2018   Procedure: TOTAL KNEE ARTHROPLASTY;  Surgeon: Durene Romanslin, Matthew, MD;  Location: [facility] ORS;  Service: Orthopedics;  Laterality: Right;  70 mins   Patient Active Problem List   Diagnosis Date Noted   Ulcer of right leg, with necrosis of muscle    Cellulitis of right leg 08/05/2020   Hypokalemia 06/27/2018   Overweight  (BMI 25.0-29.9) 06/27/2018   S/P right TKA 06/26/2018   Autoimmune disease 03/15/2016   High risk medication use 03/15/2016   Pain in joint of right shoulder 03/15/2016   Bilateral hip pain 03/15/2016   Primary osteoarthritis of both knees 03/15/2016   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   HTN (hypertension) 02/24/2014   Hyperlipidemia 02/24/2014   Breast cancer, right, upper outer quadrant  07/05/2010    REFERRING DIAG: R35.0 (ICD-10-CM) - Frequency of micturition N39.3 (ICD-10-CM) - Stress incontinence (female) (female)  THERAPY DIAG:  Urinary incontinence without sensory awareness  Muscle weakness (generalized)  Unspecified lack of coordination  Abnormal posture  SUI (stress urinary incontinence, female)  Other abnormalities of gait and mobility  Rationale for Evaluation and Treatment Rehabilitation  PERTINENT HISTORY: C-section  PRECAUTIONS: NA  SUBJECTIVE:  SUBJECTIVE STATEMENT:  Pt states that she is about the same except that she had one night in which she did not wake up to urinate at all.    PAIN:  Are you having pain? No  02/24/22 SUBJECTIVE STATEMENT: Pt states that she will go to the bathroom in her pad during the day without awareness.  Fluid intake: Yes: states not enough water, tea, occasional wine     PAIN:  Are you having pain? No     PRECAUTIONS: None   WEIGHT BEARING RESTRICTIONS: No   FALLS:  Has patient fallen in last 6 months? No   LIVING ENVIRONMENT: Lives with: lives with their family Lives in: House/apartment     OCCUPATION: retired   PLOF: Independent   PATIENT GOALS: she is taking a trip in May and June - she would like to not have to worry about incontinence   PERTINENT HISTORY:  C-section Sexual abuse: No   BOWEL MOVEMENT: Pain with  bowel movement: No Type of bowel movement:Frequency 1x/day and Strain No Fully empty rectum: Yes: - Leakage: No Pads: No Fiber supplement: No   URINATION: Pain with urination: No Fully empty bladder: Yes: - Stream: Strong Urgency: No Frequency: every 3 hours during the day; 2-6x/night depending on what she has had to drink  Leakage: Coughing, Sneezing, Laughing, Exercise, Lifting, Bending forward, and without sensation Pads: Yes: a couple pads   INTERCOURSE: Not sexually active, no history of pain   PREGNANCY: Vaginal deliveries 0 C-section deliveries 1 Currently pregnant No   PROLAPSE: None     OBJECTIVE:  02/24/22:   COGNITION: Overall cognitive status: Within functional limits for tasks assessed                          SENSATION: Light touch: Appears intact Proprioception: Appears intact     PALPATION:   General  no tenderness in abdomen or urgency with palpation of bladder                  External Perineal Exam WNL                             Internal Pelvic Floor no tenderness, WNL   Patient confirms identification and approves PT to assess internal pelvic floor and treatment Yes   PELVIC MMT:   MMT eval  Vaginal 1/5, improved to 2/5 with training, 2 second endurance, poor coordination of active contraction  Internal Anal Sphincter    External Anal Sphincter    Puborectalis    Diastasis Recti 2 finger width at umbilicus with distortion  (Blank rows = not tested)         TONE: WNL   PROLAPSE: None detected in supine this session   TODAY'S TREATMENT 04/19/22 Neuromuscular re-education: Seated march 2lb weight 3 x 10 Seated chop 2lb weight 15x bil Seated crunch 10x bil 2lb Exercises: Seated hip adduction ball squeeze 10x Seated hip abduction band press 10x Seated hip internal rotation 2 x 10 yellow loop/yoga block Therapeutic activities: Squats with hip adduction to table 2 x 10 Heel raises 3 x 10 with Bil UE support    TREATMENT  04/15/22 Neuromuscular re-education: Supine march 2 x 10 Supine UE ball press 10x Supine weight drop 2lbs 15x Resisted supine march with diagonal resistance 15x bil Exercises: Supine ball squeeze/hip adduction 10x Seated hip adduction ball squeeze 10x Seated hip abduction band press 10x  Seated hip internal rotation 2 x 10 yellow loop/yoga block Bridge with hip adduction 2 x 10  TREATMENT:  03/07/22: Neuromuscular re-education: Pelvic floor contractions in standing: Regular stance 10x Wide stance 10x Staggered stance 10x bil Transversus abdominus training with multimodal cues for improved motor control and breath coordination Supine march 2 x 10 Supine UE ball press 10x Supine ball squeeze/hip adduction 10x Seated hip adduction ball squeeze 10x Seated hip abduction band press 10x Seated hip internal rotation 2 x 10 yellow loop/yoga block   PATIENT EDUCATION:  Education details: see above Person educated: Patient Education method: Programmer, multimedia, Demonstration, Actor cues, Verbal cues, and Handouts Education comprehension: verbalized understanding   HOME EXERCISE PROGRAM: [id number]   ASSESSMENT:   CLINICAL IMPRESSION: Pt doing very well with one night in which she did not wake to urinate once. She has been performing exercises and does not have questions. We reviewed seated exercises and progressed to include more challenging core and hip strengthening with appropriate breathing/pressure management. Cues required for breathing and correct motor control with exercises. She will continue to benefit from skilled PT intervention in order to decrease urinary incontinence, improve bladder habits, and improve QOL preparing her for two big trips this year.    OBJECTIVE IMPAIRMENTS: decreased coordination, decreased endurance, decreased strength, increased fascial restrictions, increased muscle spasms, and pain.    ACTIVITY LIMITATIONS: lifting, bending, and continence   PARTICIPATION  LIMITATIONS: community activity   PERSONAL FACTORS: Behavior pattern are also affecting patient's functional outcome.    REHAB POTENTIAL: Good   CLINICAL DECISION MAKING: Stable/uncomplicated   EVALUATION COMPLEXITY: Low     GOALS: Goals reviewed with patient? Yes   SHORT TERM GOALS: Target date: 04/07/22 - updated 04/15/22   Pt will be independent with HEP.    Baseline: Goal status: MET 04/15/22   2.  Pt will be independent with the knack, urge suppression technique, and double voiding in order to improve bladder habits and decrease urinary incontinence.    Baseline:  Goal status: IN PROGRESS     LONG TERM GOALS: Target date: 05/19/2022 - updated 04/15/22   Pt will be independent with advanced HEP.    Baseline:  Goal status: IN PROGRESS   2.  Pt will demonstrate normal pelvic floor muscle tone and A/ROM, able to achieve 4/5 strength with contractions and 10 sec endurance, in order to provide appropriate lumbopelvic support in functional activities.    Baseline:  Goal status: IN PROGRESS   3.  Pt will report no episodes of urinary incontinence wihtout sensation.  Baseline:  Goal status: IN PROGRESS   4.  Pt will report no leaks with laughing, coughing, sneezing in order to improve comfort with interpersonal relationships and community activities.    Baseline:  Goal status: IN PROGRESS   5.  Pt will feel confident without using pads daily.  Baseline:  Goal status: IN PROGRESS   6.  Pt will decrease frequency of nightly trips to the bathroom to 1 or less in order to get restful sleep.    Baseline:  Goal status: IN PROGRESS   PLAN:   PT FREQUENCY: 1-2x/week   PT DURATION: 10 weeks   PLANNED INTERVENTIONS: Therapeutic exercises, Therapeutic activity, Neuromuscular re-education, Balance training, Gait training, Patient/Family education, Self Care, Joint mobilization, Dry Needling, Biofeedback, and Manual therapy   PLAN FOR NEXT SESSION: Revisit pelvic floor  contraction coordination; core training; the knack; functional strengthening progressions.   Julio Alm, PT, DPT04/09/242:39 PM

## 2022-04-26 ENCOUNTER — Ambulatory Visit: Payer: Medicare Other

## 2022-04-26 DIAGNOSIS — N393 Stress incontinence (female) (male): Secondary | ICD-10-CM

## 2022-04-26 DIAGNOSIS — N3942 Incontinence without sensory awareness: Secondary | ICD-10-CM | POA: Diagnosis not present

## 2022-04-26 DIAGNOSIS — M6281 Muscle weakness (generalized): Secondary | ICD-10-CM

## 2022-04-26 DIAGNOSIS — R293 Abnormal posture: Secondary | ICD-10-CM

## 2022-04-26 DIAGNOSIS — R279 Unspecified lack of coordination: Secondary | ICD-10-CM

## 2022-04-26 NOTE — Therapy (Addendum)
OUTPATIENT PHYSICAL THERAPY TREATMENT NOTE   Patient Name: Yolanda Peters MRN: [id number] DOB:May 30, 1940, 82 y.o., female Today's Date: 04/26/2022  PCP: Creola Corn, MD REFERRING PROVIDER: Alfredo Martinez, MD  END OF SESSION:   PT End of Session - 04/26/22 1448     Visit Number 5    Date for PT Re-Evaluation 05/19/22    Authorization Type [employer] Medicare    PT Start Time 1446    PT Stop Time 1526    PT Time Calculation (min) 40 min    Activity Tolerance Patient tolerated treatment well    Behavior During Therapy [facility] for tasks assessed/performed               Past Medical History:  Diagnosis Date   Allergy    Arthritis    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, skin cnacer   Cancer    right breast    Hyperlipidemia    no meds taken   Hypertension    Personal history of radiation therapy 2011   rt breast   Past Surgical History:  Procedure Laterality Date   BREAST DUCTAL SYSTEM EXCISION     right breast   BREAST LUMPECTOMY     CATARACT EXTRACTION     bilateral   CESAREAN SECTION     COLONOSCOPY     ELBOW SURGERY     left   I & D EXTREMITY Right 08/06/2020   Procedure: IRRIGATION AND DEBRIDEMENT OF LEG, wound vac placement;  Surgeon: Nadara Mustard, MD;  Location: [facility] OR;  Service: Orthopedics;  Laterality: Right;   I & D EXTREMITY Right 08/11/2020   Procedure: IRRIGATION AND DEBRIDEMENT EXTREMITY SKIN GRAFT;  Surgeon: Nadara Mustard, MD;  Location: [facility] OR;  Service: Orthopedics;  Laterality: Right;   JOINT REPLACEMENT     KNEE ARTHROPLASTY Left    March 2021   MYOMECTOMY     REPLACEMENT TOTAL KNEE Left 03/2019   TOTAL KNEE ARTHROPLASTY Right 06/26/2018   Procedure: TOTAL KNEE ARTHROPLASTY;  Surgeon: Durene Romans, MD;  Location: [facility] ORS;  Service: Orthopedics;  Laterality: Right;  70 mins   Patient Active Problem List   Diagnosis Date Noted   Ulcer of right leg, with necrosis of muscle    Cellulitis of right leg 08/05/2020   Hypokalemia 06/27/2018   Overweight  (BMI 25.0-29.9) 06/27/2018   S/P right TKA 06/26/2018   Autoimmune disease 03/15/2016   High risk medication use 03/15/2016   Pain in joint of right shoulder 03/15/2016   Bilateral hip pain 03/15/2016   Primary osteoarthritis of both knees 03/15/2016   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   HTN (hypertension) 02/24/2014   Hyperlipidemia 02/24/2014   Breast cancer, right, upper outer quadrant  07/05/2010    REFERRING DIAG: R35.0 (ICD-10-CM) - Frequency of micturition N39.3 (ICD-10-CM) - Stress incontinence (female) (female)  THERAPY DIAG:  Urinary incontinence without sensory awareness  Muscle weakness (generalized)  Unspecified lack of coordination  Abnormal posture  SUI (stress urinary incontinence, female)  Rationale for Evaluation and Treatment Rehabilitation  PERTINENT HISTORY: C-section  PRECAUTIONS: NA  SUBJECTIVE:  SUBJECTIVE STATEMENT: Pt states that she is having an increase in Lt hip pain. She did go to the pool and swim which was helpful. She is supposed to go get cortisone injection after she leaves this appointment. Pt states that she only woke up once last night at 530 in the morning.    PAIN:  Are you having pain? No  02/24/22 SUBJECTIVE STATEMENT: Pt states that she will go to the bathroom in her pad during the day without awareness.  Fluid intake: Yes: states not enough water, tea, occasional wine     PAIN:  Are you having pain? No     PRECAUTIONS: None   WEIGHT BEARING RESTRICTIONS: No   FALLS:  Has patient fallen in last 6 months? No   LIVING ENVIRONMENT: Lives with: lives with their family Lives in: House/apartment     OCCUPATION: retired   PLOF: Independent   PATIENT GOALS: she is taking a trip in May and June - she would like to not have to worry about  incontinence   PERTINENT HISTORY:  C-section Sexual abuse: No   BOWEL MOVEMENT: Pain with bowel movement: No Type of bowel movement:Frequency 1x/day and Strain No Fully empty rectum: Yes: - Leakage: No Pads: No Fiber supplement: No   URINATION: Pain with urination: No Fully empty bladder: Yes: - Stream: Strong Urgency: No Frequency: every 3 hours during the day; 2-6x/night depending on what she has had to drink  Leakage: Coughing, Sneezing, Laughing, Exercise, Lifting, Bending forward, and without sensation Pads: Yes: a couple pads   INTERCOURSE: Not sexually active, no history of pain   PREGNANCY: Vaginal deliveries 0 C-section deliveries 1 Currently pregnant No   PROLAPSE: None     OBJECTIVE:  02/24/22:   COGNITION: Overall cognitive status: Within functional limits for tasks assessed                          SENSATION: Light touch: Appears intact Proprioception: Appears intact     PALPATION:   General  no tenderness in abdomen or urgency with palpation of bladder                  External Perineal Exam WNL                             Internal Pelvic Floor no tenderness, WNL   Patient confirms identification and approves PT to assess internal pelvic floor and treatment Yes   PELVIC MMT:   MMT eval  Vaginal 1/5, improved to 2/5 with training, 2 second endurance, poor coordination of active contraction  Internal Anal Sphincter    External Anal Sphincter    Puborectalis    Diastasis Recti 2 finger width at umbilicus with distortion  (Blank rows = not tested)         TONE: WNL   PROLAPSE: None detected in supine this session   TODAY'S TREATMENT 04/26/22 Exercises: Seated piriformis stretch 60 seconds bil  Seated hamstring stretch 60 seconds bil Bridge with hip adduction 2 x 10 Bridge with clam 2 x 10 Clam shell 2 x 10 bil Reverse clam shell 2 x 10 bil Therapeutic activities: Voiding schedule - set phone reminders for every 3  hours   TREATMENT 04/19/22 Neuromuscular re-education: Seated march 2lb weight 3 x 10 Seated chop 2lb weight 15x bil Seated crunch 10x bil 2lb Exercises: Seated hip adduction ball squeeze 10x Seated hip abduction  band press 10x Seated hip internal rotation 2 x 10 yellow loop/yoga block Therapeutic activities: Squats with hip adduction to table 2 x 10 Heel raises 3 x 10 with Bil UE support    TREATMENT 04/15/22 Neuromuscular re-education: Supine march 2 x 10 Supine UE ball press 10x Supine weight drop 2lbs 15x Resisted supine march with diagonal resistance 15x bil Exercises: Supine ball squeeze/hip adduction 10x Seated hip adduction ball squeeze 10x Seated hip abduction band press 10x Seated hip internal rotation 2 x 10 yellow loop/yoga block Bridge with hip adduction 2 x 10   PATIENT EDUCATION:  Education details: see above Person educated: Patient Education method: Programmer, multimedia, Facilities manager, Actor cues, Verbal cues, and Handouts Education comprehension: verbalized understanding   HOME EXERCISE PROGRAM: [id number]   ASSESSMENT:   CLINICAL IMPRESSION: Pt doing very well still with bladder control. She is having increased Lt hip pain. Believe that weakness in Lt hip may also be impacting LT hip dysfunction. We discussed how continuing hip strengthening activities will help improve pelvic floor in addition to helping reduce her chronic Lt hip discomfort. We made sure she had several stretches for hips to make sure that any increase in tightness is well managed and is not causing increased pain levels. She will continue to benefit from skilled PT intervention in order to decrease urinary incontinence, improve bladder habits, and improve QOL preparing her for two big trips this year.    OBJECTIVE IMPAIRMENTS: decreased coordination, decreased endurance, decreased strength, increased fascial restrictions, increased muscle spasms, and pain.    ACTIVITY LIMITATIONS: lifting,  bending, and continence   PARTICIPATION LIMITATIONS: community activity   PERSONAL FACTORS: Behavior pattern are also affecting patient's functional outcome.    REHAB POTENTIAL: Good   CLINICAL DECISION MAKING: Stable/uncomplicated   EVALUATION COMPLEXITY: Low     GOALS: Goals reviewed with patient? Yes   SHORT TERM GOALS: Target date: 04/07/22 - updated 04/15/22   Pt will be independent with HEP.    Baseline: Goal status: MET 04/15/22   2.  Pt will be independent with the knack, urge suppression technique, and double voiding in order to improve bladder habits and decrease urinary incontinence.    Baseline:  Goal status: IN PROGRESS     LONG TERM GOALS: Target date: 05/19/2022 - updated 04/15/22   Pt will be independent with advanced HEP.    Baseline:  Goal status: IN PROGRESS   2.  Pt will demonstrate normal pelvic floor muscle tone and A/ROM, able to achieve 4/5 strength with contractions and 10 sec endurance, in order to provide appropriate lumbopelvic support in functional activities.    Baseline:  Goal status: IN PROGRESS   3.  Pt will report no episodes of urinary incontinence wihtout sensation.  Baseline:  Goal status: IN PROGRESS   4.  Pt will report no leaks with laughing, coughing, sneezing in order to improve comfort with interpersonal relationships and community activities.    Baseline:  Goal status: IN PROGRESS   5.  Pt will feel confident without using pads daily.  Baseline:  Goal status: IN PROGRESS   6.  Pt will decrease frequency of nightly trips to the bathroom to 1 or less in order to get restful sleep.    Baseline:  Goal status: IN PROGRESS   PLAN:   PT FREQUENCY: 1-2x/week   PT DURATION: 10 weeks   PLANNED INTERVENTIONS: Therapeutic exercises, Therapeutic activity, Neuromuscular re-education, Balance training, Gait training, Patient/Family education, Self Care, Joint mobilization, Dry Needling, Biofeedback, and Manual  therapy   PLAN FOR  NEXT SESSION: Continue to progress functional strengthening into standing position.   Julio Alm, PT, DPT04/16/243:30 PM  PHYSICAL THERAPY DISCHARGE SUMMARY  Visits from Start of Care: 5  Current functional level related to goals / functional outcomes: Not met   Remaining deficits: See above   Education / Equipment: HEP   Patient agrees to discharge. Patient goals were partially met. Patient is being discharged due to the patient's request.  Julio Alm, PT, DPT04/29/245:09 PM

## 2022-05-03 ENCOUNTER — Ambulatory Visit: Payer: Medicare Other

## 2022-05-09 ENCOUNTER — Telehealth: Payer: Self-pay | Admitting: *Deleted

## 2022-05-09 NOTE — Telephone Encounter (Signed)
Patient requesting lidocaine patches. [facility]. Never prescribed by our office. Patient is going out of town and wants something stronger than over the counter.

## 2022-05-09 NOTE — Telephone Encounter (Signed)
Patient has not been seen in the office sine September 2023.  Lidoderm patches will likely not be covered by her insurance for her current documented diagnoses.   Please clarify if she sees pain management or orthopedics? They may have better success getting lidoderm patches approved

## 2022-05-09 NOTE — Telephone Encounter (Signed)
LMOM

## 2022-05-10 ENCOUNTER — Ambulatory Visit: Payer: Medicare Other

## 2022-05-13 ENCOUNTER — Ambulatory Visit: Payer: Medicare Other | Admitting: Rheumatology

## 2022-09-16 ENCOUNTER — Ambulatory Visit: Payer: Medicare Other | Admitting: Rheumatology

## 2022-09-29 NOTE — Progress Notes (Unsigned)
Office Visit Note  Patient: Yolanda Peters             Date of Birth: Nov 03, 1940           MRN: [id number]             PCP: Creola Corn, MD Referring: Creola Corn, MD Visit Date: 10/12/2022 Occupation: @GUAROCC @  Subjective:  Difficulty ambulating quickly   History of Present Illness: Yolanda Peters is a 82 y.o. female with history of autoimmune disease and osteoarthritis.  She is not currently taking any immunosuppressive agents due to her autoimmune disease being in remission.  She denies any signs or symptoms of an autoimmune disease flare.  She denies any symptoms of Raynaud's phenomenon, recent rashes, sores in her mouth or nose, sicca symptoms, hair loss, swollen lymph nodes, or joint swelling.  Patient states she recently traveled to [place].  She states that she had to walk on uneven terrain and used a cane to assist.  She states during that time she was having difficulty walking past due to the discomfort in her knee replacements as well as the right hip.  She states that since returning from her trip she is no longer having joint pain but is concerned about her inability to walk quickly.  Patient has requested a right hip x-ray today.  She is also interested in pursuing physical therapy.   Activities of Daily Living:  Patient reports morning stiffness for 0 minutes.   Patient Denies nocturnal pain.  Difficulty dressing/grooming: Denies Difficulty climbing stairs: Denies Difficulty getting out of chair: Denies Difficulty using hands for taps, buttons, cutlery, and/or writing: Denies  Review of Systems  Constitutional:  Negative for fatigue.  HENT:  Positive for mouth dryness. Negative for mouth sores.   Eyes:  Negative for dryness.  Respiratory:  Negative for shortness of breath.   Cardiovascular:  Negative for chest pain and palpitations.  Gastrointestinal:  Negative for blood in stool, constipation and diarrhea.  Endocrine: Negative for increased  urination.  Genitourinary:  Negative for involuntary urination.  Musculoskeletal:  Negative for joint pain, gait problem, joint pain, joint swelling, myalgias, muscle weakness, morning stiffness, muscle tenderness and myalgias.  Skin:  Negative for color change, rash, hair loss and sensitivity to sunlight.  Allergic/Immunologic: Negative for susceptible to infections.  Neurological:  Positive for weakness. Negative for dizziness and headaches.  Hematological:  Negative for swollen glands.  Psychiatric/Behavioral:  Negative for depressed mood and sleep disturbance. The patient is not nervous/anxious.     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   HTN (hypertension) 02/24/2014   Hyperlipidemia 02/24/2014   Breast cancer, right, upper outer quadrant  07/05/2010    Past Medical History:  Diagnosis Date   Allergy    Arthritis    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, skin cnacer   Cancer (HCC)    right breast    Hyperlipidemia    no meds taken   Hypertension  Personal history of radiation therapy 2011   rt breast    Family History  Problem Relation Age of Onset   Cancer Mother        bladder   Cancer Father        leukemia   Breast cancer Maternal Aunt 56 - 49   Breast cancer Maternal Aunt 23 - 59   Breast cancer Maternal Aunt 65 - 69   Colon cancer Paternal Aunt        pt is unsure of this   Colon cancer Cousin        pt is unsure of this   Esophageal cancer Neg Hx    Rectal cancer Neg Hx    Stomach cancer Neg Hx     Past Surgical History:  Procedure Laterality Date   BREAST DUCTAL SYSTEM EXCISION     right breast   BREAST LUMPECTOMY     CATARACT EXTRACTION     bilateral   CESAREAN SECTION     COLONOSCOPY     ELBOW SURGERY     left   I & D EXTREMITY Right 08/06/2020   Procedure: IRRIGATION AND DEBRIDEMENT OF LEG, wound vac placement;  Surgeon: Nadara Mustard, MD;  Location: [facility] OR;  Service: Orthopedics;  Laterality: Right;   I & D EXTREMITY Right 08/11/2020   Procedure: IRRIGATION AND DEBRIDEMENT EXTREMITY SKIN GRAFT;  Surgeon: Nadara Mustard, MD;  Location: [facility] OR;  Service: Orthopedics;  Laterality: Right;   JOINT REPLACEMENT     KNEE ARTHROPLASTY Left    March 2021   MYOMECTOMY     REPLACEMENT TOTAL KNEE Left 03/2019   TOTAL KNEE ARTHROPLASTY Right 06/26/2018   Procedure: TOTAL KNEE ARTHROPLASTY;  Surgeon: Durene Romans, MD;  Location: [facility];  Service: Orthopedics;  Laterality: Right;  70 mins   Social History   Social History Narrative   ** Merged History Encounter **       Immunization History  Administered Date(s) Administered   Influenza Split 09/24/2013, 09/24/2014   PFIZER(Purple Top)SARS-COV-2 Vaccination 01/22/2019, 02/11/2019   Pfizer Covid-19 Vaccine Bivalent Booster 65yrs & up 10/07/2020   Tdap 08/28/2014   Zoster Recombinant(Shingrix) 10/12/2017     Objective: Vital Signs: BP 126/86 (BP Location: Left Arm, Patient Position: Sitting, Cuff Size: Normal)   Pulse 87   Resp 14   Ht 5\' 4"  (1.626 m)   Wt 169 lb 3.2 oz (76.7 kg)   BMI 29.04 kg/m    Physical Exam Vitals and nursing note reviewed.  Constitutional:      Appearance: She is well-developed.  HENT:     Head: Normocephalic and atraumatic.  Eyes:     Conjunctiva/sclera: Conjunctivae normal.  Cardiovascular:     Rate and Rhythm: Normal rate and regular rhythm.     Heart sounds: Normal heart sounds.  Pulmonary:     Effort: Pulmonary effort is normal.     Breath sounds: Normal breath sounds.  Abdominal:      General: Bowel sounds are normal.     Palpations: Abdomen is soft.  Musculoskeletal:     Cervical back: Normal range of motion.  Lymphadenopathy:     Cervical: No cervical adenopathy.  Skin:    General: Skin is warm and dry.     Capillary Refill: Capillary refill takes less than 2 seconds.  Neurological:     Mental Status: She is alert and oriented to person, place, and time.  Psychiatric:        Behavior: Behavior normal.  Musculoskeletal Exam: C-spine has limited range of motion with lateral rotation.  Thoracic kyphosis noted.  Shoulder joints, elbow joints, and wrist joints have good range of motion.  Severe PIP and DIP thickening with subluxation of PIP and DIP joints.  No synovitis or tenderness over MCP joints noted.  Hip joints have good range of motion.  Bilateral knee replacements have good range of motion with no warmth or effusion.  Pedal edema noted in bilateral lower legs.  CDAI Exam: CDAI Score: -- Patient Global: --; Provider Global: -- Swollen: --; Tender: -- Joint Exam 10/12/2022   No joint exam has been documented for this visit   There is currently no information documented on the homunculus. Go to the Rheumatology activity and complete the homunculus joint exam.  Investigation: No additional findings.  Imaging: No results found.  Recent Labs: Lab Results  Component Value Date   WBC 6.5 10/01/2021   HGB 14.7 10/01/2021   PLT 275 10/01/2021   NA 141 10/01/2021   K 3.9 10/01/2021   CL 102 10/01/2021   CO2 29 10/01/2021   GLUCOSE 96 10/01/2021   BUN 16 10/01/2021   CREATININE 0.67 10/01/2021   BILITOT 0.9 10/01/2021   ALKPHOS 87 08/05/2020   AST 20 10/01/2021   ALT 22 10/01/2021   PROT 6.6 10/01/2021   ALBUMIN 4.0 08/05/2020   CALCIUM 9.1 10/01/2021   GFRAA >60 06/27/2018    Speciality Comments: [facility]  Procedures:  No procedures performed Allergies: Penicillins and  Tetracyclines & related    Assessment / Plan:     Visit Diagnoses: Autoimmune disease (HCC) - In remission: She has not had any signs or symptoms of an autoimmune disease flare.  She is not currently taking Plaquenil or any other immunosuppressive agents due to her autoimmune disease remaining in remission.  She has not had any symptoms of Raynaud's phenomenon, Malar rash, cervical lymphadenopathy, sores in her mouth or nose, sicca symptoms, or hair loss.  Lab work from 9/23 was reviewed today in the office: ANA 1:320NH, 1:320 nuclear fine speckled, dsDNA negative, complements WNL, and ESR WNL.  The following lab work will be updated today.  She was advised to notify us if she develops any new or worsening symptoms.  She will follow-up in the office in 1 year or sooner if needed  - Plan: Protein / creatinine ratio, urine, CBC with Differential/Platelet, COMPLETE METABOLIC PANEL WITH GFR, Anti-DNA antibody, double-stranded, C3 and C4, Sedimentation rate, ANA  Primary osteoarthritis of both hands: She has severe PIP and DIP thickening and subluxation consistent with osteoarthritis of both hands.  No synovitis noted on exam.  She is not experiencing any increased discomfort or stiffness in her hands at this time.  Status post total bilateral knee replacement: Patient was recently traveling with a group in [place] at which time she was having to walk on uneven terrain especially while in [place].  She was having difficulty walking quickly due to discomfort involving both knee replacements and the right hip.  She was using a cane to assist while on uneven terrain which was helpful.  Since returning she has not had any joint pain but is concerned about her inability to walk quickly.  On examination she has good range of motion of both knee replacements with no warmth or effusion.  Plan to refer the patient physical therapy as requested.   Pain in right hip - She was recently  traveling and was having to walk  on uneven terrain.  She was having difficulty ambulating quickly due to the discomfort in her right hip.  The discomfort in her right hip has resolved since returning home.  She has good range of motion of the right hip on examination today.  No groin pain currently.  X-rays of the right hip were ordered as requested by the patient.  Plan on referring the patient to physical therapy for lower extremity muscle strengthening as requested.  Plan: XR HIP UNILAT W OR W/O PELVIS 2-3 VIEWS RIGHT  Primary osteoarthritis of both feet: She is not experiencing any discomfort in her feet at this time.  Good ROM of both ankle joints-no tenderness.  Pedal edema noted bilaterally.   Other medical conditions are listed as follows:  History of hyperlipidemia  History of breast cancer  History of hypertension: Blood pressure is 126/86 today in the office.   Orders: Orders Placed This Encounter  Procedures   XR HIP UNILAT W OR W/O PELVIS 2-3 VIEWS RIGHT   Protein / creatinine ratio, urine   CBC with Differential/Platelet   COMPLETE METABOLIC PANEL WITH GFR   Anti-DNA antibody, double-stranded   C3 and C4   Sedimentation rate   ANA   No orders of the defined types were placed in this encounter.    Follow-Up Instructions: Return in about 1 year (around 10/12/2023) for Autoimmune Disease, Osteoarthritis.   Gearldine Bienenstock, PA-C  Note - This record has been created using Dragon software.  Chart creation errors have been sought, but may not always  have been located. Such creation errors do not reflect on  the standard of medical care.

## 2022-10-06 ENCOUNTER — Ambulatory Visit: Payer: Medicare Other | Admitting: Rheumatology

## 2022-10-12 ENCOUNTER — Encounter: Payer: Self-pay | Admitting: Physician Assistant

## 2022-10-12 ENCOUNTER — Ambulatory Visit: Payer: Medicare Other

## 2022-10-12 ENCOUNTER — Ambulatory Visit: Payer: Medicare Other | Attending: Rheumatology | Admitting: Physician Assistant

## 2022-10-12 VITALS — BP 126/86 | HR 87 | Resp 14 | Ht 64.0 in | Wt 169.2 lb

## 2022-10-12 DIAGNOSIS — Z853 Personal history of malignant neoplasm of breast: Secondary | ICD-10-CM

## 2022-10-12 DIAGNOSIS — M19072 Primary osteoarthritis, left ankle and foot: Secondary | ICD-10-CM

## 2022-10-12 DIAGNOSIS — M359 Systemic involvement of connective tissue, unspecified: Secondary | ICD-10-CM | POA: Diagnosis not present

## 2022-10-12 DIAGNOSIS — Z8679 Personal history of other diseases of the circulatory system: Secondary | ICD-10-CM

## 2022-10-12 DIAGNOSIS — Z8639 Personal history of other endocrine, nutritional and metabolic disease: Secondary | ICD-10-CM

## 2022-10-12 DIAGNOSIS — M25551 Pain in right hip: Secondary | ICD-10-CM | POA: Diagnosis not present

## 2022-10-12 DIAGNOSIS — M19071 Primary osteoarthritis, right ankle and foot: Secondary | ICD-10-CM | POA: Diagnosis not present

## 2022-10-12 DIAGNOSIS — Z96653 Presence of artificial knee joint, bilateral: Secondary | ICD-10-CM | POA: Diagnosis not present

## 2022-10-12 DIAGNOSIS — M19042 Primary osteoarthritis, left hand: Secondary | ICD-10-CM

## 2022-10-12 DIAGNOSIS — M19041 Primary osteoarthritis, right hand: Secondary | ICD-10-CM | POA: Diagnosis not present

## 2022-10-12 NOTE — Addendum Note (Signed)
Addended by: Ellen Henri on: 10/12/2022 01:31 PM   Modules accepted: Orders

## 2022-10-13 ENCOUNTER — Other Ambulatory Visit: Payer: Self-pay

## 2022-10-13 ENCOUNTER — Ambulatory Visit: Payer: Medicare Other | Attending: Physician Assistant | Admitting: Physical Therapy

## 2022-10-13 ENCOUNTER — Encounter: Payer: Self-pay | Admitting: Physical Therapy

## 2022-10-13 DIAGNOSIS — R262 Difficulty in walking, not elsewhere classified: Secondary | ICD-10-CM | POA: Insufficient documentation

## 2022-10-13 DIAGNOSIS — M6281 Muscle weakness (generalized): Secondary | ICD-10-CM | POA: Diagnosis not present

## 2022-10-13 DIAGNOSIS — M25551 Pain in right hip: Secondary | ICD-10-CM | POA: Insufficient documentation

## 2022-10-13 DIAGNOSIS — Z96653 Presence of artificial knee joint, bilateral: Secondary | ICD-10-CM | POA: Insufficient documentation

## 2022-10-13 DIAGNOSIS — G8929 Other chronic pain: Secondary | ICD-10-CM

## 2022-10-13 NOTE — Therapy (Signed)
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, skin cnacer   Cancer (HCC)    right breast    Hyperlipidemia    no meds taken   Hypertension    Personal history of radiation therapy 2011   rt breast   Past Surgical History:  Procedure Laterality Date   BREAST DUCTAL SYSTEM EXCISION     right breast   BREAST LUMPECTOMY     CATARACT EXTRACTION     bilateral   CESAREAN SECTION     COLONOSCOPY     ELBOW SURGERY     left   I & D EXTREMITY Right 08/06/2020   Procedure: IRRIGATION AND DEBRIDEMENT OF LEG, wound vac placement;  Surgeon: Nadara Mustard, MD;  Location: [facility] OR;  Service: Orthopedics;  Laterality: Right;   I & D EXTREMITY Right 08/11/2020   Procedure: IRRIGATION AND DEBRIDEMENT EXTREMITY SKIN GRAFT;  Surgeon: Nadara Mustard, MD;  Location: [facility] OR;  Service: Orthopedics;  Laterality: Right;   JOINT REPLACEMENT     KNEE ARTHROPLASTY Left    March 2021   MYOMECTOMY     REPLACEMENT TOTAL KNEE Left 03/2019   TOTAL KNEE ARTHROPLASTY Right 06/26/2018   Procedure: TOTAL KNEE ARTHROPLASTY;  Surgeon: Durene Romans, MD;  Location: [facility] ORS;  Service: Orthopedics;  Laterality: Right;  70 mins   Patient Active Problem List   Diagnosis Date Noted   Ulcer of right leg, with necrosis of muscle (HCC)    Cellulitis of right leg 08/05/2020   Hypokalemia 06/27/2018   Overweight (BMI 25.0-29.9) 06/27/2018   S/P right TKA 06/26/2018   Autoimmune disease (HCC) 03/15/2016   High  risk medication use 03/15/2016   Pain in joint of right shoulder 03/15/2016   Bilateral hip pain 03/15/2016   Primary osteoarthritis of both knees 03/15/2016   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  Some right hip pain and my knees feel heavy.  My daughter wanted me to walk 2 miles a day but I couldn't do it.  Came from a trip to [place], [place] and [place].  Used a cane on the right side to manage cobble stone.  I should have used it on the left side.  I was the caboose, the slowest one!  [facility]; 2x/month goes to stretching, uses bands, weights at [employer]  class PERTINENT HISTORY: Right and left TKR 2020 and 2021 2 years ago had an accident at [facility] no railings descending stairs and fell; Dr Lajoyce Corners using cod fish to heal wound, in hospital 1 week PAIN:   Are you having pain? Yes NPRS scale: 0/10 now with prolonged walking4-5/10 Pain location: right posterior/lateral  Aggravating factors: prolonged walking Relieving factors: Alleve  PRECAUTIONS: None    WEIGHT BEARING RESTRICTIONS: No  FALLS:  Has patient fallen in last 6 months? No  LIVING ENVIRONMENT: Lives with: lives alone Lives in: House/apartment   OCCUPATION: retired  PLOF: Independent  PATIENT GOALS: stronger legs so I can walk faster: next year up the [facility] cruise  OBJECTIVE:  Note: Objective measures were completed at Evaluation unless otherwise noted.  DIAGNOSTIC FINDINGS: x-ray results  pending  PATIENT SURVEYS:  LEFS 53/80  COGNITION: Overall cognitive status: Within functional limits for tasks assessed      MUSCLE LENGTH: Decreased right hip flexor length  PALPATION: Mild tender points right upper gluteals  LOWER EXTREMITY ROM:  WFLs  LOWER EXTREMITY MMT:  Able to rise from standard height chair without UE assist   MMT Right eval Left eval  Hip flexion 4- 4+  Hip extension    Hip abduction 3+ 4  Hip adduction    Hip internal rotation    Hip external rotation 4 4+  Knee flexion 4 4+  Knee extension 4 4+  Ankle dorsiflexion    Ankle plantarflexion    Ankle inversion    Ankle eversion     (Blank rows = not tested)  TRUNK STRENGTH:  Decreased strength abdominals 4-/5;  trunk extensors 4-/5   FUNCTIONAL TESTS:  5x sit to stand no hands: (last 3 reps didn't come all the way up to full standing  13.29 SLS: left 3 sec, right 0 sec with lateral trunk lean and pelvic drop :  415 feet GAIT:  Comments: wider than normal base of support, some lateral deviations side to side; decreased stance time on right No assistive device used   TODAY'S TREATMENT:                                                                                                                              DATE: 10/3    PATIENT EDUCATION:  Education details: Educated patient on anatomy and physiology of current symptoms, prognosis, plan of care as well as initial self care strategies to promote recovery Person educated: Patient Education method: Explanation Education comprehension: verbalized understanding  HOME EXERCISE PROGRAM: Access Code: [email or web address] URL: [email or web address] Date: 10/13/2022 Prepared by: Lavinia Sharps  Exercises - Clamshell  - 1-2 x daily - 7 x weekly - 1 sets - 10 reps - Standing Hip Abduction with Counter Support  - 1-2 x daily - 7 x weekly - 1 sets - 10 reps - Standing Hip Extension with Counter Support  - 1-2 x daily - 7 x weekly - 1  sets -  10 reps - Sit to Stand with Arms Crossed  - 1-2 x daily - 7 x weekly - 1 sets - 10 reps - Single Leg Stance with Support  - 1 x daily - 7 x weekly - 1 sets - 3 reps - as long as possible hold  ASSESSMENT:  CLINICAL IMPRESSION: Patient is a 82 y.o. female who was seen today for physical therapy evaluation and treatment for right hip pain, weakness s/p bil TKR a few years ago and difficulty walking.  The patient reports right lateral hip pain has been present for several months but was quite prominent while she was traveling and walking longer distances.  She also reports that she was frustrated at being the slowest walker in the group and struggled to keep up.  She demonstrates weakness particularly in right gluteals and hip flexors.  Decreased hip flexor length on right and gait deviations including a wide base of support and lateral deviations.  She would benefit from skilled PT to address these limitations and deficits.    OBJECTIVE IMPAIRMENTS: decreased activity tolerance, difficulty walking, decreased strength, impaired perceived functional ability, impaired flexibility, and pain.   ACTIVITY LIMITATIONS: standing, squatting, hygiene/grooming, and locomotion level  PARTICIPATION LIMITATIONS: meal prep, cleaning, laundry, shopping, and community activity  PERSONAL FACTORS: Time since onset of injury/illness/exacerbation and 1-2 comorbidities: history of OA, history of cellulitis   are also affecting patient's functional outcome.   REHAB POTENTIAL: Good  CLINICAL DECISION MAKING: Stable/uncomplicated  EVALUATION COMPLEXITY: Low   GOALS: Goals reviewed with patient? Yes  SHORT TERM GOALS: Target date: 11/10/2022   The patient will demonstrate knowledge of basic self care strategies and exercises to promote healing  Baseline: Goal status: INITIAL  2.  The patient will have improved right hip strength to at least 4/5 needed for standing, walking longer distances and descending  stairs at home and in the community  Baseline:  Goal status: INITIAL  3.  Improved LE strength to rise from a standard chair without UE support 5x in <13 sec without compensations Baseline:  Goal status: INITIAL  4.  The patient will have improved gait stamina and speed needed to ambulate 800 feet in 6 minutes  Baseline:  Goal status: INITIAL   LONG TERM GOALS: Target date: 12/08/2022    The patient will be independent in a safe self progression of a home exercise program to promote further recovery of function  Baseline:  Goal status: INITIAL  2.  The patient will report a 70% improvement in pain levels with functional activities which are currently difficult including standing and walking longer periods of time Baseline:  Goal status: INITIAL  3.  The patient will have improved single leg and narrow base of support balance needed for negotiating uneven pavement safety when in the community and when traveling Baseline:  Goal status: INITIAL  4.  The patient will have improved gait stamina and speed needed to ambulate 1/2 mile for traveling Baseline:  Goal status: INITIAL  5.  LEFS functional outcome score improved to 62/80  Baseline:  Goal status: INITIAL    PLAN:  PT FREQUENCY: 2x/week  PT DURATION: 8 weeks  PLANNED INTERVENTIONS: Therapeutic exercises, Therapeutic activity, Neuromuscular re-education, Balance training, Gait training, Patient/Family education, Self Care, Joint mobilization, Aquatic Therapy, Dry Needling, Electrical stimulation, Spinal mobilization, Cryotherapy, Moist heat, Taping, Ultrasound, Ionotophoresis 4mg /ml Dexamethasone, Manual therapy, and Re-evaluation  PLAN FOR NEXT SESSION: check balance: Dynamic Gait or MiniBest; LE and trunk strengthening; right gluteal, hip flexor and  quad strengthening; right hip flexor lengthening; gait speed  Lavinia Sharps, PT 10/13/22 7:37 PM Phone: [phone] Fax: [phone]

## 2022-10-13 NOTE — Progress Notes (Signed)
Complements WNL

## 2022-10-13 NOTE — Progress Notes (Signed)
CBC WNL ESR WNL Creatinine is borderline elevated and GFR is low-58.  Please advise patient to avoid the use of NSAIDs and increase water intake.  Rest of CMP WNL.

## 2022-10-14 NOTE — Progress Notes (Signed)
dsDNA is negative

## 2022-10-17 ENCOUNTER — Other Ambulatory Visit: Payer: Self-pay | Admitting: Internal Medicine

## 2022-10-17 DIAGNOSIS — Z0000 Encounter for general adult medical examination without abnormal findings: Secondary | ICD-10-CM

## 2022-10-17 LAB — ANTI-NUCLEAR AB-TITER (ANA TITER)
ANA TITER: 1:320 {titer} — ABNORMAL HIGH
ANA Titer 1: 1:80 {titer} — ABNORMAL HIGH

## 2022-10-17 LAB — COMPLETE METABOLIC PANEL WITH GFR
AG Ratio: 1.8 (calc) (ref 1.0–2.5)
ALT: 19 U/L (ref 6–29)
AST: 17 U/L (ref 10–35)
Albumin: 4.3 g/dL (ref 3.6–5.1)
Alkaline phosphatase (APISO): 90 U/L (ref 37–153)
BUN/Creatinine Ratio: 29 (calc) — ABNORMAL HIGH (ref 6–22)
BUN: 28 mg/dL — ABNORMAL HIGH (ref 7–25)
CO2: 25 mmol/L (ref 20–32)
Calcium: 9.5 mg/dL (ref 8.6–10.4)
Chloride: 102 mmol/L (ref 98–110)
Creat: 0.98 mg/dL — ABNORMAL HIGH (ref 0.60–0.95)
Globulin: 2.4 g/dL (ref 1.9–3.7)
Glucose, Bld: 92 mg/dL (ref 65–99)
Potassium: 4.3 mmol/L (ref 3.5–5.3)
Sodium: 139 mmol/L (ref 135–146)
Total Bilirubin: 0.8 mg/dL (ref 0.2–1.2)
Total Protein: 6.7 g/dL (ref 6.1–8.1)
eGFR: 58 mL/min/{1.73_m2} — ABNORMAL LOW (ref >=60)

## 2022-10-17 LAB — ANA: Anti Nuclear Antibody (ANA): POSITIVE — AB

## 2022-10-17 LAB — CBC WITH DIFFERENTIAL/PLATELET
Absolute Monocytes: 728 {cells}/uL (ref 200–950)
Basophils Absolute: 39 {cells}/uL (ref 0–200)
Basophils Relative: 0.7 %
Eosinophils Absolute: 280 {cells}/uL (ref 15–500)
Eosinophils Relative: 5 %
HCT: 44 % (ref 35.0–45.0)
Hemoglobin: 14.5 g/dL (ref 11.7–15.5)
Lymphs Abs: 2078 {cells}/uL (ref 850–3900)
MCH: 28.5 pg (ref 27.0–33.0)
MCHC: 33 g/dL (ref 32.0–36.0)
MCV: 86.4 fL (ref 80.0–100.0)
MPV: 9.7 fL (ref 7.5–12.5)
Monocytes Relative: 13 %
Neutro Abs: 2475 {cells}/uL (ref 1500–7800)
Neutrophils Relative %: 44.2 %
Platelets: 292 10*3/uL (ref 140–400)
RBC: 5.09 10*6/uL (ref 3.80–5.10)
RDW: 14.4 % (ref 11.0–15.0)
Total Lymphocyte: 37.1 %
WBC: 5.6 10*3/uL (ref 3.8–10.8)

## 2022-10-17 LAB — PROTEIN / CREATININE RATIO, URINE

## 2022-10-17 LAB — SEDIMENTATION RATE: Sed Rate: 6 mm/h (ref 0–30)

## 2022-10-17 LAB — ANTI-DNA ANTIBODY, DOUBLE-STRANDED: ds DNA Ab: <1 [IU]/mL

## 2022-10-17 LAB — C3 AND C4
C3 Complement: 157 mg/dL
C4 Complement: 29 mg/dL

## 2022-10-17 NOTE — Progress Notes (Signed)
ANA remains positive--titer unchanged.   Labs are not consistent with flare/active disease.

## 2022-10-18 NOTE — Progress Notes (Signed)
X-rays revealed mild osteoarthritic changes in the right hip.  Degenerative changes were noted in the lumbar spine.  Please notify the patient.

## 2022-10-19 ENCOUNTER — Ambulatory Visit: Payer: Medicare Other | Admitting: Physical Therapy

## 2022-10-19 ENCOUNTER — Encounter: Payer: Self-pay | Admitting: Physical Therapy

## 2022-10-19 DIAGNOSIS — M6281 Muscle weakness (generalized): Secondary | ICD-10-CM

## 2022-10-19 DIAGNOSIS — R262 Difficulty in walking, not elsewhere classified: Secondary | ICD-10-CM

## 2022-10-19 DIAGNOSIS — G8929 Other chronic pain: Secondary | ICD-10-CM

## 2022-10-19 DIAGNOSIS — M25551 Pain in right hip: Secondary | ICD-10-CM

## 2022-10-19 NOTE — Therapy (Signed)
OUTPATIENT PHYSICAL THERAPY LOWER EXTREMITY TREATMENT   Patient Name: Yolanda Peters MRN: [id number] DOB:[birth year], 82 y.o., female Today's Date: 10/19/2022  END OF SESSION:  PT End of Session - 10/19/22 1322     Visit Number 2    Date for PT Re-Evaluation 12/08/22    Authorization Type [employer] Medicare    PT Start Time 0935    PT Stop Time 1013    PT Time Calculation (min) 38 min    Equipment Utilized During Treatment Gait belt    Activity Tolerance Patient tolerated treatment well    Behavior During Therapy WFL for tasks assessed/performed              Past Medical History:  Diagnosis Date   Allergy    Arthritis    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, skin cnacer   Cancer (HCC)    right breast    Hyperlipidemia    no meds taken   Hypertension    Personal history of radiation therapy 2011   rt breast   Past Surgical History:  Procedure Laterality Date   BREAST DUCTAL SYSTEM EXCISION     right breast   BREAST LUMPECTOMY     CATARACT EXTRACTION     bilateral   CESAREAN SECTION     COLONOSCOPY     ELBOW SURGERY     left   I & D EXTREMITY Right 08/06/2020   Procedure: IRRIGATION AND DEBRIDEMENT OF LEG, wound vac placement;  Surgeon: Nadara Mustard, MD;  Location: [facility] OR;  Service: Orthopedics;  Laterality: Right;   I & D EXTREMITY Right 08/11/2020   Procedure: IRRIGATION AND DEBRIDEMENT EXTREMITY SKIN GRAFT;  Surgeon: Nadara Mustard, MD;  Location: [facility] OR;  Service: Orthopedics;  Laterality: Right;   JOINT REPLACEMENT     KNEE ARTHROPLASTY Left    March 2021   MYOMECTOMY     REPLACEMENT TOTAL KNEE Left 03/2019   TOTAL KNEE ARTHROPLASTY Right 06/26/2018   Procedure: TOTAL KNEE ARTHROPLASTY;  Surgeon: Durene Romans, MD;  Location: [facility] OR[facility];  Service: Orthopedics;  Laterality: Right;  70 mins   Patient Active Problem List   Diagnosis Date Noted   Ulcer of right leg, with necrosis of muscle (HCC)    Cellulitis of right leg 08/05/2020   Hypokalemia 06/27/2018    Overweight (BMI 25.0-29.9) 06/27/2018   S/P right TKA 06/26/2018   Autoimmune disease (HCC) 03/15/2016   High risk medication use 03/15/2016   Pain in joint of right shoulder 03/15/2016   Bilateral hip pain 03/15/2016   Primary osteoarthritis of both knees 03/15/2016   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 She is not currently having any hip pain since she took two Tylenols this morning.   Eval: This has been going on several months.  Some right hip pain and my knees feel heavy.  My daughter wanted me to walk 2 miles a day but I couldn't do it.  Came from a trip to [place], [place] and [place].  Used a cane  on the right side to manage cobble stone.  I should have used it on the left side.  I was the caboose, the slowest one!  [facility]; 2x/month goes to stretching, uses bands, weights at [employer] class PERTINENT HISTORY: Right and left TKR 2020 and 2021 2 years ago had an accident at [facility] no railings descending stairs and fell; Dr Lajoyce Corners using cod fish to heal wound, in hospital 1 week PAIN:   Are you having pain? Yes NPRS scale: 0/10 now with prolonged walking4-5/10 Pain location: right posterior/lateral  Aggravating factors: prolonged walking Relieving factors: Alleve  PRECAUTIONS: None    WEIGHT BEARING RESTRICTIONS: No  FALLS:  Has patient fallen in last 6 months? No  LIVING ENVIRONMENT: Lives with: lives alone Lives in: House/apartment   OCCUPATION:  retired  PLOF: Independent  PATIENT GOALS: stronger legs so I can walk faster: next year up the [facility] cruise  OBJECTIVE:  Note: Objective measures were completed at Evaluation unless otherwise noted.  DIAGNOSTIC FINDINGS: x-ray results pending  PATIENT SURVEYS:  LEFS 53/80  COGNITION: Overall cognitive status: Within functional limits for tasks assessed      MUSCLE LENGTH: Decreased right hip flexor length  PALPATION: Mild tender points right upper gluteals  LOWER EXTREMITY ROM:  WFLs  LOWER EXTREMITY MMT:  Able to rise from standard height chair without UE assist   MMT Right eval Left eval  Hip flexion 4- 4+  Hip extension    Hip abduction 3+ 4  Hip adduction    Hip internal rotation    Hip external rotation 4 4+  Knee flexion 4 4+  Knee extension 4 4+  Ankle dorsiflexion    Ankle plantarflexion    Ankle inversion    Ankle eversion     (Blank rows = not tested)  TRUNK STRENGTH:  Decreased strength abdominals 4-/5;  trunk extensors 4-/5   FUNCTIONAL TESTS:  5x sit to stand no hands: (last 3 reps didn't come all the way up to full standing  13.29 SLS: left 3 sec, right 0 sec with lateral trunk lean and pelvic drop :  415 feet DGI 10/19/2022 :16/24 GAIT:  Comments: wider than normal base of support, some lateral deviations side to side; decreased stance time on right No assistive device used   TODAY'S TREATMENT:                                                                                                                              DATE:   10/19/2022  NuStep Level 2 5 minutes Reviewed HEP- moderate verbal cues for form correction with clamshells DGI: 16/24 Seated Chops 3 # DB x 10 each way Step Ups 6 inch step x 10 each Unilateral UE support Sit to stands x 10 no UE support Hurdles (forwards/ backwards) x4 moderate UE support   PATIENT EDUCATION:  Education details: Educated patient on anatomy and physiology of  current symptoms,  prognosis, plan of care as well as initial self care strategies to promote recovery Person educated: Patient Education method: Explanation Education comprehension: verbalized understanding  HOME EXERCISE PROGRAM: Access Code: [email or web address] URL: [email or web address] Date: 10/13/2022 Prepared by: Lavinia Sharps  Exercises - Clamshell  - 1-2 x daily - 7 x weekly - 1 sets - 10 reps - Standing Hip Abduction with Counter Support  - 1-2 x daily - 7 x weekly - 1 sets - 10 reps - Standing Hip Extension with Counter Support  - 1-2 x daily - 7 x weekly - 1 sets - 10 reps - Sit to Stand with Arms Crossed  - 1-2 x daily - 7 x weekly - 1 sets - 10 reps - Single Leg Stance with Support  - 1 x daily - 7 x weekly - 1 sets - 3 reps - as long as possible hold  ASSESSMENT:  CLINICAL IMPRESSION: Today's treatment session focused on balance and LE strengthening. Completed a DGI and patient scored 16/24 indicating an increased falls risk. During the step over obstacle condition patient caught her foot on the object and required the use of stepping strategy and UE support from PT to maintain balance. When turning and head turns noted decreased speed of performance. Patient will benefit from skilled PT to address the below impairments and improve overall function.   OBJECTIVE IMPAIRMENTS: decreased activity tolerance, difficulty walking, decreased strength, impaired perceived functional ability, impaired flexibility, and pain.   ACTIVITY LIMITATIONS: standing, squatting, hygiene/grooming, and locomotion level  PARTICIPATION LIMITATIONS: meal prep, cleaning, laundry, shopping, and community activity  PERSONAL FACTORS: Time since onset of injury/illness/exacerbation and 1-2 comorbidities: history of OA, history of cellulitis   are also affecting patient's functional outcome.   REHAB POTENTIAL: Good  CLINICAL DECISION MAKING: Stable/uncomplicated  EVALUATION COMPLEXITY: Low   GOALS: Goals  reviewed with patient? Yes  SHORT TERM GOALS: Target date: 11/10/2022   The patient will demonstrate knowledge of basic self care strategies and exercises to promote healing  Baseline: Goal status: INITIAL  2.  The patient will have improved right hip strength to at least 4/5 needed for standing, walking longer distances and descending stairs at home and in the community  Baseline:  Goal status: INITIAL  3.  Improved LE strength to rise from a standard chair without UE support 5x in <13 sec without compensations Baseline:  Goal status: INITIAL  4.  The patient will have improved gait stamina and speed needed to ambulate 800 feet in 6 minutes  Baseline:  Goal status: INITIAL   LONG TERM GOALS: Target date: 12/08/2022    The patient will be independent in a safe self progression of a home exercise program to promote further recovery of function  Baseline:  Goal status: INITIAL  2.  The patient will report a 70% improvement in pain levels with functional activities which are currently difficult including standing and walking longer periods of time Baseline:  Goal status: INITIAL  3.  The patient will have improved single leg and narrow base of support balance needed for negotiating uneven pavement safety when in the community and when traveling Baseline:  Goal status: INITIAL  4.  The patient will have improved gait stamina and speed needed to ambulate 1/2 mile for traveling Baseline:  Goal status: INITIAL  5.  LEFS functional outcome score improved to 62/80  Baseline:  Goal status: INITIAL    PLAN:  PT FREQUENCY: 2x/week  PT DURATION: 8 weeks  PLANNED INTERVENTIONS: Therapeutic  exercises, Therapeutic activity, Neuromuscular re-education, Balance training, Gait training, Patient/Family education, Self Care, Joint mobilization, Aquatic Therapy, Dry Needling, Electrical stimulation, Spinal mobilization, Cryotherapy, Moist heat, Taping, Ultrasound, Ionotophoresis  4mg /ml Dexamethasone, Manual therapy, and Re-evaluation  PLAN FOR NEXT SESSION: LE strengthening; balance (turning; stepping over objects)  Claude Manges, PT 10/19/22 1:23 PM

## 2022-10-24 ENCOUNTER — Ambulatory Visit: Payer: Medicare Other | Admitting: Physical Therapy

## 2022-10-24 ENCOUNTER — Encounter: Payer: Self-pay | Admitting: Physical Therapy

## 2022-10-24 DIAGNOSIS — M25551 Pain in right hip: Secondary | ICD-10-CM | POA: Diagnosis not present

## 2022-10-24 DIAGNOSIS — R262 Difficulty in walking, not elsewhere classified: Secondary | ICD-10-CM

## 2022-10-24 DIAGNOSIS — M6281 Muscle weakness (generalized): Secondary | ICD-10-CM

## 2022-10-24 NOTE — Therapy (Signed)
OUTPATIENT PHYSICAL THERAPY LOWER EXTREMITY TREATMENT   Patient Name: Yolanda Peters MRN: [id number] DOB:1940-07-30, 82 y.o., female Today's Date: 10/24/2022  END OF SESSION:  PT End of Session - 10/24/22 1609     Visit Number 3    Date for PT Re-Evaluation 12/08/22    Authorization Type [employer] Medicare    Progress Note Due on Visit 10    PT Start Time 1531    PT Stop Time 1609    PT Time Calculation (min) 38 min    Activity Tolerance Patient tolerated treatment well    Behavior During Therapy WFL for tasks assessed/performed               Past Medical History:  Diagnosis Date   Allergy    Arthritis    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, skin cnacer   Cancer (HCC)    right breast    Hyperlipidemia    no meds taken   Hypertension    Personal history of radiation therapy 2011   rt breast   Past Surgical History:  Procedure Laterality Date   BREAST DUCTAL SYSTEM EXCISION     right breast   BREAST LUMPECTOMY     CATARACT EXTRACTION     bilateral   CESAREAN SECTION     COLONOSCOPY     ELBOW SURGERY     left   I & D EXTREMITY Right 08/06/2020   Procedure: IRRIGATION AND DEBRIDEMENT OF LEG, wound vac placement;  Surgeon: Nadara Mustard, MD;  Location: [facility] OR;  Service: Orthopedics;  Laterality: Right;   I & D EXTREMITY Right 08/11/2020   Procedure: IRRIGATION AND DEBRIDEMENT EXTREMITY SKIN GRAFT;  Surgeon: Nadara Mustard, MD;  Location: [facility] OR;  Service: Orthopedics;  Laterality: Right;   JOINT REPLACEMENT     KNEE ARTHROPLASTY Left    March 2021   MYOMECTOMY     REPLACEMENT TOTAL KNEE Left 03/2019   TOTAL KNEE ARTHROPLASTY Right 06/26/2018   Procedure: TOTAL KNEE ARTHROPLASTY;  Surgeon: Durene Romans, MD;  Location: [facility] ORS;  Service: Orthopedics;  Laterality: Right;  70 mins   Patient Active Problem List   Diagnosis Date Noted   Ulcer of right leg, with necrosis of muscle (HCC)    Cellulitis of right leg 08/05/2020   Hypokalemia 06/27/2018   Overweight  (BMI 25.0-29.9) 06/27/2018   S/P right TKA 06/26/2018   Autoimmune disease (HCC) 03/15/2016   High risk medication use 03/15/2016   Pain in joint of right shoulder 03/15/2016   Bilateral hip pain 03/15/2016   Primary osteoarthritis of both knees 03/15/2016   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 My arthritis is acting up." I feel some weakness in my legs. 2-3/10 pain   Eval: This has been going on several months.  Some right hip pain and my knees feel heavy.  My daughter wanted me to walk 2 miles a day but I couldn't do it.  Came from a trip to [place], [place] and [place].  Used a cane on the  right side to manage cobble stone.  I should have used it on the left side.  I was the caboose, the slowest one!  [facility] 2x/month goes to stretching, uses bands, weights at [employer] class PERTINENT HISTORY: Right and left TKR 2020 and 2021 2 years ago had an accident at [facility] no railings descending stairs and fell; Dr Lajoyce Corners using cod fish to heal wound, in hospital 1 week PAIN:   Are you having pain? Yes NPRS scale: 0/10 now with prolonged walking4-5/10 Pain location: right posterior/lateral  Aggravating factors: prolonged walking Relieving factors: Alleve  PRECAUTIONS: None    WEIGHT BEARING RESTRICTIONS: No  FALLS:  Has patient fallen in last 6 months? No  LIVING ENVIRONMENT: Lives with: lives alone Lives in: House/apartment   OCCUPATION: retired  PLOF: Independent  PATIENT GOALS:  stronger legs so I can walk faster: next year up the [facility] cruise  OBJECTIVE:  Note: Objective measures were completed at Evaluation unless otherwise noted.  DIAGNOSTIC FINDINGS: x-ray results pending  PATIENT SURVEYS:  LEFS 53/80  COGNITION: Overall cognitive status: Within functional limits for tasks assessed      MUSCLE LENGTH: Decreased right hip flexor length  PALPATION: Mild tender points right upper gluteals  LOWER EXTREMITY ROM:  WFLs  LOWER EXTREMITY MMT:  Able to rise from standard height chair without UE assist   MMT Right eval Left eval  Hip flexion 4- 4+  Hip extension    Hip abduction 3+ 4  Hip adduction    Hip internal rotation    Hip external rotation 4 4+  Knee flexion 4 4+  Knee extension 4 4+  Ankle dorsiflexion    Ankle plantarflexion    Ankle inversion    Ankle eversion     (Blank rows = not tested)  TRUNK STRENGTH:  Decreased strength abdominals 4-/5;  trunk extensors 4-/5   FUNCTIONAL TESTS:  5x sit to stand no hands: (last 3 reps didn't come all the way up to full standing  13.29 SLS: left 3 sec, right 0 sec with lateral trunk lean and pelvic drop :  415 feet DGI 10/19/2022 :16/24 GAIT:  Comments: wider than normal base of support, some lateral deviations side to side; decreased stance time on right No assistive device used   TODAY'S TREATMENT:                                                                                                                              DATE:  10/24/2022  NuStep Level 1 6 minutes Standing hip series at barre (hip flexion, hip abduction, and hip extension) 2lb ankle weights 2 x 10 Standing side steps blue loop at barre x 4 Standing Toe Taps with blue loop around thighs (Front, side, back) x 8 each Hip Hike on 2 inch step 2 x 10 each Hurdles (forwards/ backwards) x4 moderate UE support Sit to stands x 10 no  UE support 3# DB Seated Chops 3 # DB x 10 each way Step Ups 6 inch step x 10 each  Unilateral UE support Sit to stands x 10 no UE support Hurdles (forwards/ backwards) x4 moderate UE support Leg Press bilateral 60 lbs 2 x 10 ; unilateral 30 lbs 2 x 10  10/19/2022  NuStep Level 2 5 minutes Reviewed HEP- moderate verbal cues for form correction with clamshells DGI: 16/24 Seated Chops 3 # DB x 10 each way Step Ups 6 inch step x 10 each Unilateral UE support Sit to stands x 10 no UE support Hurdles (forwards/ backwards) x4 moderate UE support   PATIENT EDUCATION:  Education details: Educated patient on anatomy and physiology of current symptoms, prognosis, plan of care as well as initial self care strategies to promote recovery Person educated: Patient Education method: Explanation Education comprehension: verbalized understanding  HOME EXERCISE PROGRAM: Access Code: [email or web address] URL: [email or web address] Date: 10/13/2022 Prepared by: Lavinia Sharps  Exercises - Clamshell  - 1-2 x daily - 7 x weekly - 1 sets - 10 reps - Standing Hip Abduction with Counter Support  - 1-2 x daily - 7 x weekly - 1 sets - 10 reps - Standing Hip Extension with Counter Support  - 1-2 x daily - 7 x weekly - 1 sets - 10 reps - Sit to Stand with Arms Crossed  - 1-2 x daily - 7 x weekly - 1 sets - 10 reps - Single Leg Stance with Support  - 1 x daily - 7 x weekly - 1 sets - 3 reps - as long as possible hold  ASSESSMENT:  CLINICAL IMPRESSION: Today's treatment session focused on LE strengthening and balance. Patient required moderate verbal cues for correct performance of exercises. Noted improved performance of hurdles exercise and patient did not compensate with hip circumduction. Patient was able to tolerate increased weight with standing hip exercises. Patient tolerated treatment session well and did not verbalize any increased pain. With leg press she felt stronger in her Lt leg compared to Rt. Patient will benefit from skilled PT to address the below impairments and improve  overall function.     OBJECTIVE IMPAIRMENTS: decreased activity tolerance, difficulty walking, decreased strength, impaired perceived functional ability, impaired flexibility, and pain.   ACTIVITY LIMITATIONS: standing, squatting, hygiene/grooming, and locomotion level  PARTICIPATION LIMITATIONS: meal prep, cleaning, laundry, shopping, and community activity  PERSONAL FACTORS: Time since onset of injury/illness/exacerbation and 1-2 comorbidities: history of OA, history of cellulitis   are also affecting patient's functional outcome.   REHAB POTENTIAL: Good  CLINICAL DECISION MAKING: Stable/uncomplicated  EVALUATION COMPLEXITY: Low   GOALS: Goals reviewed with patient? Yes  SHORT TERM GOALS: Target date: 11/10/2022   The patient will demonstrate knowledge of basic self care strategies and exercises to promote healing  Baseline: Goal status: INITIAL  2.  The patient will have improved right hip strength to at least 4/5 needed for standing, walking longer distances and descending stairs at home and in the community  Baseline:  Goal status: INITIAL  3.  Improved LE strength to rise from a standard chair without UE support 5x in <13 sec without compensations Baseline:  Goal status: INITIAL  4.  The patient will have improved gait stamina and speed needed to ambulate 800 feet in 6 minutes  Baseline:  Goal status: INITIAL   LONG TERM GOALS: Target date: 12/08/2022    The patient will be independent in a safe self progression of a home exercise program to  promote further recovery of function  Baseline:  Goal status: INITIAL  2.  The patient will report a 70% improvement in pain levels with functional activities which are currently difficult including standing and walking longer periods of time Baseline:  Goal status: INITIAL  3.  The patient will have improved single leg and narrow base of support balance needed for negotiating uneven pavement safety when in the  community and when traveling Baseline:  Goal status: INITIAL  4.  The patient will have improved gait stamina and speed needed to ambulate 1/2 mile for traveling Baseline:  Goal status: INITIAL  5.  LEFS functional outcome score improved to 62/80  Baseline:  Goal status: INITIAL    PLAN:  PT FREQUENCY: 2x/week  PT DURATION: 8 weeks  PLANNED INTERVENTIONS: Therapeutic exercises, Therapeutic activity, Neuromuscular re-education, Balance training, Gait training, Patient/Family education, Self Care, Joint mobilization, Aquatic Therapy, Dry Needling, Electrical stimulation, Spinal mobilization, Cryotherapy, Moist heat, Taping, Ultrasound, Ionotophoresis 4mg /ml Dexamethasone, Manual therapy, and Re-evaluation  PLAN FOR NEXT SESSION: Single leg balance; glute med strengthening   Claude Manges, PT 10/24/22 4:10 PM

## 2022-10-26 ENCOUNTER — Encounter: Payer: Self-pay | Admitting: Physical Therapy

## 2022-10-26 ENCOUNTER — Ambulatory Visit: Payer: Medicare Other | Admitting: Physical Therapy

## 2022-10-26 DIAGNOSIS — M25551 Pain in right hip: Secondary | ICD-10-CM

## 2022-10-26 DIAGNOSIS — M6281 Muscle weakness (generalized): Secondary | ICD-10-CM

## 2022-10-26 DIAGNOSIS — R262 Difficulty in walking, not elsewhere classified: Secondary | ICD-10-CM

## 2022-10-26 DIAGNOSIS — R293 Abnormal posture: Secondary | ICD-10-CM

## 2022-10-26 NOTE — Therapy (Signed)
OUTPATIENT PHYSICAL THERAPY LOWER EXTREMITY TREATMENT   Patient Name: Yolanda Peters MRN: [id number] DOB:1940/07/16, 82 y.o., female Today's Date: 10/26/2022  END OF SESSION:  PT End of Session - 10/26/22 1329     Visit Number 4    Date for PT Re-Evaluation 12/08/22    Authorization Type [employer] Medicare    Progress Note Due on Visit 10    PT Start Time 1232    PT Stop Time 1314    PT Time Calculation (min) 42 min    Activity Tolerance Patient tolerated treatment well    Behavior During Therapy [facility] for tasks assessed/performed                Past Medical History:  Diagnosis Date   Allergy    Arthritis    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, skin cnacer   Cancer (HCC)    right breast    Hyperlipidemia    no meds taken   Hypertension    Personal history of radiation therapy 2011   rt breast   Past Surgical History:  Procedure Laterality Date   BREAST DUCTAL SYSTEM EXCISION     right breast   BREAST LUMPECTOMY     CATARACT EXTRACTION     bilateral   CESAREAN SECTION     COLONOSCOPY     ELBOW SURGERY     left   I & D EXTREMITY Right 08/06/2020   Procedure: IRRIGATION AND DEBRIDEMENT OF LEG, wound vac placement;  Surgeon: Nadara Mustard, MD;  Location: [facility] OR;  Service: Orthopedics;  Laterality: Right;   I & D EXTREMITY Right 08/11/2020   Procedure: IRRIGATION AND DEBRIDEMENT EXTREMITY SKIN GRAFT;  Surgeon: Nadara Mustard, MD;  Location: [facility] OR;  Service: Orthopedics;  Laterality: Right;   JOINT REPLACEMENT     KNEE ARTHROPLASTY Left    March 2021   MYOMECTOMY     REPLACEMENT TOTAL KNEE Left 03/2019   TOTAL KNEE ARTHROPLASTY Right 06/26/2018   Procedure: TOTAL KNEE ARTHROPLASTY;  Surgeon: Durene Romans, MD;  Location: [facility] ORS;  Service: Orthopedics;  Laterality: Right;  70 mins   Patient Active Problem List   Diagnosis Date Noted   Ulcer of right leg, with necrosis of muscle (HCC)    Cellulitis of right leg 08/05/2020   Hypokalemia 06/27/2018   Overweight  (BMI 25.0-29.9) 06/27/2018   S/P right TKA 06/26/2018   Autoimmune disease (HCC) 03/15/2016   High risk medication use 03/15/2016   Pain in joint of right shoulder 03/15/2016   Bilateral hip pain 03/15/2016   Primary osteoarthritis of both knees 03/15/2016   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 She feels a slight ache in her legs, but she is not currently having any pain. She felt good after last treatment session.   Eval: This has been going on several months.  Some right hip pain and my knees feel heavy.  My daughter wanted me to walk 2 miles a day but I couldn't do it.  Came from a  trip to [place], [place] and [place].  Used a cane on the right side to manage cobble stone.  I should have used it on the left side.  I was the caboose, the slowest one!  [facility]; 2x/month goes to stretching, uses bands, weights at [employer] class PERTINENT HISTORY: Right and left TKR 2020 and 2021 2 years ago had an accident at [facility] no railings descending stairs and fell; Dr Lajoyce Corners using cod fish to heal wound, in hospital 1 week PAIN:   Are you having pain? Yes NPRS scale: 0/10 now with prolonged walking4-5/10 Pain location: right posterior/lateral  Aggravating factors: prolonged walking Relieving factors: Alleve  PRECAUTIONS: None    WEIGHT BEARING RESTRICTIONS: No  FALLS:  Has patient fallen in last 6 months? No  LIVING ENVIRONMENT: Lives with: lives alone Lives in:  House/apartment   OCCUPATION: retired  PLOF: Independent  PATIENT GOALS: stronger legs so I can walk faster: next year up the [facility] cruise  OBJECTIVE:  Note: Objective measures were completed at Evaluation unless otherwise noted.  DIAGNOSTIC FINDINGS: x-ray results pending  PATIENT SURVEYS:  LEFS 53/80  COGNITION: Overall cognitive status: Within functional limits for tasks assessed      MUSCLE LENGTH: Decreased right hip flexor length  PALPATION: Mild tender points right upper gluteals  LOWER EXTREMITY ROM:  WFLs  LOWER EXTREMITY MMT:  Able to rise from standard height chair without UE assist   MMT Right eval Left eval  Hip flexion 4- 4+  Hip extension    Hip abduction 3+ 4  Hip adduction    Hip internal rotation    Hip external rotation 4 4+  Knee flexion 4 4+  Knee extension 4 4+  Ankle dorsiflexion    Ankle plantarflexion    Ankle inversion    Ankle eversion     (Blank rows = not tested)  TRUNK STRENGTH:  Decreased strength abdominals 4-/5;  trunk extensors 4-/5   FUNCTIONAL TESTS:  5x sit to stand no hands: (last 3 reps didn't come all the way up to full standing  13.29 SLS: left 3 sec, right 0 sec with lateral trunk lean and pelvic drop :  415 feet DGI 10/19/2022 :16/24 GAIT:  Comments: wider than normal base of support, some lateral deviations side to side; decreased stance time on right No assistive device used   TODAY'S TREATMENT:                                                                                                                              DATE:  10/26/2022  NuStep Level 3 6 minutes Leg Press bilateral 70 lbs 2 x 10 ; unilateral 35 lbs 2 x 10 Standing Toe Taps with blue loop around thighs (Front, side, back) x 8 each Standing hip series on airex (hip flexion, abduction, extension x 10 each Sit to stands x 10 no UE support 3# DB Airex  Step Ups x 10 each Modified tandem on airex 30 sec each Seated Chops 3 # DB x  10 each way Cone Taps (2) x 10 each Tandem on stable surface x 30sec Manual: [place] on Lt hip   10/24/2022  NuStep Level 1 6 minutes Standing hip series at barre (hip flexion, hip abduction, and hip extension) 2lb ankle weights 2 x 10 Standing side steps blue loop at barre x 4 Standing Toe Taps with blue loop around thighs (Front, side, back) x 8 each Hip Hike on 2 inch step 2 x 10 each Hurdles (forwards/ backwards) x4 moderate UE support Sit to stands x 10 no UE support 3# DB Seated Chops 3 # DB x 10 each way Step Ups 6 inch step x 10 each Unilateral UE support Sit to stands x 10 no UE support Hurdles (forwards/ backwards) x4 moderate UE support Leg Press bilateral 60 lbs 2 x 10 ; unilateral 30 lbs 2 x 10  10/19/2022  NuStep Level 2 5 minutes Reviewed HEP- moderate verbal cues for form correction with clamshells DGI: 16/24 Seated Chops 3 # DB x 10 each way Step Ups 6 inch step x 10 each Unilateral UE support Sit to stands x 10 no UE support Hurdles (forwards/ backwards) x4 moderate UE support   PATIENT EDUCATION:  Education details: Educated patient on anatomy and physiology of current symptoms, prognosis, plan of care as well as initial self care strategies to promote recovery Person educated: Patient Education method: Explanation Education comprehension: verbalized understanding  HOME EXERCISE PROGRAM: Access Code: [email or web address] URL: [email or web address] Date: 10/13/2022 Prepared by: Lavinia Sharps  Exercises - Clamshell  - 1-2 x daily - 7 x weekly - 1 sets - 10 reps - Standing Hip Abduction with Counter Support  - 1-2 x daily - 7 x weekly - 1 sets - 10 reps - Standing Hip Extension with Counter Support  - 1-2 x daily - 7 x weekly - 1 sets - 10 reps - Sit to Stand with Arms Crossed  - 1-2 x daily - 7 x weekly - 1 sets - 10 reps - Single Leg Stance with Support  - 1 x daily - 7 x weekly - 1 sets - 3 reps - as long as possible hold  ASSESSMENT:  CLINICAL  IMPRESSION:   Today's treatment session focused on LE strengthening and balance. With modified tandem on airex Lt LE in front was a little more challenging than Rt LE in front. Noted increased sway and use of ankle strategy. Patient tolerated treatment session well and experienced a mild increase in hip pain that responded well to manual therapy. Patient required moderate verbal cues for upright posture and correct form. Patient will benefit from skilled PT to address the below impairments and improve overall function.    OBJECTIVE IMPAIRMENTS: decreased activity tolerance, difficulty walking, decreased strength, impaired perceived functional ability, impaired flexibility, and pain.   ACTIVITY LIMITATIONS: standing, squatting, hygiene/grooming, and locomotion level  PARTICIPATION LIMITATIONS: meal prep, cleaning, laundry, shopping, and community activity  PERSONAL FACTORS: Time since onset of injury/illness/exacerbation and 1-2 comorbidities: history of OA, history of cellulitis   are also affecting patient's functional outcome.   REHAB POTENTIAL: Good  CLINICAL DECISION MAKING: Stable/uncomplicated  EVALUATION COMPLEXITY: Low   GOALS: Goals reviewed with patient? Yes  SHORT TERM GOALS: Target date: 11/10/2022   The patient will demonstrate knowledge of basic self care strategies and exercises to promote healing  Baseline: Goal status: INITIAL  2.  The patient will  have improved right hip strength to at least 4/5 needed for standing, walking longer distances and descending stairs at home and in the community  Baseline:  Goal status: INITIAL  3.  Improved LE strength to rise from a standard chair without UE support 5x in <13 sec without compensations Baseline:  Goal status: INITIAL  4.  The patient will have improved gait stamina and speed needed to ambulate 800 feet in 6 minutes  Baseline:  Goal status: INITIAL   LONG TERM GOALS: Target date: 12/08/2022    The patient  will be independent in a safe self progression of a home exercise program to promote further recovery of function  Baseline:  Goal status: INITIAL  2.  The patient will report a 70% improvement in pain levels with functional activities which are currently difficult including standing and walking longer periods of time Baseline:  Goal status: INITIAL  3.  The patient will have improved single leg and narrow base of support balance needed for negotiating uneven pavement safety when in the community and when traveling Baseline:  Goal status: INITIAL  4.  The patient will have improved gait stamina and speed needed to ambulate 1/2 mile for traveling Baseline:  Goal status: INITIAL  5.  LEFS functional outcome score improved to 62/80  Baseline:  Goal status: INITIAL    PLAN:  PT FREQUENCY: 2x/week  PT DURATION: 8 weeks  PLANNED INTERVENTIONS: Therapeutic exercises, Therapeutic activity, Neuromuscular re-education, Balance training, Gait training, Patient/Family education, Self Care, Joint mobilization, Aquatic Therapy, Dry Needling, Electrical stimulation, Spinal mobilization, Cryotherapy, Moist heat, Taping, Ultrasound, Ionotophoresis 4mg /ml Dexamethasone, Manual therapy, and Re-evaluation  PLAN FOR NEXT SESSION: incorporate single leg balance & continue hip strengthening  Claude Manges, PT 10/26/22 1:30 PM

## 2022-10-31 ENCOUNTER — Telehealth: Payer: Self-pay | Admitting: Physical Therapy

## 2022-10-31 ENCOUNTER — Ambulatory Visit: Payer: Medicare Other | Admitting: Physical Therapy

## 2022-10-31 NOTE — Telephone Encounter (Signed)
Left patient a voicemail about missed appointment on 10/31/2022 at 3:30pm. Reminded patient when next appointment is and left a call back number.  Claude Manges, PT 10/31/22 3:48 PM

## 2022-11-02 ENCOUNTER — Ambulatory Visit: Payer: Medicare Other | Admitting: Physical Therapy

## 2022-11-02 ENCOUNTER — Encounter: Payer: Self-pay | Admitting: Physical Therapy

## 2022-11-02 DIAGNOSIS — M6281 Muscle weakness (generalized): Secondary | ICD-10-CM

## 2022-11-02 DIAGNOSIS — M25551 Pain in right hip: Secondary | ICD-10-CM | POA: Diagnosis not present

## 2022-11-02 DIAGNOSIS — R262 Difficulty in walking, not elsewhere classified: Secondary | ICD-10-CM

## 2022-11-02 NOTE — Therapy (Signed)
OUTPATIENT PHYSICAL THERAPY LOWER EXTREMITY TREATMENT   Patient Name: Yolanda Peters MRN: [id number] DOB:1940-12-16, 82 y.o., female Today's Date: 11/02/2022  END OF SESSION:  PT End of Session - 11/02/22 1102     Visit Number 5    Date for PT Re-Evaluation 12/08/22    Authorization Type [employer] Medicare    Progress Note Due on Visit 10    PT Start Time 1016    PT Stop Time 1058    PT Time Calculation (min) 42 min    Activity Tolerance Patient tolerated treatment well    Behavior During Therapy WFL for tasks assessed/performed                 Past Medical History:  Diagnosis Date   Allergy    Arthritis    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, skin cnacer   Cancer (HCC)    right breast    Hyperlipidemia    no meds taken   Hypertension    Personal history of radiation therapy 2011   rt breast   Past Surgical History:  Procedure Laterality Date   BREAST DUCTAL SYSTEM EXCISION     right breast   BREAST LUMPECTOMY     CATARACT EXTRACTION     bilateral   CESAREAN SECTION     COLONOSCOPY     ELBOW SURGERY     left   I & D EXTREMITY Right 08/06/2020   Procedure: IRRIGATION AND DEBRIDEMENT OF LEG, wound vac placement;  Surgeon: Nadara Mustard, MD;  Location: [facility] OR;  Service: Orthopedics;  Laterality: Right;   I & D EXTREMITY Right 08/11/2020   Procedure: IRRIGATION AND DEBRIDEMENT EXTREMITY SKIN GRAFT;  Surgeon: Nadara Mustard, MD;  Location: [facility];  Service: Orthopedics;  Laterality: Right;   JOINT REPLACEMENT     KNEE ARTHROPLASTY Left    March 2021   MYOMECTOMY     REPLACEMENT TOTAL KNEE Left 03/2019   TOTAL KNEE ARTHROPLASTY Right 06/26/2018   Procedure: TOTAL KNEE ARTHROPLASTY;  Surgeon: Durene Romans, MD;  Location: [facility] ORS;  Service: Orthopedics;  Laterality: Right;  70 mins   Patient Active Problem List   Diagnosis Date Noted   Ulcer of right leg, with necrosis of muscle (HCC)    Cellulitis of right leg 08/05/2020   Hypokalemia 06/27/2018   Overweight  (BMI 25.0-29.9) 06/27/2018   S/P right TKA 06/26/2018   Autoimmune disease (HCC) 03/15/2016   High risk medication use 03/15/2016   Pain in joint of right shoulder 03/15/2016   Bilateral hip pain 03/15/2016   Primary osteoarthritis of both knees 03/15/2016   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 Patient is not currently having any pain.  Eval: This has been going on several months.  Some right hip pain and my knees feel heavy.  My daughter wanted me to walk 2 miles a day but I couldn't do it.  Came from a trip to [place], [place] and [place].  Used a cane on the right side to manage  cobble stone.  I should have used it on the left side.  I was the caboose, the slowest one!  [facility] 2x/month goes to stretching, uses bands, weights at [employer] class PERTINENT HISTORY: Right and left TKR 2020 and 2021 2 years ago had an accident at [facility] no railings descending stairs and fell; Dr Lajoyce Corners using cod fish to heal wound, in hospital 1 week PAIN:   Are you having pain? Yes NPRS scale: 0/10 now with prolonged walking4-5/10 Pain location: right posterior/lateral  Aggravating factors: prolonged walking Relieving factors: Alleve  PRECAUTIONS: None    WEIGHT BEARING RESTRICTIONS: No  FALLS:  Has patient fallen in last 6 months? No  LIVING ENVIRONMENT: Lives with: lives alone Lives in: House/apartment   OCCUPATION: retired  PLOF: Independent  PATIENT GOALS: stronger legs so I  can walk faster: next year up the [facility] cruise  OBJECTIVE:  Note: Objective measures were completed at Evaluation unless otherwise noted.  DIAGNOSTIC FINDINGS: x-ray results pending  PATIENT SURVEYS:  LEFS 53/80  COGNITION: Overall cognitive status: Within functional limits for tasks assessed      MUSCLE LENGTH: Decreased right hip flexor length  PALPATION: Mild tender points right upper gluteals  LOWER EXTREMITY ROM:  WFLs  LOWER EXTREMITY MMT:  Able to rise from standard height chair without UE assist   MMT Right eval Left eval  Hip flexion 4- 4+  Hip extension    Hip abduction 3+ 4  Hip adduction    Hip internal rotation    Hip external rotation 4 4+  Knee flexion 4 4+  Knee extension 4 4+  Ankle dorsiflexion    Ankle plantarflexion    Ankle inversion    Ankle eversion     (Blank rows = not tested)  TRUNK STRENGTH:  Decreased strength abdominals 4-/5;  trunk extensors 4-/5   FUNCTIONAL TESTS:  5x sit to stand no hands: (last 3 reps didn't come all the way up to full standing  13.29 SLS: left 3 sec, right 0 sec with lateral trunk lean and pelvic drop :  415 feet DGI 10/19/2022 :16/24 GAIT:  Comments: wider than normal base of support, some lateral deviations side to side; decreased stance time on right No assistive device used   TODAY'S TREATMENT:                                                                                                                              DATE:  10/26/2022  Recumbent bike 6 minutes - PT present to discuss status Leg Press bilateral 70 lbs 2 x 10 ; unilateral 35 lbs 2 x 10 Step Ups 6 inch step 2 x 10 each Y balance (cone 2 in front; 1 behind) x 10 each Standing hip series (hip flexion, abduction, extension, heel raises) 2.5# ankle weight  x 10 each leg Resisted Walking at cable column  5# x 4 each direction (  back & sideways) Sit to stands 2 x 10 no UE support 3# DB Modified tandem on stable surface x 30sec  each Tandem on stable surface x 30 sec each required finger touch hand hold on barre Modified tandem on airex x 30 sec each required finger touch hand hold on barre Narrow BOS on airex EO 2 x 30 sec required finger touch hand hold on barre    10/24/2022  NuStep Level 1 6 minutes Standing hip series at barre (hip flexion, hip abduction, and hip extension) 2lb ankle weights 2 x 10 Standing side steps blue loop at barre x 4 Standing Toe Taps with blue loop around thighs (Front, side, back) x 8 each Hip Hike on 2 inch step 2 x 10 each Hurdles (forwards/ backwards) x4 moderate UE support Sit to stands x 10 no UE support 3# DB Seated Chops 3 # DB x 10 each way Step Ups 6 inch step x 10 each Unilateral UE support Sit to stands x 10 no UE support Hurdles (forwards/ backwards) x4 moderate UE support Leg Press bilateral 60 lbs 2 x 10 ; unilateral 30 lbs 2 x 10  10/19/2022  NuStep Level 2 5 minutes Reviewed HEP- moderate verbal cues for form correction with clamshells DGI: 16/24 Seated Chops 3 # DB x 10 each way Step Ups 6 inch step x 10 each Unilateral UE support Sit to stands x 10 no UE support Hurdles (forwards/ backwards) x4 moderate UE support   PATIENT EDUCATION:  Education details: Educated patient on anatomy and physiology of current symptoms, prognosis, plan of care as well as initial self care strategies to promote recovery Person educated: Patient Education method: Explanation Education comprehension: verbalized understanding  HOME EXERCISE PROGRAM: Access Code: [email or web address] URL: [email or web address] Date: 10/13/2022 Prepared by: Lavinia Sharps  Exercises - Clamshell  - 1-2 x daily - 7 x weekly - 1 sets - 10 reps - Standing Hip Abduction with Counter Support  - 1-2 x daily - 7 x weekly - 1 sets - 10 reps - Standing Hip Extension with Counter Support  - 1-2 x daily - 7 x weekly - 1 sets - 10 reps - Sit to Stand with Arms Crossed  - 1-2 x daily - 7 x weekly - 1  sets - 10 reps - Single Leg Stance with Support  - 1 x daily - 7 x weekly - 1 sets - 3 reps - as long as possible hold  ASSESSMENT:  CLINICAL IMPRESSION:  Today's treatment session focused on LE strengthening and balance. Patient has not experienced any hip pain since her trip to [place]. Incorporated more single leg activities this treatment session and patient responded well. Since starting therapy patient reports feeling 80% better. She has been doing her home exercises inconsistently but she also goes to exercise classes at the gym. With static balance exercises noted increased sway with eyes closed conditions. Patient will benefit from skilled PT to address the below impairments and improve overall function.     OBJECTIVE IMPAIRMENTS: decreased activity tolerance, difficulty walking, decreased strength, impaired perceived functional ability, impaired flexibility, and pain.   ACTIVITY LIMITATIONS: standing, squatting, hygiene/grooming, and locomotion level  PARTICIPATION LIMITATIONS: meal prep, cleaning, laundry, shopping, and community activity  PERSONAL FACTORS: Time since onset of injury/illness/exacerbation and 1-2 comorbidities: history of OA, history of cellulitis   are also affecting patient's functional outcome.   REHAB POTENTIAL: Good  CLINICAL DECISION MAKING: Stable/uncomplicated  EVALUATION COMPLEXITY: Low   GOALS: Goals reviewed with patient? Yes  SHORT TERM GOALS: Target date: 11/10/2022   The patient will demonstrate knowledge of basic self care strategies and exercises to promote healing  Baseline: Goal status: INITIAL  2.  The patient will have improved right hip strength to at least 4/5 needed for standing, walking longer distances and descending stairs at home and in the community  Baseline:  Goal status: INITIAL  3.  Improved LE strength to rise from a standard chair without UE support 5x in <13 sec without compensations Baseline:  Goal status:  INITIAL  4.  The patient will have improved gait stamina and speed needed to ambulate 800 feet in 6 minutes  Baseline:  Goal status: INITIAL   LONG TERM GOALS: Target date: 12/08/2022    The patient will be independent in a safe self progression of a home exercise program to promote further recovery of function  Baseline:  Goal status: INITIAL  2.  The patient will report a 70% improvement in pain levels with functional activities which are currently difficult including standing and walking longer periods of time Baseline:  Goal status: INITIAL  3.  The patient will have improved single leg and narrow base of support balance needed for negotiating uneven pavement safety when in the community and when traveling Baseline:  Goal status: INITIAL  4.  The patient will have improved gait stamina and speed needed to ambulate 1/2 mile for traveling Baseline:  Goal status: INITIAL  5.  LEFS functional outcome score improved to 62/80  Baseline:  Goal status: INITIAL    PLAN:  PT FREQUENCY: 2x/week  PT DURATION: 8 weeks  PLANNED INTERVENTIONS: Therapeutic exercises, Therapeutic activity, Neuromuscular re-education, Balance training, Gait training, Patient/Family education, Self Care, Joint mobilization, Aquatic Therapy, Dry Needling, Electrical stimulation, Spinal mobilization, Cryotherapy, Moist heat, Taping, Ultrasound, Ionotophoresis 4mg /ml Dexamethasone, Manual therapy, and Re-evaluation  PLAN FOR NEXT SESSION: hip matrix; eyes closed balance; hurdles  Claude Manges, PT 11/02/22 11:02 AM

## 2022-11-07 ENCOUNTER — Ambulatory Visit: Payer: Medicare Other

## 2022-11-07 DIAGNOSIS — M25551 Pain in right hip: Secondary | ICD-10-CM | POA: Diagnosis not present

## 2022-11-07 DIAGNOSIS — R262 Difficulty in walking, not elsewhere classified: Secondary | ICD-10-CM

## 2022-11-07 DIAGNOSIS — M6281 Muscle weakness (generalized): Secondary | ICD-10-CM

## 2022-11-07 NOTE — Therapy (Signed)
OUTPATIENT PHYSICAL THERAPY LOWER EXTREMITY TREATMENT   Patient Name: Yolanda Peters MRN: [id number] DOB:September 28, 1940, 82 y.o., female Today's Date: 11/07/2022  END OF SESSION:  PT End of Session - 11/07/22 1617     Visit Number 6    Date for PT Re-Evaluation 12/08/22    Authorization Type [employer] Medicare    Progress Note Due on Visit 10    PT Start Time 1534    PT Stop Time 1616    PT Time Calculation (min) 42 min    Activity Tolerance Patient tolerated treatment well    Behavior During Therapy WFL for tasks assessed/performed                  Past Medical History:  Diagnosis Date   Allergy    Arthritis    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, skin cnacer   Cancer (HCC)    right breast    Hyperlipidemia    no meds taken   Hypertension    Personal history of radiation therapy 2011   rt breast   Past Surgical History:  Procedure Laterality Date   BREAST DUCTAL SYSTEM EXCISION     right breast   BREAST LUMPECTOMY     CATARACT EXTRACTION     bilateral   CESAREAN SECTION     COLONOSCOPY     ELBOW SURGERY     left   I & D EXTREMITY Right 08/06/2020   Procedure: IRRIGATION AND DEBRIDEMENT OF LEG, wound vac placement;  Surgeon: Nadara Mustard, MD;  Location: [facility] OR;  Service: Orthopedics;  Laterality: Right;   I & D EXTREMITY Right 08/11/2020   Procedure: IRRIGATION AND DEBRIDEMENT EXTREMITY SKIN GRAFT;  Surgeon: Nadara Mustard, MD;  [facility] OR;  Service: Orthopedics;  Laterality: Right;   JOINT REPLACEMENT     KNEE ARTHROPLASTY Left    March 2021   MYOMECTOMY     REPLACEMENT TOTAL KNEE Left 03/2019   TOTAL KNEE ARTHROPLASTY Right 06/26/2018   Procedure: TOTAL KNEE ARTHROPLASTY;  Surgeon: Durene Romans, MD;  Location: [facility] ORS;  Service: Orthopedics;  Laterality: Right;  70 mins   Patient Active Problem List   Diagnosis Date Noted   Ulcer of right leg, with necrosis of muscle (HCC)    Cellulitis of right leg 08/05/2020   Hypokalemia 06/27/2018    Overweight (BMI 25.0-29.9) 06/27/2018   S/P right TKA 06/26/2018   Autoimmune disease (HCC) 03/15/2016   High risk medication use 03/15/2016   Pain in joint of right shoulder 03/15/2016   Bilateral hip pain 03/15/2016   Primary osteoarthritis of both knees 03/15/2016   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    Eval: This has been going on several months.  Some right hip pain and my knees feel heavy.  My daughter wanted me to walk 2 miles a day but I couldn't do it.  Came from a trip to [place], [place] and [place].  Used a cane on the right side to manage  cobble stone.  I should have used it on the left side.  I was the caboose, the slowest one!  [facility]; 2x/month goes to stretching, uses bands, weights at [employer] class PERTINENT HISTORY: Right and left TKR 2020 and 2021 2 years ago had an accident at [facility] no railings descending stairs and fell; Dr Lajoyce Corners using cod fish to heal wound, in hospital 1 week PAIN:   Are you having pain? Yes NPRS scale: 0/10 now with prolonged walking4-5/10 Pain location: right posterior/lateral  Aggravating factors: prolonged walking Relieving factors: Alleve  PRECAUTIONS: None    WEIGHT BEARING RESTRICTIONS: No  FALLS:  Has patient fallen in last 6 months? No  LIVING ENVIRONMENT: Lives with: lives alone Lives in: House/apartment   OCCUPATION: retired  PLOF: Independent  PATIENT GOALS: stronger legs so I can walk faster:  next year up the [facility] cruise  OBJECTIVE:  Note: Objective measures were completed at Evaluation unless otherwise noted.  DIAGNOSTIC FINDINGS: x-ray results pending  PATIENT SURVEYS:  LEFS 53/80  COGNITION: Overall cognitive status: Within functional limits for tasks assessed      MUSCLE LENGTH: Decreased right hip flexor length  PALPATION: Mild tender points right upper gluteals  LOWER EXTREMITY ROM:  WFLs  LOWER EXTREMITY MMT:  Able to rise from standard height chair without UE assist   MMT Right eval Left eval  Hip flexion 4- 4+  Hip extension    Hip abduction 3+ 4  Hip adduction    Hip internal rotation    Hip external rotation 4 4+  Knee flexion 4 4+  Knee extension 4 4+  Ankle dorsiflexion    Ankle plantarflexion    Ankle inversion    Ankle eversion     (Blank rows = not tested)  TRUNK STRENGTH:  Decreased strength abdominals 4-/5;  trunk extensors 4-/5   FUNCTIONAL TESTS:  5x sit to stand no hands: (last 3 reps didn't come all the way up to full standing  13.29 SLS: left 3 sec, right 0 sec with lateral trunk lean and pelvic drop :  415 feet DGI 10/19/2022 :16/24 GAIT:  Comments: wider than normal base of support, some lateral deviations side to side; decreased stance time on right No assistive device used   TODAY'S TREATMENT:                                                                                                                              DATE:  11/07/2022  NuStep Level 3x 6 minutes- PT present to discuss status Leg Press bilateral 70 lbs 2 x 10 ; unilateral 35 lbs 2 x 10 Step Ups 6 inch step 2 x 10 each Standing hip series (hip flexion, abduction, extension, heel raises) standing on balance pad  2x10 bil Sit to stands 2 x 10 no UE support 5# kettlebell Modified tandem on stable surface x 30sec each Hurdles: forward with step-to,  sidestepping with step-to gait Modified tandem on airex x 30 sec each required finger touch hand  hold on barre Narrow BOS on airex EO 2 x 30 sec required finger touch hand hold on barre  10/26/2022  Recumbent bike 6 minutes - PT present to discuss status Leg Press bilateral 70 lbs 2 x 10 ; unilateral 35 lbs 2 x 10 Step Ups 6 inch step 2 x 10 each Y balance (cone 2 in front; 1 behind) x 10 each Standing hip series (hip flexion, abduction, extension, heel raises) 2.5# ankle weight  x 10 each leg Resisted Walking at cable column  5# x 4 each direction (back & sideways) Sit to stands 2 x 10 no UE support 3# DB Modified tandem on stable surface x 30sec each Tandem on stable surface x 30 sec each required finger touch hand hold on barre Modified tandem on airex x 30 sec each required finger touch hand hold on barre Narrow BOS on airex EO 2 x 30 sec required finger touch hand hold on barre  10/24/2022  NuStep Level 1 6 minutes Standing hip series at barre (hip flexion, hip abduction, and hip extension) 2lb ankle weights 2 x 10 Standing side steps blue loop at barre x 4 Standing Toe Taps with blue loop around thighs (Front, side, back) x 8 each Hip Hike on 2 inch step 2 x 10 each Hurdles (forwards/ backwards) x4 moderate UE support Sit to stands x 10 no UE support 3# DB Seated Chops 3 # DB x 10 each way Step Ups 6 inch step x 10 each Unilateral UE support Sit to stands x 10 no UE support Hurdles (forwards/ backwards) x4 moderate UE support Leg Press bilateral 60 lbs 2 x 10 ; unilateral 30 lbs 2 x 10  PATIENT EDUCATION:  Education details: Educated patient on anatomy and physiology of current symptoms, prognosis, plan of care as well as initial self care strategies to promote recovery Person educated: Patient Education method: Explanation Education comprehension: verbalized understanding  HOME EXERCISE PROGRAM: Access Code: [email or web address] URL: [email or web address] Date: 10/13/2022 Prepared by: Lavinia Sharps  Exercises - Clamshell  - 1-2 x daily - 7 x weekly - 1 sets -  10 reps - Standing Hip Abduction with Counter Support  - 1-2 x daily - 7 x weekly - 1 sets - 10 reps - Standing Hip Extension with Counter Support  - 1-2 x daily - 7 x weekly - 1 sets - 10 reps - Sit to Stand with Arms Crossed  - 1-2 x daily - 7 x weekly - 1 sets - 10 reps - Single Leg Stance with Support  - 1 x daily - 7 x weekly - 1 sets - 3 reps - as long as possible hold  ASSESSMENT:  CLINICAL IMPRESSION:  Pt denies any pain over the past couple of weeks.  Pt reports minimal compliance with HEP and goes to [employer] and [employer] for pool and land classes. Pt requires min UE support with balance tasks on balance pad.  Good stability with hurdles with min to no UE support.  Pt is able to participate in all exercises in clinic without increased pain or difficulty.     OBJECTIVE IMPAIRMENTS: decreased activity tolerance, difficulty walking, decreased strength, impaired perceived functional ability, impaired flexibility, and pain.   ACTIVITY LIMITATIONS: standing, squatting, hygiene/grooming, and locomotion level  PARTICIPATION LIMITATIONS: meal prep, cleaning, laundry, shopping, and community activity  PERSONAL FACTORS: Time since onset of injury/illness/exacerbation and 1-2 comorbidities: history  of OA, history of cellulitis   are also affecting patient's functional outcome.   REHAB POTENTIAL: Good  CLINICAL DECISION MAKING: Stable/uncomplicated  EVALUATION COMPLEXITY: Low   GOALS: Goals reviewed with patient? Yes  SHORT TERM GOALS: Target date: 11/10/2022   The patient will demonstrate knowledge of basic self care strategies and exercises to promote healing  Baseline: Goal status: INITIAL  2.  The patient will have improved right hip strength to at least 4/5 needed for standing, walking longer distances and descending stairs at home and in the community  Baseline:  Goal status: INITIAL  3.  Improved LE strength to rise from a standard chair without UE support 5x in <13 sec  without compensations Baseline:  Goal status: INITIAL  4.  The patient will have improved gait stamina and speed needed to ambulate 800 feet in 6 minutes  Baseline:  Goal status: INITIAL   LONG TERM GOALS: Target date: 12/08/2022    The patient will be independent in a safe self progression of a home exercise program to promote further recovery of function  Baseline:  Goal status: INITIAL  2.  The patient will report a 70% improvement in pain levels with functional activities which are currently difficult including standing and walking longer periods of time Baseline:  Goal status: INITIAL  3.  The patient will have improved single leg and narrow base of support balance needed for negotiating uneven pavement safety when in the community and when traveling Baseline:  Goal status: INITIAL  4.  The patient will have improved gait stamina and speed needed to ambulate 1/2 mile for traveling Baseline:  Goal status: INITIAL  5.  LEFS functional outcome score improved to 62/80  Baseline:  Goal status: INITIAL    PLAN:  PT FREQUENCY: 2x/week  PT DURATION: 8 weeks  PLANNED INTERVENTIONS: Therapeutic exercises, Therapeutic activity, Neuromuscular re-education, Balance training, Gait training, Patient/Family education, Self Care, Joint mobilization, Aquatic Therapy, Dry Needling, Electrical stimulation, Spinal mobilization, Cryotherapy, Moist heat, Taping, Ultrasound, Ionotophoresis 4mg /ml Dexamethasone, Manual therapy, and Re-evaluation  PLAN FOR NEXT SESSION: hip matrix; eyes closed balance; hurdles  Lorrene Reid, PT 11/07/22 4:17 PM

## 2022-11-09 ENCOUNTER — Ambulatory Visit: Payer: Medicare Other | Admitting: Physical Therapy

## 2022-11-09 ENCOUNTER — Encounter: Payer: Self-pay | Admitting: Physical Therapy

## 2022-11-09 DIAGNOSIS — M25551 Pain in right hip: Secondary | ICD-10-CM | POA: Diagnosis not present

## 2022-11-09 DIAGNOSIS — M6281 Muscle weakness (generalized): Secondary | ICD-10-CM

## 2022-11-09 DIAGNOSIS — R262 Difficulty in walking, not elsewhere classified: Secondary | ICD-10-CM

## 2022-11-09 NOTE — Therapy (Signed)
OUTPATIENT PHYSICAL THERAPY LOWER EXTREMITY TREATMENT   Patient Name: Yolanda Peters MRN: [id number] DOB:Aug 30, 1940, 82 y.o., female Today's Date: 11/09/2022  END OF SESSION:  PT End of Session - 11/09/22 1316     Visit Number 7    Date for PT Re-Evaluation 12/08/22    Authorization Type [employer] Medicare    Progress Note Due on Visit 10    PT Start Time 1231    PT Stop Time 1310    PT Time Calculation (min) 39 min    Activity Tolerance Patient tolerated treatment well    Behavior During Therapy WFL for tasks assessed/performed                   Past Medical History:  Diagnosis Date   Allergy    Arthritis    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, skin cnacer   Cancer (HCC)    right breast    Hyperlipidemia    no meds taken   Hypertension    Personal history of radiation therapy 2011   rt breast   Past Surgical History:  Procedure Laterality Date   BREAST DUCTAL SYSTEM EXCISION     right breast   BREAST LUMPECTOMY     CATARACT EXTRACTION     bilateral   CESAREAN SECTION     COLONOSCOPY     ELBOW SURGERY     left   I & D EXTREMITY Right 08/06/2020   Procedure: IRRIGATION AND DEBRIDEMENT OF LEG, wound vac placement;  Surgeon: Nadara Mustard, MD;  Location: [facility] OR;  Service: Orthopedics;  Laterality: Right;   I & D EXTREMITY Right 08/11/2020   Procedure: IRRIGATION AND DEBRIDEMENT EXTREMITY SKIN GRAFT;  Surgeon: Nadara Mustard, MD;  Location: [facility] OR;  Service: Orthopedics;  Laterality: Right;   JOINT REPLACEMENT     KNEE ARTHROPLASTY Left    March 2021   MYOMECTOMY     REPLACEMENT TOTAL KNEE Left 03/2019   TOTAL KNEE ARTHROPLASTY Right 06/26/2018   Procedure: TOTAL KNEE ARTHROPLASTY;  Surgeon: Durene Romans, MD;  Location: [facility] ORS;  Service: Orthopedics;  Laterality: Right;  70 mins   Patient Active Problem List   Diagnosis Date Noted   Ulcer of right leg, with necrosis of muscle (HCC)    Cellulitis of right leg 08/05/2020   Hypokalemia 06/27/2018    Overweight (BMI 25.0-29.9) 06/27/2018   S/P right TKA 06/26/2018   Autoimmune disease (HCC) 03/15/2016   High risk medication use 03/15/2016   Pain in joint of right shoulder 03/15/2016   Bilateral hip pain 03/15/2016   Primary osteoarthritis of both knees 03/15/2016   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 My arthritis has not been acting up.  Eval: This has been going on several months.  Some right hip pain and my knees feel heavy.  My daughter wanted me to walk 2 miles a day but I couldn't do it.  Came from a trip to [place], [place] and [place].  Used a cane on the right side to manage  cobble stone.  I should have used it on the left side.  I was the caboose, the slowest one!  [facility] 2x/month goes to stretching, uses bands, weights at [employer] class PERTINENT HISTORY: Right and left TKR 2020 and 2021 2 years ago had an accident at [facility] no railings descending stairs and fell; Dr Lajoyce Corners using cod fish to heal wound, in hospital 1 week PAIN:   Are you having pain? Yes NPRS scale: 0/10 now with prolonged walking4-5/10 Pain location: right posterior/lateral  Aggravating factors: prolonged walking Relieving factors: Alleve  PRECAUTIONS: None    WEIGHT BEARING RESTRICTIONS: No  FALLS:  Has patient fallen in last 6 months? No  LIVING ENVIRONMENT: Lives with: lives alone Lives in: House/apartment   OCCUPATION: retired  PLOF: Independent  PATIENT GOALS: stronger legs so I can walk  faster: next year up the [facility] cruise  OBJECTIVE:  Note: Objective measures were completed at Evaluation unless otherwise noted.  DIAGNOSTIC FINDINGS: x-ray results pending  PATIENT SURVEYS:  LEFS 53/80  COGNITION: Overall cognitive status: Within functional limits for tasks assessed      MUSCLE LENGTH: Decreased right hip flexor length  PALPATION: Mild tender points right upper gluteals  LOWER EXTREMITY ROM:  WFLs  LOWER EXTREMITY MMT:  Able to rise from standard height chair without UE assist   MMT Right eval Left eval  Hip flexion 4- 4+  Hip extension    Hip abduction 3+ 4  Hip adduction    Hip internal rotation    Hip external rotation 4 4+  Knee flexion 4 4+  Knee extension 4 4+  Ankle dorsiflexion    Ankle plantarflexion    Ankle inversion    Ankle eversion     (Blank rows = not tested)  TRUNK STRENGTH:  Decreased strength abdominals 4-/5;  trunk extensors 4-/5   FUNCTIONAL TESTS:  5x sit to stand no hands: (last 3 reps didn't come all the way up to full standing  13.29 SLS: left 3 sec, right 0 sec with lateral trunk lean and pelvic drop :  415 feet DGI 10/19/2022 :16/24 GAIT:  Comments: wider than normal base of support, some lateral deviations side to side; decreased stance time on right No assistive device used   TODAY'S TREATMENT:                                                                                                                              DATE:  11/09/2022  NuStep Level 4 x 6 minutes- PT present to discuss status Resisted Walking 5# (backwards; sideways) x 4 each direction Leg Press bilateral 70 lbs 2 x 10 ; unilateral 35 lbs 2 x 10 Standing hip series (hip flexion, abduction, extension, heel raises) standing on balance pad  x 10 each leg Sit to stands 2 x 10 no UE support 5# kettlebell Hurdles: forward with step-to, sidestepping with step-to gait x  4 each Modified tandem on airex x 30 sec each required finger touch  hand hold on barre Narrow BOS on stable surface EC 2 x 30sec Narrow BOS on airex EC 2 x 30 sec - very challenging required frequent use of UE support and opening eyes PT guarding  11/07/2022  NuStep Level 3x 6 minutes- PT present to discuss status Leg Press bilateral 70 lbs 2 x 10 ; unilateral 35 lbs 2 x 10 Step Ups 6 inch step 2 x 10 each Standing hip series (hip flexion, abduction, extension, heel raises) standing on balance pad  2x10 bil Sit to stands 2 x 10 no UE support 5# kettlebell Modified tandem on stable surface x 30sec each Hurdles: forward with step-to, sidestepping with step-to gait Modified tandem on airex x 30 sec each required finger touch hand hold on barre Narrow BOS on airex EO 2 x 30 sec required finger touch hand hold on barre  10/26/2022  Recumbent bike 6 minutes - PT present to discuss status Leg Press bilateral 70 lbs 2 x 10 ; unilateral 35 lbs 2 x 10 Step Ups 6 inch step 2 x 10 each Y balance (cone 2 in front; 1 behind) x 10 each Standing hip series (hip flexion, abduction, extension, heel raises) 2.5# ankle weight  x 10 each leg Resisted Walking at cable column  5# x 4 each direction (back & sideways) Sit to stands 2 x 10 no UE support 3# DB Modified tandem on stable surface x 30sec each Tandem on stable surface x 30 sec each required finger touch hand hold on barre Modified tandem on airex x 30 sec each required finger touch hand hold on barre Narrow BOS on airex EO 2 x 30 sec required finger touch hand hold on barre   PATIENT EDUCATION:  Education details: Educated patient on anatomy and physiology of current symptoms, prognosis, plan of care as well as initial self care strategies to promote recovery Person educated: Patient Education method: Explanation Education comprehension: verbalized understanding  HOME EXERCISE PROGRAM: Access Code: [email or web address] URL: [email or web address] Date: 10/13/2022 Prepared by: Lavinia Sharps  Exercises -  Clamshell  - 1-2 x daily - 7 x weekly - 1 sets - 10 reps - Standing Hip Abduction with Counter Support  - 1-2 x daily - 7 x weekly - 1 sets - 10 reps - Standing Hip Extension with Counter Support  - 1-2 x daily - 7 x weekly - 1 sets - 10 reps - Sit to Stand with Arms Crossed  - 1-2 x daily - 7 x weekly - 1 sets - 10 reps - Single Leg Stance with Support  - 1 x daily - 7 x weekly - 1 sets - 3 reps - as long as possible hold  ASSESSMENT:  CLINICAL IMPRESSION:  Today's treatment session focused on LE strengthening and balance. Incorporated resisted walking this treatment session and patient need not require any UE support. With eyes closed on stable surface noted increased sway and use of ankle strategy. Eyes closed on airex was challenging for patient and required frequent use of UE support to maintain balance. Noted some shortness of breath after exercises but patient did not verbalize fatigue. Patient will benefit from skilled PT to address the below impairments and improve overall function.    OBJECTIVE IMPAIRMENTS: decreased activity tolerance, difficulty walking, decreased strength, impaired perceived functional ability, impaired flexibility, and pain.   ACTIVITY LIMITATIONS: standing, squatting, hygiene/grooming, and locomotion level  PARTICIPATION LIMITATIONS: meal prep, cleaning, laundry, shopping,  and community activity  PERSONAL FACTORS: Time since onset of injury/illness/exacerbation and 1-2 comorbidities: history of OA, history of cellulitis   are also affecting patient's functional outcome.   REHAB POTENTIAL: Good  CLINICAL DECISION MAKING: Stable/uncomplicated  EVALUATION COMPLEXITY: Low   GOALS: Goals reviewed with patient? Yes  SHORT TERM GOALS: Target date: 11/10/2022   The patient will demonstrate knowledge of basic self care strategies and exercises to promote healing  Baseline: Goal status: INITIAL  2.  The patient will have improved right hip strength to at  least 4/5 needed for standing, walking longer distances and descending stairs at home and in the community  Baseline:  Goal status: INITIAL  3.  Improved LE strength to rise from a standard chair without UE support 5x in <13 sec without compensations Baseline:  Goal status: INITIAL  4.  The patient will have improved gait stamina and speed needed to ambulate 800 feet in 6 minutes  Baseline:  Goal status: INITIAL   LONG TERM GOALS: Target date: 12/08/2022    The patient will be independent in a safe self progression of a home exercise program to promote further recovery of function  Baseline:  Goal status: INITIAL  2.  The patient will report a 70% improvement in pain levels with functional activities which are currently difficult including standing and walking longer periods of time Baseline:  Goal status: INITIAL  3.  The patient will have improved single leg and narrow base of support balance needed for negotiating uneven pavement safety when in the community and when traveling Baseline:  Goal status: INITIAL  4.  The patient will have improved gait stamina and speed needed to ambulate 1/2 mile for traveling Baseline:  Goal status: INITIAL  5.  LEFS functional outcome score improved to 62/80  Baseline:  Goal status: INITIAL    PLAN:  PT FREQUENCY: 2x/week  PT DURATION: 8 weeks  PLANNED INTERVENTIONS: Therapeutic exercises, Therapeutic activity, Neuromuscular re-education, Balance training, Gait training, Patient/Family education, Self Care, Joint mobilization, Aquatic Therapy, Dry Needling, Electrical stimulation, Spinal mobilization, Cryotherapy, Moist heat, Taping, Ultrasound, Ionotophoresis 4mg /ml Dexamethasone, Manual therapy, and Re-evaluation  PLAN FOR NEXT SESSION: hip matrix; single leg balance  Claude Manges, PT 11/09/22 1:16 PM

## 2022-11-11 ENCOUNTER — Encounter: Payer: Medicare Other | Admitting: Physical Therapy

## 2022-11-14 ENCOUNTER — Ambulatory Visit: Payer: Medicare Other | Attending: Physician Assistant | Admitting: Physical Therapy

## 2022-11-14 ENCOUNTER — Encounter: Payer: Self-pay | Admitting: Physical Therapy

## 2022-11-14 DIAGNOSIS — M6281 Muscle weakness (generalized): Secondary | ICD-10-CM | POA: Diagnosis present

## 2022-11-14 DIAGNOSIS — R262 Difficulty in walking, not elsewhere classified: Secondary | ICD-10-CM | POA: Diagnosis present

## 2022-11-14 DIAGNOSIS — M25551 Pain in right hip: Secondary | ICD-10-CM | POA: Diagnosis present

## 2022-11-14 NOTE — Therapy (Addendum)
OUTPATIENT PHYSICAL THERAPY LOWER EXTREMITY TREATMENT   Patient Name: Yolanda Peters MRN: [id number] DOB:[birth year], 82 y.o., female Today's Date: 11/14/2022  END OF SESSION:  PT End of Session - 11/14/22 1018     Visit Number 8    Date for PT Re-Evaluation 12/08/22    Authorization Type [employer] Medicare    Progress Note Due on Visit 10    PT Start Time 0933    PT Stop Time 1016    PT Time Calculation (min) 43 min    Activity Tolerance Patient tolerated treatment well    Behavior During Therapy [facility] for tasks assessed/performed                    Past Medical History:  Diagnosis Date   Allergy    Arthritis    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, skin cnacer   Cancer (HCC)    right breast    Hyperlipidemia    no meds taken   Hypertension    Personal history of radiation therapy 2011   rt breast   Past Surgical History:  Procedure Laterality Date   BREAST DUCTAL SYSTEM EXCISION     right breast   BREAST LUMPECTOMY     CATARACT EXTRACTION     bilateral   CESAREAN SECTION     COLONOSCOPY     ELBOW SURGERY     left   I & D EXTREMITY Right 08/06/2020   Procedure: IRRIGATION AND DEBRIDEMENT OF LEG, wound vac placement;  Surgeon: Nadara Mustard, MD;  Location: [facility] OR;  Service: Orthopedics;  Laterality: Right;   I & D EXTREMITY Right 08/11/2020   Procedure: IRRIGATION AND DEBRIDEMENT EXTREMITY SKIN GRAFT;  Surgeon: Nadara Mustard, MD;  Location: [facility] OR;  Service: Orthopedics;  Laterality: Right;   JOINT REPLACEMENT     KNEE ARTHROPLASTY Left    March 2021   MYOMECTOMY     REPLACEMENT TOTAL KNEE Left 03/2019   TOTAL KNEE ARTHROPLASTY Right 06/26/2018   Procedure: TOTAL KNEE ARTHROPLASTY;  Surgeon: Durene Romans, MD;  Location: [facility] ORS;  Service: Orthopedics;  Laterality: Right;  70 mins   Patient Active Problem List   Diagnosis Date Noted   Ulcer of right leg, with necrosis of muscle (HCC)    Cellulitis of right leg 08/05/2020   Hypokalemia 06/27/2018    Overweight (BMI 25.0-29.9) 06/27/2018   S/P right TKA 06/26/2018   Autoimmune disease (HCC) 03/15/2016   High risk medication use 03/15/2016   Pain in joint of right shoulder 03/15/2016   Bilateral hip pain 03/15/2016   Primary osteoarthritis of both knees 03/15/2016   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 The top of my foot is hurting I think it is my arthritis acting up.  Eval: This has been going on several months.  Some right hip pain and my knees feel heavy.  My daughter wanted me to walk 2 miles a day but I couldn't do it.  Came from a trip to [place], [place] and [place].  Used a cane on the right side to manage cobble stone.  I should have used it on the left side.  I was the caboose, the slowest one!  [facility] 2x/month goes to stretching, uses bands, weights at [employer] class PERTINENT HISTORY: Right and left TKR 2020 and 2021 2 years ago had an accident at [facility] no railings descending stairs and fell; Dr Lajoyce Corners using cod fish to heal wound, in hospital 1 week PAIN:   Are you having pain? Yes NPRS scale: 0/10 now with prolonged walking4-5/10 Pain location: right posterior/lateral  Aggravating factors: prolonged walking Relieving factors: Alleve  PRECAUTIONS: None    WEIGHT BEARING RESTRICTIONS: No  FALLS:  Has patient fallen in last 6 months? No  LIVING ENVIRONMENT: Lives with: lives alone Lives in: House/apartment   OCCUPATION: retired  PLOF: Independent  PATIENT GOALS:  stronger legs so I can walk faster: next year up the [facility] cruise  OBJECTIVE:  Note: Objective measures were completed at Evaluation unless otherwise noted.  DIAGNOSTIC FINDINGS: x-ray results pending  PATIENT SURVEYS:  LEFS 53/80  COGNITION: Overall cognitive status: Within functional limits for tasks assessed      MUSCLE LENGTH: Decreased right hip flexor length  PALPATION: Mild tender points right upper gluteals  LOWER EXTREMITY ROM:  WFLs  LOWER EXTREMITY MMT:  Able to rise from standard height chair without UE assist   MMT Right eval Left eval  Hip flexion 4- 4+  Hip extension    Hip abduction 3+ 4  Hip adduction    Hip internal rotation    Hip external rotation 4 4+  Knee flexion 4 4+  Knee extension 4 4+  Ankle dorsiflexion    Ankle plantarflexion    Ankle inversion    Ankle eversion     (Blank rows = not tested)  TRUNK STRENGTH:  Decreased strength abdominals 4-/5;  trunk extensors 4-/5   FUNCTIONAL TESTS:  5x sit to stand no hands: (last 3 reps didn't come all the way up to full standing  13.29 SLS: left 3 sec, right 0 sec with lateral trunk lean and pelvic drop :  415 feet DGI 10/19/2022 :16/24 GAIT:  Comments: wider than normal base of support, some lateral deviations side to side; decreased stance time on right No assistive device used   TODAY'S TREATMENT:                                                                                                                              DATE:  11/14/2022  NuStep Level 4 x 6 minutes- PT present to discuss status Resisted Walking 5# (backwards; sideways) x 5 each direction- backwards caused increased hip pain Leg Press bilateral 70 lbs 3 x 10 ; unilateral 40 lbs 2 x 10 Y Balance x 8 each unilateral UE support Narrow BOS on stable surface EC 2 x 30sec Tandem on stable surface x 30 sec-  finger touch support Attempted STS with 5lb weight but patient had a sharp hip pain and cramping Hamstring   stretch on 2nd stair 2 x 30 sec Rt  Hip flexor stretch on 2nd stair 2 x 30sec Rt Manual: addaday to Rt lateral hip musculature to increase circulation. PT present to monitor status.   11/09/2022  NuStep Level 4 x 6 minutes- PT present to discuss status Resisted Walking 5# (backwards; sideways) x 4 each direction Leg Press bilateral 70 lbs 2 x 10 ; unilateral 35 lbs 2 x 10 Standing hip series (hip flexion, abduction, extension, heel raises) standing on balance pad  x 10 each leg Sit to stands 2 x 10 no UE support 5# kettlebell Hurdles: forward with step-to, sidestepping with step-to gait x 4 each Modified tandem on airex x 30 sec each required finger touch hand hold on barre Narrow BOS on stable surface EC 2 x 30sec Narrow BOS on airex EC 2 x 30 sec - very challenging required frequent use of UE support and opening eyes PT guarding  11/07/2022  NuStep Level 3x 6 minutes- PT present to discuss status Leg Press bilateral 70 lbs 2 x 10 ; unilateral 35 lbs 2 x 10 Step Ups 6 inch step 2 x 10 each Standing hip series (hip flexion, abduction, extension, heel raises) standing on balance pad  2x10 bil Sit to stands 2 x 10 no UE support 5# kettlebell Modified tandem on stable surface x 30sec each Hurdles: forward with step-to, sidestepping with step-to gait Modified tandem on airex x 30 sec each required finger touch hand hold on barre Narrow BOS on airex EO 2 x 30 sec required finger touch hand hold on barre   PATIENT EDUCATION:  Education details: Educated patient on anatomy and physiology of current symptoms, prognosis, plan of care as well as initial self care strategies to promote recovery Person educated: Patient Education method: Explanation Education comprehension: verbalized understanding  HOME EXERCISE PROGRAM: Access Code: [email or web address] URL: [email or web address] Date: 10/13/2022 Prepared by: Lavinia Sharps  Exercises - Clamshell  - 1-2 x daily - 7 x weekly - 1 sets -  10 reps - Standing Hip Abduction with Counter Support  - 1-2 x daily - 7 x weekly - 1 sets - 10 reps - Standing Hip Extension with Counter Support  - 1-2 x daily - 7 x weekly - 1 sets - 10 reps - Sit to Stand with Arms Crossed  - 1-2 x daily - 7 x weekly - 1 sets - 10 reps - Single Leg Stance with Support  - 1 x daily - 7 x weekly - 1 sets - 3 reps - as long as possible hold  ASSESSMENT:  CLINICAL IMPRESSION:  Today's treatment session focused on LE strengthening and balance. With backwards walking patient experienced increased lateral Rt hip pain but was able to complete all sets. Patient tolerated increased weight on leg press. Patient's hip was a little more painful this treatment session but responded well to stretching and manual techniques. Patient will benefit from skilled PT to address the below impairments and improve overall function.     OBJECTIVE IMPAIRMENTS: decreased activity tolerance, difficulty walking, decreased strength, impaired perceived functional ability, impaired flexibility, and pain.   ACTIVITY LIMITATIONS: standing, squatting, hygiene/grooming, and locomotion level  PARTICIPATION LIMITATIONS: meal prep, cleaning, laundry, shopping, and community activity  PERSONAL FACTORS: Time since onset of injury/illness/exacerbation and 1-2 comorbidities: history of OA, history of cellulitis   are also affecting patient's functional outcome.  REHAB POTENTIAL: Good  CLINICAL DECISION MAKING: Stable/uncomplicated  EVALUATION COMPLEXITY: Low   GOALS: Goals reviewed with patient? Yes  SHORT TERM GOALS: Target date: 11/10/2022   The patient will demonstrate knowledge of basic self care strategies and exercises to promote healing  Baseline: Goal status: INITIAL  2.  The patient will have improved right hip strength to at least 4/5 needed for standing, walking longer distances and descending stairs at home and in the community  Baseline:  Goal status: INITIAL  3.   Improved LE strength to rise from a standard chair without UE support 5x in <13 sec without compensations Baseline:  Goal status: INITIAL  4.  The patient will have improved gait stamina and speed needed to ambulate 800 feet in 6 minutes  Baseline:  Goal status: INITIAL   LONG TERM GOALS: Target date: 12/08/2022    The patient will be independent in a safe self progression of a home exercise program to promote further recovery of function  Baseline:  Goal status: INITIAL  2.  The patient will report a 70% improvement in pain levels with functional activities which are currently difficult including standing and walking longer periods of time Baseline:  Goal status: INITIAL  3.  The patient will have improved single leg and narrow base of support balance needed for negotiating uneven pavement safety when in the community and when traveling Baseline:  Goal status: INITIAL  4.  The patient will have improved gait stamina and speed needed to ambulate 1/2 mile for traveling Baseline:  Goal status: INITIAL  5.  LEFS functional outcome score improved to 62/80  Baseline:  Goal status: INITIAL    PLAN:  PT FREQUENCY: 2x/week  PT DURATION: 8 weeks  PLANNED INTERVENTIONS: Therapeutic exercises, Therapeutic activity, Neuromuscular re-education, Balance training, Gait training, Patient/Family education, Self Care, Joint mobilization, Aquatic Therapy, Dry Needling, Electrical stimulation, Spinal mobilization, Cryotherapy, Moist heat, Taping, Ultrasound, Ionotophoresis 4mg /ml Dexamethasone, Manual therapy, and Re-evaluation  PLAN FOR NEXT SESSION: assess hip pain; progress as tolerated  Claude Manges, PT 11/14/22 10:19 AM

## 2022-11-16 ENCOUNTER — Ambulatory Visit: Payer: Medicare Other | Admitting: Physical Therapy

## 2022-11-16 ENCOUNTER — Encounter: Payer: Self-pay | Admitting: Physical Therapy

## 2022-11-16 DIAGNOSIS — M25551 Pain in right hip: Secondary | ICD-10-CM

## 2022-11-16 DIAGNOSIS — R262 Difficulty in walking, not elsewhere classified: Secondary | ICD-10-CM

## 2022-11-16 DIAGNOSIS — M6281 Muscle weakness (generalized): Secondary | ICD-10-CM

## 2022-11-16 NOTE — Therapy (Signed)
OUTPATIENT PHYSICAL THERAPY LOWER EXTREMITY TREATMENT   Patient Name: Yolanda Peters MRN: [id number] DOB:1940/02/15, 82 y.o., female Today's Date: 11/16/2022  END OF SESSION:  PT End of Session - 11/16/22 1317     Visit Number 9    Date for PT Re-Evaluation 12/08/22    Authorization Type [employer] Medicare    Progress Note Due on Visit 10    PT Start Time 1233    PT Stop Time 1315    PT Time Calculation (min) 42 min    Activity Tolerance Patient tolerated treatment well    Behavior During Therapy WFL for tasks assessed/performed                     Past Medical History:  Diagnosis Date   Allergy    Arthritis    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, skin cnacer   Cancer (HCC)    right breast    Hyperlipidemia    no meds taken   Hypertension    Personal history of radiation therapy 2011   rt breast   Past Surgical History:  Procedure Laterality Date   BREAST DUCTAL SYSTEM EXCISION     right breast   BREAST LUMPECTOMY     CATARACT EXTRACTION     bilateral   CESAREAN SECTION     COLONOSCOPY     ELBOW SURGERY     left   I & D EXTREMITY Right 08/06/2020   Procedure: IRRIGATION AND DEBRIDEMENT OF LEG, wound vac placement;  Surgeon: Nadara Mustard, MD;  Location: [facility] OR;  Service: Orthopedics;  Laterality: Right;   I & D EXTREMITY Right 08/11/2020   Procedure: IRRIGATION AND DEBRIDEMENT EXTREMITY SKIN GRAFT;  Surgeon: Nadara Mustard, MD;  Location: [facility] OR;  Service: Orthopedics;  Laterality: Right;   JOINT REPLACEMENT     KNEE ARTHROPLASTY Left    March 2021   MYOMECTOMY     REPLACEMENT TOTAL KNEE Left 03/2019   TOTAL KNEE ARTHROPLASTY Right 06/26/2018   Procedure: TOTAL KNEE ARTHROPLASTY;  Surgeon: Durene Romans, MD;  Location: [facility];  Service: Orthopedics;  Laterality: Right;  70 mins   Patient Active Problem List   Diagnosis Date Noted   Ulcer of right leg, with necrosis of muscle (HCC)    Cellulitis of right leg 08/05/2020   Hypokalemia 06/27/2018    Overweight (BMI 25.0-29.9) 06/27/2018   S/P right TKA 06/26/2018   Autoimmune disease (HCC) 03/15/2016   High risk medication use 03/15/2016   Pain in joint of right shoulder 03/15/2016   Bilateral hip pain 03/15/2016   Primary osteoarthritis of both knees 03/15/2016   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 Her Rt hip pain is 5/10 currently. On the leg press last session she did more repetitions than she should have.  Eval: This has been going on several months.  Some right hip pain and my knees feel heavy.  My daughter wanted me to walk 2 miles a day but I couldn't  do it.  Came from a trip to [place], [place] and [place].  Used a cane on the right side to manage cobble stone.  I should have used it on the left side.  I was the caboose, the slowest one!  [facility] 2x/month goes to stretching, uses bands, weights at [employer] class PERTINENT HISTORY: Right and left TKR 2020 and 2021 2 years ago had an accident at [facility] no railings descending stairs and fell; Dr Lajoyce Corners using cod fish to heal wound, in hospital 1 week PAIN:   Are you having pain? Yes NPRS scale: 0/10 now with prolonged walking4-5/10 Pain location: right posterior/lateral  Aggravating factors: prolonged walking Relieving factors: Alleve  PRECAUTIONS: None    WEIGHT BEARING RESTRICTIONS: No  FALLS:  Has patient fallen in last 6 months? No  LIVING ENVIRONMENT: Lives with: lives  alone Lives in: House/apartment   OCCUPATION: retired  PLOF: Independent  PATIENT GOALS: stronger legs so I can walk faster: next year up the [facility] cruise  OBJECTIVE:  Note: Objective measures were completed at Evaluation unless otherwise noted.  DIAGNOSTIC FINDINGS: x-ray results pending  PATIENT SURVEYS:  LEFS 53/80  COGNITION: Overall cognitive status: Within functional limits for tasks assessed      MUSCLE LENGTH: Decreased right hip flexor length  PALPATION: Mild tender points right upper gluteals  LOWER EXTREMITY ROM:  WFLs  LOWER EXTREMITY MMT:  Able to rise from standard height chair without UE assist   MMT Right eval Left eval  Hip flexion 4- 4+  Hip extension    Hip abduction 3+ 4  Hip adduction    Hip internal rotation    Hip external rotation 4 4+  Knee flexion 4 4+  Knee extension 4 4+  Ankle dorsiflexion    Ankle plantarflexion    Ankle inversion    Ankle eversion     (Blank rows = not tested)  TRUNK STRENGTH:  Decreased strength abdominals 4-/5;  trunk extensors 4-/5   FUNCTIONAL TESTS:  5x sit to stand no hands: (last 3 reps didn't come all the way up to full standing  13.29 SLS: left 3 sec, right 0 sec with lateral trunk lean and pelvic drop :  415 feet DGI 10/19/2022 :16/24 GAIT:  Comments: wider than normal base of support, some lateral deviations side to side; decreased stance time on right No assistive device used   TODAY'S TREATMENT:                                                                                                                              DATE:  11/16/2022  NuStep Level 3 x 6 minutes- PT present to discuss status Supine hamstring stretch with green strap 2 x 30 sec Rt  Supine TFL stretch with green strap 2 x 30 sec Rt  Hip flexion stretch with green strap 2 x 30 sec Rt  Supine hip internal/ external  rotation x 10 each direction  Bridging 2 x 10 Clamshells 2 x 10 each side Seated hip flexion 2#  AW  2 x 10 each Seated LAQ 2# AW  2 x 10 each Standing cone taps (2 in front) 2# AW x 10 each leg Seated piriformis figure four stretch 2 x 30 sec Standing hip flexion stretch on 2nd stair 2 x 30 sec each Manual: addaday to Rt lateral hip musculature to increase circulation. PT present to monitor status.  11/14/2022  NuStep Level 4 x 6 minutes- PT present to discuss status Resisted Walking 5# (backwards; sideways) x 5 each direction- backwards caused increased hip pain Leg Press bilateral 70 lbs 3 x 10 ; unilateral 40 lbs 2 x 10 Y Balance x 8 each unilateral UE support Narrow BOS on stable surface EC 2 x 30sec Tandem on stable surface x 30 sec- finger touch support Attempted STS with 5lb weight but patient had a sharp hip pain and cramping Hamstring  stretch on 2nd stair 2 x 30 sec Rt  Hip flexor stretch on 2nd stair 2 x 30sec Rt Manual: addaday to Rt lateral hip musculature to increase circulation. PT present to monitor status.   11/09/2022  NuStep Level 4 x 6 minutes- PT present to discuss status Resisted Walking 5# (backwards; sideways) x 4 each direction Leg Press bilateral 70 lbs 2 x 10 ; unilateral 35 lbs 2 x 10 Standing hip series (hip flexion, abduction, extension, heel raises) standing on balance pad  x 10 each leg Sit to stands 2 x 10 no UE support 5# kettlebell Hurdles: forward with step-to, sidestepping with step-to gait x 4 each Modified tandem on airex x 30 sec each required finger touch hand hold on barre Narrow BOS on stable surface EC 2 x 30sec Narrow BOS on airex EC 2 x 30 sec - very challenging required frequent use of UE support and opening eyes PT guarding   PATIENT EDUCATION:  Education details: Educated patient on anatomy and physiology of current symptoms, prognosis, plan of care as well as initial self care strategies to promote recovery Person educated: Patient Education method: Explanation Education comprehension: verbalized understanding  HOME EXERCISE  PROGRAM: Access Code: [email or web address] URL: [email or web address] Date: 10/13/2022 Prepared by: Lavinia Sharps  Exercises - Clamshell  - 1-2 x daily - 7 x weekly - 1 sets - 10 reps - Standing Hip Abduction with Counter Support  - 1-2 x daily - 7 x weekly - 1 sets - 10 reps - Standing Hip Extension with Counter Support  - 1-2 x daily - 7 x weekly - 1 sets - 10 reps - Sit to Stand with Arms Crossed  - 1-2 x daily - 7 x weekly - 1 sets - 10 reps - Single Leg Stance with Support  - 1 x daily - 7 x weekly - 1 sets - 3 reps - as long as possible hold  ASSESSMENT:  CLINICAL IMPRESSION:  Today's treatment session focused on hip mobility and strengthening. Patient still was having increased hip pain after last treatment session. She verbalized over doing it on the leg press. Incorporated more hip mobility this treatment session and patient responded favorably. She required verbal and tactile cues for correct exercise performance. Patient will benefit from skilled PT to address the below impairments and improve overall function.     OBJECTIVE IMPAIRMENTS: decreased activity tolerance, difficulty walking, decreased strength, impaired perceived functional ability, impaired flexibility, and pain.   ACTIVITY LIMITATIONS: standing, squatting, hygiene/grooming, and locomotion  level  PARTICIPATION LIMITATIONS: meal prep, cleaning, laundry, shopping, and community activity  PERSONAL FACTORS: Time since onset of injury/illness/exacerbation and 1-2 comorbidities: history of OA, history of cellulitis   are also affecting patient's functional outcome.   REHAB POTENTIAL: Good  CLINICAL DECISION MAKING: Stable/uncomplicated  EVALUATION COMPLEXITY: Low   GOALS: Goals reviewed with patient? Yes  SHORT TERM GOALS: Target date: 11/10/2022   The patient will demonstrate knowledge of basic self care strategies and exercises to promote healing  Baseline: Goal status: INITIAL  2.  The patient will  have improved right hip strength to at least 4/5 needed for standing, walking longer distances and descending stairs at home and in the community  Baseline:  Goal status: INITIAL  3.  Improved LE strength to rise from a standard chair without UE support 5x in <13 sec without compensations Baseline:  Goal status: INITIAL  4.  The patient will have improved gait stamina and speed needed to ambulate 800 feet in 6 minutes  Baseline:  Goal status: INITIAL   LONG TERM GOALS: Target date: 12/08/2022    The patient will be independent in a safe self progression of a home exercise program to promote further recovery of function  Baseline:  Goal status: INITIAL  2.  The patient will report a 70% improvement in pain levels with functional activities which are currently difficult including standing and walking longer periods of time Baseline:  Goal status: INITIAL  3.  The patient will have improved single leg and narrow base of support balance needed for negotiating uneven pavement safety when in the community and when traveling Baseline:  Goal status: INITIAL  4.  The patient will have improved gait stamina and speed needed to ambulate 1/2 mile for traveling Baseline:  Goal status: INITIAL  5.  LEFS functional outcome score improved to 62/80  Baseline:  Goal status: INITIAL    PLAN:  PT FREQUENCY: 2x/week  PT DURATION: 8 weeks  PLANNED INTERVENTIONS: Therapeutic exercises, Therapeutic activity, Neuromuscular re-education, Balance training, Gait training, Patient/Family education, Self Care, Joint mobilization, Aquatic Therapy, Dry Needling, Electrical stimulation, Spinal mobilization, Cryotherapy, Moist heat, Taping, Ultrasound, Ionotophoresis 4mg /ml Dexamethasone, Manual therapy, and Re-evaluation  PLAN FOR NEXT SESSION: PN note next visit; assess hip pain; progress as tolerated  Claude Manges, PT 11/16/22 1:18 PM

## 2022-11-17 ENCOUNTER — Ambulatory Visit: Payer: Medicare Other

## 2022-11-21 ENCOUNTER — Encounter: Payer: Self-pay | Admitting: Physical Therapy

## 2022-11-21 ENCOUNTER — Ambulatory Visit: Payer: Medicare Other | Admitting: Physical Therapy

## 2022-11-21 DIAGNOSIS — M6281 Muscle weakness (generalized): Secondary | ICD-10-CM

## 2022-11-21 DIAGNOSIS — M25551 Pain in right hip: Secondary | ICD-10-CM | POA: Diagnosis not present

## 2022-11-21 DIAGNOSIS — R262 Difficulty in walking, not elsewhere classified: Secondary | ICD-10-CM

## 2022-11-21 NOTE — Therapy (Signed)
OUTPATIENT PHYSICAL THERAPY LOWER EXTREMITY TREATMENT   Patient Name: Yolanda Peters MRN: [id number] DOB:09-15-1940, 82 y.o., female Today's Date: 11/21/2022 Progress Note Reporting Period 10/13/2022 to 11/21/2022  See note below for Objective Data and Assessment of Progress/Goals.     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, skin cnacer   Cancer (HCC)    right breast    Hyperlipidemia    no meds taken   Hypertension    Personal history of radiation therapy 2011   rt breast   Past Surgical History:  Procedure Laterality Date   BREAST DUCTAL SYSTEM EXCISION     right breast   BREAST LUMPECTOMY     CATARACT EXTRACTION     bilateral   CESAREAN SECTION     COLONOSCOPY     ELBOW SURGERY     left   I & D EXTREMITY Right 08/06/2020   Procedure: IRRIGATION AND DEBRIDEMENT OF LEG, wound vac placement;  Surgeon: Nadara Mustard, MD;  Location: [facility] OR;  Service: Orthopedics;  Laterality: Right;   I & D EXTREMITY Right 08/11/2020   Procedure: IRRIGATION AND DEBRIDEMENT EXTREMITY SKIN GRAFT;  Surgeon: Nadara Mustard, MD;  Location: [facility] OR;  Service: Orthopedics;  Laterality: Right;   JOINT REPLACEMENT     KNEE ARTHROPLASTY Left    March 2021   MYOMECTOMY     REPLACEMENT TOTAL KNEE Left 03/2019   TOTAL KNEE ARTHROPLASTY Right 06/26/2018   Procedure: TOTAL KNEE ARTHROPLASTY;  Surgeon: Durene Romans, MD;  Location: [facility];  Service: Orthopedics;  Laterality: Right;  70 mins   Patient Active Problem List    Diagnosis Date Noted   Ulcer of right leg, with necrosis of muscle (HCC)    Cellulitis of right leg 08/05/2020   Hypokalemia 06/27/2018   Overweight (BMI 25.0-29.9) 06/27/2018   S/P right TKA 06/26/2018   Autoimmune disease (HCC) 03/15/2016   High risk medication use 03/15/2016   Pain in joint of right shoulder 03/15/2016   Bilateral hip pain 03/15/2016   Primary osteoarthritis of both knees 03/15/2016   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 She has been experiencing some increased hip pain when standing up from a chair. Overall her pain has improved.   Eval: This has been going on several months.  Some right hip pain and my  knees feel heavy.  My daughter wanted me to walk 2 miles a day but I couldn't do it.  Came from a trip to [place], [place] and [place].  Used a cane on the right side to manage cobble stone.  I should have used it on the left side.  I was the caboose, the slowest one!  [facility] 2x/month goes to stretching, uses bands, weights at [employer] class PERTINENT HISTORY: Right and left TKR 2020 and 2021 2 years ago had an accident at [facility] no railings descending stairs and fell; Dr Lajoyce Corners using cod fish to heal wound, in hospital 1 week PAIN:   Are you having pain? Yes NPRS scale: 0/10 now with prolonged walking4-5/10 Pain location: right posterior/lateral  Aggravating factors: prolonged walking Relieving factors: Alleve  PRECAUTIONS: None    WEIGHT BEARING  RESTRICTIONS: No  FALLS:  Has patient fallen in last 6 months? No  LIVING ENVIRONMENT: Lives with: lives alone Lives in: House/apartment   OCCUPATION: retired  PLOF: Independent  PATIENT GOALS: stronger legs so I can walk faster: next year up the [facility] cruise  OBJECTIVE:  Note: Objective measures were completed at Evaluation unless otherwise noted.  DIAGNOSTIC FINDINGS: x-ray results pending  PATIENT SURVEYS:  LEFS 53/80  COGNITION: Overall cognitive status: Within functional limits for tasks assessed      MUSCLE LENGTH: Decreased right hip flexor length  PALPATION: Mild tender points right upper gluteals  LOWER EXTREMITY ROM:  WFLs  LOWER EXTREMITY MMT:  Able to rise from standard height chair without UE assist   MMT Right eval Left eval  Hip flexion 4- 4+  Hip extension    Hip abduction 3+ 4  Hip adduction    Hip internal rotation    Hip external rotation 4 4+  Knee flexion 4 4+  Knee extension 4 4+  Ankle dorsiflexion    Ankle plantarflexion    Ankle inversion    Ankle eversion     (Blank rows = not tested)  TRUNK STRENGTH:  Decreased strength abdominals 4-/5;  trunk extensors 4-/5   FUNCTIONAL TESTS:  5x sit to stand no hands: (last 3 reps didn't come all the way up to full standing  13.29 SLS: left 3 sec, right 0 sec with lateral trunk lean and pelvic drop :  415 feet DGI 10/19/2022 :16/24 GAIT:  Comments: wider than normal base of support, some lateral deviations side to side; decreased stance time on right No assistive device used   TODAY'S TREATMENT:                                                                                                                              DATE:  11/21/2022  NuStep Level 4 x 6 minutes- PT present to discuss status Supine hamstring stretch with green strap 2 x 30 sec Rt  Supine TFL stretch with green strap 2 x 30 sec Rt  Bridging 2 x 10 Standing hip series 2.5# AW (hip flexion, extension,  abduction) 2 x 10 each Standing Heel raises 2 x 10 Hurdles (forwards/ sideways) x 4 each Step Ups 4 inch Step Tandem x 30sec- required frequent UE support Narrow BOS EC 2 x 30 sec  Manual: addaday to Rt lateral hip musculature to increase circulation. PT present to monitor status.  11/16/2022  NuStep Level 3 x 6 minutes- PT present to discuss status Supine hamstring stretch with green strap 2 x 30 sec Rt  Supine TFL stretch with green strap 2 x 30 sec Rt  Hip flexion stretch with green strap 2 x 30 sec Rt  Supine hip internal/ external rotation x 10 each direction  Bridging 2 x 10 Clamshells 2 x 10 each side Seated hip flexion 2# AW  2 x 10 each Seated LAQ 2# AW  2 x 10 each Standing cone taps (2 in front) 2# AW x 10 each leg Seated piriformis figure four stretch 2 x 30 sec Standing hip flexion stretch on 2nd stair 2 x 30 sec each Manual: addaday to Rt lateral hip musculature to increase circulation. PT present to monitor status.  11/14/2022  NuStep Level 4 x 6 minutes- PT present to discuss status Resisted Walking 5# (backwards; sideways) x 5 each direction- backwards caused increased hip pain Leg Press bilateral 70 lbs 3 x 10 ; unilateral 40 lbs 2 x 10 Y Balance x 8 each unilateral UE support Narrow BOS on stable surface EC 2 x 30sec Tandem on stable surface x 30 sec- finger touch support Attempted STS with 5lb weight but patient had a sharp hip pain and cramping Hamstring  stretch on 2nd stair 2 x 30 sec Rt  Hip flexor stretch on 2nd stair 2 x 30sec Rt Manual: addaday to Rt lateral hip musculature to increase circulation. PT present to monitor status.   PATIENT EDUCATION:  Education details: Educated patient on anatomy and physiology of current symptoms, prognosis, plan of care as well as initial self care strategies to promote recovery Person educated: Patient Education method: Explanation Education comprehension: verbalized understanding  HOME EXERCISE PROGRAM: Access  Code: [email or web address] URL: [email or web address] Date: 10/13/2022 Prepared by: Lavinia Sharps  Exercises - Clamshell  - 1-2 x daily - 7 x weekly - 1 sets - 10 reps - Standing Hip Abduction with Counter Support  - 1-2 x daily - 7 x weekly - 1 sets - 10 reps - Standing Hip Extension with Counter Support  - 1-2 x daily - 7 x weekly - 1 sets - 10 reps - Sit to Stand with Arms Crossed  - 1-2 x daily - 7 x weekly - 1 sets - 10 reps - Single Leg Stance with Support  - 1 x daily - 7 x weekly - 1 sets - 3 reps - as long as possible hold  ASSESSMENT:  CLINICAL IMPRESSION:  Sandy's presented to therapy not as painful as last session. She verbalized only feeling increased pain when she stands up from a chair. Incorporated more standing hip strengthening exercises this treatment session and patient tolerated them well. Patient required verbal and tactile cues for correct exercise performance. Patient responded favorably to manual therapy and verbalized a decrease in pain. Since starting therapy patient has reported a decrease in hip pain. She goes to exercise classes 2-3x/ week to stay active. Patient verbalized not being compliant with HEP like she should be. Eyes closed balance conditions remains to be challenging for patient and she  requires frequent UE support. Patient will benefit from skilled PT to address the below impairments and improve overall function.      OBJECTIVE IMPAIRMENTS: decreased activity tolerance, difficulty walking, decreased strength, impaired perceived functional ability, impaired flexibility, and pain.   ACTIVITY LIMITATIONS: standing, squatting, hygiene/grooming, and locomotion level  PARTICIPATION LIMITATIONS: meal prep, cleaning, laundry, shopping, and community activity  PERSONAL FACTORS: Time since onset of injury/illness/exacerbation and 1-2 comorbidities: history of OA, history of cellulitis   are also affecting patient's functional outcome.   REHAB POTENTIAL:  Good  CLINICAL DECISION MAKING: Stable/uncomplicated  EVALUATION COMPLEXITY: Low   GOALS: Goals reviewed with patient? Yes  SHORT TERM GOALS: Target date: 11/10/2022   The patient will demonstrate knowledge of basic self care strategies and exercises to promote healing  Baseline: Goal status: MET 11/21/2022  2.  The patient will have improved right hip strength to at least 4/5 needed for standing, walking longer distances and descending stairs at home and in the community  Baseline:  Goal status: IN PROGRESS 11/21/2022  3.  Improved LE strength to rise from a standard chair without UE support 5x in <13 sec without compensations Baseline:  Goal status: IN PROGRESS 11/21/2022  4.  The patient will have improved gait stamina and speed needed to ambulate 800 feet in 6 minutes  Baseline:  Goal status: IN PROGRESS 11/21/2022   LONG TERM GOALS: Target date: 12/08/2022    The patient will be independent in a safe self progression of a home exercise program to promote further recovery of function  Baseline:  Goal status: IN PROGRESS 11/21/2022  2.  The patient will report a 70% improvement in pain levels with functional activities which are currently difficult including standing and walking longer periods of time Baseline:  Goal status: IN PROGRESS 11/21/2022  3.  The patient will have improved single leg and narrow base of support balance needed for negotiating uneven pavement safety when in the community and when traveling Baseline:  Goal status: IN PROGRESS 11/21/2022  4.  The patient will have improved gait stamina and speed needed to ambulate 1/2 mile for traveling Baseline:  Goal status: IN PROGRESS 11/21/2022  5.  LEFS functional outcome score improved to 62/80  Baseline:  Goal status: IN PROGRESS 11/21/2022    PLAN:  PT FREQUENCY: 2x/week  PT DURATION: 8 weeks  PLANNED INTERVENTIONS: Therapeutic exercises, Therapeutic activity, Neuromuscular re-education,  Balance training, Gait training, Patient/Family education, Self Care, Joint mobilization, Aquatic Therapy, Dry Needling, Electrical stimulation, Spinal mobilization, Cryotherapy, Moist heat, Taping, Ultrasound, Ionotophoresis 4mg /ml Dexamethasone, Manual therapy, and Re-evaluation  PLAN FOR NEXT SESSION: 6 MWT; 5 STS  Claude Manges, PT 11/21/22 1:19 PM

## 2022-11-23 ENCOUNTER — Ambulatory Visit: Payer: Medicare Other | Admitting: Physical Therapy

## 2022-11-23 ENCOUNTER — Encounter: Payer: Self-pay | Admitting: Physical Therapy

## 2022-11-23 DIAGNOSIS — M6281 Muscle weakness (generalized): Secondary | ICD-10-CM

## 2022-11-23 DIAGNOSIS — R262 Difficulty in walking, not elsewhere classified: Secondary | ICD-10-CM

## 2022-11-23 DIAGNOSIS — M25551 Pain in right hip: Secondary | ICD-10-CM | POA: Diagnosis not present

## 2022-11-23 NOTE — Therapy (Signed)
OUTPATIENT PHYSICAL THERAPY LOWER EXTREMITY TREATMENT   Patient Name: Yolanda Peters MRN: [id number] DOB:1940-04-02, 82 y.o., female Today's Date: 11/23/2022     END OF SESSION:  PT End of Session - 11/23/22 1143     Visit Number 11    Date for PT Re-Evaluation 12/08/22    Authorization Type [employer] Medicare    Progress Note Due on Visit 20    PT Start Time 1100    PT Stop Time 1143    PT Time Calculation (min) 43 min    Activity Tolerance Patient tolerated treatment well    Behavior During Therapy WFL for tasks assessed/performed                       Past Medical History:  Diagnosis Date   Allergy    Arthritis    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, skin cnacer   Cancer (HCC)    right breast    Hyperlipidemia    no meds taken   Hypertension    Personal history of radiation therapy 2011   rt breast   Past Surgical History:  Procedure Laterality Date   BREAST DUCTAL SYSTEM EXCISION     right breast   BREAST LUMPECTOMY     CATARACT EXTRACTION     bilateral   CESAREAN SECTION     COLONOSCOPY     ELBOW SURGERY     left   I & D EXTREMITY Right 08/06/2020   Procedure: IRRIGATION AND DEBRIDEMENT OF LEG, wound vac placement;  Surgeon: Nadara Mustard, MD;  Location: [facility] OR;  Service: Orthopedics;  Laterality: Right;   I & D EXTREMITY Right 08/11/2020   Procedure: IRRIGATION AND DEBRIDEMENT EXTREMITY SKIN GRAFT;  Surgeon: Nadara Mustard, MD;  Location: [facility] OR;  Service: Orthopedics;  Laterality: Right;   JOINT REPLACEMENT     KNEE ARTHROPLASTY Left    March 2021   MYOMECTOMY     REPLACEMENT TOTAL KNEE Left 03/2019   TOTAL KNEE ARTHROPLASTY Right 06/26/2018   Procedure: TOTAL KNEE ARTHROPLASTY;  Surgeon: Durene Romans, MD;  Location: [facility] ORS;  Service: Orthopedics;  Laterality: Right;  70 mins   Patient Active Problem List   Diagnosis Date Noted   Ulcer of right leg, with necrosis of muscle (HCC)    Cellulitis of right leg 08/05/2020   Hypokalemia  06/27/2018   Overweight (BMI 25.0-29.9) 06/27/2018   S/P right TKA 06/26/2018   Autoimmune disease (HCC) 03/15/2016   High risk medication use 03/15/2016   Pain in joint of right shoulder 03/15/2016   Bilateral hip pain 03/15/2016   Primary osteoarthritis of both knees 03/15/2016   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 Her Rt hip does not feel the same as her Lt hip. It feels like a muscle pain. Her pain is currently 2-3/10.  Eval: This has been going on several months.  Some right hip pain and my knees feel heavy.  My daughter wanted me to walk 2 miles a day  but I couldn't do it.  Came from a trip to [place], [place] and [place].  Used a cane on the right side to manage cobble stone.  I should have used it on the left side.  I was the caboose, the slowest one!  [facility] 2x/month goes to stretching, uses bands, weights at [employer] class PERTINENT HISTORY: Right and left TKR 2020 and 2021 2 years ago had an accident at [facility] no railings descending stairs and fell; Dr Lajoyce Corners using cod fish to heal wound, in hospital 1 week PAIN:   Are you having pain? Yes NPRS scale: 0/10 now with prolonged walking4-5/10 Pain location: right posterior/lateral  Aggravating factors: prolonged walking Relieving factors: Alleve  PRECAUTIONS: None    WEIGHT BEARING RESTRICTIONS: No  FALLS:  Has patient fallen in last 6 months? No  LIVING ENVIRONMENT: Lives with: lives alone Lives in:  House/apartment   OCCUPATION: retired  PLOF: Independent  PATIENT GOALS: stronger legs so I can walk faster: next year up the [facility] cruise  OBJECTIVE:  Note: Objective measures were completed at Evaluation unless otherwise noted.  DIAGNOSTIC FINDINGS: x-ray results pending  PATIENT SURVEYS:  LEFS 53/80  COGNITION: Overall cognitive status: Within functional limits for tasks assessed      MUSCLE LENGTH: Decreased right hip flexor length  PALPATION: Mild tender points right upper gluteals  LOWER EXTREMITY ROM:  WFLs  LOWER EXTREMITY MMT:  Able to rise from standard height chair without UE assist   MMT Right eval Left eval  Hip flexion 4- 4+  Hip extension    Hip abduction 3+ 4  Hip adduction    Hip internal rotation    Hip external rotation 4 4+  Knee flexion 4 4+  Knee extension 4 4+  Ankle dorsiflexion    Ankle plantarflexion    Ankle inversion    Ankle eversion     (Blank rows = not tested)  TRUNK STRENGTH:  Decreased strength abdominals 4-/5;  trunk extensors 4-/5   FUNCTIONAL TESTS:  5x sit to stand no hands: (last 3 reps didn't come all the way up to full standing  13.29 SLS: left 3 sec, right 0 sec with lateral trunk lean and pelvic drop :  415 feet DGI 10/19/2022 :16/24 11/23/2022: 6 MWT: 849ft RPE 3 ; mark: 441ft  GAIT:  Comments: wider than normal base of support, some lateral deviations side to side; decreased stance time on right No assistive device used   TODAY'S TREATMENT:                                                                                                                              DATE:  11/23/2022  NuStep Level 4 x 4 minutes- PT present to discuss status 6 MWT: 833 ft RPE 3 Leg Press bilateral 70 lbs Resisted walking Standing hip series (hip flexion, hip abduction, hip extension) 2.5# AW 2 x  10 each Standing heel raises 2.5# AW 2 x 10 each Seated LAQ 2.5# AW 2 x 10 each Sit to stands 5lb KB  x  10 Step Taps 6 inch min UE support x 10 each  11/21/2022  NuStep Level 4 x 6 minutes- PT present to discuss status Supine hamstring stretch with green strap 2 x 30 sec Rt  Supine TFL stretch with green strap 2 x 30 sec Rt  Bridging 2 x 10 Standing hip series 2.5# AW (hip flexion, extension, abduction) 2 x 10 each Standing Heel raises 2 x 10 Hurdles (forwards/ sideways) x 4 each Step Ups 4 inch Step Tandem x 30sec- required frequent UE support Narrow BOS EC 2 x 30 sec  Manual: addaday to Rt lateral hip musculature to increase circulation. PT present to monitor status.  11/16/2022  NuStep Level 3 x 6 minutes- PT present to discuss status Supine hamstring stretch with green strap 2 x 30 sec Rt  Supine TFL stretch with green strap 2 x 30 sec Rt  Hip flexion stretch with green strap 2 x 30 sec Rt  Supine hip internal/ external rotation x 10 each direction  Bridging 2 x 10 Clamshells 2 x 10 each side Seated hip flexion 2# AW  2 x 10 each Seated LAQ 2# AW  2 x 10 each Standing cone taps (2 in front) 2# AW x 10 each leg Seated piriformis figure four stretch 2 x 30 sec Standing hip flexion stretch on 2nd stair 2 x 30 sec each Manual: addaday to Rt lateral hip musculature to increase circulation. PT present to monitor status.   PATIENT EDUCATION:  Education details: Educated patient on anatomy and physiology of current symptoms, prognosis, plan of care as well as initial self care strategies to promote recovery Person educated: Patient Education method: Explanation Education comprehension: verbalized understanding  HOME EXERCISE PROGRAM: Access Code: [email or web address] URL: [email or web address] Date: 10/13/2022 Prepared by: Lavinia Sharps  Exercises - Clamshell  - 1-2 x daily - 7 x weekly - 1 sets - 10 reps - Standing Hip Abduction with Counter Support  - 1-2 x daily - 7 x weekly - 1 sets - 10 reps - Standing Hip Extension with Counter Support  - 1-2 x daily - 7 x weekly - 1  sets - 10 reps - Sit to Stand with Arms Crossed  - 1-2 x daily - 7 x weekly - 1 sets - 10 reps - Single Leg Stance with Support  - 1 x daily - 7 x weekly - 1 sets - 3 reps - as long as possible hold  ASSESSMENT:  CLINICAL IMPRESSION:  Today's treatment session focused on LE strengthening. Administered 6 MWT today and Sandy ambulated 839ft. Based on her age she is at 68% of her age predicted distance. Noted decreased Rt hip flexion and decreased clearance of Rt foot at times. Educated patient on the importance of strengthening her legs and staying compliant with her HEP to improve walking tolerance. Patient tolerated treatment session well and did not verbalized any increased Rt hip pain. Patient will benefit from skilled PT to address the below impairments and improve overall function.      OBJECTIVE IMPAIRMENTS: decreased activity tolerance, difficulty walking, decreased strength, impaired perceived functional ability, impaired flexibility, and pain.   ACTIVITY LIMITATIONS: standing, squatting, hygiene/grooming, and locomotion level  PARTICIPATION LIMITATIONS: meal prep, cleaning, laundry, shopping, and community activity  PERSONAL FACTORS: Time since onset of injury/illness/exacerbation and 1-2 comorbidities: history of OA, history of cellulitis  are also affecting patient's functional outcome.   REHAB POTENTIAL: Good  CLINICAL DECISION MAKING: Stable/uncomplicated  EVALUATION COMPLEXITY: Low   GOALS: Goals reviewed with patient? Yes  SHORT TERM GOALS: Target date: 11/10/2022   The patient will demonstrate knowledge of basic self care strategies and exercises to promote healing  Baseline: Goal status: MET 11/21/2022  2.  The patient will have improved right hip strength to at least 4/5 needed for standing, walking longer distances and descending stairs at home and in the community  Baseline:  Goal status: IN PROGRESS 11/21/2022  3.  Improved LE strength to rise from a  standard chair without UE support 5x in <13 sec without compensations Baseline:  Goal status: IN PROGRESS 11/21/2022  4.  The patient will have improved gait stamina and speed needed to ambulate 800 feet in 6 minutes  Baseline:  Goal status: IN PROGRESS 11/21/2022   LONG TERM GOALS: Target date: 12/08/2022    The patient will be independent in a safe self progression of a home exercise program to promote further recovery of function  Baseline:  Goal status: IN PROGRESS 11/21/2022  2.  The patient will report a 70% improvement in pain levels with functional activities which are currently difficult including standing and walking longer periods of time Baseline:  Goal status: IN PROGRESS 11/21/2022  3.  The patient will have improved single leg and narrow base of support balance needed for negotiating uneven pavement safety when in the community and when traveling Baseline:  Goal status: IN PROGRESS 11/21/2022  4.  The patient will have improved gait stamina and speed needed to ambulate 1/2 mile for traveling Baseline:  Goal status: IN PROGRESS 11/21/2022  5.  LEFS functional outcome score improved to 62/80  Baseline:  Goal status: IN PROGRESS 11/21/2022    PLAN:  PT FREQUENCY: 2x/week  PT DURATION: 8 weeks  PLANNED INTERVENTIONS: Therapeutic exercises, Therapeutic activity, Neuromuscular re-education, Balance training, Gait training, Patient/Family education, Self Care, Joint mobilization, Aquatic Therapy, Dry Needling, Electrical stimulation, Spinal mobilization, Cryotherapy, Moist heat, Taping, Ultrasound, Ionotophoresis 4mg /ml Dexamethasone, Manual therapy, and Re-evaluation  PLAN FOR NEXT SESSION: continue hip strengthening; airex balance  Claude Manges, PT 11/23/22 11:44 AM

## 2022-11-24 ENCOUNTER — Ambulatory Visit
Admission: RE | Admit: 2022-11-24 | Discharge: 2022-11-24 | Disposition: A | Discharge status: Home / Self Care | Payer: Medicare Other | Source: Ambulatory Visit | Attending: Internal Medicine | Admitting: Internal Medicine

## 2022-11-24 DIAGNOSIS — Z0000 Encounter for general adult medical examination without abnormal findings: Secondary | ICD-10-CM

## 2022-11-29 ENCOUNTER — Ambulatory Visit: Payer: Medicare Other | Admitting: Physical Therapy

## 2022-11-29 DIAGNOSIS — M25551 Pain in right hip: Secondary | ICD-10-CM | POA: Diagnosis not present

## 2022-11-29 DIAGNOSIS — M6281 Muscle weakness (generalized): Secondary | ICD-10-CM

## 2022-11-29 DIAGNOSIS — R262 Difficulty in walking, not elsewhere classified: Secondary | ICD-10-CM

## 2022-11-29 NOTE — Therapy (Signed)
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, skin cnacer   Cancer (HCC)    right breast    Hyperlipidemia    no meds taken   Hypertension    Personal history of radiation therapy 2011   rt breast   Past Surgical History:  Procedure Laterality Date   BREAST DUCTAL SYSTEM EXCISION     right breast   BREAST LUMPECTOMY     CATARACT EXTRACTION     bilateral   CESAREAN SECTION     COLONOSCOPY     ELBOW SURGERY     left   I & D EXTREMITY Right 08/06/2020   Procedure: IRRIGATION AND DEBRIDEMENT OF LEG, wound vac placement;  Surgeon: Nadara Mustard, MD;  Location: [facility] OR;  Service: Orthopedics;  Laterality: Right;   I & D EXTREMITY Right 08/11/2020   Procedure: IRRIGATION AND DEBRIDEMENT EXTREMITY SKIN GRAFT;  Surgeon: Nadara Mustard, MD;  Location: [facility] OR;  Service: Orthopedics;  Laterality: Right;   JOINT REPLACEMENT     KNEE ARTHROPLASTY Left    March 2021   MYOMECTOMY     REPLACEMENT TOTAL KNEE Left 03/2019   TOTAL KNEE ARTHROPLASTY Right 06/26/2018   Procedure: TOTAL KNEE ARTHROPLASTY;  Surgeon: Durene Romans, MD;  Location: [facility] OR[facility];  Service: Orthopedics;  Laterality: Right;  70 mins   Patient Active Problem List   Diagnosis Date Noted   Ulcer of right leg, with necrosis of muscle (HCC)    Cellulitis of right leg 08/05/2020   Hypokalemia 06/27/2018   Overweight (BMI 25.0-29.9) 06/27/2018   S/P right  TKA 06/26/2018   Autoimmune disease (HCC) 03/15/2016   High risk medication use 03/15/2016   Pain in joint of right shoulder 03/15/2016   Bilateral hip pain 03/15/2016   Primary osteoarthritis of both knees 03/15/2016   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  I overdid it on the leg press after doing a lot of reps of sit to stands. It's not hurting now only for a short time the week before last.   I might need to work on my own at home soon.  Haven't been to [employer] in a couple of weeks b/c of scheduling (class for stretching).   Eval: This has  been going on several months.  Some right hip pain and my knees feel heavy.  My daughter wanted me to walk 2 miles a day but I couldn't do it.  Came from a trip to [place], [place] and [place].  Used a cane on the right side to manage cobble stone.  I should have used it on the left side.  I was the caboose, the slowest one!  [facility]; 2x/month goes to stretching, uses bands, weights at [employer] class PERTINENT HISTORY: Right and left TKR 2020 and 2021 2 years ago had an accident at [facility] no railings descending stairs and fell; Dr Lajoyce Corners using cod fish to heal wound, in hospital 1 week PAIN:   Are you having pain? no NPRS scale: 0/10 now with prolonged walking4-5/10 Pain location: right posterior/lateral  Aggravating factors: prolonged walking Relieving factors: Alleve  PRECAUTIONS: None    WEIGHT BEARING RESTRICTIONS: No  FALLS:  Has patient fallen in  last 6 months? No  LIVING ENVIRONMENT: Lives with: lives alone Lives in: House/apartment   OCCUPATION: retired  PLOF: Independent  PATIENT GOALS: stronger legs so I can walk faster: next year up the [facility] cruise  OBJECTIVE:  Note: Objective measures were completed at Evaluation unless otherwise noted.  DIAGNOSTIC FINDINGS: x-ray results pending  PATIENT SURVEYS:  LEFS 53/80  COGNITION: Overall cognitive status: Within functional limits for tasks assessed      MUSCLE LENGTH: Decreased right hip flexor length  PALPATION: Mild tender points right upper gluteals  LOWER EXTREMITY ROM:  WFLs  LOWER EXTREMITY MMT:  Able to rise from standard height chair without UE assist   MMT Right eval Left eval  Hip flexion 4- 4+  Hip extension    Hip abduction 3+ 4  Hip adduction    Hip internal rotation    Hip external rotation 4 4+  Knee flexion 4 4+  Knee extension 4 4+  Ankle dorsiflexion    Ankle plantarflexion    Ankle inversion    Ankle eversion     (Blank rows = not tested)  TRUNK STRENGTH:  Decreased strength abdominals 4-/5;  trunk extensors 4-/5   FUNCTIONAL TESTS:  5x sit to stand no hands: (last 3 reps didn't come all the way up to full standing  13.29 SLS: left 3 sec, right 0 sec with lateral trunk lean and pelvic drop :  415 feet DGI 10/19/2022 :16/24 11/23/2022: 6 MWT: 872ft RPE 3 ; mark: 489ft  GAIT:  Comments: wider than normal base of support, some lateral deviations side to side; decreased stance time on right No assistive device used   TODAY'S TREATMENT:                                                                                                                              DATE:  11/29/2022  NuStep Level 3 x 7 minutes- PT present to discuss status  2nd step hip flexor/calf stretching 10x right/left 2nd step HS stretch 10x right/left  Leg swings with 2.5 ankle weight 15x right/left (off bottom step at the stairs)  Standing  hip series (hip flexion, hip abduction, hip extension) 2.5#  x 10 each Standing heel raises 2.5#  20x  Seated LAQ 2.5#  2 x 10 each Holding 5# dumbbell by side with 6 inch step tap with opposite leg 10x each side Leg Press bilateral 80 lbs 10x 2 Resisted backward walking 15# with handles 8x (challenging) Squats with pink medium power cord on right hip for lateral distraction 10x  11/23/2022  NuStep Level 4 x 4 minutes- PT present to discuss status 6 MWT: 833 ft RPE 3 Leg Press bilateral 70 lbs Resisted walking Standing hip series (hip flexion, hip abduction, hip extension) 2.5# AW 2 x 10 each Standing heel raises 2.5# AW 2 x 10 each Seated LAQ 2.5# AW 2 x 10 each Sit to stands 5lb KB  x 10 Step Taps 6 inch min UE support x 10 each  11/21/2022  NuStep Level 4 x 6 minutes- PT present to discuss status Supine hamstring stretch with green strap 2 x 30 sec Rt  Supine TFL stretch with green strap 2 x 30 sec Rt  Bridging 2 x 10 Standing hip series 2.5# AW (hip flexion, extension, abduction) 2 x 10 each Standing Heel raises 2 x 10 Hurdles (forwards/ sideways) x 4 each Step Ups 4 inch Step Tandem x 30sec- required frequent UE support Narrow BOS EC 2 x 30 sec  Manual: addaday to Rt lateral hip musculature to increase circulation. PT present to monitor status.  11/16/2022  NuStep Level 3 x 6 minutes- PT present to discuss status Supine hamstring stretch with green strap 2 x 30 sec Rt  Supine TFL stretch with green strap 2 x 30 sec Rt  Hip flexion stretch with green strap 2 x 30 sec Rt  Supine hip internal/ external rotation x 10 each direction  Bridging 2 x 10 Clamshells 2 x 10 each side Seated hip flexion 2# AW  2 x 10 each Seated LAQ 2# AW  2 x 10 each Standing cone taps (2 in front) 2# AW x 10 each leg Seated piriformis figure four stretch 2 x 30 sec Standing hip flexion stretch on 2nd stair 2 x 30 sec each Manual: addaday to Rt lateral hip musculature to increase circulation. PT  present to monitor status.   PATIENT EDUCATION:  Education details: Educated patient on anatomy and physiology of current symptoms, prognosis, plan of care as well as initial self care strategies to promote recovery Person educated: Patient Education method: Explanation Education comprehension: verbalized understanding  HOME EXERCISE PROGRAM: Access Code: [email or web address] URL: [email or web address] Date: 10/13/2022 Prepared by: Lavinia Sharps  Exercises - Clamshell  - 1-2 x daily - 7 x weekly - 1 sets - 10 reps - Standing Hip Abduction with Counter Support  - 1-2 x daily - 7 x weekly - 1 sets - 10 reps - Standing Hip Extension with Counter Support  - 1-2 x daily - 7 x weekly - 1 sets - 10 reps - Sit to Stand with Arms Crossed  - 1-2 x daily - 7 x weekly - 1 sets - 10 reps - Single Leg Stance with Support  - 1 x daily - 7 x weekly - 1 sets - 3 reps - as long as possible hold  ASSESSMENT:  CLINICAL IMPRESSION:  Able to increase weight on leg press  and trial of additional hip mobility and strengthening ex's without hip pain production.  Therapist providing verbal cues to optimize technique with  exercises in order to achieve the greatest benefit.  Patient states she is feeling more confident in her abilities and recognizes she needs to keep exercising on her own at home and following discharge from PT.  Assess progress toward goals next visit to determine readiness for discharge.      OBJECTIVE IMPAIRMENTS: decreased activity tolerance, difficulty walking, decreased strength, impaired perceived functional ability, impaired flexibility, and pain.   ACTIVITY LIMITATIONS: standing, squatting, hygiene/grooming, and locomotion level  PARTICIPATION LIMITATIONS: meal prep, cleaning, laundry, shopping, and community activity  PERSONAL FACTORS: Time since onset of injury/illness/exacerbation and 1-2 comorbidities: history of OA, history of cellulitis   are also affecting patient's  functional outcome.   REHAB POTENTIAL: Good  CLINICAL DECISION MAKING: Stable/uncomplicated  EVALUATION COMPLEXITY: Low   GOALS: Goals reviewed with patient? Yes  SHORT TERM GOALS: Target date: 11/10/2022   The patient will demonstrate knowledge of basic self care strategies and exercises to promote healing  Baseline: Goal status: MET 11/21/2022  2.  The patient will have improved right hip strength to at least 4/5 needed for standing, walking longer distances and descending stairs at home and in the community  Baseline:  Goal status: IN PROGRESS 11/21/2022  3.  Improved LE strength to rise from a standard chair without UE support 5x in <13 sec without compensations Baseline:  Goal status: IN PROGRESS 11/21/2022  4.  The patient will have improved gait stamina and speed needed to ambulate 800 feet in 6 minutes  Baseline:  Goal status:met 11/13  LONG TERM GOALS: Target date: 12/08/2022    The patient will be independent in a safe self progression of a home exercise program to promote further recovery of function  Baseline:  Goal status: IN PROGRESS 11/21/2022  2.  The patient will report a 70% improvement in pain levels with functional activities which are currently difficult including standing and walking longer periods of time Baseline:  Goal status: IN PROGRESS 11/21/2022  3.  The patient will have improved single leg and narrow base of support balance needed for negotiating uneven pavement safety when in the community and when traveling Baseline:  Goal status: IN PROGRESS 11/21/2022  4.  The patient will have improved gait stamina and speed needed to ambulate 1/2 mile for traveling Baseline:  Goal status: IN PROGRESS 11/21/2022  5.  LEFS functional outcome score improved to 62/80  Baseline:  Goal status: IN PROGRESS 11/21/2022    PLAN:  PT FREQUENCY: 2x/week  PT DURATION: 8 weeks  PLANNED INTERVENTIONS: Therapeutic exercises, Therapeutic activity,  Neuromuscular re-education, Balance training, Gait training, Patient/Family education, Self Care, Joint mobilization, Aquatic Therapy, Dry Needling, Electrical stimulation, Spinal mobilization, Cryotherapy, Moist heat, Taping, Ultrasound, Ionotophoresis 4mg /ml Dexamethasone, Manual therapy, and Re-evaluation  PLAN FOR NEXT SESSION: determine ERO vs discharge; LEFS; check LTGs; 5x STS; continue hip strengthening; airex balance  Lavinia Sharps, PT 11/29/22 3:59 PM Phone: [phone] Fax: [phone]

## 2022-12-05 ENCOUNTER — Encounter: Payer: Medicare Other | Admitting: Rehabilitative and Restorative Service Providers"

## 2022-12-06 ENCOUNTER — Ambulatory Visit: Payer: Medicare Other | Admitting: Physical Therapy

## 2022-12-06 DIAGNOSIS — M25551 Pain in right hip: Secondary | ICD-10-CM

## 2022-12-06 DIAGNOSIS — M6281 Muscle weakness (generalized): Secondary | ICD-10-CM

## 2022-12-06 DIAGNOSIS — R262 Difficulty in walking, not elsewhere classified: Secondary | ICD-10-CM

## 2022-12-06 NOTE — Therapy (Signed)
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, skin cnacer   Cancer (HCC)    right breast    Hyperlipidemia    no meds taken   Hypertension    Personal history of radiation therapy 2011   rt breast   Past Surgical History:  Procedure Laterality Date   BREAST DUCTAL SYSTEM EXCISION     right breast   BREAST LUMPECTOMY     CATARACT EXTRACTION     bilateral   CESAREAN SECTION     COLONOSCOPY     ELBOW SURGERY     left   I & D EXTREMITY Right 08/06/2020   Procedure: IRRIGATION AND DEBRIDEMENT OF LEG, wound vac placement;  Surgeon: Nadara Mustard, MD;  Location: [facility] OR;  Service: Orthopedics;  Laterality: Right;   I & D EXTREMITY Right 08/11/2020   Procedure: IRRIGATION AND DEBRIDEMENT EXTREMITY SKIN GRAFT;  Surgeon: Nadara Mustard, MD;  Location: [facility] OR;  Service: Orthopedics;  Laterality: Right;   JOINT REPLACEMENT     KNEE ARTHROPLASTY Left    March 2021   MYOMECTOMY     REPLACEMENT TOTAL KNEE Left 03/2019   TOTAL KNEE ARTHROPLASTY Right 06/26/2018   Procedure: TOTAL KNEE ARTHROPLASTY;  Surgeon: Durene Romans, MD;  Location: [facility] ORS;  Service: Orthopedics;  Laterality: Right;  70 mins   Patient Active Problem List   Diagnosis Date Noted   Ulcer of right leg, with necrosis of muscle (HCC)    Cellulitis of right leg 08/05/2020   Hypokalemia 06/27/2018   Overweight (BMI 25.0-29.9)  06/27/2018   S/P right TKA 06/26/2018   Autoimmune disease (HCC) 03/15/2016   High risk medication use 03/15/2016   Pain in joint of right shoulder 03/15/2016   Bilateral hip pain 03/15/2016   Primary osteoarthritis of both knees 03/15/2016   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  In general my hip has been feeling better.  Sometimes my knees feel heavy.  Patient expresses readiness for discharge from PT to independent HEP.  Eval: This has been going on several months.  Some right hip pain and my knees feel heavy.  My daughter wanted me to walk 2 miles a day but I couldn't do it.  Came from  a trip to [place], [place] and [place].  Used a cane on the right side to manage [place].  I should have used it on the left side.  I was the caboose, the slowest one!  [facility]; 2x/month goes to stretching, uses bands, weights at [employer] class PERTINENT HISTORY: Right and left TKR 2020 and 2021 2 years ago had an accident at [facility] no railings descending stairs and fell; Dr Lajoyce Corners using cod fish to heal wound, in hospital 1 week PAIN:   Are you having pain? no NPRS scale: 0/10 now took Alleve this morning Pain location: right posterior/lateral  Aggravating factors: prolonged walking Relieving factors: Alleve  PRECAUTIONS: None    WEIGHT BEARING RESTRICTIONS: No  FALLS:  Has patient fallen in last 6 months? No  LIVING ENVIRONMENT: Lives with: lives alone Lives in:  House/apartment   OCCUPATION: retired  PLOF: Independent  PATIENT GOALS: stronger legs so I can walk faster: next year up the [facility] cruise  OBJECTIVE:  Note: Objective measures were completed at Evaluation unless otherwise noted.  DIAGNOSTIC FINDINGS: x-ray results pending  PATIENT SURVEYS:  LEFS 53/80 11/26: 72/80  COGNITION: Overall cognitive status: Within functional limits for tasks assessed      MUSCLE LENGTH: Decreased right hip flexor length  PALPATION: Mild tender points right upper gluteals  LOWER EXTREMITY ROM:  WFLs  LOWER EXTREMITY MMT:  Able to rise from standard height chair without UE assist   MMT Right eval Left eval 11/26  Hip flexion 4- 4+ 5  Hip extension     Hip abduction 3+ 4 4  Hip adduction     Hip internal rotation     Hip external rotation 4 4+ 5  Knee flexion 4 4+ 5  Knee extension 4 4+ 5  Ankle dorsiflexion     Ankle plantarflexion     Ankle inversion     Ankle eversion      (Blank rows = not tested)  TRUNK STRENGTH:  Decreased strength abdominals 4-/5;  trunk extensors 4-/5   FUNCTIONAL TESTS:  5x sit to stand no hands: (last 3 reps didn't come all the way up to full standing  13.29 SLS: left 3 sec, right 0 sec with lateral trunk lean and pelvic drop :  415 feet DGI 10/19/2022 :16/24 11/23/2022: 6 MWT: 84ft RPE 3 ; mark: 459ft 11/26: 5x STS 15.29 sec all the way up no hands needed 6 MWT;  896 feet wider base of support, decreased speed; discomfort in hips  Limited SLS bilaterally  GAIT:  Comments: wider than normal base of support, some lateral deviations side to side; decreased stance time on right No assistive device used   TODAY'S TREATMENT:                                                                                                                              DATE:  12/06/2022  NuStep Level 3 x 7 minutes- PT present to discuss status LEFS 5x STS SLS 6 MWT Review of progress toward  goals Review of HEP  Added supine band clams and seated band clams to HEP (pt has multiple bands at home) Discussed physical activity guidelines  11/29/2022  NuStep Level 3 x 7 minutes- PT present to discuss status 2nd step hip flexor/calf stretching 10x right/left 2nd step HS stretch 10x right/left  Leg swings with 2.5 ankle weight 15x right/left (off bottom step at the stairs)  Standing hip series (hip flexion, hip abduction, hip extension) 2.5#  x 10 each Standing heel raises 2.5#  20x  Seated LAQ 2.5#  2 x 10 each Holding 5# dumbbell by side with 6 inch step tap with opposite leg 10x each side Leg Press bilateral 80 lbs 10x 2 Resisted backward walking 15# with handles 8x (challenging) Squats with pink medium power cord on right hip for lateral distraction 10x  11/23/2022  NuStep Level 4 x 4 minutes- PT present to discuss status 6 MWT: 833 ft RPE 3 Leg Press bilateral 70 lbs Resisted walking Standing hip series (hip flexion, hip abduction, hip extension) 2.5# AW 2 x 10 each Standing heel raises 2.5# AW 2 x 10 each Seated LAQ 2.5# AW 2 x 10 each Sit to stands 5lb KB  x 10 Step Taps 6 inch min UE support x 10 each  11/21/2022  NuStep Level 4 x 6 minutes- PT present to discuss status Supine hamstring stretch with green strap 2 x 30 sec Rt  Supine TFL stretch with green strap 2 x 30 sec Rt  Bridging 2 x 10 Standing hip series 2.5# AW (hip flexion, extension, abduction) 2 x 10 each Standing Heel raises 2 x 10 Hurdles (forwards/ sideways) x 4 each Step Ups 4 inch Step Tandem x 30sec- required frequent UE support Narrow BOS EC 2 x 30 sec  Manual: addaday to Rt lateral hip musculature to increase circulation. PT present to monitor status.   PATIENT EDUCATION:  Education details: Educated patient on anatomy and physiology of current symptoms, prognosis, plan of care as well as initial self care strategies to promote recovery Person educated: Patient Education method:  Explanation Education comprehension: verbalized understanding  HOME EXERCISE PROGRAM: Access Code: [email or web address] URL: [email or web address] Date: 12/06/2022 Prepared by: Lavinia Sharps  Exercises - Clamshell  - 1-2 x daily - 7 x weekly - 1 sets - 10 reps - Hooklying Clamshell with Resistance  - 1 x daily - 7 x weekly - 3 sets - 10 reps - Seated Hip Abduction with Resistance  - 1 x daily - 7 x weekly - 2 sets - 10 reps - Standing Hip Abduction with Counter Support  - 1-2 x daily - 7 x weekly - 1 sets - 10 reps - Standing Hip Extension with Counter Support  - 1-2 x daily - 7 x weekly - 1 sets - 10 reps - Sit to Stand with Arms Crossed  - 1-2 x daily - 7 x weekly - 1 sets - 10 reps - Single Leg Stance with Support  - 1 x daily - 7 x weekly - 1 sets - 3 reps - as long as possible hold  ASSESSMENT:  CLINICAL IMPRESSION:  The patient has met the majority of rehab goals, with noted improvements in pain reduction, outcome score, ROM, strength and functional mobility.  A comprehensive HEP has been established and anticipate further improvements over time with regular performance of the program.  Recommend  discharge from PT at this time.     OBJECTIVE IMPAIRMENTS: decreased activity tolerance, difficulty walking, decreased strength, impaired perceived functional ability, impaired flexibility, and pain.   ACTIVITY LIMITATIONS: standing, squatting, hygiene/grooming, and locomotion level  PARTICIPATION LIMITATIONS: meal prep, cleaning, laundry, shopping, and community activity  PERSONAL FACTORS: Time since onset of injury/illness/exacerbation and 1-2 comorbidities: history of OA, history of cellulitis   are also affecting patient's functional outcome.   REHAB POTENTIAL: Good  CLINICAL DECISION MAKING: Stable/uncomplicated  EVALUATION COMPLEXITY: Low   GOALS: Goals reviewed with patient? Yes  SHORT TERM GOALS: Target date: 11/10/2022   The patient will demonstrate knowledge of  basic self care strategies and exercises to promote healing  Baseline: Goal status: MET 11/21/2022  2.  The patient will have improved right hip strength to at least 4/5 needed for standing, walking longer distances and descending stairs at home and in the community  Baseline:  Goal status: 11/26 met  3.  Improved LE strength to rise from a standard chair without UE support 5x in <13 sec without compensations Baseline:  Goal status: 11/26 partially met 4.  The patient will have improved gait stamina and speed needed to ambulate 800 feet in 6 minutes  Baseline:  Goal status:met 11/13  LONG TERM GOALS: Target date: 12/08/2022    The patient will be independent in a safe self progression of a home exercise program to promote further recovery of function  Baseline:  Goal status: met   2.  The patient will report a 70% improvement in pain levels with functional activities which are currently difficult including standing and walking longer periods of time Baseline:  Goal status: 11/26 met  75% 3.  The patient will have improved single leg and narrow base of support balance needed for negotiating uneven pavement safety when in the community and when traveling Baseline:  Goal status: 11/26 met 4.  The patient will have improved gait stamina and speed needed to ambulate 1/2 mile for traveling Baseline:  Goal status: 11/26 met  1 mile 5.  LEFS functional outcome score improved to 62/80  Baseline:  Goal status: met 11/26    PLAN: PHYSICAL THERAPY DISCHARGE SUMMARY  Visits from Start of Care: 13  Current functional level related to goals / functional outcomes: See clinical impressions above   Remaining deficits: As above   Education / Equipment: HEP   Patient agrees to discharge. Patient goals were met. Patient is being discharged due to meeting the stated rehab goals.  Lavinia Sharps, PT 12/06/22 10:00 AM Phone: [phone] Fax:  [phone]

## 2023-01-31 NOTE — Therapy (Signed)
OUTPATIENT PHYSICAL THERAPY THORACOLUMBAR EVALUATION   Patient Name: Yolanda Peters MRN: [id number] DOB:[birth year], 83 y.o., female Today's Date: 02/01/2023  END OF SESSION:  PT End of Session - 02/01/23 1053     Visit Number 1    Date for PT Re-Evaluation 03/08/23    Authorization Type UHC Medicare/BCBS    Progress Note Due on Visit 10    PT Start Time 0930    PT Stop Time 1013    PT Time Calculation (min) 43 min    Activity Tolerance Patient tolerated treatment well    Behavior During Therapy [facility] for tasks assessed/performed             Past Medical History:  Diagnosis Date   Allergy    Arthritis    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, skin cnacer   Cancer (HCC)    right breast    Hyperlipidemia    no meds taken   Hypertension    Personal history of radiation therapy 2011   rt breast   Past Surgical History:  Procedure Laterality Date   BREAST DUCTAL SYSTEM EXCISION     right breast   BREAST LUMPECTOMY     CATARACT EXTRACTION     bilateral   CESAREAN SECTION     COLONOSCOPY     ELBOW SURGERY     left   I & D EXTREMITY Right 08/06/2020   Procedure: IRRIGATION AND DEBRIDEMENT OF LEG, wound vac placement;  Surgeon: Nadara Mustard, MD;  Location: [facility] OR;  Service: Orthopedics;  Laterality: Right;   I & D EXTREMITY Right 08/11/2020   Procedure: IRRIGATION AND DEBRIDEMENT EXTREMITY SKIN GRAFT;  Surgeon: Nadara Mustard, MD;  Location: [facility] OR;  Service: Orthopedics;  Laterality: Right;   JOINT REPLACEMENT     KNEE ARTHROPLASTY Left    March 2021   MYOMECTOMY     REPLACEMENT TOTAL KNEE Left 03/2019   TOTAL KNEE ARTHROPLASTY Right 06/26/2018   Procedure: TOTAL KNEE ARTHROPLASTY;  Surgeon: Durene Romans, MD;  Location: [facility] ORS;  Service: Orthopedics;  Laterality: Right;  70 mins   Patient Active Problem List   Diagnosis Date Noted   Ulcer of right leg, with necrosis of muscle (HCC)    Cellulitis of right leg 08/05/2020   Hypokalemia 06/27/2018   Overweight (BMI  25.0-29.9) 06/27/2018   S/P right TKA 06/26/2018   Autoimmune disease (HCC) 03/15/2016   High risk medication use 03/15/2016   Pain in joint of right shoulder 03/15/2016   Bilateral hip pain 03/15/2016   Primary osteoarthritis of both knees 03/15/2016   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 She did 10 repetitions and on the last repetition she felt a pain in her back. She could not sleep on her left side the same night. Since then her sleep has been disturbed. She wakes up about 4-6 times a night because of the pain. She went to the orthopedic doctor and the prescribed muscle relaxer and that has helped. She hasn't been to aquatic classes since then because she is afraid of making it worse. Patient denies numbness and tingling down legs. Sleep is the most limiting functional activity.  PERTINENT HISTORY:  HTN; hx bilateral knee pain; Hx breast cancer; scoliosis ; OA  PAIN:  Are you having pain? Yes: NPRS scale: 4(currently) 8-9(worst) Pain location: Right side lower lumbar side Pain description: dull Aggravating factors: laying down at night Relieving factors: muscle relaxer  PRECAUTIONS: None  RED FLAGS: None   WEIGHT BEARING RESTRICTIONS: No  FALLS:  Has  patient fallen in last 6 months? 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  DIAGNOSTIC FINDINGS:  Imaging done at [facility] but unable to see those results  PATIENT SURVEYS:  Modified Oswestry 7/50 14%   COGNITION: Overall cognitive status: Within functional limits for tasks assessed     SENSATION: WFL  MUSCLE LENGTH: Hamstring length decreased bilateral   POSTURE: rounded shoulders and forward head  PALPATION: Increased muscle spasms Rt side QL  LUMBAR ROM: * pain  AROM eval  Flexion Bends to shins (knees bent)  Extension 80% limited  Right lateral flexion Bends to above knee joint line  Left lateral flexion Bend to above knee joint line  Right rotation 50% limited*  Left rotation 40% limited   (Blank rows = not tested)  LOWER EXTREMITY ROM:   WFL bilateral   LOWER EXTREMITY MMT:    MMT Right eval Left eval  Hip flexion 4- 4-  Hip extension    Hip abduction 4- 4-  Hip adduction 4- 4-  Hip internal rotation    Hip external rotation    Knee flexion 4- 4-  Knee extension 4 4  Ankle dorsiflexion    Ankle plantarflexion    Ankle inversion    Ankle eversion     (Blank rows = not tested)    FUNCTIONAL TESTS:  5 times sit to stand: 15.07 no UE support Timed up and go (TUG): 12.45 sec 3 minute walk test: 426  GAIT: Distance walked: 426 ft Comments: decreased cadence; decreased step length; poor swing through o Lt   TREATMENT DATE:  02/01/2023 Initial Evaluation & HEP created  manual addaday to  Rt QL for improved blood flow and tissue mobility. PT present to monitor status   PATIENT EDUCATION:  Education details: POC; HEP  Person educated: Patient Education method: Explanation, Demonstration, and Handouts Education comprehension: verbalized understanding, returned demonstration, and needs further education  HOME EXERCISE PROGRAM: Access Code: [email or web address] URL: [email or web address] Date: 02/01/2023 Prepared by: Claude Manges  Exercises - Supine Lower Trunk Rotation  - 1 x daily - 7 x weekly - 2 sets - 10 reps - Supine Single Knee to Chest  - 1 x daily - 7 x weekly - 2 sets - 20-30 hold - Supine Hamstring Stretch with Strap  - 1 x daily - 7 x weekly - 2 sets - 20-30 hold - Seated Quadratus Lumborum Stretch in Chair  - 1 x daily - 7 x weekly - 2 sets - 20-30 hold  ASSESSMENT:  CLINICAL IMPRESSION: Patient is a 83 y.o. female who was seen today for physical therapy evaluation and treatment for right sided back pain. Andrey Campanile presents to therapy with increased right sided back pan after doing core exercises on her back two weeks ago. Since then her sleep has been disturbed and she has not been to her aquatic exercises. Based on evaluation noted muscle weakness, poor posture, and increased lumbar muscle spasms. Plan to address musculoskeletal abnormalities and educate patient on proper form for core exercises. Patient will benefit from skilled PT to address the below impairments and improve overall function.   OBJECTIVE IMPAIRMENTS: Abnormal gait, decreased endurance, decreased ROM, decreased strength, hypomobility, increased muscle spasms, postural dysfunction, and pain.   ACTIVITY LIMITATIONS: sleeping, transfers, and bed mobility  PARTICIPATION LIMITATIONS: cleaning, shopping, and community activity  PERSONAL FACTORS: Age and 3+ comorbidities: HTN; scoliosis ; OA  are also affecting patient's functional outcome.   REHAB POTENTIAL: Good  CLINICAL DECISION MAKING:  Stable/uncomplicated  EVALUATION COMPLEXITY: Low   GOALS: Goals reviewed with patient? Yes  SHORT TERM GOALS: Target date: 02/22/2023   Patient will be independent with initial HEP. Baseline:  Goal status: INITIAL  2.  Patient will report > or = to 40% improvement in back pain since starting PT. Baseline:  Goal status: INITIAL   3.  Patient will report > or = to 50% improvement in sleep quality since starting PT. Baseline: waking up 4-6x a night Goal status: INITIAL    LONG TERM GOALS: Target date: 03/08/2023   Patient will demonstrate independence in advanced HEP. Baseline:  Goal status: INITIAL  2.  Patient will report > or = to 60%  improvement in back pain since starting PT. Baseline:  Goal status: INITIAL  3.  Patient will be able to return to aquatic exercise classes. Baseline:  Goal status: INITIAL  4. Patient will verbalize and demonstrate self-care strategies to manage pain including tissue mobility practices and change of position Baseline:  Goal status: INITIAL  5.  Patient will ambulate > or = to 525 ft during  3 MWT Baseline: 418ft Goal status: INITIAL   PLAN:  PT FREQUENCY: 1-2x/week  PT DURATION: other: 5 weeks  PLANNED INTERVENTIONS: 97164- PT Re-evaluation, 97110-Therapeutic exercises, 97530- Therapeutic activity, 97112- Neuromuscular re-education, 97535- Self Care, 81191- Manual therapy, [id number]- Gait training, [phone]- Canalith repositioning, [id number]- Aquatic Therapy, 97014- Electrical stimulation (unattended), [id number]- Electrical stimulation (manual), [id number]- Vasopneumatic device, [id number]- Ultrasound, [id number]- Traction (mechanical), [id number]- Ionotophoresis 4mg /ml Dexamethasone, Patient/Family education, Balance training, Stair training, Dry Needling, Joint mobilization, Joint manipulation, Spinal manipulation, Spinal mobilization, Vestibular training, Cryotherapy, and Moist heat.  PLAN FOR  NEXT SESSION: Nustep; review HEP; manual as indicated; core  strengthening   Claude Manges, PT 02/01/23 10:54 AM [facility] [address] [place], [place] [address] Phone # [phone] Fax [phone]

## 2023-02-01 ENCOUNTER — Other Ambulatory Visit: Payer: Self-pay

## 2023-02-01 ENCOUNTER — Ambulatory Visit: Payer: Medicare Other | Attending: Medical | Admitting: Physical Therapy

## 2023-02-01 ENCOUNTER — Encounter: Payer: Self-pay | Admitting: Physical Therapy

## 2023-02-01 DIAGNOSIS — R293 Abnormal posture: Secondary | ICD-10-CM | POA: Diagnosis not present

## 2023-02-01 DIAGNOSIS — M6281 Muscle weakness (generalized): Secondary | ICD-10-CM | POA: Diagnosis not present

## 2023-02-01 DIAGNOSIS — M5459 Other low back pain: Secondary | ICD-10-CM | POA: Insufficient documentation

## 2023-02-01 DIAGNOSIS — M545 Low back pain, unspecified: Secondary | ICD-10-CM | POA: Diagnosis present

## 2023-02-01 DIAGNOSIS — R252 Cramp and spasm: Secondary | ICD-10-CM

## 2023-02-02 NOTE — Therapy (Signed)
OUTPATIENT PHYSICAL THERAPY THORACOLUMBAR TREATMENT   Patient Name: Yolanda Peters MRN: [id number] DOB:03-10-1940, 83 y.o., female Today's Date: 02/03/2023  END OF SESSION:  PT End of Session - 02/03/23 1106     Visit Number 2    Date for PT Re-Evaluation 03/08/23    Authorization Type UHC Medicare/BCBS    Progress Note Due on Visit 10    PT Start Time 1105    PT Stop Time 1143    PT Time Calculation (min) 38 min    Activity Tolerance Patient tolerated treatment well    Behavior During Therapy WFL for tasks assessed/performed              Past Medical History:  Diagnosis Date   Allergy    Arthritis    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, skin cnacer   Cancer (HCC)    right breast    Hyperlipidemia    no meds taken   Hypertension    Personal history of radiation therapy 2011   rt breast   Past Surgical History:  Procedure Laterality Date   BREAST DUCTAL SYSTEM EXCISION     right breast   BREAST LUMPECTOMY     CATARACT EXTRACTION     bilateral   CESAREAN SECTION     COLONOSCOPY     ELBOW SURGERY     left   I & D EXTREMITY Right 08/06/2020   Procedure: IRRIGATION AND DEBRIDEMENT OF LEG, wound vac placement;  Surgeon: Nadara Mustard, MD;  Location: [facility] OR;  Service: Orthopedics;  Laterality: Right;   I & D EXTREMITY Right 08/11/2020   Procedure: IRRIGATION AND DEBRIDEMENT EXTREMITY SKIN GRAFT;  Surgeon: Nadara Mustard, MD;  Location: [facility] OR;  Service: Orthopedics;  Laterality: Right;   JOINT REPLACEMENT     KNEE ARTHROPLASTY Left    March 2021   MYOMECTOMY     REPLACEMENT TOTAL KNEE Left 03/2019   TOTAL KNEE ARTHROPLASTY Right 06/26/2018   Procedure: TOTAL KNEE ARTHROPLASTY;  Surgeon: Durene Romans, MD;  Location: [facility] ORS;  Service: Orthopedics;  Laterality: Right;  70 mins   Patient Active Problem List   Diagnosis Date Noted   Ulcer of right leg, with necrosis of muscle (HCC)    Cellulitis of right leg 08/05/2020   Hypokalemia 06/27/2018   Overweight  (BMI 25.0-29.9) 06/27/2018   S/P right TKA 06/26/2018   Autoimmune disease (HCC) 03/15/2016   High risk medication use 03/15/2016   Pain in joint of right shoulder 03/15/2016   Bilateral hip pain 03/15/2016   Primary osteoarthritis of both knees 03/15/2016   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  Eval: Patient presents with an increased muscle spasm after doing sit ups at home. She did 10 repetitions and on the last repetition she felt a pain in her back. She could not sleep on her left side the same night. Since then her sleep has been disturbed. She wakes up about 4-6 times a night because of the pain. She went to the orthopedic doctor and the prescribed muscle relaxer and that has helped. She hasn't been to aquatic classes since then because she is afraid of making it worse. Patient denies numbness and tingling down legs. Sleep is the most limiting functional activity.  PERTINENT HISTORY:  HTN; hx bilateral knee pain; Hx breast cancer; scoliosis ; OA  PAIN:  Are you having pain? Yes: NPRS scale: 4(currently) 8-9(worst) Pain location: Right side lower lumbar side Pain description: dull Aggravating factors: laying down at night Relieving factors: muscle relaxer  PRECAUTIONS:  None  RED FLAGS: None   WEIGHT BEARING RESTRICTIONS: No  FALLS:  Has patient fallen in last 6 months? 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  DIAGNOSTIC FINDINGS:  Imaging done at [facility] but unable to see those results  PATIENT SURVEYS:  Modified Oswestry 7/50 14%   COGNITION: Overall cognitive status: Within functional limits for tasks assessed     SENSATION: WFL  MUSCLE LENGTH: Hamstring length decreased bilateral   POSTURE: rounded shoulders and forward head  PALPATION: Increased muscle spasms Rt side QL  LUMBAR ROM: * pain  AROM eval  Flexion Bends to shins (knees bent)  Extension 80% limited  Right lateral flexion Bends to above knee joint line  Left lateral flexion Bend to above knee joint line  Right rotation 50% limited*  Left rotation 40% limited   (Blank rows = not tested)  LOWER EXTREMITY ROM:   WFL bilateral   LOWER EXTREMITY MMT:    MMT Right eval Left eval  Hip flexion 4- 4-  Hip extension    Hip abduction 4- 4-  Hip adduction 4- 4-  Hip internal rotation    Hip external rotation    Knee flexion 4- 4-  Knee extension 4 4  Ankle dorsiflexion    Ankle plantarflexion    Ankle inversion    Ankle eversion     (Blank rows = not tested)    FUNCTIONAL TESTS:  5 times sit to stand: 15.07 no UE support Timed up and go (TUG): 12.45 sec 3 minute walk test: 426  GAIT: Distance walked: 426 ft Comments: decreased cadence; decreased step length; poor swing through o Lt   TREATMENT DATE:   02/03/23 Nustep L5 x 3 min then down to L3 x 2 min LTR 10 sec hold x 5 B SKTC 2x20 sec hold B HS stretch  2x30 sec Seated QL stretch 2x30 sec Prone hip ext x 10 B with pelvic press   Trigger Point Dry Needling  Initial Treatment: Pt instructed on Dry Needling rational, procedures, and possible side effects. Pt instructed to expect mild to moderate muscle soreness later in the day and/or into the next day.  Pt instructed in methods to reduce muscle soreness.  Pt instructed to continue prescribed HEP. Because Dry Needling was performed over or adjacent to a lung field, pt was educated on S/S of pneumothorax and to seek immediate medical attention should they occur.  Patient was educated on signs and symptoms of infection and other risk factors and advised to seek medical attention should they occur.  Patient verbalized understanding of these instructions and education.   Patient Verbal Consent Given: Yes Education Handout Provided: Yes Muscles Treated: R QL Electrical Stimulation Performed: No Treatment Response/Outcome: Utilized skilled palpation to identify bony landmarks and trigger points.  Able to illicit twitch response and muscle elongation.  Soft tissue mobilization to B lumbar and QL following to further promote tissue elongation and decreased pain.       02/01/2023 Initial Evaluation & HEP created                                                                                                                               manual addaday to Rt QL for improved blood flow and tissue mobility. PT present to monitor status   PATIENT EDUCATION:  Education details: POC; HEP  Person educated: Patient Education method: Explanation, Demonstration, and Handouts Education comprehension: verbalized understanding, returned demonstration, and needs further education  HOME EXERCISE PROGRAM: Access Code: [email or web address] URL: [email or web address] Date: 02/01/2023 Prepared by: Claude Manges  Exercises - Supine Lower Trunk Rotation  - 1 x daily - 7 x weekly - 2 sets - 10 reps - Supine  Single Knee to Chest  - 1 x daily - 7 x weekly - 2 sets - 20-30 hold - Supine Hamstring Stretch with Strap  - 1 x daily - 7 x weekly - 2 sets - 20-30 hold - Seated Quadratus Lumborum Stretch in Chair  - 1 x daily - 7 x weekly - 2 sets - 20-30 hold  ASSESSMENT:  CLINICAL IMPRESSION: Yolanda Peters reports ongoing R low back spasm and pain. HEP was reviewed and it seems she has not been fully compliant yet. She was educated about DN and gave consent to try it. She responded very well to initial trial and reported less pain and improved mobility after.    Eval: Patient is a 83 y.o. female who was seen today for physical therapy evaluation and treatment for right sided back pain. Yolanda Peters presents to therapy with increased right sided back pan after doing core exercises on her back two weeks ago. Since then her sleep has been disturbed and she has not been to her aquatic exercises. Based on evaluation noted muscle weakness, poor posture, and increased lumbar muscle spasms. Plan to address musculoskeletal abnormalities and educate patient on proper form for core exercises. Patient will benefit from skilled PT to address the below impairments and improve overall function.   OBJECTIVE IMPAIRMENTS: Abnormal gait, decreased endurance, decreased ROM, decreased strength, hypomobility, increased muscle spasms, postural dysfunction, and pain.   ACTIVITY LIMITATIONS: sleeping, transfers, and bed mobility  PARTICIPATION LIMITATIONS: cleaning, shopping,  and community activity  PERSONAL FACTORS: Age and 3+ comorbidities: HTN; scoliosis ; OA  are also affecting patient's functional outcome.   REHAB POTENTIAL: Good  CLINICAL DECISION MAKING: Stable/uncomplicated  EVALUATION COMPLEXITY: Low   GOALS: Goals reviewed with patient? Yes  SHORT TERM GOALS: Target date: 02/22/2023   Patient will be independent with initial HEP. Baseline:  Goal status: INITIAL  2.  Patient will report > or = to 40% improvement in back  pain since starting PT. Baseline:  Goal status: INITIAL   3.  Patient will report > or = to 50% improvement in sleep quality since starting PT. Baseline: waking up 4-6x a night Goal status: INITIAL    LONG TERM GOALS: Target date: 03/08/2023   Patient will demonstrate independence in advanced HEP. Baseline:  Goal status: INITIAL  2.  Patient will report > or = to 60%  improvement in back pain since starting PT. Baseline:  Goal status: INITIAL  3.  Patient will be able to return to aquatic exercise classes. Baseline:  Goal status: INITIAL  4. Patient will verbalize and demonstrate self-care strategies to manage pain including tissue mobility practices and change of position Baseline:  Goal status: INITIAL  5.  Patient will ambulate > or = to 525 ft during  3 MWT Baseline: 415ft Goal status: INITIAL   PLAN:  PT FREQUENCY: 1-2x/week  PT DURATION: other: 5 weeks  PLANNED INTERVENTIONS: 97164- PT Re-evaluation, 97110-Therapeutic exercises, 97530- Therapeutic activity, 97112- Neuromuscular re-education, 97535- Self Care, 86578- Manual therapy, [id number]- Gait training, [phone]- Canalith repositioning, [id number]- Aquatic Therapy, 97014- Electrical stimulation (unattended), [id number]- Electrical stimulation (manual), [id number]- Vasopneumatic device, [id number]- Ultrasound, [id number]- Traction (mechanical), [id number]- Ionotophoresis 4mg /ml Dexamethasone, Patient/Family education, Balance training, Stair training, Dry Needling, Joint mobilization, Joint manipulation, Spinal manipulation, Spinal mobilization, Vestibular training, Cryotherapy, and Moist heat.  PLAN FOR NEXT SESSION: Assess response to DN, Nustep; review HEP; manual as indicated; core strengthening   Solon Palm, PT  02/03/23 11:47 AM [facility] [address] [place], [place] [address] Phone # [phone] Fax [phone]

## 2023-02-03 ENCOUNTER — Encounter: Payer: Self-pay | Admitting: Physical Therapy

## 2023-02-03 ENCOUNTER — Ambulatory Visit: Payer: Medicare Other | Admitting: Physical Therapy

## 2023-02-03 DIAGNOSIS — R252 Cramp and spasm: Secondary | ICD-10-CM

## 2023-02-03 DIAGNOSIS — M6281 Muscle weakness (generalized): Secondary | ICD-10-CM

## 2023-02-03 DIAGNOSIS — M5459 Other low back pain: Secondary | ICD-10-CM | POA: Diagnosis not present

## 2023-02-03 DIAGNOSIS — R293 Abnormal posture: Secondary | ICD-10-CM

## 2023-02-03 NOTE — Patient Instructions (Signed)

## 2023-02-06 ENCOUNTER — Ambulatory Visit: Payer: Medicare Other | Admitting: Physical Therapy

## 2023-02-06 ENCOUNTER — Encounter: Payer: Self-pay | Admitting: Physical Therapy

## 2023-02-06 DIAGNOSIS — R252 Cramp and spasm: Secondary | ICD-10-CM

## 2023-02-06 DIAGNOSIS — R293 Abnormal posture: Secondary | ICD-10-CM

## 2023-02-06 DIAGNOSIS — M6281 Muscle weakness (generalized): Secondary | ICD-10-CM

## 2023-02-06 DIAGNOSIS — M5459 Other low back pain: Secondary | ICD-10-CM | POA: Diagnosis not present

## 2023-02-06 NOTE — Therapy (Signed)
OUTPATIENT PHYSICAL THERAPY THORACOLUMBAR TREATMENT   Patient Name: Yolanda Peters MRN: [id number] DOB:Jun 05, 1940, 83 y.o., female Today's Date: 02/06/2023  END OF SESSION:  PT End of Session - 02/06/23 1158     Visit Number 3    Date for PT Re-Evaluation 03/08/23    Authorization Type UHC Medicare/BCBS    Progress Note Due on Visit 10    PT Start Time 1105    PT Stop Time 1146    PT Time Calculation (min) 41 min    Activity Tolerance Patient tolerated treatment well    Behavior During Therapy [facility] for tasks assessed/performed               Past Medical History:  Diagnosis Date   Allergy    Arthritis    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, skin cnacer   Cancer (HCC)    right breast    Hyperlipidemia    no meds taken   Hypertension    Personal history of radiation therapy 2011   rt breast   Past Surgical History:  Procedure Laterality Date   BREAST DUCTAL SYSTEM EXCISION     right breast   BREAST LUMPECTOMY     CATARACT EXTRACTION     bilateral   CESAREAN SECTION     COLONOSCOPY     ELBOW SURGERY     left   I & D EXTREMITY Right 08/06/2020   Procedure: IRRIGATION AND DEBRIDEMENT OF LEG, wound vac placement;  Surgeon: Nadara Mustard, MD;  Location: [facility] OR;  Service: Orthopedics;  Laterality: Right;   I & D EXTREMITY Right 08/11/2020   Procedure: IRRIGATION AND DEBRIDEMENT EXTREMITY SKIN GRAFT;  Surgeon: Nadara Mustard, MD;  Location: [facility] OR;  Service: Orthopedics;  Laterality: Right;   JOINT REPLACEMENT     KNEE ARTHROPLASTY Left    March 2021   MYOMECTOMY     REPLACEMENT TOTAL KNEE Left 03/2019   TOTAL KNEE ARTHROPLASTY Right 06/26/2018   Procedure: TOTAL KNEE ARTHROPLASTY;  Surgeon: Durene Romans, MD;  Location: [facility] OR[facility];  Service: Orthopedics;  Laterality: Right;  70 mins   Patient Active Problem List   Diagnosis Date Noted   Ulcer of right leg, with necrosis of muscle (HCC)    Cellulitis of right leg 08/05/2020   Hypokalemia 06/27/2018   Overweight  (BMI 25.0-29.9) 06/27/2018   S/P right TKA 06/26/2018   Autoimmune disease (HCC) 03/15/2016   High risk medication use 03/15/2016   Pain in joint of right shoulder 03/15/2016   Bilateral hip pain 03/15/2016   Primary osteoarthritis of both knees 03/15/2016   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 She was able to sleep longer before the pain woke her up. She has been compliant with HEP.  Eval: Patient presents with an increased muscle spasm after doing sit ups at home. She did 10 repetitions and on the last repetition she felt a pain in her back. She could not sleep on her left side the same night. Since then her sleep has been disturbed. She wakes up about 4-6 times a night because of the pain. She went to the orthopedic doctor and the prescribed muscle relaxer and that has helped. She hasn't been to aquatic classes since then because she is afraid of making it worse. Patient denies numbness and tingling down legs. Sleep is the most limiting functional activity.  PERTINENT HISTORY:  HTN; hx bilateral knee pain; Hx breast cancer; scoliosis ; OA  PAIN: 02/06/2023 Are you having pain? Yes: NPRS scale: 2-3/10 Pain location: Right side lower lumbar side Pain  description: dull Aggravating factors: laying down at night Relieving factors: muscle relaxer  PRECAUTIONS: None  RED FLAGS: None   WEIGHT BEARING RESTRICTIONS: No  FALLS:  Has patient fallen in last 6 months? 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  DIAGNOSTIC FINDINGS:  Imaging done at [facility] but unable to see those results  PATIENT SURVEYS:  Modified Oswestry 7/50 14%   COGNITION: Overall cognitive status: Within functional limits for tasks assessed     SENSATION: WFL  MUSCLE LENGTH: Hamstring length decreased bilateral   POSTURE: rounded shoulders and forward head  PALPATION: Increased muscle spasms Rt side QL  LUMBAR ROM: * pain  AROM eval  Flexion Bends to shins (knees bent)  Extension 80% limited  Right lateral flexion Bends to above knee joint line  Left lateral flexion Bend to above knee joint line  Right rotation 50% limited*  Left rotation 40% limited   (Blank rows = not tested)  LOWER EXTREMITY ROM:   WFL bilateral   LOWER EXTREMITY MMT:    MMT Right eval Left eval  Hip flexion 4- 4-  Hip extension    Hip abduction 4- 4-  Hip adduction 4- 4-  Hip internal rotation    Hip external rotation    Knee flexion 4- 4-  Knee extension 4 4  Ankle dorsiflexion    Ankle plantarflexion    Ankle inversion    Ankle eversion     (Blank rows = not tested)    FUNCTIONAL TESTS:  5 times sit to stand: 15.07 no UE support Timed up and go (TUG): 12.45 sec 3 minute walk test: 426  GAIT: Distance walked: 426 ft Comments: decreased cadence; decreased step length; poor swing through o Lt   TREATMENT DATE:   02/06/23 Nustep L3 x 5 - PT present to discuss status 3 way SB stretch x 8 each Standing QL at stair (leaning to the Lt ) 2 x 20 sec Supine Glute stretch 2 x 30 sec bilateral  TFL stretch with green strap 2 x 30 sec TA activation x 20  TA activation + hip flexion x 12 each side Alt hand and knee press x 10 each direction  Hooklying hip adduction + TA activation x 20 TA activation + bent knee fall out 2 x 10 Sit to stand with 5# KB  x 10 Manual : Adday to Rt QL for improved circulation and tissue mobility. PT present to monitor status    02/03/23 Nustep L5 x 3 min then down to L3 x 2 min LTR 10 sec hold x 5 B SKTC 2x20 sec hold B HS stretch 2x30 sec Seated QL stretch 2x30 sec Prone hip ext x 10 B with pelvic press   Trigger Point Dry Needling  Initial Treatment: Pt instructed on Dry Needling rational, procedures, and possible side effects. Pt instructed to expect mild to moderate muscle soreness later in the day and/or into the next day.  Pt instructed in methods to reduce muscle soreness. Pt instructed to continue prescribed HEP. Because Dry Needling was performed over or adjacent to a lung field, pt was educated on S/S of pneumothorax and to seek immediate medical attention should they occur.  Patient was educated on signs and symptoms of infection and other risk factors and advised to seek medical attention should they occur.  Patient verbalized understanding of these instructions and education.   Patient Verbal Consent Given: Yes Education Handout Provided: Yes Muscles Treated: R QL Electrical Stimulation Performed: No Treatment Response/Outcome: Utilized skilled palpation to identify bony landmarks and trigger points.  Able to illicit twitch response and muscle elongation.  Soft tissue mobilization to B lumbar and QL following to further promote tissue elongation and decreased pain.       02/01/2023 Initial Evaluation & HEP created                                                                                                                                manual addaday to Rt QL for improved blood flow and tissue mobility. PT present to monitor status   PATIENT EDUCATION:  Education details: POC; HEP  Person educated: Patient Education method: Explanation, Demonstration, and Handouts Education comprehension: verbalized understanding, returned demonstration, and needs further education  HOME EXERCISE PROGRAM: Access Code: [email or web address] URL: [email or web address] Date: 02/06/2023 Prepared by: Claude Manges  Exercises - Supine Lower Trunk Rotation  - 1 x daily - 7 x weekly - 2 sets - 10 reps - Supine Single Knee to Chest  - 1 x daily - 7 x weekly - 2 sets - 20-30 hold - Supine Hamstring Stretch with Strap  - 1 x daily - 7 x weekly - 2 sets - 20-30 hold - Seated Quadratus Lumborum Stretch in Chair  - 1 x daily - 7 x weekly - 2 sets - 20-30 hold - Supine Transversus Abdominis Bracing - Hands on Stomach  - 1 x daily - 7 x weekly - 2 sets - 10 reps - Supine March  - 1 x daily - 7 x weekly - 1 sets - 10 reps - Bent Knee Fallouts  - 1 x daily - 7  x weekly - 2 sets - 10 reps  ASSESSMENT:  CLINICAL IMPRESSION: Yolanda Peters reports ongoing R low back spasm and pain with Rt sidelying. She did verbalize improved sleep quality after dry needling. Incorporated core strengthening and patient required verbal and visual cues for correct TA engagement. Updated patient's HEP to include these exercises. Patient verbalized compliance to HEP. Patient will benefit from skilled PT to address the below impairments and improve overall function.    Eval: Patient is a 83 y.o. female who was seen today for physical therapy evaluation and treatment for right sided back pain. Yolanda Peters presents to therapy with increased right sided back pan after doing core exercises on her back two weeks ago. Since then her sleep has been disturbed and she has not been to her aquatic exercises. Based on  evaluation noted muscle weakness, poor posture, and increased lumbar muscle spasms. Plan to address musculoskeletal abnormalities and educate patient on proper form for core exercises. Patient will benefit from skilled PT to address the below impairments and improve overall function.   OBJECTIVE IMPAIRMENTS: Abnormal gait, decreased endurance, decreased ROM, decreased strength, hypomobility, increased muscle spasms, postural dysfunction, and pain.   ACTIVITY LIMITATIONS: sleeping, transfers, and bed mobility  PARTICIPATION LIMITATIONS: cleaning, shopping, and community activity  PERSONAL FACTORS: Age and 3+ comorbidities: HTN; scoliosis ; OA  are also affecting patient's functional outcome.   REHAB POTENTIAL: Good  CLINICAL DECISION MAKING: Stable/uncomplicated  EVALUATION COMPLEXITY: Low   GOALS: Goals reviewed with patient? Yes  SHORT TERM GOALS: Target date: 02/22/2023   Patient will be independent with initial HEP. Baseline:  Goal status: INITIAL  2.  Patient will report > or = to 40% improvement in back pain since starting PT. Baseline:  Goal status: INITIAL   3.  Patient will report > or = to 50% improvement in sleep quality since starting PT. Baseline: waking up 4-6x a night Goal status: INITIAL    LONG TERM GOALS: Target date: 03/08/2023   Patient will demonstrate independence in advanced HEP. Baseline:  Goal status: INITIAL  2.  Patient will report > or = to 60%  improvement in back pain since starting PT. Baseline:  Goal status: INITIAL  3.  Patient will be able to return to aquatic exercise classes. Baseline:  Goal status: INITIAL  4. Patient will verbalize and demonstrate self-care strategies to manage pain including tissue mobility practices and change of position Baseline:  Goal status: INITIAL  5.  Patient will ambulate > or = to 525 ft during  3 MWT Baseline: 457ft Goal status: INITIAL   PLAN:  PT FREQUENCY: 1-2x/week  PT DURATION:  other: 5 weeks  PLANNED INTERVENTIONS: 97164- PT Re-evaluation, 97110-Therapeutic exercises, 97530- Therapeutic activity, 97112- Neuromuscular re-education, 97535- Self Care, 23557- Manual therapy, [id number]- Gait training, [phone]- Canalith repositioning, [id number]- Aquatic Therapy, 97014- Electrical stimulation (unattended), [id number]- Electrical stimulation (manual), [id number]- Vasopneumatic device, [id number]- Ultrasound, [id number]- Traction (mechanical), [id number]- Ionotophoresis 4mg /ml Dexamethasone, Patient/Family education, Balance training, Stair training, Dry Needling, Joint mobilization, Joint manipulation, Spinal manipulation, Spinal mobilization, Vestibular training, Cryotherapy, and Moist heat.  PLAN FOR NEXT SESSION: assess updated HEP; continue core strengthening and lumbar mobility  Claude Manges, PT 02/06/23 12:00 PM [facility] [address], [address] [place], [place] [address] Phone # [phone] Fax [phone]

## 2023-02-08 ENCOUNTER — Encounter: Payer: Self-pay | Admitting: Physical Therapy

## 2023-02-08 ENCOUNTER — Ambulatory Visit: Payer: Medicare Other | Admitting: Physical Therapy

## 2023-02-08 DIAGNOSIS — M5459 Other low back pain: Secondary | ICD-10-CM | POA: Diagnosis not present

## 2023-02-08 DIAGNOSIS — M6281 Muscle weakness (generalized): Secondary | ICD-10-CM

## 2023-02-08 DIAGNOSIS — R252 Cramp and spasm: Secondary | ICD-10-CM

## 2023-02-08 DIAGNOSIS — R293 Abnormal posture: Secondary | ICD-10-CM

## 2023-02-08 NOTE — Therapy (Signed)
OUTPATIENT PHYSICAL THERAPY THORACOLUMBAR TREATMENT   Patient Name: Yolanda Peters MRN: [id number] DOB:August 30, 1940, 83 y.o., female Today's Date: 02/08/2023  END OF SESSION:  PT End of Session - 02/08/23 1353     Visit Number 4    Date for PT Re-Evaluation 03/08/23    Authorization Type [employer] Medicare/BCBS    Progress Note Due on Visit 10    PT Start Time 1233    PT Stop Time 1314    PT Time Calculation (min) 41 min    Activity Tolerance Patient tolerated treatment well    Behavior During Therapy [facility] for tasks assessed/performed                Past Medical History:  Diagnosis Date   Allergy    Arthritis    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, skin cnacer   Cancer (HCC)    right breast    Hyperlipidemia    no meds taken   Hypertension    Personal history of radiation therapy 2011   rt breast   Past Surgical History:  Procedure Laterality Date   BREAST DUCTAL SYSTEM EXCISION     right breast   BREAST LUMPECTOMY     CATARACT EXTRACTION     bilateral   CESAREAN SECTION     COLONOSCOPY     ELBOW SURGERY     left   I & D EXTREMITY Right 08/06/2020   Procedure: IRRIGATION AND DEBRIDEMENT OF LEG, wound vac placement;  Surgeon: Nadara Mustard, MD;  Location: [facility] OR;  Service: Orthopedics;  Laterality: Right;   I & D EXTREMITY Right 08/11/2020   Procedure: IRRIGATION AND DEBRIDEMENT EXTREMITY SKIN GRAFT;  Surgeon: Nadara Mustard, MD;  Location: [facility] OR;  Service: Orthopedics;  Laterality: Right;   JOINT REPLACEMENT     KNEE ARTHROPLASTY Left    March 2021   MYOMECTOMY     REPLACEMENT TOTAL KNEE Left 03/2019   TOTAL KNEE ARTHROPLASTY Right 06/26/2018   Procedure: TOTAL KNEE ARTHROPLASTY;  Surgeon: Durene Romans, MD;  Location: [facility] ORS;  Service: Orthopedics;  Laterality: Right;  70 mins   Patient Active Problem List   Diagnosis Date Noted   Ulcer of right leg, with necrosis of muscle (HCC)    Cellulitis of right leg 08/05/2020   Hypokalemia 06/27/2018   Overweight  (BMI 25.0-29.9) 06/27/2018   S/P right TKA 06/26/2018   Autoimmune disease (HCC) 03/15/2016   High risk medication use 03/15/2016   Pain in joint of right shoulder 03/15/2016   Bilateral hip pain 03/15/2016   Primary osteoarthritis of both knees 03/15/2016   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 She also feels this pain when he lays on her right side.  Eval: Patient presents with an increased muscle spasm after doing sit ups at home. She did 10 repetitions and on the last repetition she felt a pain in her back. She could not sleep on her left side the same night. Since then her sleep has been disturbed. She wakes up about 4-6 times a night because of the pain. She went to the orthopedic doctor and the prescribed muscle relaxer and that has helped. She hasn't been to aquatic classes since then because she is afraid of making it worse. Patient denies numbness and tingling down legs. Sleep is the most limiting functional activity.  PERTINENT HISTORY:  HTN; hx bilateral knee pain; Hx breast cancer; scoliosis ; OA  PAIN: 02/08/2023 Are you having pain? Yes: NPRS scale: 0-5/10 Pain location: Right side lower lumbar side Pain description:  dull Aggravating factors: laying down at night Relieving factors: muscle relaxer  PRECAUTIONS: None  RED FLAGS: None   WEIGHT BEARING RESTRICTIONS: No  FALLS:  Has patient fallen in last 6 months? 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  DIAGNOSTIC FINDINGS:  Imaging done at [facility] but unable to see those results  PATIENT SURVEYS:  Modified Oswestry 7/50 14%   COGNITION: Overall cognitive status: Within functional limits for tasks assessed     SENSATION: WFL  MUSCLE LENGTH: Hamstring length decreased bilateral   POSTURE: rounded shoulders and forward head  PALPATION: Increased muscle spasms Rt side QL  LUMBAR ROM: * pain  AROM eval  Flexion Bends to shins (knees bent)  Extension 80% limited  Right lateral flexion Bends to above knee joint line  Left lateral flexion Bend to above knee joint line  Right rotation 50% limited*  Left rotation 40% limited   (Blank rows = not tested)  LOWER EXTREMITY ROM:   WFL bilateral   LOWER EXTREMITY MMT:    MMT Right eval Left eval  Hip flexion 4- 4-  Hip extension    Hip abduction 4- 4-  Hip adduction 4- 4-  Hip internal rotation    Hip external rotation    Knee flexion 4- 4-  Knee extension 4 4  Ankle dorsiflexion    Ankle plantarflexion    Ankle inversion    Ankle eversion     (Blank rows = not tested)    FUNCTIONAL TESTS:  5 times sit to stand: 15.07 no UE support Timed up and go (TUG): 12.45 sec 3 minute walk test: 426  GAIT: Distance walked: 426 ft Comments: decreased cadence; decreased step length; poor swing through o Lt   TREATMENT DATE:   02/08/23 Supine hip flexor stretch (Rt leg hanging off the table) 3 x 30 sec Supine LTR x 10 5 sec holds TA activation x 15 TA activation + hip flexion x 12 each side  3 way SB stretch x 8 each Sidebending "L" stretch at counter (bending to the Lt ) 6 x 5 sec hold Manual : Adday to Rt QL for improved circulation and tissue mobility. PT present to  monitor status Sidelying Rt QL stretch (3 x 20 sec)     02/06/23 Nustep L3 x 5 - PT present to discuss status 3 way SB stretch x 8 each Standing QL at stair (leaning to the Lt ) 2 x 20 sec Supine Glute stretch 2 x 30 sec bilateral  TFL stretch with green strap 2 x 30 sec TA activation x 20  TA activation + hip flexion x 12 each side Alt hand and knee press x 10 each direction Hooklying hip adduction + TA activation x 20 TA activation + bent knee fall out 2 x 10 Sit to stand with 5# KB  x 10 Manual : Adday to Rt QL for improved circulation and tissue mobility. PT present to monitor status    02/03/23 Nustep L5 x 3 min then down to L3 x 2 min LTR 10 sec hold x 5 B SKTC 2x20 sec hold B HS stretch 2x30 sec Seated QL stretch 2x30 sec Prone hip ext x 10 B with pelvic press   Trigger Point Dry Needling  Initial Treatment: Pt instructed on Dry Needling rational, procedures, and possible side effects. Pt instructed to expect mild to moderate muscle soreness later in the day and/or into the next day.  Pt instructed in methods to reduce muscle soreness. Pt instructed to continue prescribed HEP. Because Dry Needling was performed over or adjacent to a lung field, pt was educated on S/S of pneumothorax and to seek immediate medical attention should they occur.  Patient was educated on signs and symptoms of infection and other risk factors and advised to seek medical attention should they occur.  Patient verbalized understanding of these instructions and education.   Patient Verbal Consent Given: Yes Education Handout Provided: Yes Muscles  Treated: R QL Electrical Stimulation Performed: No Treatment Response/Outcome: Utilized skilled palpation to identify bony landmarks and trigger points.  Able to illicit twitch response and muscle elongation.  Soft tissue mobilization to B lumbar and QL following to further promote tissue elongation and decreased pain.       02/01/2023 Initial Evaluation & HEP created                                                                                                                               manual addaday to Rt QL for improved blood flow and tissue mobility. PT present to monitor status   PATIENT EDUCATION:  Education details: POC; HEP  Person educated: Patient Education method: Explanation, Demonstration, and Handouts Education comprehension: verbalized understanding, returned demonstration, and needs further education  HOME EXERCISE PROGRAM: Access Code: [email or web address] URL: [email or web address] Date: 02/08/2023 Prepared by: Claude Manges  Exercises - Supine Lower Trunk Rotation  - 1 x daily - 7 x weekly - 2 sets - 10 reps - Supine Single Knee to Chest  - 1 x daily - 7 x weekly - 2 sets - 20-30 hold - Supine Hamstring Stretch with Strap  - 1 x  daily - 7 x weekly - 2 sets - 20-30 hold - Seated Quadratus Lumborum Stretch in Chair  - 1 x daily - 7 x weekly - 2 sets - 20-30 hold - Supine Transversus Abdominis Bracing - Hands on Stomach  - 1 x daily - 7 x weekly - 2 sets - 10 reps - Supine March  - 1 x daily - 7 x weekly - 1 sets - 10 reps - Bent Knee Fallouts  - 1 x daily - 7 x weekly - 2 sets - 10 reps - Sidelying Quadratus Lumborum Stretch on Table  - 1 x daily - 7 x weekly - 2 sets - 20-30 hold  ASSESSMENT:  CLINICAL IMPRESSION: Andrey Campanile reports increased right sided pain on the side of her trunk that aggravates her when she flexes her trunk or lays on her Rt side. Patient did not have any tenderness to palpation when assessing right side of abdominals. Patient required verbal  and tactile cues for appropriate TA contraction. Taught patient sidelying QL stretch to perform instead of seated stretch since patient verbalized somce discomfort. Overall patient tolerated treatment session well and verbalize relief. Patient will benefit from skilled PT to address the below impairments and improve overall function.     Eval: Patient is a 83 y.o. female who was seen today for physical therapy evaluation and treatment for right sided back pain. Andrey Campanile presents to therapy with increased right sided back pan after doing core exercises on her back two weeks ago. Since then her sleep has been disturbed and she has not been to her aquatic exercises. Based on evaluation noted muscle weakness, poor posture, and increased lumbar muscle spasms. Plan to address musculoskeletal abnormalities and educate patient on proper form for core exercises. Patient will benefit from skilled PT to address the below impairments and improve overall function.   OBJECTIVE IMPAIRMENTS: Abnormal gait, decreased endurance, decreased ROM, decreased strength, hypomobility, increased muscle spasms, postural dysfunction, and pain.   ACTIVITY LIMITATIONS: sleeping, transfers, and bed mobility  PARTICIPATION LIMITATIONS: cleaning, shopping, and community activity  PERSONAL FACTORS: Age and 3+ comorbidities: HTN; scoliosis ; OA  are also affecting patient's functional outcome.   REHAB POTENTIAL: Good  CLINICAL DECISION MAKING: Stable/uncomplicated  EVALUATION COMPLEXITY: Low   GOALS: Goals reviewed with patient? Yes  SHORT TERM GOALS: Target date: 02/22/2023   Patient will be independent with initial HEP. Baseline:  Goal status: INITIAL  2.  Patient will report > or = to 40% improvement in back pain since starting PT. Baseline:  Goal status: INITIAL   3.  Patient will report > or = to 50% improvement in sleep quality since starting PT. Baseline: waking up 4-6x a night Goal status:  INITIAL    LONG TERM GOALS: Target date: 03/08/2023   Patient will demonstrate independence in advanced HEP. Baseline:  Goal status: INITIAL  2.  Patient will report > or = to 60%  improvement in back pain since starting PT. Baseline:  Goal status: INITIAL  3.  Patient will be able to return to aquatic exercise classes. Baseline:  Goal status: INITIAL  4. Patient will verbalize and demonstrate self-care strategies to manage pain including tissue mobility practices and change of position Baseline:  Goal status: INITIAL  5.  Patient will ambulate > or = to 525 ft during  3 MWT Baseline: 469ft Goal status: INITIAL   PLAN:  PT FREQUENCY: 1-2x/week  PT DURATION: other: 5 weeks  PLANNED INTERVENTIONS: 97164- PT Re-evaluation, 97110-Therapeutic exercises, 97530- Therapeutic  activity, [id number]- Neuromuscular re-education, [phone]- Self Care, 13086- Manual therapy, [id number]- Gait training, [phone]- Canalith repositioning, [id number]- Aquatic Therapy, 97014- Electrical stimulation (unattended), [id number]- Electrical stimulation (manual), [id number]- Vasopneumatic device, [id number]- Ultrasound, [id number]- Traction (mechanical), [id number]- Ionotophoresis 4mg /ml Dexamethasone, Patient/Family education, Balance training, Stair training, Dry Needling, Joint mobilization, Joint manipulation, Spinal manipulation, Spinal mobilization, Vestibular training, Cryotherapy, and Moist heat.  PLAN FOR NEXT SESSION: DN; continue core strengthening and lumbar mobility  Claude Manges, PT 02/08/23 1:53 PM [facility] [address], [address] [place], [place] [address] Phone # [phone] Fax [phone]

## 2023-02-14 ENCOUNTER — Ambulatory Visit: Payer: Medicare Other | Attending: Medical | Admitting: Physical Therapy

## 2023-02-14 ENCOUNTER — Encounter: Payer: Self-pay | Admitting: Physical Therapy

## 2023-02-14 DIAGNOSIS — M6281 Muscle weakness (generalized): Secondary | ICD-10-CM

## 2023-02-14 DIAGNOSIS — R252 Cramp and spasm: Secondary | ICD-10-CM | POA: Diagnosis present

## 2023-02-14 DIAGNOSIS — M5459 Other low back pain: Secondary | ICD-10-CM | POA: Diagnosis present

## 2023-02-14 DIAGNOSIS — R293 Abnormal posture: Secondary | ICD-10-CM | POA: Diagnosis present

## 2023-02-14 NOTE — Therapy (Signed)
 OUTPATIENT PHYSICAL THERAPY THORACOLUMBAR TREATMENT   Patient Name: Yolanda Peters MRN: [id number] DOB:[birth year], 83 y.o., female Today's Date: 02/14/2023  END OF SESSION:  PT End of Session - 02/14/23 1452     Visit Number 5    Date for PT Re-Evaluation 03/08/23    Authorization Type [employer] Medicare/BCBS    Progress Note Due on Visit 10    PT Start Time 1401    PT Stop Time 1442    PT Time Calculation (min) 41 min    Activity Tolerance Patient tolerated treatment well    Behavior During Therapy [facility] for tasks assessed/performed                 Past Medical History:  Diagnosis Date   Allergy    Arthritis    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, skin cnacer   Cancer (HCC)    right breast    Hyperlipidemia    no meds taken   Hypertension    Personal history of radiation therapy 2011   rt breast   Past Surgical History:  Procedure Laterality Date   BREAST DUCTAL SYSTEM EXCISION     right breast   BREAST LUMPECTOMY     CATARACT EXTRACTION     bilateral   CESAREAN SECTION     COLONOSCOPY     ELBOW SURGERY     left   I & D EXTREMITY Right 08/06/2020   Procedure: IRRIGATION AND DEBRIDEMENT OF LEG, wound vac placement;  Surgeon: Harden Jerona GAILS, MD;  Location: [facility] OR;  Service: Orthopedics;  Laterality: Right;   I & D EXTREMITY Right 08/11/2020   Procedure: IRRIGATION AND DEBRIDEMENT EXTREMITY SKIN GRAFT;  Surgeon: Harden Jerona GAILS, MD;  Location: [facility] OR;  Service: Orthopedics;  Laterality: Right;   JOINT REPLACEMENT     KNEE ARTHROPLASTY Left    March 2021   MYOMECTOMY     REPLACEMENT TOTAL KNEE Left 03/2019   TOTAL KNEE ARTHROPLASTY Right 06/26/2018   Procedure: TOTAL KNEE ARTHROPLASTY;  Surgeon: Ernie Cough, MD;  Location: [facility] ORS;  Service: Orthopedics;  Laterality: Right;  70 mins   Patient Active Problem List   Diagnosis Date Noted   Ulcer of right leg, with necrosis of muscle (HCC)    Cellulitis of right leg 08/05/2020   Hypokalemia 06/27/2018    Overweight (BMI 25.0-29.9) 06/27/2018   S/P right TKA 06/26/2018   Autoimmune disease (HCC) 03/15/2016   High risk medication use 03/15/2016   Pain in joint of right shoulder 03/15/2016   Bilateral hip pain 03/15/2016   Primary osteoarthritis of both knees 03/15/2016   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 She did water  aerobics and went to a massage therapist over the weekend. She currently just feels an ache.  Eval: Patient presents with an increased muscle spasm after doing sit ups at home. She did 10 repetitions and on the last repetition she felt a pain in her back. She could not sleep on her left side the same night. Since then her sleep has been disturbed. She wakes up about 4-6 times a night because of the pain. She went to the orthopedic doctor and the prescribed muscle relaxer and that has helped. She hasn't been to aquatic classes since then because she is afraid of making it worse. Patient denies numbness and tingling down legs. Sleep is the most limiting functional activity.  PERTINENT HISTORY:  HTN; hx bilateral knee pain; Hx breast cancer; scoliosis ; OA  PAIN: 02/14/2023 Are you having pain? Yes: NPRS scale: 0/10 Pain location: Right side lower lumbar side Pain description:  dull Aggravating factors: laying down at night Relieving factors: muscle relaxer  PRECAUTIONS: None  RED FLAGS: None   WEIGHT BEARING RESTRICTIONS: No  FALLS:  Has patient fallen in last 6 months? 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  DIAGNOSTIC FINDINGS:  Imaging done at [facility] but unable to see those results  PATIENT SURVEYS:  Modified Oswestry 7/50 14%   COGNITION: Overall cognitive status: Within functional limits for tasks assessed     SENSATION: WFL  MUSCLE LENGTH: Hamstring length decreased bilateral   POSTURE: rounded shoulders and forward head  PALPATION: Increased muscle spasms Rt side QL  LUMBAR ROM: * pain  AROM eval  Flexion Bends to shins (knees bent)  Extension 80% limited  Right lateral flexion Bends to above knee joint line  Left lateral flexion Bend to above knee joint line  Right rotation 50% limited*  Left rotation 40% limited   (Blank rows = not tested)  LOWER EXTREMITY ROM:   WFL bilateral   LOWER EXTREMITY MMT:    MMT Right eval Left eval  Hip flexion 4- 4-  Hip extension    Hip abduction 4- 4-  Hip adduction 4- 4-  Hip internal rotation    Hip external rotation    Knee flexion 4- 4-  Knee extension 4 4  Ankle dorsiflexion    Ankle plantarflexion    Ankle inversion    Ankle eversion     (Blank rows = not tested)    FUNCTIONAL TESTS:  5 times sit to stand: 15.07 no UE support Timed up and go (TUG): 12.45 sec 3 minute walk test: 426  GAIT: Distance walked: 426 ft Comments: decreased cadence; decreased step length; poor swing through o Lt   TREATMENT DATE:  02/14/23 QL  stretch on peanut ball 4 x 5 sec holds Supine LR x 10 5 sec holds Supine TFL stretch with green strap 3 x 20 sec Rt  Supine hip flexion stretch 2 x 20 sec bilateral  TA activation x 10 TA activation + bent knee fall out x 10 bilateral  Seated alt hand and knee press x 10 3 sec holds Education on log rolling technique  Seated ear to hip with yellow  plyo ball x 10 each direction Pallof press with red TB x 15 each direction Iso shoulder extension with red TB + hip flexion x 10 each leg 3 way SB stretch x 8 each Manual : Adday to Rt QL for improved circulation and tissue mobility. PT present to monitor status     02/08/23 Supine hip flexor stretch (Rt leg hanging off the table) 3 x 30 sec Supine LTR x 10 5 sec holds TA activation x 15 TA activation + hip flexion x 12 each side  3 way SB stretch x 8 each Sidebending L stretch at counter (bending to the Lt ) 6 x 5 sec hold Manual : Adday to Rt QL for improved circulation and tissue mobility. PT present to monitor status Sidelying Rt QL stretch (3 x 20 sec)     02/06/23 Nustep L3 x 5 - PT present to discuss status 3 way SB stretch x 8 each Standing QL at stair (leaning to the Lt ) 2 x 20 sec Supine Glute stretch 2 x 30 sec bilateral  TFL stretch with green strap 2 x 30 sec TA activation x 20  TA activation + hip flexion x 12 each side Alt hand and knee press x 10 each direction Hooklying hip adduction + TA activation x 20 TA activation + bent knee fall out 2 x 10 Sit to stand with 5# KB  x 10 Manual : Adday to Rt QL for improved circulation and tissue mobility. PT present to monitor status    02/03/23 Nustep L5 x 3 min then down to L3 x 2 min LTR 10 sec hold x 5 B SKTC 2x20 sec hold B HS stretch 2x30 sec Seated QL stretch 2x30 sec Prone hip ext x 10 B with pelvic press   Trigger Point Dry Needling  Initial Treatment: Pt instructed on Dry Needling rational, procedures, and possible side effects. Pt instructed to expect  mild to moderate muscle soreness later in the day and/or into the next day.  Pt instructed in methods to reduce muscle soreness. Pt instructed to continue prescribed HEP. Because Dry Needling was performed over or adjacent to a lung field, pt was educated on S/S of pneumothorax and to seek immediate medical attention should they occur.  Patient was educated on signs and symptoms of infection and other risk factors and advised to seek medical attention should they occur.  Patient verbalized understanding of these instructions and education.   Patient Verbal Consent Given: Yes Education Handout Provided: Yes Muscles Treated: R QL Electrical Stimulation Performed: No Treatment Response/Outcome: Utilized skilled palpation to identify bony landmarks and trigger points.  Able to illicit twitch response and muscle elongation.  Soft tissue mobilization to B lumbar and QL following to further promote tissue elongation and decreased pain.       02/01/2023 Initial Evaluation & HEP created  manual addaday to Rt QL for improved blood flow and tissue mobility. PT present to monitor status   PATIENT EDUCATION:  Education details: POC; HEP  Person educated: Patient Education method: Explanation, Demonstration, and Handouts Education comprehension: verbalized understanding, returned demonstration, and needs further education  HOME EXERCISE PROGRAM: Access Code: [email or web address] URL: [email or web address] Date: 02/08/2023 Prepared by: Kristeen Sar  Exercises - Supine Lower Trunk Rotation  - 1 x daily - 7 x weekly - 2 sets - 10 reps - Supine Single Knee to Chest  - 1 x daily - 7 x weekly - 2 sets - 20-30 hold - Supine Hamstring Stretch with Strap  - 1 x daily - 7 x weekly - 2 sets - 20-30 hold - Seated Quadratus Lumborum Stretch in Chair  - 1 x daily - 7 x weekly - 2 sets -  20-30 hold - Supine Transversus Abdominis Bracing - Hands on Stomach  - 1 x daily - 7 x weekly - 2 sets - 10 reps - Supine March  - 1 x daily - 7 x weekly - 1 sets - 10 reps - Bent Knee Fallouts  - 1 x daily - 7 x weekly - 2 sets - 10 reps - Sidelying Quadratus Lumborum Stretch on Table  - 1 x daily - 7 x weekly - 2 sets - 20-30 hold  ASSESSMENT:  CLINICAL IMPRESSION: Particia reports that her back pain has improved. This past weekend she went to a massage therapist and she went to an aquatics class. Patient required reminders to use log roll technique when sitting up from plinth. Incorporated core progressions and patient required verbal and visual cues for correct exercise performance. Patient will benefit from skilled PT to address the below impairments and improve overall function.       Eval: Patient is a 83 y.o. female who was seen today for physical therapy evaluation and treatment for right sided back pain. Particia presents to therapy with increased right sided back pan after doing core exercises on her back two weeks ago. Since then her sleep has been disturbed and she has not been to her aquatic exercises. Based on evaluation noted muscle weakness, poor posture, and increased lumbar muscle spasms. Plan to address musculoskeletal abnormalities and educate patient on proper form for core exercises. Patient will benefit from skilled PT to address the below impairments and improve overall function.   OBJECTIVE IMPAIRMENTS: Abnormal gait, decreased endurance, decreased ROM, decreased strength, hypomobility, increased muscle spasms, postural dysfunction, and pain.   ACTIVITY LIMITATIONS: sleeping, transfers, and bed mobility  PARTICIPATION LIMITATIONS: cleaning, shopping, and community activity  PERSONAL FACTORS: Age and 3+ comorbidities: HTN; scoliosis ; OA  are also affecting patient's functional outcome.   REHAB POTENTIAL: Good  CLINICAL DECISION MAKING:  Stable/uncomplicated  EVALUATION COMPLEXITY: Low   GOALS: Goals reviewed with patient? Yes  SHORT TERM GOALS: Target date: 02/22/2023   Patient will be independent with initial HEP. Baseline:  Goal status: INITIAL  2.  Patient will report > or = to 40% improvement in back pain since starting PT. Baseline:  Goal status: INITIAL   3.  Patient will report > or = to 50% improvement in sleep quality since starting PT. Baseline: waking up 4-6x a night Goal status: INITIAL    LONG TERM GOALS: Target date: 03/08/2023   Patient will demonstrate independence in advanced HEP. Baseline:  Goal status: INITIAL  2.  Patient will report > or = to 60%  improvement in back pain since starting PT.  Baseline:  Goal status: INITIAL  3.  Patient will be able to return to aquatic exercise classes. Baseline:  Goal status: INITIAL  4. Patient will verbalize and demonstrate self-care strategies to manage pain including tissue mobility practices and change of position Baseline:  Goal status: INITIAL  5.  Patient will ambulate > or = to 525 ft during  3 MWT Baseline: 424ft Goal status: INITIAL   PLAN:  PT FREQUENCY: 1-2x/week  PT DURATION: other: 5 weeks  PLANNED INTERVENTIONS: 97164- PT Re-evaluation, 97110-Therapeutic exercises, 97530- Therapeutic activity, 97112- Neuromuscular re-education, 97535- Self Care, [id number]- Manual therapy, [id number]- Gait training, [phone]- Canalith repositioning, [id number]- Aquatic Therapy, 97014- Electrical stimulation (unattended), [id number]- Electrical stimulation (manual), [id number]- Vasopneumatic device, [id number]- Ultrasound, [id number]- Traction (mechanical), [id number]- Ionotophoresis 4mg /ml Dexamethasone , Patient/Family education, Balance training, Stair training, Dry Needling, Joint mobilization, Joint manipulation, Spinal manipulation, Spinal mobilization, Vestibular training, Cryotherapy, and Moist heat.  PLAN FOR NEXT SESSION: DN as another PT is available ; update HEP to include  core progressions  Kristeen Sar, PT 02/14/23 2:52 PM [facility] [address], [address] [place], [place] [address] Phone # [phone] Fax [phone]

## 2023-02-16 ENCOUNTER — Encounter: Payer: Self-pay | Admitting: Physical Therapy

## 2023-02-16 ENCOUNTER — Ambulatory Visit: Payer: Medicare Other | Admitting: Physical Therapy

## 2023-02-16 DIAGNOSIS — M5459 Other low back pain: Secondary | ICD-10-CM

## 2023-02-16 DIAGNOSIS — R293 Abnormal posture: Secondary | ICD-10-CM

## 2023-02-16 DIAGNOSIS — R252 Cramp and spasm: Secondary | ICD-10-CM

## 2023-02-16 DIAGNOSIS — M6281 Muscle weakness (generalized): Secondary | ICD-10-CM

## 2023-02-16 NOTE — Therapy (Signed)
 OUTPATIENT PHYSICAL THERAPY THORACOLUMBAR TREATMENT   Patient Name: ADELYNNE JOERGER MRN: [id number] DOB:March 31, 1940, 83 y.o., female Today's Date: 02/16/2023  END OF SESSION:  PT End of Session - 02/16/23 1316     Visit Number 6    Date for PT Re-Evaluation 03/08/23    Authorization Type [employer] Medicare/BCBS    Authorization Time Period 02/01/2023-05/17/2023    Authorization - Visit Number 6    Authorization - Number of Visits 10    Progress Note Due on Visit 10    PT Start Time 1230    PT Stop Time 1311    PT Time Calculation (min) 41 min    Activity Tolerance Patient tolerated treatment well    Behavior During Therapy [facility] for tasks assessed/performed                  Past Medical History:  Diagnosis Date   Allergy    Arthritis    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, skin cnacer   Cancer (HCC)    right breast    Hyperlipidemia    no meds taken   Hypertension    Personal history of radiation therapy 2011   rt breast   Past Surgical History:  Procedure Laterality Date   BREAST DUCTAL SYSTEM EXCISION     right breast   BREAST LUMPECTOMY     CATARACT EXTRACTION     bilateral   CESAREAN SECTION     COLONOSCOPY     ELBOW SURGERY     left   I & D EXTREMITY Right 08/06/2020   Procedure: IRRIGATION AND DEBRIDEMENT OF LEG, wound vac placement;  Surgeon: Harden Jerona GAILS, MD;  Location: [facility] OR;  Service: Orthopedics;  Laterality: Right;   I & D EXTREMITY Right 08/11/2020   Procedure: IRRIGATION AND DEBRIDEMENT EXTREMITY SKIN GRAFT;  Surgeon: Harden Jerona GAILS, MD;  Location: [facility] OR;  Service: Orthopedics;  Laterality: Right;   JOINT REPLACEMENT     KNEE ARTHROPLASTY Left    March 2021   MYOMECTOMY     REPLACEMENT TOTAL KNEE Left 03/2019   TOTAL KNEE ARTHROPLASTY Right 06/26/2018   Procedure: TOTAL KNEE ARTHROPLASTY;  Surgeon: Ernie Cough, MD;  Location: [facility] ORS;  Service: Orthopedics;  Laterality: Right;  70 mins   Patient Active Problem List   Diagnosis Date Noted    Ulcer of right leg, with necrosis of muscle (HCC)    Cellulitis of right leg 08/05/2020   Hypokalemia 06/27/2018   Overweight (BMI 25.0-29.9) 06/27/2018   S/P right TKA 06/26/2018   Autoimmune disease (HCC) 03/15/2016   High risk medication use 03/15/2016   Pain in joint of right shoulder 03/15/2016   Bilateral hip pain 03/15/2016   Primary osteoarthritis of both knees 03/15/2016   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 She went to the pool today and she walked for 45 mins.  Eval: Patient presents with an increased muscle spasm after doing sit ups at home. She did 10 repetitions and on the last repetition she felt a pain in her back. She could not sleep on her left side the same night. Since then her sleep has been disturbed. She wakes up about 4-6 times a night because of the pain. She went to the orthopedic doctor and the prescribed muscle relaxer and that has helped. She hasn't been to aquatic classes since then because she is afraid of making it worse. Patient denies numbness and tingling down legs. Sleep is the most limiting functional activity.  PERTINENT HISTORY:  HTN; hx bilateral knee pain; Hx breast cancer; scoliosis ; OA  PAIN: 02/16/2023 Are you having pain? Yes: NPRS scale:  0/10 Pain location: Right side lower lumbar side Pain description: dull Aggravating factors: laying down at night Relieving factors: muscle relaxer  PRECAUTIONS: None  RED FLAGS: None   WEIGHT BEARING RESTRICTIONS: No  FALLS:  Has patient fallen in last 6 months? 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  DIAGNOSTIC FINDINGS:  Imaging done at [facility] but unable to see those results  PATIENT SURVEYS:  Modified Oswestry 7/50 14%   COGNITION: Overall cognitive status: Within functional limits for tasks assessed     SENSATION: WFL  MUSCLE LENGTH: Hamstring length decreased bilateral   POSTURE: rounded shoulders and forward head  PALPATION: Increased muscle spasms Rt side QL  LUMBAR ROM: * pain  AROM eval  Flexion Bends to shins (knees bent)  Extension 80% limited  Right lateral flexion Bends to above knee joint line  Left lateral flexion Bend to above knee joint line  Right rotation 50% limited*  Left rotation 40% limited   (Blank rows = not tested)  LOWER EXTREMITY ROM:   WFL bilateral   LOWER EXTREMITY MMT:    MMT Right eval Left eval  Hip flexion 4- 4-  Hip extension    Hip abduction 4- 4-  Hip adduction 4- 4-  Hip internal rotation    Hip external rotation    Knee flexion 4- 4-  Knee extension 4 4  Ankle dorsiflexion    Ankle plantarflexion    Ankle inversion    Ankle eversion     (Blank rows = not tested)    FUNCTIONAL TESTS:  5 times sit to stand: 15.07 no UE support Timed up and go (TUG): 12.45 sec 3 minute walk test: 426  GAIT: Distance walked: 426 ft Comments: decreased cadence; decreased  step length; poor swing through o Lt   TREATMENT DATE:  02/16/23 TA activation + SLR x 10 bilateral  TA activation + hip flexion in hooklying with blue loop around thighs 2 x 10 bilateral  TA activation + hip adduction x 20 Seated ear to hip with yellow plyo ball x 10 each direction Standing chest press with 5# DB x 10  Pallof press with red TB x 15 each direction Iso shoulder extension with red TB + hip flexion x 10 each leg Farmer's carries holding 5# DB x 1  lap around clinic Pallof press with red TB x 15 each direction Iso shoulder extension with red TB + hip flexion x 10 each leg Hip flexion + unilateral 5# DB hold 2 x 10 bilateral     02/14/23 QL stretch on peanut ball 4 x 5 sec holds Supine LR x 10 5 sec holds Supine TFL stretch with green strap 3 x 20 sec Rt  Supine hip flexion stretch 2 x 20 sec bilateral  TA activation x 10 TA activation + bent knee fall out x 10 bilateral  Seated alt hand and knee press x 10 3 sec holds Education on log rolling technique  Seated ear to hip with yellow plyo ball x 10 each direction Pallof press with red TB x 15 each direction Iso shoulder extension with red TB + hip flexion x 10 each leg 3 way SB stretch x 8 each Manual : Adday to Rt QL for improved circulation and tissue mobility. PT present to monitor status     02/08/23 Supine hip flexor stretch (Rt leg hanging off the table) 3 x 30 sec Supine LTR x 10 5 sec holds TA activation x 15 TA activation + hip flexion x 12 each side  3 way SB stretch x 8 each Sidebending L stretch at counter (bending to the Lt ) 6 x 5 sec hold Manual : Adday to Rt QL for improved circulation and tissue mobility. PT present to monitor status Sidelying Rt QL stretch (3 x 20 sec)     02/06/23 Nustep L3 x 5 - PT present to discuss status 3 way SB stretch x 8 each Standing QL at stair (leaning to the Lt ) 2 x 20 sec Supine Glute stretch 2 x 30 sec bilateral  TFL stretch with green strap 2 x 30 sec TA  activation x 20  TA activation + hip flexion x 12 each side Alt hand and knee press x 10 each direction Hooklying hip adduction + TA activation x 20 TA activation + bent knee fall out 2 x 10 Sit to stand with 5# KB  x 10 Manual : Adday to Rt QL for improved circulation and tissue mobility. PT present to monitor status    02/03/23 Nustep L5 x 3 min then down to L3 x 2 min LTR 10 sec hold x 5 B SKTC 2x20 sec hold B HS stretch 2x30 sec Seated QL stretch 2x30 sec Prone hip ext x 10 B with pelvic press   Trigger Point Dry Needling  Initial Treatment: Pt instructed on Dry Needling rational, procedures, and possible side effects. Pt instructed to expect mild to moderate muscle soreness later in the day and/or into the next day.  Pt instructed in methods to reduce muscle soreness. Pt instructed to continue prescribed HEP. Because Dry Needling was performed over or adjacent to a lung field, pt was educated on S/S of pneumothorax and to seek immediate medical attention should they occur.  Patient was educated on signs and symptoms of infection and other risk factors and advised to seek medical attention should they occur.  Patient verbalized understanding of these instructions and education.   Patient Verbal Consent Given: Yes Education Handout Provided: Yes Muscles Treated: R QL Electrical Stimulation Performed: No Treatment Response/Outcome: Utilized skilled palpation to identify bony landmarks and trigger points.  Able to illicit twitch response and muscle elongation.  Soft tissue mobilization to B lumbar and QL following to further promote tissue elongation and decreased pain.  02/01/2023 Initial Evaluation & HEP created                                                                                                                               manual addaday to Rt QL for improved blood flow and tissue mobility. PT present to monitor status   PATIENT EDUCATION:  Education  details: POC; HEP  Person educated: Patient Education method: Explanation, Demonstration, and Handouts Education comprehension: verbalized understanding, returned demonstration, and needs further education  HOME EXERCISE PROGRAM: Access Code: [email or web address] URL: [email or web address] Date: 02/16/2023 Prepared by: Kristeen Sar  Exercises - Supine Lower Trunk Rotation  - 1 x daily - 7 x weekly - 2 sets - 10 reps - Supine Single Knee to Chest  - 1 x daily - 7 x weekly - 2 sets - 20-30 hold - Supine Hamstring Stretch with Strap  - 1 x daily - 7 x weekly - 2 sets - 20-30 hold - Seated Quadratus Lumborum Stretch in Chair  - 1 x daily - 7 x weekly - 2 sets - 20-30 hold - Supine Transversus Abdominis Bracing - Hands on Stomach  - 1 x daily - 7 x weekly - 2 sets - 10 reps - Supine March  - 1 x daily - 7 x weekly - 1 sets - 10 reps - Bent Knee Fallouts  - 1 x daily - 7 x weekly - 2 sets - 10 reps - Sidelying Quadratus Lumborum Stretch on Table  - 1 x daily - 7 x weekly - 2 sets - 20-30 hold - Small Range Straight Leg Raise  - 1 x daily - 7 x weekly - 1 sets - 10 reps - Supine Hip Adduction Isometric with Ball  - 1 x daily - 7 x weekly - 2 sets - 10 reps - Supine March with Resistance Band  - 1 x daily - 7 x weekly - 2 sets - 10 reps  ASSESSMENT:  CLINICAL IMPRESSION: Particia continues to report no back pain levels. She has started walking in the pool and that has helped her pain tremendously. Updated patient's HEP to include core progressions. Patient required occasional verbal cues to maintain TA contraction. Patient demonstrated good carry over of log roll technique when completing supine to sit. Patient will benefit from skilled PT to address the below impairments and improve overall function.      Eval: Patient is a 83 y.o. female who was seen today for physical therapy evaluation and treatment for right sided back pain. Particia presents to therapy with increased right sided back pan  after doing core exercises on her back two weeks ago. Since then her sleep has been disturbed and she has not been to her aquatic exercises. Based on evaluation noted muscle weakness, poor posture, and increased lumbar muscle spasms. Plan to address musculoskeletal abnormalities and educate patient on proper form for core exercises. Patient will benefit from skilled PT to address the below  impairments and improve overall function.   OBJECTIVE IMPAIRMENTS: Abnormal gait, decreased endurance, decreased ROM, decreased strength, hypomobility, increased muscle spasms, postural dysfunction, and pain.   ACTIVITY LIMITATIONS: sleeping, transfers, and bed mobility  PARTICIPATION LIMITATIONS: cleaning, shopping, and community activity  PERSONAL FACTORS: Age and 3+ comorbidities: HTN; scoliosis ; OA  are also affecting patient's functional outcome.   REHAB POTENTIAL: Good  CLINICAL DECISION MAKING: Stable/uncomplicated  EVALUATION COMPLEXITY: Low   GOALS: Goals reviewed with patient? Yes  SHORT TERM GOALS: Target date: 02/22/2023   Patient will be independent with initial HEP. Baseline:  Goal status: INITIAL  2.  Patient will report > or = to 40% improvement in back pain since starting PT. Baseline:  Goal status: INITIAL   3.  Patient will report > or = to 50% improvement in sleep quality since starting PT. Baseline: waking up 4-6x a night Goal status: INITIAL    LONG TERM GOALS: Target date: 03/08/2023   Patient will demonstrate independence in advanced HEP. Baseline:  Goal status: INITIAL  2.  Patient will report > or = to 60%  improvement in back pain since starting PT. Baseline:  Goal status: INITIAL  3.  Patient will be able to return to aquatic exercise classes. Baseline:  Goal status: INITIAL  4. Patient will verbalize and demonstrate self-care strategies to manage pain including tissue mobility practices and change of position Baseline:  Goal status: INITIAL  5.   Patient will ambulate > or = to 525 ft during  3 MWT Baseline: 487ft Goal status: INITIAL   PLAN:  PT FREQUENCY: 1-2x/week  PT DURATION: other: 5 weeks  PLANNED INTERVENTIONS: 97164- PT Re-evaluation, 97110-Therapeutic exercises, 97530- Therapeutic activity, 97112- Neuromuscular re-education, 97535- Self Care, [id number]- Manual therapy, [id number]- Gait training, [phone]- Canalith repositioning, [id number]- Aquatic Therapy, 97014- Electrical stimulation (unattended), [id number]- Electrical stimulation (manual), [id number]- Vasopneumatic device, [id number]- Ultrasound, [id number]- Traction (mechanical), [id number]- Ionotophoresis 4mg /ml Dexamethasone , Patient/Family education, Balance training, Stair training, Dry Needling, Joint mobilization, Joint manipulation, Spinal manipulation, Spinal mobilization, Vestibular training, Cryotherapy, and Moist heat.  PLAN FOR NEXT SESSION: assess updated HEP; DN as another PT is available   Kristeen Sar, PT 02/16/23 1:17 PM [facility] [address] [place], [place] [address] Phone # [phone] Fax [phone]

## 2023-02-20 ENCOUNTER — Ambulatory Visit: Payer: Medicare Other | Admitting: Physical Therapy

## 2023-02-20 ENCOUNTER — Encounter: Payer: Self-pay | Admitting: Physical Therapy

## 2023-02-20 DIAGNOSIS — R293 Abnormal posture: Secondary | ICD-10-CM

## 2023-02-20 DIAGNOSIS — R252 Cramp and spasm: Secondary | ICD-10-CM

## 2023-02-20 DIAGNOSIS — M5459 Other low back pain: Secondary | ICD-10-CM | POA: Diagnosis not present

## 2023-02-20 DIAGNOSIS — M6281 Muscle weakness (generalized): Secondary | ICD-10-CM

## 2023-02-20 NOTE — Therapy (Signed)
 OUTPATIENT PHYSICAL THERAPY THORACOLUMBAR TREATMENT   Patient Name: Yolanda Peters MRN: [id number] DOB:1940-01-18, 83 y.o., female Today's Date: 02/20/2023  END OF SESSION:  PT End of Session - 02/20/23 1155     Visit Number 7    Date for PT Re-Evaluation 03/08/23    Authorization Type [employer] Medicare/BCBS    Authorization Time Period 02/01/2023-05/17/2023    Authorization - Visit Number 7    Authorization - Number of Visits 10    Progress Note Due on Visit 10    PT Start Time 1115   patient was late to appointment   PT Stop Time 1145    PT Time Calculation (min) 30 min    Activity Tolerance Patient tolerated treatment well    Behavior During Therapy [facility] for tasks assessed/performed                   Past Medical History:  Diagnosis Date   Allergy    Arthritis    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, skin cnacer   Cancer (HCC)    right breast    Hyperlipidemia    no meds taken   Hypertension    Personal history of radiation therapy 2011   rt breast   Past Surgical History:  Procedure Laterality Date   BREAST DUCTAL SYSTEM EXCISION     right breast   BREAST LUMPECTOMY     CATARACT EXTRACTION     bilateral   CESAREAN SECTION     COLONOSCOPY     ELBOW SURGERY     left   I & D EXTREMITY Right 08/06/2020   Procedure: IRRIGATION AND DEBRIDEMENT OF LEG, wound vac placement;  Surgeon: Timothy Ford, MD;  Location: [facility] OR;  Service: Orthopedics;  Laterality: Right;   I & D EXTREMITY Right 08/11/2020   Procedure: IRRIGATION AND DEBRIDEMENT EXTREMITY SKIN GRAFT;  Surgeon: Timothy Ford, MD;  Location: [facility] OR;  Service: Orthopedics;  Laterality: Right;   JOINT REPLACEMENT     KNEE ARTHROPLASTY Left    March 2021   MYOMECTOMY     REPLACEMENT TOTAL KNEE Left 03/2019   TOTAL KNEE ARTHROPLASTY Right 06/26/2018   Procedure: TOTAL KNEE ARTHROPLASTY;  Surgeon: Claiborne Crew, MD;  Location: [facility] ORS;  Service: Orthopedics;  Laterality: Right;  70 mins   Patient Active  Problem List   Diagnosis Date Noted   Ulcer of right leg, with necrosis of muscle (HCC)    Cellulitis of right leg 08/05/2020   Hypokalemia 06/27/2018   Overweight (BMI 25.0-29.9) 06/27/2018   S/P right TKA 06/26/2018   Autoimmune disease (HCC) 03/15/2016   High risk medication use 03/15/2016   Pain in joint of right shoulder 03/15/2016   Bilateral hip pain 03/15/2016   Primary osteoarthritis of both knees 03/15/2016   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 She occasionally has sharp at night in her back.  Eval: Patient presents with an increased muscle spasm after doing sit ups at home. She did 10 repetitions and on the last repetition she felt a pain in her back. She could not sleep on her left side the same night. Since then her sleep has been disturbed. She wakes up about 4-6 times a night because of the pain. She went to the orthopedic doctor and the prescribed muscle relaxer and that has helped. She hasn't been to aquatic classes since then because she is afraid of making it worse. Patient denies numbness and tingling down legs. Sleep is the most limiting functional activity.  PERTINENT HISTORY:  HTN; hx bilateral knee pain; Hx breast cancer; scoliosis ; OA  PAIN: 02/20/2023 Are  you having pain? Yes: NPRS scale: 0/10 Pain location: Right side lower lumbar side Pain description: dull Aggravating factors: laying down at night Relieving factors: muscle relaxer  PRECAUTIONS: None  RED FLAGS: None   WEIGHT BEARING RESTRICTIONS: No  FALLS:  Has patient fallen in last 6 months? 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  DIAGNOSTIC FINDINGS:  Imaging done at [facility] but unable to see those results  PATIENT SURVEYS:  Modified Oswestry 7/50 14%   COGNITION: Overall cognitive status: Within functional limits for tasks assessed     SENSATION: WFL  MUSCLE LENGTH: Hamstring length decreased bilateral   POSTURE: rounded shoulders and forward head  PALPATION: Increased muscle spasms Rt side QL  LUMBAR ROM: * pain  AROM eval  Flexion Bends to shins (knees bent)  Extension 80% limited  Right lateral flexion Bends to above knee joint line  Left lateral flexion Bend to above knee joint line  Right rotation 50% limited*  Left rotation 40% limited   (Blank rows = not tested)  LOWER EXTREMITY ROM:   WFL bilateral   LOWER EXTREMITY MMT:    MMT Right eval Left eval  Hip flexion 4- 4-  Hip extension    Hip abduction 4- 4-  Hip adduction 4- 4-  Hip internal rotation    Hip external rotation    Knee flexion 4- 4-  Knee extension 4 4  Ankle dorsiflexion    Ankle plantarflexion    Ankle inversion    Ankle eversion     (Blank rows = not tested)    FUNCTIONAL TESTS:  5 times sit to stand: 15.07 no UE support Timed up and go (TUG): 12.45 sec 3 minute walk test: 426  GAIT: Distance walked: 426  ft Comments: decreased cadence; decreased step length; poor swing through o Lt   TREATMENT DATE:  02/20/23- Patient was 15 mins late to appointment 3 way SB stretch x 8 each direction Seated ear to hip with yellow plyo ball x 10 each direction Standing chest press with yellow plyo ball DB x 10  Supine TA activation + SLR x 10 bilateral  TA activation + hip flexion  2 x 10 bilateral  Hooklying TA activation + hip adduction x 20 Farmer's carries holding 5# DB x 1 lap around clinic Pallof press with red TB x 15 each  direction Iso shoulder extension with red TB + hip flexion x 10 each leg   02/16/23 TA activation + SLR x 10 bilateral  TA activation + hip flexion in hooklying with blue loop around thighs 2 x 10 bilateral  TA activation + hip adduction x 20 Seated ear to hip with yellow plyo ball x 10 each direction Standing chest press with 5# DB x 10  Pallof press with red TB x 15 each direction Iso shoulder extension with red TB + hip flexion x 10 each leg Farmer's carries holding 5# DB x 1 lap around clinic Pallof press with red TB x 15 each direction Iso shoulder extension with red TB + hip flexion x 10 each leg Hip flexion + unilateral 5# DB hold 2 x 10 bilateral     02/14/23 QL stretch on peanut ball 4 x 5 sec holds Supine LR x 10 5 sec holds Supine TFL stretch with green strap 3 x 20 sec Rt  Supine hip flexion stretch 2 x 20 sec bilateral  TA activation x 10 TA activation + bent knee fall out x 10 bilateral  Seated alt hand and knee press x 10 3 sec holds Education on log rolling technique  Seated ear to hip with yellow plyo ball x 10 each direction Pallof press with red TB x 15 each direction Iso shoulder extension with red TB + hip flexion x 10 each leg 3 way SB stretch x 8 each Manual : Adday to Rt QL for improved circulation and tissue mobility. PT present to monitor status      PATIENT EDUCATION:  Education details: POC; HEP  Person educated: Patient Education  method: Explanation, Demonstration, and Handouts Education comprehension: verbalized understanding, returned demonstration, and needs further education  HOME EXERCISE PROGRAM: Access Code: [email or web address] URL: [email or web address] Date: 02/16/2023 Prepared by: Penelope Bowie  Exercises - Supine Lower Trunk Rotation  - 1 x daily - 7 x weekly - 2 sets - 10 reps - Supine Single Knee to Chest  - 1 x daily - 7 x weekly - 2 sets - 20-30 hold - Supine Hamstring Stretch with Strap  - 1 x daily - 7 x weekly - 2 sets - 20-30 hold - Seated Quadratus Lumborum Stretch in Chair  - 1 x daily - 7 x weekly - 2 sets - 20-30 hold - Supine Transversus Abdominis Bracing - Hands on Stomach  - 1 x daily - 7 x weekly - 2 sets - 10 reps - Supine March  - 1 x daily - 7 x weekly - 1 sets - 10 reps - Bent Knee Fallouts  - 1 x daily - 7 x weekly - 2 sets - 10 reps - Sidelying Quadratus Lumborum Stretch on Table  - 1 x daily - 7 x weekly - 2 sets - 20-30 hold - Small Range Straight Leg Raise  - 1 x daily - 7 x weekly - 1 sets - 10 reps - Supine Hip Adduction Isometric with Ball  - 1 x daily - 7 x weekly - 2 sets - 10 reps - Supine March with Resistance Band  - 1 x daily - 7 x weekly - 2 sets - 10 reps  ASSESSMENT:  CLINICAL IMPRESSION: Yolanda Peters continues to report no back pain levels but some achyness is present. She is tolerating higher level core strengthening exercises. She has been compliant with HEP. She still required some reminders to utilize logroll technique when completing supine to sit transfer. Patient will benefit  from skilled PT to address the below impairments and improve overall function.       Eval: Patient is a 83 y.o. female who was seen today for physical therapy evaluation and treatment for right sided back pain. Yolanda Peters presents to therapy with increased right sided back pan after doing core exercises on her back two weeks ago. Since then her sleep has been disturbed and she has not been to  her aquatic exercises. Based on evaluation noted muscle weakness, poor posture, and increased lumbar muscle spasms. Plan to address musculoskeletal abnormalities and educate patient on proper form for core exercises. Patient will benefit from skilled PT to address the below impairments and improve overall function.   OBJECTIVE IMPAIRMENTS: Abnormal gait, decreased endurance, decreased ROM, decreased strength, hypomobility, increased muscle spasms, postural dysfunction, and pain.   ACTIVITY LIMITATIONS: sleeping, transfers, and bed mobility  PARTICIPATION LIMITATIONS: cleaning, shopping, and community activity  PERSONAL FACTORS: Age and 3+ comorbidities: HTN; scoliosis ; OA  are also affecting patient's functional outcome.   REHAB POTENTIAL: Good  CLINICAL DECISION MAKING: Stable/uncomplicated  EVALUATION COMPLEXITY: Low   GOALS: Goals reviewed with patient? Yes  SHORT TERM GOALS: Target date: 02/22/2023   Patient will be independent with initial HEP. Baseline:  Goal status: INITIAL  2.  Patient will report > or = to 40% improvement in back pain since starting PT. Baseline:  Goal status: INITIAL   3.  Patient will report > or = to 50% improvement in sleep quality since starting PT. Baseline: waking up 4-6x a night Goal status: INITIAL    LONG TERM GOALS: Target date: 03/08/2023   Patient will demonstrate independence in advanced HEP. Baseline:  Goal status: INITIAL  2.  Patient will report > or = to 60%  improvement in back pain since starting PT. Baseline:  Goal status: INITIAL  3.  Patient will be able to return to aquatic exercise classes. Baseline:  Goal status: INITIAL  4. Patient will verbalize and demonstrate self-care strategies to manage pain including tissue mobility practices and change of position Baseline:  Goal status: INITIAL  5.  Patient will ambulate > or = to 525 ft during  3 MWT Baseline: 464ft Goal status: INITIAL   PLAN:  PT  FREQUENCY: 1-2x/week  PT DURATION: other: 5 weeks  PLANNED INTERVENTIONS: 97164- PT Re-evaluation, 97110-Therapeutic exercises, 97530- Therapeutic activity, 97112- Neuromuscular re-education, 97535- Self Care, 16109- Manual therapy, [id number]- Gait training, [phone]- Canalith repositioning, [id number]- Aquatic Therapy, 97014- Electrical stimulation (unattended), [id number]- Electrical stimulation (manual), [id number]- Vasopneumatic device, [id number]- Ultrasound, [id number]- Traction (mechanical), [id number]- Ionotophoresis 4mg /ml Dexamethasone , Patient/Family education, Balance training, Stair training, Dry Needling, Joint mobilization, Joint manipulation, Spinal manipulation, Spinal mobilization, Vestibular training, Cryotherapy, and Moist heat.  PLAN FOR NEXT SESSION: DN ; continue core strengthening   Penelope Bowie, PT 02/20/23 12:03 PM [facility] [address] [place], [place] [address] Phone # [phone] Fax [phone]

## 2023-02-22 ENCOUNTER — Ambulatory Visit: Payer: Medicare Other | Admitting: Physical Therapy

## 2023-02-22 DIAGNOSIS — M5459 Other low back pain: Secondary | ICD-10-CM | POA: Diagnosis not present

## 2023-02-22 DIAGNOSIS — M6281 Muscle weakness (generalized): Secondary | ICD-10-CM

## 2023-02-22 NOTE — Therapy (Signed)
OUTPATIENT PHYSICAL THERAPY THORACOLUMBAR TREATMENT   Patient Name: Yolanda Peters MRN: [id number] DOB:12/17/1940, 83 y.o., female Today's Date: 02/22/2023  END OF SESSION:  PT End of Session - 02/22/23 1402     Visit Number 8    Date for PT Re-Evaluation 03/08/23    Authorization Type [employer] Medicare/BCBS    Authorization - Visit Number 8    Authorization - Number of Visits 10    Progress Note Due on Visit 10    PT Start Time 1402    PT Stop Time 1444    PT Time Calculation (min) 42 min    Activity Tolerance Patient tolerated treatment well                   Past Medical History:  Diagnosis Date   Allergy    Arthritis    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, skin cnacer   Cancer (HCC)    right breast    Hyperlipidemia    no meds taken   Hypertension    Personal history of radiation therapy 2011   rt breast   Past Surgical History:  Procedure Laterality Date   BREAST DUCTAL SYSTEM EXCISION     right breast   BREAST LUMPECTOMY     CATARACT EXTRACTION     bilateral   CESAREAN SECTION     COLONOSCOPY     ELBOW SURGERY     left   I & D EXTREMITY Right 08/06/2020   Procedure: IRRIGATION AND DEBRIDEMENT OF LEG, wound vac placement;  Surgeon: Nadara Mustard, MD;  Location: [facility] OR;  Service: Orthopedics;  Laterality: Right;   I & D EXTREMITY Right 08/11/2020   Procedure: IRRIGATION AND DEBRIDEMENT EXTREMITY SKIN GRAFT;  Surgeon: Nadara Mustard, MD;  Location: [facility] OR;  Service: Orthopedics;  Laterality: Right;   JOINT REPLACEMENT     KNEE ARTHROPLASTY Left    March 2021   MYOMECTOMY     REPLACEMENT TOTAL KNEE Left 03/2019   TOTAL KNEE ARTHROPLASTY Right 06/26/2018   Procedure: TOTAL KNEE ARTHROPLASTY;  Surgeon: Durene Romans, MD;  Location: [facility] ORS;  Service: Orthopedics;  Laterality: Right;  70 mins   Patient Active Problem List   Diagnosis Date Noted   Ulcer of right leg, with necrosis of muscle (HCC)    Cellulitis of right leg 08/05/2020   Hypokalemia  06/27/2018   Overweight (BMI 25.0-29.9) 06/27/2018   S/P right TKA 06/26/2018   Autoimmune disease (HCC) 03/15/2016   High risk medication use 03/15/2016   Pain in joint of right shoulder 03/15/2016   Bilateral hip pain 03/15/2016   Primary osteoarthritis of both knees 03/15/2016   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  I heard you do needles.    Eval: Patient presents with an increased muscle spasm after doing sit ups at home. She did 10 repetitions and on the last repetition she felt a pain in her back. She could not sleep on her left side the same night. Since then her sleep has been disturbed. She wakes up about 4-6 times a night because of the pain. She went to the orthopedic doctor and the prescribed muscle relaxer and that has helped. She hasn't been to aquatic classes since then because she is afraid of making it worse. Patient denies numbness and tingling down legs. Sleep is the most limiting functional activity.  PERTINENT HISTORY:  HTN; hx bilateral knee pain; Hx breast cancer; scoliosis ; OA  PAIN: 02/20/2023 Are you having pain? Yes: NPRS scale: 0/10 Pain location: Right side lower lumbar side Pain description: dull Aggravating factors: laying down at night Relieving  factors: muscle relaxer  PRECAUTIONS: None  RED FLAGS: None   WEIGHT BEARING RESTRICTIONS: No  FALLS:  Has patient fallen in last 6 months? 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  DIAGNOSTIC FINDINGS:  Imaging done at [facility] but unable to see those results  PATIENT SURVEYS:  Modified Oswestry 7/50 14%   COGNITION: Overall cognitive status: Within functional limits for tasks assessed     SENSATION: WFL  MUSCLE LENGTH: Hamstring length decreased bilateral   POSTURE: rounded shoulders and forward head  PALPATION: Increased muscle spasms Rt side QL  LUMBAR ROM: * pain  AROM eval  Flexion Bends to shins (knees bent)  Extension 80% limited  Right lateral flexion Bends to above knee joint line  Left lateral flexion Bend to above knee joint line  Right rotation 50% limited*  Left rotation 40% limited   (Blank rows = not tested)  LOWER EXTREMITY ROM:   WFL bilateral   LOWER EXTREMITY MMT:    MMT Right eval Left eval  Hip flexion 4- 4-  Hip extension    Hip abduction 4- 4-  Hip adduction 4- 4-  Hip internal rotation    Hip external rotation    Knee flexion 4- 4-  Knee extension 4 4  Ankle dorsiflexion    Ankle plantarflexion    Ankle inversion    Ankle eversion     (Blank rows = not tested)    FUNCTIONAL TESTS:  5 times sit to stand: 15.07 no UE support Timed up and go (TUG): 12.45 sec 3 minute walk test: 426  GAIT: Distance walked: 426 ft Comments: decreased cadence; decreased step length; poor swing through o Lt   TREATMENT DATE:  02/22/23-  Nu-step L2 5 min  Open books 7x Supine blue ball lumbar  flexion 7x Supine trunk rotation with ball 10x Seated blue ball roll outs 10x Seated right UE elevation and reach 7x At the stairs 2nd step hip flexor, gastroc and quadratus lumborum muscle lengthening 3 sets of 5 right/left: Rocking forward and back Rocking forward and back with arm elevation Rocking forward and back with arm reach up and over Lateral 6 inch  step ups with opposite hip abduction 7x right/left Standing holding 5# kettlebell ipsilateral hip flexion 10x right/left  Manual therapy: soft tissue mobilization to right QL Trigger Point Dry Needling Subsequent Treatment: Instructions provided previously at initial dry needling treatment.  Patient Verbal Consent Given: Yes Education Handout Provided: Previously Provided Muscles Treated: right QL on left sidelying over folded pillow Electrical Stimulation Performed: No Treatment Response/Outcome: improved soft tissue length   02/20/23- Patient was 15 mins late to appointment 3 way SB stretch x 8 each direction Seated ear to hip with yellow plyo ball x 10 each direction Standing chest press with yellow plyo ball DB x 10  Supine TA activation + SLR x 10 bilateral  TA activation + hip flexion  2 x 10 bilateral  Hooklying TA activation + hip adduction x 20 Farmer's carries holding 5# DB x 1 lap around clinic Pallof press with red TB x 15 each direction Iso shoulder extension with red TB + hip flexion x 10 each leg   02/16/23 TA activation + SLR x 10 bilateral  TA activation + hip flexion in hooklying with blue loop around thighs 2 x 10 bilateral  TA activation + hip adduction x 20 Seated ear to hip with yellow plyo ball x 10 each direction Standing chest press with 5# DB x 10  Pallof press with red TB x 15 each direction Iso shoulder extension with red TB + hip flexion x 10 each leg Farmer's carries holding 5# DB x 1 lap around clinic Pallof press with red TB x 15 each direction Iso shoulder extension with red TB + hip flexion  x 10 each leg Hip flexion + unilateral 5# DB hold 2 x 10 bilateral     02/14/23 QL stretch on peanut ball 4 x 5 sec holds Supine LR x 10 5 sec holds Supine TFL stretch with green strap 3 x 20 sec Rt  Supine hip flexion stretch 2 x 20 sec bilateral  TA activation x 10 TA activation + bent knee fall out x 10 bilateral  Seated alt hand and knee press x 10 3 sec holds Education on log rolling technique  Seated ear to hip with yellow plyo ball x 10 each direction Pallof press with red TB x 15 each direction Iso shoulder extension with red TB + hip flexion x 10 each leg 3 way SB stretch x 8 each Manual : Adday to Rt QL for improved circulation and tissue mobility. PT present to monitor status      PATIENT EDUCATION:  Education details: POC; HEP  Person educated: Patient Education method: Explanation, Demonstration, and Handouts Education comprehension: verbalized understanding, returned demonstration, and needs further education  HOME EXERCISE PROGRAM: Access Code: [email or web address] URL: [email or web address] Date: 02/16/2023 Prepared by: Claude Manges  Exercises - Supine Lower Trunk Rotation  - 1 x daily - 7 x weekly - 2 sets - 10 reps - Supine Single Knee to Chest  - 1 x daily - 7 x weekly - 2 sets - 20-30 hold - Supine Hamstring Stretch with Strap  - 1 x daily - 7 x weekly - 2 sets - 20-30 hold - Seated Quadratus Lumborum Stretch in Chair  - 1 x daily - 7 x weekly - 2 sets - 20-30 hold - Supine Transversus Abdominis Bracing - Hands on Stomach  - 1 x daily - 7 x weekly - 2 sets - 10 reps - Supine March  - 1 x daily - 7 x weekly - 1 sets -  10 reps - Bent Knee Fallouts  - 1 x daily - 7 x weekly - 2 sets - 10 reps - Sidelying Quadratus Lumborum Stretch on Table  - 1 x daily - 7 x weekly - 2 sets - 20-30 hold - Small Range Straight Leg Raise  - 1 x daily - 7 x weekly - 1 sets - 10 reps - Supine Hip Adduction Isometric with Ball  - 1 x daily - 7 x weekly - 2 sets - 10 reps -  Supine March with Resistance Band  - 1 x daily - 7 x weekly - 2 sets - 10 reps  ASSESSMENT:  CLINICAL IMPRESSION: Reports recurring pain in right trunk musculature.  Tender right QL upon palpation.  She is receptive to DN and although painful she has much improved soft tissue mobility and decreased tender point size and number following treatment. The patient was encouraged in regular performance of HEP post DN including soft tissue lengthening and strengthening exercises to enhance long term benefit.       Eval: Patient is a 83 y.o. female who was seen today for physical therapy evaluation and treatment for right sided back pain. Andrey Campanile presents to therapy with increased right sided back pan after doing core exercises on her back two weeks ago. Since then her sleep has been disturbed and she has not been to her aquatic exercises. Based on evaluation noted muscle weakness, poor posture, and increased lumbar muscle spasms. Plan to address musculoskeletal abnormalities and educate patient on proper form for core exercises. Patient will benefit from skilled PT to address the below impairments and improve overall function.   OBJECTIVE IMPAIRMENTS: Abnormal gait, decreased endurance, decreased ROM, decreased strength, hypomobility, increased muscle spasms, postural dysfunction, and pain.   ACTIVITY LIMITATIONS: sleeping, transfers, and bed mobility  PARTICIPATION LIMITATIONS: cleaning, shopping, and community activity  PERSONAL FACTORS: Age and 3+ comorbidities: HTN; scoliosis ; OA  are also affecting patient's functional outcome.   REHAB POTENTIAL: Good  CLINICAL DECISION MAKING: Stable/uncomplicated  EVALUATION COMPLEXITY: Low   GOALS: Goals reviewed with patient? Yes  SHORT TERM GOALS: Target date: 02/22/2023   Patient will be independent with initial HEP. Baseline:  Goal status: INITIAL  2.  Patient will report > or = to 40% improvement in back pain since starting PT. Baseline:   Goal status: INITIAL   3.  Patient will report > or = to 50% improvement in sleep quality since starting PT. Baseline: waking up 4-6x a night Goal status: INITIAL    LONG TERM GOALS: Target date: 03/08/2023   Patient will demonstrate independence in advanced HEP. Baseline:  Goal status: INITIAL  2.  Patient will report > or = to 60%  improvement in back pain since starting PT. Baseline:  Goal status: INITIAL  3.  Patient will be able to return to aquatic exercise classes. Baseline:  Goal status: INITIAL  4. Patient will verbalize and demonstrate self-care strategies to manage pain including tissue mobility practices and change of position Baseline:  Goal status: INITIAL  5.  Patient will ambulate > or = to 525 ft during  3 MWT Baseline: 439ft Goal status: INITIAL   PLAN:  PT FREQUENCY: 1-2x/week  PT DURATION: other: 5 weeks  PLANNED INTERVENTIONS: 97164- PT Re-evaluation, 97110-Therapeutic exercises, 97530- Therapeutic activity, 97112- Neuromuscular re-education, 97535- Self Care, 16109- Manual therapy, [id number]- Gait training, [phone]- Canalith repositioning, [id number]- Aquatic Therapy, 97014- Electrical stimulation (unattended), [id number]- Electrical stimulation (manual), [id number]- Vasopneumatic device, [id number]- Ultrasound, [id number]- Traction (mechanical), [id number]-  Ionotophoresis 4mg /ml Dexamethasone, Patient/Family education, Balance training, Stair training, Dry Needling, Joint mobilization, Joint manipulation, Spinal manipulation, Spinal mobilization, Vestibular training, Cryotherapy, and Moist heat.  PLAN FOR NEXT SESSION: DN ; continue core strengthening    Lavinia Sharps, PT 02/22/23 4:32 PM Phone: [phone] Fax: [phone]  [facility] [address], [address] [place], [place] [address] Phone # [phone] Fax [phone]

## 2023-02-27 ENCOUNTER — Encounter: Payer: Self-pay | Admitting: Physical Therapy

## 2023-02-27 ENCOUNTER — Ambulatory Visit: Payer: Medicare Other | Admitting: Physical Therapy

## 2023-02-27 DIAGNOSIS — R293 Abnormal posture: Secondary | ICD-10-CM

## 2023-02-27 DIAGNOSIS — M6281 Muscle weakness (generalized): Secondary | ICD-10-CM

## 2023-02-27 DIAGNOSIS — M5459 Other low back pain: Secondary | ICD-10-CM | POA: Diagnosis not present

## 2023-02-27 DIAGNOSIS — R252 Cramp and spasm: Secondary | ICD-10-CM

## 2023-02-27 NOTE — Therapy (Signed)
OUTPATIENT PHYSICAL THERAPY THORACOLUMBAR TREATMENT / DISCHARGE NOTE   Patient Name: Yolanda Peters MRN: [id number] DOB:22-Apr-1940, 83 y.o., female Today's Date: 02/27/2023  END OF SESSION:  PT End of Session - 02/27/23 1244     Visit Number 9    Date for PT Re-Evaluation 03/08/23    Authorization Type [employer] Medicare/BCBS    Authorization Time Period 02/01/2023-05/17/2023    Authorization - Visit Number 9    Authorization - Number of Visits 10    Progress Note Due on Visit 10    PT Start Time 1149    PT Stop Time 1229    PT Time Calculation (min) 40 min    Activity Tolerance Patient tolerated treatment well    Behavior During Therapy [facility] for tasks assessed/performed                    Past Medical History:  Diagnosis Date   Allergy    Arthritis    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, skin cnacer   Cancer (HCC)    right breast    Hyperlipidemia    no meds taken   Hypertension    Personal history of radiation therapy 2011   rt breast   Past Surgical History:  Procedure Laterality Date   BREAST DUCTAL SYSTEM EXCISION     right breast   BREAST LUMPECTOMY     CATARACT EXTRACTION     bilateral   CESAREAN SECTION     COLONOSCOPY     ELBOW SURGERY     left   I & D EXTREMITY Right 08/06/2020   Procedure: IRRIGATION AND DEBRIDEMENT OF LEG, wound vac placement;  Surgeon: Nadara Mustard, MD;  Location: [facility] OR;  Service: Orthopedics;  Laterality: Right;   I & D EXTREMITY Right 08/11/2020   Procedure: IRRIGATION AND DEBRIDEMENT EXTREMITY SKIN GRAFT;  Surgeon: Nadara Mustard, MD;  Location: [facility] OR;  Service: Orthopedics;  Laterality: Right;   JOINT REPLACEMENT     KNEE ARTHROPLASTY Left    March 2021   MYOMECTOMY     REPLACEMENT TOTAL KNEE Left 03/2019   TOTAL KNEE ARTHROPLASTY Right 06/26/2018   Procedure: TOTAL KNEE ARTHROPLASTY;  Surgeon: Durene Romans, MD;  Location: [facility] ORS;  Service: Orthopedics;  Laterality: Right;  70 mins   Patient Active Problem List    Diagnosis Date Noted   Ulcer of right leg, with necrosis of muscle (HCC)    Cellulitis of right leg 08/05/2020   Hypokalemia 06/27/2018   Overweight (BMI 25.0-29.9) 06/27/2018   S/P right TKA 06/26/2018   Autoimmune disease (HCC) 03/15/2016   High risk medication use 03/15/2016   Pain in joint of right shoulder 03/15/2016   Bilateral hip pain 03/15/2016   Primary osteoarthritis of both knees 03/15/2016   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 She responded well to needling.  Eval: Patient presents with an increased muscle spasm after doing sit ups at home. She did 10 repetitions and on the last repetition she felt a pain in her back. She could not sleep on her left side the same night. Since then her sleep has been disturbed. She wakes up about 4-6 times a night because of the pain. She went to the orthopedic doctor and the prescribed muscle relaxer and that has helped. She hasn't been to aquatic classes since then because she is afraid of making it worse. Patient denies numbness and tingling down legs. Sleep is the most limiting functional activity.  PERTINENT HISTORY:  HTN; hx bilateral knee pain; Hx breast cancer; scoliosis ; OA  PAIN: 02/20/2023 Are you having pain? Yes: NPRS scale: 0/10 Pain  location: Right side lower lumbar side Pain description: dull Aggravating factors: laying down at night Relieving factors: muscle relaxer  PRECAUTIONS: None  RED FLAGS: None   WEIGHT BEARING RESTRICTIONS: No  FALLS:  Has patient fallen in last 6 months? 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  DIAGNOSTIC FINDINGS:  Imaging done at [facility] but unable to see those results  PATIENT SURVEYS:  Modified Oswestry 7/50 14%   COGNITION: Overall cognitive status: Within functional limits for tasks assessed     SENSATION: WFL  MUSCLE LENGTH: Hamstring length decreased bilateral   POSTURE: rounded shoulders and forward head  PALPATION: Increased muscle spasms Rt side QL  LUMBAR ROM: * pain  AROM eval  Flexion Bends to shins (knees bent)  Extension 80% limited  Right lateral flexion Bends to above knee joint line  Left lateral flexion Bend to above knee joint line  Right rotation 50% limited*  Left rotation 40% limited   (Blank rows = not tested)  LOWER EXTREMITY ROM:   WFL bilateral   LOWER EXTREMITY MMT:    MMT Right eval Left eval  Hip flexion 4- 4-  Hip extension    Hip abduction 4- 4-  Hip adduction 4- 4-  Hip internal rotation    Hip external rotation    Knee flexion 4- 4-  Knee extension 4 4  Ankle dorsiflexion    Ankle plantarflexion    Ankle inversion    Ankle eversion     (Blank rows = not tested)    FUNCTIONAL TESTS:  5 times sit to stand: 15.07 no UE support Timed up and go (TUG): 12.45 sec 3 minute walk test: 426  GAIT: Distance walked: 426 ft Comments: decreased cadence; decreased step length;  poor swing through o Lt   TREATMENT DATE:  02/27/23-  Goal Assessment : 394FT Discussion of managing symptoms if pain flare up Iso shoulder extension with red TB + hip flexion x 30 Pallof press with red TB x 15 each direction Seated blue ball roll outs x 8 each direction Open books x8 each direction Alt hand & knee press with blue ball x 10 each side Hooklying ball press + TA activation x 20 Standing holding 5# kettlebell ipsilateral hip flexion x16 right/left  At the stairs 2nd step hip flexor, gastroc and quadratus lumborum muscle lengthening 1  sets of 5 right/left:  Rocking forward & back  Rocking forward & back + reach over Farmer's carries holding 5# DB x 2 lap around clinic Review of updated HEP    02/22/23-  Nu-step L2 5 min  Open books 7x Supine blue ball lumbar flexion 7x Supine trunk rotation with ball 10x Seated blue ball roll outs 10x Seated right UE elevation and reach 7x At the stairs 2nd step hip flexor, gastroc and quadratus lumborum muscle lengthening 3 sets of 5 right/left: Rocking forward and back Rocking forward and back with arm elevation Rocking forward and back with arm reach up and over Lateral 6 inch step ups with opposite hip abduction 7x right/left Standing holding 5# kettlebell ipsilateral hip flexion 10x right/left  Manual therapy: soft tissue mobilization to right QL Trigger Point Dry Needling Subsequent Treatment: Instructions provided previously at initial dry needling treatment.  Patient Verbal Consent Given: Yes Education Handout Provided: Previously Provided Muscles Treated: right QL on left sidelying over folded pillow Electrical Stimulation Performed: No Treatment Response/Outcome: improved soft tissue length   02/20/23- Patient was 15 mins late to appointment 3 way SB stretch x 8 each direction Seated ear to hip with yellow plyo ball x 10 each direction Standing chest press with yellow plyo ball DB x 10  Supine TA activation + SLR  x 10 bilateral  TA activation + hip flexion  2 x 10 bilateral  Hooklying TA activation + hip adduction x 20 Farmer's carries holding 5# DB x 1 lap around clinic Pallof press with red TB x 15 each direction Iso shoulder extension with red TB + hip flexion x 10 each leg           PATIENT EDUCATION:  Education details: POC; HEP  Person educated: Patient Education method: Programmer, multimedia, Demonstration, and Handouts Education comprehension: verbalized understanding, returned demonstration, and needs further education  HOME EXERCISE PROGRAM: Access Code: [email or web address] URL: [email or web address] Date: 02/27/2023 Prepared by: Claude Manges  Exercises - Supine Lower Trunk Rotation  - 1 x daily - 7 x weekly - 2 sets - 10 reps - Supine Single Knee to Chest  - 1 x daily - 7 x weekly - 2 sets - 20-30 hold - Supine Hamstring Stretch with Strap  - 1 x daily - 7 x weekly - 2 sets - 20-30 hold - Seated Quadratus Lumborum Stretch in Chair  - 1 x daily - 7 x weekly - 2 sets - 20-30 hold - Supine Transversus Abdominis Bracing - Hands on Stomach  - 1 x daily - 7 x weekly - 2 sets - 10 reps - Supine March  - 1 x daily - 7 x weekly - 1 sets - 10 reps - Bent Knee Fallouts  - 1 x daily - 7 x weekly - 2 sets - 10 reps - Sidelying Quadratus Lumborum Stretch on Table  - 1 x daily - 7 x weekly - 2 sets - 20-30 hold - Small Range Straight Leg Raise  - 1 x daily - 7 x weekly - 1 sets - 10 reps - Supine Hip Adduction Isometric with Ball  - 1 x daily - 7 x weekly - 2 sets - 10 reps - Supine March with Resistance Band  - 1 x daily - 7 x weekly - 2 sets - 10 reps - Standing Anti-Rotation Press with Anchored Resistance  - 1 x daily - 7 x weekly - 1 sets - 15 reps - Shoulder extension with resistance - Neutral  -  1 x daily - 7 x weekly - 2 sets - 10 reps - Seated Abdominal Press into [employer]  - 1 x daily - 7 x weekly - 2 sets - 10 reps - 3 hold - Supine Alternating Knee Taps with Hands  - 1 x daily - 7  x weekly - 1 sets - 10 reps  ASSESSMENT:  CLINICAL IMPRESSION: Patient verbalized feeling 90% better since starting physical therapy. She is now able to lay on either side ay night without increased pain. She has been compliant with HEP and she began her aquatic program again. Overall, she has made good progress with physical therapy and met majority of her goals at this time. Educated patient on symptom management and pain relief. Patient to discharge home with HEP   Eval: Patient is a 83 y.o. female who was seen today for physical therapy evaluation and treatment for right sided back pain. Yolanda Peters presents to therapy with increased right sided back pan after doing core exercises on her back two weeks ago. Since then her sleep has been disturbed and she has not been to her aquatic exercises. Based on evaluation noted muscle weakness, poor posture, and increased lumbar muscle spasms. Plan to address musculoskeletal abnormalities and educate patient on proper form for core exercises. Patient will benefit from skilled PT to address the below impairments and improve overall function.   OBJECTIVE IMPAIRMENTS: Abnormal gait, decreased endurance, decreased ROM, decreased strength, hypomobility, increased muscle spasms, postural dysfunction, and pain.   ACTIVITY LIMITATIONS: sleeping, transfers, and bed mobility  PARTICIPATION LIMITATIONS: cleaning, shopping, and community activity  PERSONAL FACTORS: Age and 3+ comorbidities: HTN; scoliosis ; OA  are also affecting patient's functional outcome.   REHAB POTENTIAL: Good  CLINICAL DECISION MAKING: Stable/uncomplicated  EVALUATION COMPLEXITY: Low   GOALS: Goals reviewed with patient? Yes  SHORT TERM GOALS: Target date: 02/22/2023   Patient will be independent with initial HEP. Baseline:  Goal status: MET 02/27/2023  2.  Patient will report > or = to 40% improvement in back pain since starting PT. Baseline:  Goal status: MET 02/27/2023   3.   Patient will report > or = to 50% improvement in sleep quality since starting PT. Baseline: waking up 4-6x a night Goal status: MET 02/27/2023    LONG TERM GOALS: Target date: 03/08/2023   Patient will demonstrate independence in advanced HEP. Baseline:  Goal status: MET 02/27/2023  2.  Patient will report > or = to 60%  improvement in back pain since starting PT. Baseline:  Goal status: MET 02/27/2023  3.  Patient will be able to return to aquatic exercise classes. Baseline:  Goal status: MET 02/27/2023  4. Patient will verbalize and demonstrate self-care strategies to manage pain including tissue mobility practices and change of position Baseline:  Goal status: MET 02/27/2023  5.  Patient will ambulate > or = to 525 ft during  3 MWT Baseline: 451ft Goal status: NOT MET 02/27/2023   PLAN:  PT FREQUENCY: 1-2x/week  PT DURATION: other: 5 weeks  PLANNED INTERVENTIONS: 97164- PT Re-evaluation, 97110-Therapeutic exercises, 97530- Therapeutic activity, 97112- Neuromuscular re-education, 97535- Self Care, 19147- Manual therapy, [id number]- Gait training, [id number]- Canalith repositioning, [id number]- Aquatic Therapy, 97014- Electrical stimulation (unattended), [id number]- Electrical stimulation (manual), [id number]- Vasopneumatic device, [id number]- Ultrasound, [id number]- Traction (mechanical), [id number]- Ionotophoresis 4mg /ml Dexamethasone, Patient/Family education, Balance training, Stair training, Dry Needling, Joint mobilization, Joint manipulation, Spinal manipulation, Spinal mobilization, Vestibular training, Cryotherapy, and Moist heat.  PLAN FOR NEXT SESSION: patient to discharge home with  HEP    PHYSICAL THERAPY DISCHARGE SUMMARY  Visits from Start of Care: 9  Current functional level related to goals / functional outcomes: See above   Remaining deficits: None   Education / Equipment: See above   Patient agrees to discharge. Patient goals were partially met. Patient is being discharged due to being  pleased with the current functional level.  Claude Manges, PT 02/27/23 12:48 PM [facility] [address] [place], [place] [address] Phone # [phone] Fax [phone]

## 2023-03-01 ENCOUNTER — Encounter: Payer: Medicare Other | Admitting: Physical Therapy

## 2023-05-15 ENCOUNTER — Other Ambulatory Visit: Payer: Self-pay

## 2023-05-15 ENCOUNTER — Emergency Department (HOSPITAL_COMMUNITY)
Admission: EM | Admit: 2023-05-15 | Discharge: 2023-05-15 | Disposition: A | Discharge status: Home / Self Care | Attending: Emergency Medicine | Admitting: Emergency Medicine

## 2023-05-15 ENCOUNTER — Emergency Department (HOSPITAL_COMMUNITY)

## 2023-05-15 DIAGNOSIS — R110 Nausea: Secondary | ICD-10-CM

## 2023-05-15 DIAGNOSIS — R0602 Shortness of breath: Secondary | ICD-10-CM | POA: Insufficient documentation

## 2023-05-15 DIAGNOSIS — E860 Dehydration: Secondary | ICD-10-CM | POA: Insufficient documentation

## 2023-05-15 DIAGNOSIS — R55 Syncope and collapse: Secondary | ICD-10-CM | POA: Diagnosis present

## 2023-05-15 DIAGNOSIS — R112 Nausea with vomiting, unspecified: Secondary | ICD-10-CM | POA: Diagnosis not present

## 2023-05-15 DIAGNOSIS — I10 Essential (primary) hypertension: Secondary | ICD-10-CM | POA: Insufficient documentation

## 2023-05-15 LAB — URINALYSIS, W/ REFLEX TO CULTURE (INFECTION SUSPECTED)
Bilirubin Urine: NEGATIVE
Glucose, UA: NEGATIVE mg/dL
Hgb urine dipstick: NEGATIVE
Ketones, ur: 20 mg/dL — AB
Leukocytes,Ua: NEGATIVE
Nitrite: NEGATIVE
Protein, ur: NEGATIVE mg/dL
Specific Gravity, Urine: 1.011 (ref 1.005–1.030)
pH: 6 (ref 5.0–8.0)

## 2023-05-15 LAB — CBC WITH DIFFERENTIAL/PLATELET
Abs Immature Granulocytes: 0.05 10*3/uL (ref 0.00–0.07)
Basophils Absolute: 0 10*3/uL (ref 0.0–0.1)
Basophils Relative: 0 %
Eosinophils Absolute: 0.1 10*3/uL (ref 0.0–0.5)
Eosinophils Relative: 1 %
HCT: 49 % — ABNORMAL HIGH (ref 36.0–46.0)
Hemoglobin: 15.2 g/dL — ABNORMAL HIGH (ref 12.0–15.0)
Immature Granulocytes: 1 %
Lymphocytes Relative: 11 %
Lymphs Abs: 1.1 10*3/uL (ref 0.7–4.0)
MCH: 27.9 pg (ref 26.0–34.0)
MCHC: 31 g/dL (ref 30.0–36.0)
MCV: 89.9 fL (ref 80.0–100.0)
Monocytes Absolute: 0.5 10*3/uL (ref 0.1–1.0)
Monocytes Relative: 5 %
Neutro Abs: 8.3 10*3/uL — ABNORMAL HIGH (ref 1.7–7.7)
Neutrophils Relative %: 82 %
Platelets: 235 10*3/uL (ref 150–400)
RBC: 5.45 MIL/uL — ABNORMAL HIGH (ref 3.87–5.11)
RDW: 14.6 % (ref 11.5–15.5)
WBC: 10 10*3/uL (ref 4.0–10.5)
nRBC: 0 % (ref 0.0–0.2)

## 2023-05-15 LAB — LIPASE, BLOOD: Lipase: 34 U/L (ref 11–51)

## 2023-05-15 LAB — MAGNESIUM: Magnesium: 2.1 mg/dL (ref 1.7–2.4)

## 2023-05-15 LAB — BRAIN NATRIURETIC PEPTIDE: B Natriuretic Peptide: 43.4 pg/mL (ref 0.0–100.0)

## 2023-05-15 LAB — COMPREHENSIVE METABOLIC PANEL WITH GFR
ALT: 24 U/L (ref 0–44)
AST: 22 U/L (ref 15–41)
Albumin: 3.9 g/dL (ref 3.5–5.0)
Alkaline Phosphatase: 68 U/L (ref 38–126)
Anion gap: 14 (ref 5–15)
BUN: 23 mg/dL (ref 8–23)
CO2: 19 mmol/L — ABNORMAL LOW (ref 22–32)
Calcium: 8.5 mg/dL — ABNORMAL LOW (ref 8.9–10.3)
Chloride: 103 mmol/L (ref 98–111)
Creatinine, Ser: 0.73 mg/dL (ref 0.44–1.00)
GFR, Estimated: >60 mL/min (ref >60)
Glucose, Bld: 133 mg/dL — ABNORMAL HIGH (ref 70–99)
Potassium: 3 mmol/L — ABNORMAL LOW (ref 3.5–5.1)
Sodium: 136 mmol/L (ref 135–145)
Total Bilirubin: 0.9 mg/dL (ref 0.0–1.2)
Total Protein: 6.8 g/dL (ref 6.5–8.1)

## 2023-05-15 LAB — TROPONIN I (HIGH SENSITIVITY)
Troponin I (High Sensitivity): 4 ng/L (ref <18)
Troponin I (High Sensitivity): 6 ng/L (ref <18)

## 2023-05-15 LAB — LACTIC ACID, PLASMA: Lactic Acid, Venous: 1.7 mmol/L (ref 0.5–1.9)

## 2023-05-15 LAB — TSH: TSH: 1.133 u[IU]/mL (ref 0.350–4.500)

## 2023-05-15 MED ORDER — POTASSIUM CHLORIDE CRYS ER 20 MEQ PO TBCR
40.0000 meq | EXTENDED_RELEASE_TABLET | Freq: Once | ORAL | Status: AC
  Administered 2023-05-15: 40 meq via ORAL
  Filled 2023-05-15: qty 2

## 2023-05-15 MED ORDER — SODIUM CHLORIDE 0.9 % IV BOLUS
1000.0000 mL | Freq: Once | INTRAVENOUS | Status: AC
  Administered 2023-05-15: 1000 mL via INTRAVENOUS

## 2023-05-15 NOTE — ED Notes (Signed)
 Pt coming in from home after an episode of near syncope and vomiting. Pt reports mild nausea now but it's getting better after EMS gave her zofran . Denies pain. Denies dizziness. Denies blurry vision or any other neuro symptom. Daughter at bedside. Reports felt like herself until the near syncope episode.   Placed on the monitor. Call bell within reach. No further needs right now.

## 2023-05-15 NOTE — ED Notes (Signed)
 Pt given some crackers to eat. Dressed and ready to go.

## 2023-05-15 NOTE — Discharge Instructions (Addendum)
 Your history, exam, workup today seem consistent with dehydration leading to a near syncopal episode.  Your workup today did not show other concerning findings aside from the slightly low potassium which we repleted.  Please rest and stay hydrated and follow-up with your primary doctor in the next few days.   If any symptoms change or worsen acutely, please return to the nearest emergency department.

## 2023-05-15 NOTE — ED Triage Notes (Signed)
 Pt arrived via [facility] from home for dizziness, near syncope, vomiting. Pt remains diaphoretic, nauseous at time of arrival. 20 LH, 4 mg zofran .    BP 190/100 HR 100 SPO2 89%-->98% on 2L [place]

## 2023-05-15 NOTE — ED Notes (Signed)
 Pt reports feeling much better than on arrival.

## 2023-05-15 NOTE — ED Notes (Signed)
 Pt ambulatory to the bathroom without any issues. Denies dizziness or feeling like she is going to pass out. Re connected to the monitor.

## 2023-05-15 NOTE — ED Provider Notes (Signed)
 [facility] Provider Note   CSN: [id number] Arrival date & time: 05/15/23  1600     History {Add pertinent medical, surgical, social history, OB history to HPI:1} Chief Complaint  Patient presents with   Dizziness   Near Syncope   Nausea    Yolanda Peters is a 83 y.o. female.  The history is provided by the patient and medical records. No language interpreter was used.  Near Syncope This is a new problem. The current episode started 1 to 2 hours ago. The problem occurs constantly. The problem has been gradually improving. Associated symptoms include shortness of breath (resolved). Pertinent negatives include no chest pain, no abdominal pain and no headaches. The symptoms are aggravated by standing. The symptoms are relieved by lying down. She has tried nothing for the symptoms. The treatment provided no relief.       Home Medications Prior to Admission medications   Medication Sig Start Date End Date Taking? Authorizing Provider  Ascorbic Acid  (VITAMIN C) 1000 MG tablet Take 1,000 mg by mouth daily.    [provider]  beta carotene  10000 UNIT capsule Take 25,000 Units by mouth daily. Patient not taking: Reported on 10/12/2022    [provider]  CALCIUM & MAGNESIUM  CARBONATES PO Take 2 tablets by mouth daily.    [provider]  Cholecalciferol  (VITAMIN D -3 PO) Take 2,000 Units by mouth daily.    [provider]  COVID-19 mRNA bivalent vaccine, Pfizer, (PFIZER COVID-19 VAC BIVALENT) injection Inject into the muscle. Patient not taking: Reported on 02/01/2023 10/07/20   Liane Redman, MD  Ginger, Zingiber officinalis, (GINGER ROOT PO) Take 200 mg by mouth 3 (three) times a week. Patient not taking: Reported on 10/12/2022    [provider]  HYDROcodone -acetaminophen  (NORCO/VICODIN) 5-325 MG tablet Take 1-2 tablets by mouth every 4 (four) hours as needed for moderate pain (pain score  4-6). Patient not taking: Reported on 02/01/2023 08/11/20   Macdonald Savoy, MD  LECITHIN PO Take 400 mg by mouth daily.    [provider]  loratadine  (CLARITIN ) 10 MG tablet Take 10 mg by mouth daily.    [provider]  Multiple Vitamin (MULTIVITAMIN PO) Take by mouth daily.    [provider]  Multiple Vitamins-Minerals (ZINC PO) Take 50 mg by mouth daily. Patient not taking: Reported on 10/12/2022    [provider]  neomycin-bacitracin-polymyxin (NEOSPORIN) ointment Apply 1 application  topically as needed for wound care. Patient not taking: Reported on 10/12/2022    [provider]  Probiotic Product (PROBIOTIC DAILY PO) Take 1 capsule by mouth daily.     [provider]  trimethoprim  (TRIMPEX ) 100 MG tablet Take 100 mg by mouth daily. 06/01/20   [provider]  verapamil  (VERELAN  PM) 360 MG 24 hr capsule Take 360 mg by mouth daily.  12/27/13   [provider]  vitamin E  180 MG (400 UNITS) capsule Take 400 Units by mouth daily.    [provider]      Allergies    Penicillins and Tetracyclines & related    Review of Systems   Review of Systems  Constitutional:  Positive for fatigue. Negative for chills and fever.  HENT:  Negative for congestion.   Eyes:  Negative for visual disturbance.  Respiratory:  Positive for shortness of breath (resolved). Negative for cough, chest tightness and wheezing.   Cardiovascular:  Negative for chest pain, palpitations and leg swelling.  Gastrointestinal:  Positive for nausea and vomiting. Negative for abdominal pain, constipation and diarrhea.  Genitourinary:  Negative for dysuria and flank pain.  Musculoskeletal:  Negative for back pain, neck pain and neck stiffness.  Skin:  Negative for rash and wound.  Neurological:  Positive for light-headedness. Negative for dizziness, syncope, speech difficulty, weakness and headaches.  Psychiatric/Behavioral:  Negative for  agitation and confusion.   All other systems reviewed and are negative.   Physical Exam Updated Vital Signs BP (!) 155/87 (BP Location: Left Arm)   Pulse 84   Temp (!) 96.5 F (35.8 C) (Temporal)   Resp 18   SpO2 93%  Physical Exam Vitals and nursing note reviewed.  Constitutional:      General: She is not in acute distress.    Appearance: She is well-developed. She is not ill-appearing, toxic-appearing or diaphoretic.  HENT:     Head: Normocephalic and atraumatic.     Nose: No congestion or rhinorrhea.     Mouth/Throat:     Mouth: Mucous membranes are dry.     Pharynx: No oropharyngeal exudate or posterior oropharyngeal erythema.  Eyes:     Extraocular Movements: Extraocular movements intact.     Conjunctiva/sclera: Conjunctivae normal.     Pupils: Pupils are equal, round, and reactive to light.  Neck:     Vascular: No carotid bruit.  Cardiovascular:     Rate and Rhythm: Normal rate and regular rhythm.     Heart sounds: No murmur heard. Pulmonary:     Effort: Pulmonary effort is normal. No respiratory distress.     Breath sounds: Normal breath sounds. No wheezing, rhonchi or rales.  Chest:     Chest wall: No tenderness.  Abdominal:     General: Abdomen is flat.     Palpations: Abdomen is soft.     Tenderness: There is no abdominal tenderness. There is no right CVA tenderness, left CVA tenderness, guarding or rebound.  Musculoskeletal:        General: No swelling or tenderness.     Cervical back: Neck supple. No tenderness.     Right lower leg: No edema.     Left lower leg: No edema.  Skin:    General: Skin is warm and dry.     Capillary Refill: Capillary refill takes less than 2 seconds.     Findings: No erythema or rash.  Neurological:     General: No focal deficit present.     Mental Status: She is alert.     Sensory: No sensory deficit.     Motor: No weakness.  Psychiatric:        Mood and Affect: Mood normal.     ED Results / Procedures / Treatments    Labs (all labs ordered are listed, but only abnormal results are displayed) Labs Reviewed - No data to display  EKG None  Radiology No results found.  Procedures Procedures  {Document cardiac monitor, telemetry assessment procedure when appropriate:1}  Medications Ordered in ED Medications - No data to display  ED Course/ Medical Decision Making/ A&P   {   Click here for ABCD2, HEART and other calculatorsREFRESH Note before signing :1}                              Medical Decision Making Amount and/or Complexity of Data Reviewed Labs: ordered. Radiology: ordered.    CASSEE THORNES is a 83 y.o. female with a past  medical history significant for hypertension, hyperlipidemia, and some previous autoimmune disease who presents for nausea, vomiting, diaphoresis, intermittent shortness breath, and near syncope.  According to patient, she was feeling well today and was playing cards with friends when she suddenly felt lightheaded.  She reported lightheadedness in her syncope and nearly passed out.  She was reportedly nauseous, had vomiting, and was diaphoretic.  She was denying any chest pain palpitations or acute shortness of breath however EMS reported that her oxygen saturations went to 89% on room air and was placed on 2 L during transport.  She has since been on room air here and has been in the low 90s on arrival.  She reportedly did sound winded on the phone last night but this is after she exerted herself and walked into the room but is not feeling short of breath this morning.  Otherwise she denies fevers, chills, congestion, cough, constipation, diarrhea, or urinary changes.  Denies leg pain or leg swelling.  He she think she has been fairly well-hydrated.  No recent medication changes per patient.  On exam, lungs were clear.  Chest nontender.  No murmur.  Abdomen nontender.  Neck and back nontender.  Patient did feel lightheaded when she sat up.  Intact sensation, strength, and  pulses in extremities.  Symmetric smile.  Clear speech.  Pupil symmetric and reactive with normal extract movements.  Legs had very minimal edema.  She did not have a carotid bruit on my exam.  She did not hit her head.  No headache or other neurologic complaints at this time.  EKG did not show STEMI.  Clinically I suspect a near syncopal episode that may be related to some dehydration.  She did have dry mucous membranes and we will give some fluids.  We did discuss that given her age and this near syncopal episode, we will get workup to look for a cardiac or pulmonary cause with troponin and x-ray.  Will get labs.  Will give her some fluids.  Will look for electrolyte disturbance and will look for occult infection with urine as well as a chest x-ray.  She is feeling better after fluids and workup is reassuring, patient may be safe for discharge home.  Anticipate ambulation with pulse oximetry to look for lightheadedness, near syncope, or recurrent hypoxia.  Anticipate reassessment after workup to determine disposition.        {Document critical care time when appropriate:1} {Document review of labs and clinical decision tools ie heart score, Chads2Vasc2 etc:1}  {Document your independent review of radiology images, and any outside records:1} {Document your discussion with family members, caretakers, and with consultants:1} {Document social determinants of health affecting pt's care:1} {Document your decision making why or why not admission, treatments were needed:1} Final Clinical Impression(s) / ED Diagnoses Final diagnoses:  None    Rx / DC Orders ED Discharge Orders     None

## 2023-06-12 ENCOUNTER — Telehealth: Payer: Self-pay | Admitting: Radiation Oncology

## 2023-06-12 NOTE — Telephone Encounter (Signed)
Called patient to schedule a consultation w. Dr. Manning. No answer, LVM for a return call.  ?

## 2023-06-13 ENCOUNTER — Telehealth: Payer: Self-pay | Admitting: Radiation Oncology

## 2023-06-13 NOTE — Progress Notes (Signed)
 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  Sites of Visceral and Bony Metastatic Disease: Right hip joint  Location(s) of Symptomatic Metastases:Right hip joint  10/12/2022 [name], PA-C XR Hip Unilateral with/without Pelvis 2-3 views right  No SI joint sclerosis or narrowing was noted.  No significant hip joint narrowing was noted.  Mild spurring of acetabulum of the right hip was noted.  Spurring of pubic symphysis was noted. Degenerative changes were noted in the lumbar spine.   Impression: Mild osteoarthritic changes were noted in the right hip joint.  Degenerative changes were noted in the lumbar spine.    Past/Anticipated chemotherapy by medical oncology, if any: NA  Pain on a scale of 0-10 is:  4/10 right hip   If Spine Met(s), symptoms, if any, include: Bowel/Bladder retention or incontinence (please describe): No Numbness or weakness in extremities (please describe): No Current Decadron  regimen, if applicable: No  Ambulatory status? Walker? Wheelchair?:  Independent  SAFETY ISSUES: Prior radiation? 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 Ike Malady. Pacemaker/ICD? No Possible current pregnancy? Postmenopausal Is the patient on methotrexate? No  Current Complaints / other details:

## 2023-06-13 NOTE — Telephone Encounter (Signed)
Called patient to schedule a consultation w. Dr. Manning. No answer, LVM for a return call.  ?

## 2023-06-19 ENCOUNTER — Ambulatory Visit
Admission: RE | Admit: 2023-06-19 | Discharge: 2023-06-19 | Disposition: A | Discharge status: Home / Self Care | Source: Ambulatory Visit | Attending: Radiation Oncology | Admitting: Radiation Oncology

## 2023-06-19 ENCOUNTER — Ambulatory Visit
Admission: RE | Admit: 2023-06-19 | Discharge: 2023-06-19 | Disposition: A | Discharge status: Home / Self Care | Source: Ambulatory Visit | Attending: Radiation Oncology

## 2023-06-19 ENCOUNTER — Encounter: Payer: Self-pay | Admitting: Radiation Oncology

## 2023-06-19 VITALS — BP 157/84 | HR 94 | Temp 97.5°F | Resp 20 | Ht 64.0 in | Wt 163.2 lb

## 2023-06-19 DIAGNOSIS — C20 Malignant neoplasm of rectum: Secondary | ICD-10-CM | POA: Diagnosis present

## 2023-06-19 DIAGNOSIS — Z79899 Other long term (current) drug therapy: Secondary | ICD-10-CM | POA: Diagnosis not present

## 2023-06-19 DIAGNOSIS — I10 Essential (primary) hypertension: Secondary | ICD-10-CM | POA: Insufficient documentation

## 2023-06-19 DIAGNOSIS — Z800 Family history of malignant neoplasm of digestive organs: Secondary | ICD-10-CM | POA: Insufficient documentation

## 2023-06-19 DIAGNOSIS — Z510 Encounter for antineoplastic radiation therapy: Secondary | ICD-10-CM | POA: Insufficient documentation

## 2023-06-19 DIAGNOSIS — M1611 Unilateral primary osteoarthritis, right hip: Secondary | ICD-10-CM | POA: Insufficient documentation

## 2023-06-19 DIAGNOSIS — Z8052 Family history of malignant neoplasm of bladder: Secondary | ICD-10-CM | POA: Insufficient documentation

## 2023-06-19 DIAGNOSIS — E785 Hyperlipidemia, unspecified: Secondary | ICD-10-CM | POA: Diagnosis not present

## 2023-06-19 DIAGNOSIS — Z806 Family history of leukemia: Secondary | ICD-10-CM | POA: Insufficient documentation

## 2023-06-19 DIAGNOSIS — Z803 Family history of malignant neoplasm of breast: Secondary | ICD-10-CM | POA: Diagnosis not present

## 2023-06-19 DIAGNOSIS — Z923 Personal history of irradiation: Secondary | ICD-10-CM | POA: Insufficient documentation

## 2023-06-19 DIAGNOSIS — Z86000 Personal history of in-situ neoplasm of breast: Secondary | ICD-10-CM | POA: Insufficient documentation

## 2023-06-19 DIAGNOSIS — M160 Bilateral primary osteoarthritis of hip: Secondary | ICD-10-CM

## 2023-06-19 DIAGNOSIS — R262 Difficulty in walking, not elsewhere classified: Secondary | ICD-10-CM | POA: Insufficient documentation

## 2023-06-19 NOTE — Progress Notes (Signed)
  Radiation Oncology         [phone] ________________________________  Name: Yolanda Peters MRN: [id number]  Date: 06/19/2023  DOB: [birth year]  To Whom It May Concern:  I am writing to request prior authorization for medically necessary low dose radiation therapy (LDRT) for my patient, Yolanda Peters. [name], an 83 year old woman with function-limiting right hip osteoarthritis, refractory to standard conservative treatment modalities.  [name] has a long-standing history of severe degenerative joint disease, including bilateral total knee replacements and advanced osteoarthritis of the right hip. She has undergone prior physical therapy, pharmacologic therapy, and surgical interventions with only partial and transient benefit. Her symptoms now limit her ambulation, particularly on uneven terrain or during travel, and interfere with her activities of daily living. 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  In accordance with published [place] guidelines and peer-reviewed evidence, we propose a standard LDRT regimen of 3 Gy total, administered in six fractions of 0.5 Gy (3x/week for two weeks). This protocol is modeled after successful studies in [place] and the [place] and is commonly used in academic and community centers abroad to manage osteoarthritis, particularly in elderly patients who are not candidates for further surgery.  Evidence-Based Rationale:  Multiple studies have shown 60-80% response rates in pain relief and functional improvement following LDRT for osteoarthritis and other benign inflammatory conditions. A representative publication includes: Ilona Malta, et al. Randomized, double-blind, placebo-controlled trial on the effect of radiotherapy for painful heel spur (plantar fasciitis). Int [employer] Colonel Oncol Biol Phys.  2012;84(5):e667-e672. https://lucas.com/ Additional supportive data from retrospective analyses in hip and knee OA consistently demonstrate clinically meaningful pain relief with a very favorable safety profile.  Safety and Cost Considerations:  The total radiation dose is significantly lower than that used in cancer treatment, and the estimated lifetime attributable cancer risk is negligible, particularly in an elderly patient. LDRT represents a low-cost, low-toxicity alternative to ongoing pharmacologic management or surgical re-intervention. This therapy is non-invasive, well tolerated, and can prevent further healthcare utilization associated with worsening pain, falls, or mobility loss.   In summary, LDRT is a medically necessary and evidence-supported treatment for [name]'s refractory osteoarthritis. We respectfully request approval of this treatment so we may proceed without delay.  If additional clinical documentation or literature references are required, I would be happy to provide them.  Thank you for your time and consideration.  Sincerely,  Bartholome Ligas, MD, [facility] Radiation Oncology - [facility]

## 2023-06-19 NOTE — Progress Notes (Signed)
 Radiation Treatment Summary from [facility] - [place]:

## 2023-06-19 NOTE — Progress Notes (Signed)
  Radiation Oncology         [phone] ________________________________  Name: Yolanda Peters MRN: [id number]  Date: 06/19/2023  DOB: October 08, 1940  SIMULATION AND TREATMENT PLANNING NOTE    ICD-10-CM   1. Primary osteoarthritis of right hip  M16.11      DIAGNOSIS:  83 yo woman with recalcitrant osteoarthritis of the right hip  NARRATIVE:  The patient was brought to the CT Simulation planning suite.  Identity was confirmed.  All relevant records and images related to the planned course of therapy were reviewed.  The patient freely provided informed written consent to proceed with treatment after reviewing the details related to the planned course of therapy. The consent form was witnessed and verified by the simulation staff.  Then, the patient was set-up in a stable reproducible  supine position for radiation therapy.  CT images were obtained.  Surface markings were placed.  The CT images were loaded into the planning software.  Then the target and avoidance structures were contoured.  Treatment planning then occurred.  The radiation prescription was entered and confirmed.  Then, I designed and supervised the construction of a total of 3 medically necessary complex treatment devices including VacLoc bag molded to immobilize the legs, Two multileaf collimators (MLC) to shield critical structures in the pelvis and genitalia, from opposing gantry zero and 180 degree angles with independent collimation to account for beam divergence.  I have requested : Isodose Plan.  PLAN:  The patient will receive 3 Gy in 6 fractions, Mon, Wed, Fri starting on 06/26/2023.  ________________________________  Trilby Fujisawa. Lorri Rota, M.D.

## 2023-06-19 NOTE — Progress Notes (Signed)
 Radiation Oncology         [phone] ________________________________  Initial outpatient Consultation- CT SIM SAME DAY  Name: Yolanda Peters MRN: [id number]  Date of Service: 06/19/2023 DOB: Apr 22, 1940  VH:QIONG, Autry Legions, MD  Margarete Sharps, MD   REFERRING PHYSICIAN: Margarete Sharps, MD  DIAGNOSIS: 83 yo woman with recalcitrant osteoarthritis of the right hip    ICD-10-CM   1. Primary osteoarthritis of both hips  M16.0       HISTORY OF PRESENT ILLNESS: Yolanda Peters is an active and pleasant 83 year old woman with a complex musculoskeletal history, including bilateral knee replacements and longstanding osteoarthritis, particularly involving both hips and hands. 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 There are no clinical signs of autoimmune flare, and her most recent rheumatology follow-up confirmed stable status.  She presents today at the request of her daughter (a hospital employee), having learned about the availability of low dose radiation therapy (LDRT) for osteoarthritis through internal channels. She remains functionally independent but reports difficulty ambulating quickly, especially when navigating uneven terrain. During recent travel to [place], she required a cane for support due to hip and knee discomfort. While her pain has since improved, she continues to experience limitations in mobility, particularly with regard to speed and endurance.  Conservative treatments including physical therapy and joint replacement have provided partial benefit, but residual hip-related functional limitations persist. Her primary care physician, Dr. Mamie Searles, is aware of and supportive of proceeding with a trial of LDRT for symptomatic osteoarthritis involving the right hip.  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, skin cnacer   Cancer (HCC)    right breast    Hyperlipidemia    no meds taken   Hypertension    Personal history of radiation therapy 2011   rt breast      PAST SURGICAL HISTORY: Past Surgical History:  Procedure Laterality Date   BREAST DUCTAL SYSTEM EXCISION     right breast   BREAST LUMPECTOMY     CATARACT EXTRACTION     bilateral   CESAREAN SECTION     COLONOSCOPY     ELBOW SURGERY     left   I & D EXTREMITY Right 08/06/2020   Procedure: IRRIGATION AND DEBRIDEMENT OF LEG, wound vac placement;  Surgeon: Timothy Ford, MD;  Location: [facility] OR;  Service: Orthopedics;  Laterality: Right;   I & D EXTREMITY Right 08/11/2020   Procedure: IRRIGATION AND DEBRIDEMENT EXTREMITY SKIN GRAFT;  Surgeon: Timothy Ford, MD;  Location: [facility] OR;  Service: Orthopedics;  Laterality: Right;   JOINT REPLACEMENT     KNEE ARTHROPLASTY Left    March 2021   MYOMECTOMY     REPLACEMENT TOTAL KNEE Left 03/2019   TOTAL KNEE ARTHROPLASTY Right 06/26/2018   Procedure: TOTAL KNEE ARTHROPLASTY;  Surgeon: Claiborne Crew, MD;  Location: [facility] ORS;  Service: Orthopedics;  Laterality: Right;  70 mins    FAMILY HISTORY:  Family History  Problem Relation Age of Onset   Cancer Mother        bladder   Cancer Father  leukemia   Breast cancer Maternal Aunt 40 - 49   Breast cancer Maternal Aunt 50 - 59   Breast cancer Maternal Aunt 25 - 69   Colon cancer Paternal Aunt        pt is unsure of this   Colon cancer Cousin        pt is unsure of this   Esophageal cancer Neg Hx    Rectal cancer Neg Hx    Stomach cancer Neg Hx     SOCIAL HISTORY:  Social History   Socioeconomic History   Marital status: Married    Spouse name: Not on file   Number of children: Not on file    Years of education: Not on file   Highest education level: Not on file  Occupational History   Not on file  Tobacco Use   Smoking status: Never    Passive exposure: Never   Smokeless tobacco: Never  Vaping Use   Vaping status: Never Used  Substance and Sexual Activity   Alcohol use: Yes    Comment: occ   Drug use: No   Sexual activity: Yes  Other Topics Concern   Not on file  Social History Narrative   ** Merged History Encounter **       Social Drivers of Health   Financial Resource Strain: Not on file  Food Insecurity: Low Risk  (07/20/2022)   Received from [facility]   Hunger Vital Sign    Worried About Running Out of Food in the Last Year: Never true    Ran Out of Food in the Last Year: Never true  Transportation Needs: Not on file  Physical Activity: Not on file  Stress: Not on file  Social Connections: Unknown (05/25/2021)   Received from [facility], [facility]   Social Network    Social Network: Not on file  Intimate Partner Violence: Unknown (04/16/2021)   Received from [facility], [facility]   HITS    Physically Hurt: Not on file    Insult or Talk Down To: Not on file    Threaten Physical Harm: Not on file    Scream or Curse: Not on file    ALLERGIES: Ace inhibitors, Penicillins, and Tetracyclines & related  MEDICATIONS:  Current Outpatient Medications  Medication Sig Dispense Refill   Ascorbic Acid  (VITAMIN C) 1000 MG tablet Take 1,000 mg by mouth daily.     CALCIUM & MAGNESIUM  CARBONATES PO Take 2 tablets by mouth daily.     Cholecalciferol  (VITAMIN D3) 50 MCG (2000 UT) TABS Take 2,000 Units by mouth daily.     COVID-19 mRNA bivalent vaccine, Pfizer, (PFIZER COVID-19 VAC BIVALENT) injection Inject into the muscle. (Patient not taking: Reported on 10/01/2021) 0.3 mL 0   fluticasone (FLONASE) 50 MCG/ACT nasal spray Place 1 spray into both nostrils 2 (two) times daily as needed for allergies or rhinitis.     Ginger, Zingiber officinalis,  (GINGER ROOT PO) Take 200 mg by mouth 3 (three) times a week.     HYDROcodone -acetaminophen  (NORCO/VICODIN) 5-325 MG tablet Take 1-2 tablets by mouth every 4 (four) hours as needed for moderate pain (pain score 4-6). (Patient not taking: Reported on 10/12/2022) 10 tablet 0   LECITHIN PO Take 400 mg by mouth daily.     loratadine  (CLARITIN ) 10 MG tablet Take 10 mg by mouth daily.     Probiotic Product (PROBIOTIC DAILY PO) Take 1 capsule by mouth daily.      tiZANidine (ZANAFLEX) 2  MG tablet Take 2 mg by mouth daily as needed for muscle spasms.     trimethoprim  (TRIMPEX ) 100 MG tablet Take 100 mg by mouth daily.     verapamil  (VERELAN  PM) 360 MG 24 hr capsule Take 360 mg by mouth daily.      vitamin E  180 MG (400 UNITS) capsule Take 400 Units by mouth daily.     zinc gluconate 50 MG tablet Take 50 mg by mouth daily.     No current facility-administered medications for this encounter.    REVIEW OF SYSTEMS:  On review of systems, the patient reports that she is doing well overall. She denies any chest pain, shortness of breath, cough, fevers, chills, night sweats, or recent unintended weight changes. She denies any bowel or bladder disturbances, and denies abdominal pain, nausea or vomiting. She has some chronic bilateral hip pain that is exacerbated with activities and improves with rest. She does occasionally wake from sleep with pain at the lateral aspect of the right hip if she is laying on her right side for too long and/or with position changes. She denies any new musculoskeletal or joint aches or pains. A complete review of systems is obtained and is otherwise negative.    PHYSICAL EXAM:  Wt Readings from Last 3 Encounters:  06/19/23 163 lb 3.2 oz (74 kg)  05/15/23 176 lb 5.9 oz (80 kg)  10/12/22 169 lb 3.2 oz (76.7 kg)   Temp Readings from Last 3 Encounters:  06/19/23 (!) 97.5 F (36.4 C)  05/15/23 98.7 F (37.1 C)  08/11/20 97.9 F (36.6 C) (Oral)   BP Readings from Last 3  Encounters:  06/19/23 (!) 157/84  05/15/23 (!) 140/75  10/12/22 126/86   Pulse Readings from Last 3 Encounters:  06/19/23 94  05/15/23 77  10/12/22 87   Pain Assessment Pain Score: 4  Pain Loc: Hip (right hip)/10  In general this is a well appearing Caucasian woman in no acute distress. She's alert and oriented x4 and appropriate throughout the examination. Cardiopulmonary assessment is negative for acute distress and she exhibits normal effort.     KPS = 100  100 - Normal; no complaints; no evidence of disease. 90   - Able to carry on normal activity; minor signs or symptoms of disease. 80   - Normal activity with effort; some signs or symptoms of disease. 50   - Cares for self; unable to carry on normal activity or to do active work. 60   - Requires occasional assistance, but is able to care for most of his personal needs. 50   - Requires considerable assistance and frequent medical care. 40   - Disabled; requires special care and assistance. 30   - Severely disabled; hospital admission is indicated although death not imminent. 20   - Very sick; hospital admission necessary; active supportive treatment necessary. 10   - Moribund; fatal processes progressing rapidly. 0     - Dead  Karnofsky DA, Abelmann WH, Craver LS and Burchenal [facility] [phone]) The use of the nitrogen mustards in the palliative treatment of carcinoma: with particular reference to bronchogenic carcinoma Cancer 1 634-56  LABORATORY DATA:  Lab Results  Component Value Date   WBC 10.0 05/15/2023   HGB 15.2 (H) 05/15/2023   HCT 49.0 (H) 05/15/2023   MCV 89.9 05/15/2023   PLT 235 05/15/2023   Lab Results  Component Value Date   NA 136 05/15/2023   K 3.0 (L) 05/15/2023   CL 103 05/15/2023  CO2 19 (L) 05/15/2023   Lab Results  Component Value Date   ALT 24 05/15/2023   AST 22 05/15/2023   ALKPHOS 68 05/15/2023   BILITOT 0.9 05/15/2023     RADIOGRAPHY: No results found.    IMPRESSION/PLAN: 1. 83 y.o.  woman with mild osteoarthritis of right hip Chronic right hip pain due to osteoarthritis, recalcitrant to standard therapies, with functional limitation Remote history of autoimmune disease (inactive) without current immunosuppression Candidate for LDRT based on symptoms, performance status, and multidisciplinary support No contraindications to low dose radiation identified at this time  After a detailed discussion of the patient's history, prior treatment response, and current goals, we reviewed the proposed protocol for LDRT targeting the right hip. Our institutional approach follows a regimen of 3 Gy total, delivered in 6 fractions of 0.5 Gy on a Monday-Wednesday-Friday schedule over two weeks. This dosing is consistent with published guidelines and peer-reviewed data for non-malignant musculoskeletal conditions.  We reviewed:  Goals of care: symptom relief and improved mobility Expected timeline for therapeutic benefit: typically several weeks to months Potential risks, including rare but theoretical concerns regarding late tissue effects or secondary malignancy (risk estimated to be extremely low at this dose and age) Lack of immunosuppression and inactive autoimmune history, minimizing concern for immune-mediated complications  My goal with low dose radiation therapy is to help reduce chronic joint pain and improve her mobility, particularly for activities like walking or standing for longer periods. This treatment does not reverse joint damage, but it can calm the inflammation in and around the joint that contributes to pain. Based on published data, about 60-80% of patients with osteoarthritis experience meaningful pain relief within a few weeks to months after completing a short course of low-dose radiation. While not everyone responds, the majority of patients do report improvement, and the treatment is generally well tolerated.  Yolanda Peters was agreeable to proceed. We will arrange CT  simulation for planning and initiate treatment pending final review and setup. A follow-up visit will be scheduled approximately 8-12 weeks after completion of LDRT to assess clinical response.  We personally spent 60 minutes in this encounter including chart review, reviewing radiological studies, meeting face-to-face with the patient, entering orders and completing documentation.    Arta Bihari, PA-C    Kenith Payer, MD  [facility]  Radiation Oncology Direct Dial: [phone]  Fax: [phone] [email or web address]  Skype  LinkedIn   This document serves as a record of services personally performed by Kenith Payer, MD and Keitha Pata, PA-C. It was created on their behalf by Florance Hun, a trained medical scribe. The creation of this record is based on the scribe's personal observations and the provider's statements to them. This document has been checked and approved by the attending provider.      Reference: Ilona Malta, et al. Randomized, double-blind, placebo-controlled trial on the effect of radiotherapy for painful heel spur (plantar fasciitis). Int [facility] Colonel Oncol Biol Phys. 2012;84(5):e667-e672. https://lucas.com/

## 2023-06-21 DIAGNOSIS — Z510 Encounter for antineoplastic radiation therapy: Secondary | ICD-10-CM | POA: Diagnosis not present

## 2023-06-21 DIAGNOSIS — C20 Malignant neoplasm of rectum: Secondary | ICD-10-CM | POA: Diagnosis not present

## 2023-06-26 ENCOUNTER — Ambulatory Visit
Admission: RE | Admit: 2023-06-26 | Discharge: 2023-06-26 | Disposition: A | Discharge status: Home / Self Care | Source: Ambulatory Visit | Attending: Radiation Oncology

## 2023-06-26 ENCOUNTER — Other Ambulatory Visit: Payer: Self-pay

## 2023-06-26 DIAGNOSIS — Z510 Encounter for antineoplastic radiation therapy: Secondary | ICD-10-CM | POA: Diagnosis not present

## 2023-06-26 DIAGNOSIS — C20 Malignant neoplasm of rectum: Secondary | ICD-10-CM | POA: Diagnosis not present

## 2023-06-26 LAB — RAD ONC ARIA SESSION SUMMARY
Course Elapsed Days: 0
Plan Fractions Treated to Date: 1
Plan Prescribed Dose Per Fraction: 0.5 Gy
Plan Total Fractions Prescribed: 6
Plan Total Prescribed Dose: 3 Gy
Reference Point Dosage Given to Date: 0.5 Gy
Reference Point Session Dosage Given: 0.5 Gy
Session Number: 1

## 2023-06-27 ENCOUNTER — Ambulatory Visit

## 2023-06-28 ENCOUNTER — Ambulatory Visit
Admission: RE | Admit: 2023-06-28 | Discharge: 2023-06-28 | Disposition: A | Discharge status: Home / Self Care | Source: Ambulatory Visit | Attending: Radiation Oncology | Admitting: Radiation Oncology

## 2023-06-28 ENCOUNTER — Other Ambulatory Visit: Payer: Self-pay

## 2023-06-28 DIAGNOSIS — Z510 Encounter for antineoplastic radiation therapy: Secondary | ICD-10-CM | POA: Diagnosis not present

## 2023-06-28 DIAGNOSIS — C20 Malignant neoplasm of rectum: Secondary | ICD-10-CM | POA: Diagnosis not present

## 2023-06-28 LAB — RAD ONC ARIA SESSION SUMMARY
Course Elapsed Days: 2
Plan Fractions Treated to Date: 2
Plan Prescribed Dose Per Fraction: 0.5 Gy
Plan Total Fractions Prescribed: 6
Plan Total Prescribed Dose: 3 Gy
Reference Point Dosage Given to Date: 1 Gy
Reference Point Session Dosage Given: 0.5 Gy
Session Number: 2

## 2023-06-29 ENCOUNTER — Ambulatory Visit

## 2023-06-30 ENCOUNTER — Ambulatory Visit
Admission: RE | Admit: 2023-06-30 | Discharge: 2023-06-30 | Disposition: A | Discharge status: Home / Self Care | Source: Ambulatory Visit | Attending: Radiation Oncology | Admitting: Radiation Oncology

## 2023-06-30 ENCOUNTER — Other Ambulatory Visit: Payer: Self-pay

## 2023-06-30 DIAGNOSIS — Z510 Encounter for antineoplastic radiation therapy: Secondary | ICD-10-CM | POA: Diagnosis not present

## 2023-06-30 DIAGNOSIS — C20 Malignant neoplasm of rectum: Secondary | ICD-10-CM | POA: Diagnosis not present

## 2023-06-30 LAB — RAD ONC ARIA SESSION SUMMARY
Course Elapsed Days: 4
Plan Fractions Treated to Date: 3
Plan Prescribed Dose Per Fraction: 0.5 Gy
Plan Total Fractions Prescribed: 6
Plan Total Prescribed Dose: 3 Gy
Reference Point Dosage Given to Date: 1.5 Gy
Reference Point Session Dosage Given: 0.5 Gy
Session Number: 3

## 2023-07-03 ENCOUNTER — Ambulatory Visit
Admission: RE | Admit: 2023-07-03 | Discharge: 2023-07-03 | Disposition: A | Discharge status: Home / Self Care | Source: Ambulatory Visit | Attending: Radiation Oncology | Admitting: Radiation Oncology

## 2023-07-03 ENCOUNTER — Other Ambulatory Visit: Payer: Self-pay

## 2023-07-03 DIAGNOSIS — Z510 Encounter for antineoplastic radiation therapy: Secondary | ICD-10-CM | POA: Diagnosis not present

## 2023-07-03 DIAGNOSIS — C20 Malignant neoplasm of rectum: Secondary | ICD-10-CM | POA: Diagnosis not present

## 2023-07-03 LAB — RAD ONC ARIA SESSION SUMMARY
Course Elapsed Days: 7
Plan Fractions Treated to Date: 4
Plan Prescribed Dose Per Fraction: 0.5 Gy
Plan Total Fractions Prescribed: 6
Plan Total Prescribed Dose: 3 Gy
Reference Point Dosage Given to Date: 2 Gy
Reference Point Session Dosage Given: 0.5 Gy
Session Number: 4

## 2023-07-05 ENCOUNTER — Ambulatory Visit
Admission: RE | Admit: 2023-07-05 | Discharge: 2023-07-05 | Disposition: A | Discharge status: Home / Self Care | Source: Ambulatory Visit | Attending: Radiation Oncology

## 2023-07-05 ENCOUNTER — Other Ambulatory Visit: Payer: Self-pay

## 2023-07-05 DIAGNOSIS — Z510 Encounter for antineoplastic radiation therapy: Secondary | ICD-10-CM | POA: Diagnosis not present

## 2023-07-05 DIAGNOSIS — C20 Malignant neoplasm of rectum: Secondary | ICD-10-CM | POA: Diagnosis not present

## 2023-07-05 LAB — RAD ONC ARIA SESSION SUMMARY
Course Elapsed Days: 9
Plan Fractions Treated to Date: 5
Plan Prescribed Dose Per Fraction: 0.5 Gy
Plan Total Fractions Prescribed: 6
Plan Total Prescribed Dose: 3 Gy
Reference Point Dosage Given to Date: 2.5 Gy
Reference Point Session Dosage Given: 0.5 Gy
Session Number: 5

## 2023-07-07 ENCOUNTER — Ambulatory Visit
Admission: RE | Admit: 2023-07-07 | Discharge: 2023-07-07 | Disposition: A | Discharge status: Home / Self Care | Source: Ambulatory Visit | Attending: Radiation Oncology

## 2023-07-07 ENCOUNTER — Other Ambulatory Visit: Payer: Self-pay

## 2023-07-07 ENCOUNTER — Ambulatory Visit

## 2023-07-07 DIAGNOSIS — Z510 Encounter for antineoplastic radiation therapy: Secondary | ICD-10-CM | POA: Diagnosis not present

## 2023-07-07 DIAGNOSIS — C20 Malignant neoplasm of rectum: Secondary | ICD-10-CM | POA: Diagnosis not present

## 2023-07-07 LAB — RAD ONC ARIA SESSION SUMMARY
Course Elapsed Days: 11
Plan Fractions Treated to Date: 6
Plan Prescribed Dose Per Fraction: 0.5 Gy
Plan Total Fractions Prescribed: 6
Plan Total Prescribed Dose: 3 Gy
Reference Point Dosage Given to Date: 3 Gy
Reference Point Session Dosage Given: 0.5 Gy
Session Number: 6

## 2023-07-10 NOTE — Radiation Completion Notes (Signed)
  Radiation Oncology         [phone] ________________________________  Name: Yolanda Peters MRN: [id number]  Date: 07/07/2023  DOB: [birth year]  Referring Physician: NORLEEN JUNGLING, M.D. Date of Service: 2023-07-10 Radiation Oncologist: Adina Barge, M.D. [facility]     RADIATION ONCOLOGY END OF TREATMENT NOTE     Diagnosis:  83 yo woman with recalcitrant osteoarthritis of the right hip   Intent: Curative     ==========DELIVERED PLANS==========  First Treatment Date: 2023-06-26 Last Treatment Date: 2023-07-07   Plan Name: Pelvis_R Site: Hip, Right Technique: Isodose Plan Mode: Photon Dose Per Fraction: 0.5 Gy Prescribed Dose (Delivered / Prescribed): 3 Gy / 3 Gy Prescribed Fxs (Delivered / Prescribed): 6 / 6     ==========ON TREATMENT VISIT DATES========== 2023-06-30, 2023-07-07   See weekly On Treatment Notes in Epic for details in the Media tab (listed as Progress notes on the On Treatment Visit Dates listed above).  She tolerated the radiation treatment very well with only mild fatigue.  The patient will receive a call in about one month from the radiation oncology department. She will continue follow up with her PCP, Dr. JUNGLING, as well.  ------------------------------------------------   Donnice Barge, MD [facility]  Radiation Oncology Direct Dial: [phone]  Fax: [phone] [email or web address]  Skype  LinkedIn

## 2023-08-08 ENCOUNTER — Ambulatory Visit
Admission: RE | Admit: 2023-08-08 | Discharge: 2023-08-08 | Disposition: A | Discharge status: Home / Self Care | Source: Ambulatory Visit | Attending: Radiation Oncology | Admitting: Radiation Oncology

## 2023-08-08 NOTE — Progress Notes (Signed)
  Radiation Oncology         [phone] ________________________________  Name: Yolanda Peters MRN: [id number]  Date of Service: 08/08/2023  DOB: December 04, 1940  Post Treatment Telephone Note  Diagnosis:  recalcitrant osteoarthritis of the right hip (as documented in provider EOT note)  The patient was not available for call today. Unable to reach patient for today's appointment x 2 calls. No answer. Message was left w/ my extension [phone]. Call complete.  The patient is scheduled for ongoing care with Dr. Onita in medical oncology. The patient was encouraged to call if she develops concerns or questions regarding radiation.   This concludes the interaction.  Rosaline Minerva, LPN

## 2023-08-10 ENCOUNTER — Other Ambulatory Visit (HOSPITAL_BASED_OUTPATIENT_CLINIC_OR_DEPARTMENT_OTHER): Payer: Self-pay

## 2023-09-06 NOTE — Progress Notes (Addendum)
 Office Visit Note  Patient: Yolanda Peters             Date of Birth: September 22, 1940           MRN: [id number]             PCP: Onita Rush, MD Referring: Onita Rush, MD Visit Date: 09/20/2023 Occupation: @GUAROCC @  Subjective:  Right hip pain   History of Present Illness: Yolanda Peters is a 83 y.o. female with autoimmune disease and osteoarthritis.  She states recently she has been having pain in her right hip which she describes over the right trochanteric region.  She states she has been also having some lower back pain.  There is no radiculopathy.  She feels heaviness in her knee joints occasionally.  None of the other joints are painful.  She denies any history of morning stiffness, oral ulcers, sicca symptoms, malar rash, photosensitivity Raynauds or lymphadenopathy.    Activities of Daily Living:  Patient reports morning stiffness for 0 minute.   Patient Reports nocturnal pain.  Difficulty dressing/grooming: Denies Difficulty climbing stairs: Denies Difficulty getting out of chair: Reports Difficulty using hands for taps, buttons, cutlery, and/or writing: Denies  Review of Systems  Constitutional:  Negative for fatigue.  HENT:  Positive for mouth dryness. Negative for mouth sores.   Eyes:  Negative for dryness.  Respiratory:  Negative for shortness of breath.   Cardiovascular:  Negative for chest pain and palpitations.  Gastrointestinal:  Negative for blood in stool, constipation and diarrhea.  Endocrine: Negative for increased urination.  Genitourinary:  Negative for involuntary urination.  Musculoskeletal:  Positive for gait problem, myalgias and myalgias. Negative for joint pain, joint pain, joint swelling, muscle weakness, morning stiffness and muscle tenderness.  Skin:  Negative for color change, rash, hair loss and sensitivity to sunlight.  Allergic/Immunologic: Negative for susceptible to infections.  Neurological:  Negative for dizziness and headaches.   Hematological:  Negative for swollen glands.  Psychiatric/Behavioral:  Negative for depressed mood and sleep disturbance. The patient is not nervous/anxious.     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   HTN (hypertension) 02/24/2014   Hyperlipidemia 02/24/2014   Breast cancer, right, upper outer quadrant  07/05/2010    Past Medical History:  Diagnosis Date   Allergy    Arthritis    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, skin cnacer   Cancer (HCC)    right breast    Hyperlipidemia    no meds taken   Hypertension    Personal history of radiation therapy 2011   rt breast    Family History  Problem Relation Age of Onset   Cancer Mother        bladder   Cancer Father        leukemia   Breast cancer Maternal Aunt 40 - 49   Breast cancer Maternal Aunt 50 - 59   Breast cancer Maternal Aunt 60 - 69  Colon cancer Paternal Aunt        pt is unsure of this   Colon cancer Cousin        pt is unsure of this   Esophageal cancer Neg Hx    Rectal cancer Neg Hx    Stomach cancer Neg Hx    Past Surgical History:  Procedure Laterality Date   BREAST DUCTAL SYSTEM EXCISION     right breast   BREAST LUMPECTOMY     CATARACT EXTRACTION     bilateral   CESAREAN SECTION     COLONOSCOPY     ELBOW SURGERY     left   I & D EXTREMITY Right 08/06/2020   Procedure: IRRIGATION AND DEBRIDEMENT OF LEG, wound vac placement;  Surgeon: Harden Jerona GAILS, MD;  Location: [facility] OR;  Service:  Orthopedics;  Laterality: Right;   I & D EXTREMITY Right 08/11/2020   Procedure: IRRIGATION AND DEBRIDEMENT EXTREMITY SKIN GRAFT;  Surgeon: Harden Jerona GAILS, MD;  Location: [facility] OR;  Service: Orthopedics;  Laterality: Right;   JOINT REPLACEMENT     KNEE ARTHROPLASTY Left    March 2021   MYOMECTOMY     REPLACEMENT TOTAL KNEE Left 03/2019   TOTAL KNEE ARTHROPLASTY Right 06/26/2018   Procedure: TOTAL KNEE ARTHROPLASTY;  Surgeon: Ernie Cough, MD;  Location: [facility] ORS;  Service: Orthopedics;  Laterality: Right;  70 mins   Social History   Social History Narrative   ** Merged History Encounter **       Immunization History  Administered Date(s) Administered   Fluzone Influenza virus vaccine,trivalent (IIV3), split virus 10/20/2005, 09/10/2009, 09/27/2010, 09/29/2011, 11/05/2013, 09/08/2022   INFLUENZA, HIGH DOSE SEASONAL PF 10/15/2021   Influenza Split 09/24/2013, 09/24/2014   Influenza, Mdck, Trivalent,PF 6+ MOS(egg free) 09/07/2015, 09/08/2016   Influenza, Quadrivalent, Recombinant, Inj, Pf 09/14/2017, 09/11/2018, 09/28/2020   Influenza,inj,Quad PF,6+ Mos 11/05/2013, 09/27/2014   Influenza-Unspecified 09/28/2022   Moderna Sars-Covid-2 Vaccination 09/08/2022   PFIZER(Purple Top)SARS-COV-2 Vaccination 01/22/2019, 02/11/2019, 09/27/2019, 01/20/2020, 10/12/2021   Pfizer Covid-19 Vaccine Bivalent Booster 73yrs & up 10/07/2020   Pneumococcal Conjugate-13 08/26/2013   Pneumococcal Polysaccharide-23 11/08/2010, 09/19/2019   Td (Adult),5 Lf Tetanus Toxid, Preservative Free 08/18/2014   Tdap 08/28/2014   Zoster Recombinant(Shingrix) 10/12/2017, 12/10/2017   Zoster, Live 11/02/2005     Objective: Vital Signs: BP 122/79 (BP Location: Left Arm, Patient Position: Sitting, Cuff Size: Normal)   Pulse 90   Resp 14   Ht 5' 3.5 (1.613 m)   Wt 163 lb 3.2 oz (74 kg)   BMI 28.46 kg/m    Physical Exam Vitals and nursing note reviewed.  Constitutional:      Appearance: She is well-developed.  HENT:      Head: Normocephalic and atraumatic.  Eyes:     Conjunctiva/sclera: Conjunctivae normal.  Cardiovascular:     Rate and Rhythm: Normal rate and regular rhythm.     Heart sounds: Normal heart sounds.  Pulmonary:     Effort: Pulmonary effort is normal.     Breath sounds: Normal breath sounds.  Abdominal:     General: Bowel sounds are normal.     Palpations: Abdomen is soft.  Musculoskeletal:     Cervical back: Normal range of motion.  Lymphadenopathy:     Cervical: No cervical adenopathy.  Skin:    General: Skin is warm and dry.     Capillary Refill: Capillary refill takes less than 2 seconds.  Neurological:     Mental Status: She is alert and oriented to  person, place, and time.  Psychiatric:        Behavior: Behavior normal.      Musculoskeletal Exam: She had limited lateral rotation of the cervical spine.  She had limited range of motion of the lumbar spine without any point tenderness.  Shoulders, elbows, wrist joints were in good range of motion.  There was no tenderness over wrist joints and no synovitis.  No synovitis was noted over MCP joints.  Bilateral PIP and DIP subluxation was noted.  Hip joints in good range of motion.  She had tenderness over right trochanteric bursa.  Knee joints were in good range of motion which were replaced.  She had no tenderness over her ankles or MTPs.  CDAI Exam: CDAI Score: -- Patient Global: --; Provider Global: -- Swollen: --; Tender: -- Joint Exam 09/20/2023   No joint exam has been documented for this visit   There is currently no information documented on the homunculus. Go to the Rheumatology activity and complete the homunculus joint exam.  Investigation: No additional findings.  Imaging: No results found.  Recent Labs: Lab Results  Component Value Date   WBC 10.0 05/15/2023   HGB 15.2 (H) 05/15/2023   PLT 235 05/15/2023   NA 136 05/15/2023   K 3.0 (L) 05/15/2023   CL 103 05/15/2023   CO2 19 (L) 05/15/2023   GLUCOSE  133 (H) 05/15/2023   BUN 23 05/15/2023   CREATININE 0.73 05/15/2023   BILITOT 0.9 05/15/2023   ALKPHOS 68 05/15/2023   AST 22 05/15/2023   ALT 24 05/15/2023   PROT 6.8 05/15/2023   ALBUMIN 3.9 05/15/2023   CALCIUM 8.5 (L) 05/15/2023   GFRAA >60 06/27/2018    Speciality Comments: [facility]  Procedures:  Large Joint Inj: R greater trochanter on 09/20/2023 11:13 AM Indications: pain Details: 27 G 1.5 in needle, lateral approach  Arthrogram: No  Medications: 40 mg triamcinolone  acetonide 40 MG/ML; 1.5 mL lidocaine  1 % Aspirate: 0 mL Outcome: tolerated well, no immediate complications  Risk of infection, tendon injury, nerve injury, dermal atrophy and hypopigmentation were discussed. Procedure, treatment alternatives, risks and benefits explained, specific risks discussed. Consent was given by the patient. Immediately prior to procedure a time out was called to verify the correct patient, procedure, equipment, support staff and site/side marked as required. Patient was prepped and draped in the usual sterile fashion.     Allergies: Ace inhibitors, Penicillins, and Tetracyclines & related   Assessment / Plan:     Visit Diagnoses: Autoimmune disease (HCC) - In remission: labs 10/12/2022 dsDNA is negative, Complements WNL, ANA remains positive--titer unchanged, ESR WNL.  She has had no symptoms of autoimmune disease flare.  She gives history of dry mouth.  There is no history of oral ulcers, nasal ulcers, malar rash, photosensitivity, Raynaud's or lymphadenopathy.  She will get repeat labs to Dr. Monia office which will include CBC with differential, CMP with GFR, UA, sed rate, complements, ANA and double-stranded DNA.  Primary osteoarthritis of both hands-she has severe osteoarthritis in her hands with DIP and PIP thickening and PIP subluxation.  Joint protection was discussed.  Status post total bilateral knee replacement-she  good range of motion without discomfort.  Trochanteric bursitis, right hip-she had tenderness over right trochanteric bursa.  She had x-rays at the last visit which were reviewed with the patient.  It showed only mild osteoarthritis.  She had good range of motion of her  right hip joint.  I gave her a handout on IT band stretches.  I advised her if her symptoms persist we can refer her to physical therapy or she can come in for a cortisone injection. Addendum: Patient came back after few minutes and requested a cortisone injection to her right trochanteric region.  Side effects of cortisone injection were discussed cost as described above.  Right trochanteric bursa was injected with lidocaine  and Kenalog  as described above.  Patient tolerated the procedure well.  Postprocedure instructions were given.  Primary osteoarthritis of both feet-she is osteoarthritic changes in her feet.  No synovitis was noted.  Proper fitting shoes were advised.  Chronic midline low back pain without sciatica-she complains of some lower back discomfort.  Previous x-rays of the hip showed some lumbar spine which showed degenerative changes.  Core strength exercises were discussed.  Osteopenia of multiple sites - October 25, 2019 T-score -1.5 [facility]  She should get repeat DEXA scan.  Patient plans to get it through Dr. Monia office.  History of hyperlipidemia  History of hypertension-blood pressure was normal today at 122/79.  History of breast cancer  Orders: No orders of the defined types were placed in this encounter.  No orders of the defined types were placed in this encounter.    Follow-Up Instructions: Return in about 1 year (around 09/19/2024) for Osteoarthritis, Autoimmune disease.   Maya Nash, MD  Note - This record has been created using Animal nutritionist.  Chart creation errors have been sought, but may not always  have been located. Such creation errors do  not reflect on  the standard of medical care.

## 2023-09-20 ENCOUNTER — Encounter: Payer: Self-pay | Admitting: Rheumatology

## 2023-09-20 ENCOUNTER — Ambulatory Visit: Payer: Medicare Other | Attending: Rheumatology | Admitting: Rheumatology

## 2023-09-20 VITALS — BP 122/79 | HR 90 | Resp 14 | Ht 63.5 in | Wt 163.2 lb

## 2023-09-20 DIAGNOSIS — M19041 Primary osteoarthritis, right hand: Secondary | ICD-10-CM

## 2023-09-20 DIAGNOSIS — M19072 Primary osteoarthritis, left ankle and foot: Secondary | ICD-10-CM

## 2023-09-20 DIAGNOSIS — G8929 Other chronic pain: Secondary | ICD-10-CM

## 2023-09-20 DIAGNOSIS — Z8679 Personal history of other diseases of the circulatory system: Secondary | ICD-10-CM

## 2023-09-20 DIAGNOSIS — M359 Systemic involvement of connective tissue, unspecified: Secondary | ICD-10-CM

## 2023-09-20 DIAGNOSIS — M7061 Trochanteric bursitis, right hip: Secondary | ICD-10-CM

## 2023-09-20 DIAGNOSIS — M545 Low back pain, unspecified: Secondary | ICD-10-CM

## 2023-09-20 DIAGNOSIS — M25551 Pain in right hip: Secondary | ICD-10-CM

## 2023-09-20 DIAGNOSIS — M19071 Primary osteoarthritis, right ankle and foot: Secondary | ICD-10-CM

## 2023-09-20 DIAGNOSIS — Z96653 Presence of artificial knee joint, bilateral: Secondary | ICD-10-CM | POA: Diagnosis not present

## 2023-09-20 DIAGNOSIS — M19042 Primary osteoarthritis, left hand: Secondary | ICD-10-CM

## 2023-09-20 DIAGNOSIS — Z853 Personal history of malignant neoplasm of breast: Secondary | ICD-10-CM

## 2023-09-20 DIAGNOSIS — Z8639 Personal history of other endocrine, nutritional and metabolic disease: Secondary | ICD-10-CM

## 2023-09-20 DIAGNOSIS — M8589 Other specified disorders of bone density and structure, multiple sites: Secondary | ICD-10-CM

## 2023-09-20 MED ORDER — TRIAMCINOLONE ACETONIDE 40 MG/ML IJ SUSP
40.0000 mg | INTRAMUSCULAR | Status: AC | PRN
  Administered 2023-09-20: 40 mg via INTRA_ARTICULAR

## 2023-09-20 MED ORDER — LIDOCAINE HCL 1 % IJ SOLN
1.5000 mL | INTRAMUSCULAR | Status: AC | PRN
  Administered 2023-09-20: 1.5 mL

## 2023-09-20 NOTE — Patient Instructions (Addendum)
 Hip Bursitis  Hip bursitis is swelling of one or more fluid-filled sacs (bursae) in your hip joint. If the bursa becomes irritated, it can fill with extra fluid and become swollen. This condition can cause pain, and your symptoms may come and go over time. What are the causes? Repeated use of your hip muscles. Injury to the hip. Weak butt muscles. Bone spurs. Infection. In some cases, the cause may not be known. What increases the risk? Having a past hip injury or hip surgery. Having a condition, such as arthritis, gout, diabetes, or thyroid  disease. Having spine problems. Having one leg that is shorter than the other. Running a lot or doing long-distance running. Playing sports where there is a risk of injury or falling, such as football, martial arts, or skiing. What are the signs or symptoms? Symptoms may come and go, and they often include: Pain in the hip or groin area. Pain may get worse when you move your hip. Tenderness and swelling of the hip. In rare cases, the bursa may become infected. If this happens, you may: Get a fever. Have warmth and redness in the hip area. How is this treated? This condition is treated by: Resting your hip. Icing your hip. Wrapping the hip area with an elastic bandage (compression wrap). Keeping the hip raised. Other treatments may include: Using crutches, a cane, or a walker. Medicines. Draining fluid out of the bursa. Surgery to take out a bursa. This is rare. Long-term treatment may include: Doing exercises to help your strength and flexibility. Lifestyle changes like losing weight to lessen the strain on your hip. Follow these instructions at home: Managing pain, stiffness, and swelling     If told, put ice on the painful area. To do this: Put ice in a plastic bag. Place a towel between your skin and the bag. Leave the ice on for 20 minutes, 2-3 times a day. Take off the ice if your skin turns bright red. This is very important.  If you cannot feel pain, heat, or cold, you have a greater risk of damage to the area. Raise your hip by putting a pillow under your hips while you lie down. Stop if you feel pain. If told, put heat on the affected area. Do this as often as told by your doctor. Use the heat source that your doctor recommends, such as a moist heat pack or a heating pad. Place a towel between your skin and the heat source. Leave the heat on for 20-30 minutes. Take off the heat if your skin turns bright red. This is very important. If you cannot feel pain, heat, or cold, you have a greater risk of getting burned. Activity Do not use your hip to support your body weight until your doctor says that you can. Use crutches, a cane, or a walker as told by your doctor. If the affected leg is one that you use to drive, ask your doctor if it is safe to drive. Rest and protect your hip as much as you can until you feel better. Return to your normal activities when your doctor says that it is safe. Do exercises as told by your doctor. General instructions Take over-the-counter and prescription medicines only as told by your doctor. Gently rub and stretch your injured area as often as is comfortable. Wear elastic bandages only as told by your doctor. If one of your legs is shorter than the other, get fitted for a shoe insert or orthotic. Keep a healthy  weight. Follow instructions from your doctor. Keep all follow-up visits. How is this prevented? Exercise regularly or as told by your doctor. Wear the right shoes for the sport you play and for daily activities. Warm up and stretch before being active. Cool down and stretch after being active. Take breaks often from repeated activity. Avoid activities that bother your hip or cause pain. Avoid sitting down for a long time. Where to find more information [employer]: orthoinfo.aaos.org Contact a doctor if: You have a fever. You have new  symptoms. You have trouble walking or doing everyday activities. You have pain that gets worse or does not get better with medicine. The skin around your hip is red. You get a feeling of warmth in your hip area. Get help right away if: You cannot move your hip. You have very bad pain. You cannot control the muscles in your feet. Summary Hip bursitis is swelling of one or more fluid-filled sacs (bursae) in your hip joint. Symptoms often come and go over time. This condition is often treated by resting and icing the hip. It also may help to keep the area raised and wrapped in an elastic bandage. Other treatments may be needed.   Iliotibial Band Syndrome Rehab Ask your health care provider which exercises are safe for you. Do exercises exactly as told by your provider and adjust them as told. It's normal to feel mild stretching, pulling, tightness, or discomfort as you do these exercises. Stop right away if you feel sudden pain or your pain gets a lot worse. Do not begin these exercises until told by your provider. Stretching and range-of-motion exercises These exercises warm up your muscles and joints. They also improve the movement and flexibility of your hip and pelvis. Quadriceps stretch, prone  Lie face down (prone) on a firm surface like a bed or padded floor. Bend your left / right knee. Reach back to hold your ankle or pant leg. If you can't reach your ankle or pant leg, use a belt looped around your foot and grab the belt instead. Gently pull your heel toward your butt. Your knee should not slide out to the side. You should feel a stretch in the front of your thigh and knee, also called the quadriceps. Hold this position for __________ seconds. Repeat __________ times. Complete this exercise __________ times a day. Iliotibial band stretch The iliotibial band is a strip of tissue that runs along the outside of your hip down to your knee. Lie on your side with your left / right leg on  top. Bend both knees and grab your left / right ankle. Stretch out your bottom arm to help you balance. Slowly bring your top knee back so your thigh goes behind your back. Slowly lower your top leg toward the floor until you feel a gentle stretch on the outside of your left / right hip and thigh. If you don't feel a stretch and your knee won't go farther, place the heel of your other foot on top of your knee and pull your knee down toward the floor with your foot. Hold this position for __________ seconds. Repeat __________ times. Complete this exercise __________ times a day. Strengthening exercises These exercises build strength and endurance in your hip and pelvis. Endurance means your muscles can keep working even when they're tired. Straight leg raises, side-lying This exercise strengthens the muscles that rotate the leg at the hip and move it away from your body. These muscles  are called hip abductors. Lie on your side with your left / right leg on top. Lie so your head, shoulder, hip, and knee line up. You can bend your bottom knee to help you balance. Roll your hips slightly forward so they're stacked directly over each other. Your left / right knee should face forward. Tense the muscles in your outer thigh and hip. Lift your top leg 4-6 inches (10-15 cm) off the ground. Hold this position for __________ seconds. Slowly lower your leg back down to the starting position. Let your muscles fully relax before doing this exercise again. Repeat __________ times. Complete this exercise __________ times a day. Leg raises, prone This exercise strengthens the muscles that move the hips backward. These muscles are called hip extensors. Lie face down (prone) on your bed or a firm surface. You can put a pillow under your hips for comfort and to support your lower back. Bend your left / right knee so your foot points straight up toward the ceiling. Keep the other leg straight and behind you. Squeeze  your butt muscles. Lift your left / right thigh off the firm surface. Do not let your back arch. Tense your thigh muscle as hard as you can without having more knee pain. Hold this position for __________ seconds. Slowly lower your leg to the starting position. Allow your leg to relax all the way. Repeat __________ times. Complete this exercise __________ times a day. Hip hike  Stand sideways on a bottom step. Place your feet so that your left / right leg is on the step, and the other foot is hanging off the side. If you need support for balance, hold onto a railing or wall. Keep your knees straight and your abdomen square, meaning your hips are level. Then, lift your left / right hip up toward the ceiling. Slowly let your leg that's hanging off the step lower towards the floor. Your foot should get closer to the ground. Do not lean or bend your knees during this movement. Repeat __________ times. Complete this exercise __________ times a day. This information is not intended to replace advice given to you by your health care provider. Make sure you discuss any questions you have with your health care provider. Document Revised: 03/11/2022 Document Reviewed: 03/11/2022 Elsevier Patient Education  2024 [employer]. This information is not intended to replace advice given to you by your health care provider. Make sure you discuss any questions you have with your health care provider. Document Revised: 12/22/2020 Document Reviewed: 12/22/2020 Elsevier Patient Education  2024 [employer]  Low Back Sprain or Strain Rehab Ask your health care provider which exercises are safe for you. Do exercises exactly as told by your health care provider and adjust them as directed. It is normal to feel mild stretching, pulling, tightness, or discomfort as you do these exercises. Stop right away if you feel sudden pain or your pain gets worse. Do not begin these exercises until told by your health care  provider. Stretching and range-of-motion exercises These exercises warm up your muscles and joints and improve the movement and flexibility of your back. These exercises also help to relieve pain, numbness, and tingling. Lumbar rotation  Lie on your back on a firm bed or the floor with your knees bent. Straighten your arms out to your sides so each arm forms a 90-degree angle (right angle) with a side of your body. Slowly move (rotate) both of your knees to one side of your body  until you feel a stretch in your lower back (lumbar). Try not to let your shoulders lift off the floor. Hold this position for __________ seconds. Tense your abdominal muscles and slowly move your knees back to the starting position. Repeat this exercise on the other side of your body. Repeat __________ times. Complete this exercise __________ times a day. Single knee to chest  Lie on your back on a firm bed or the floor with both legs straight. Bend one of your knees. Use your hands to move your knee up toward your chest until you feel a gentle stretch in your lower back and buttock. Hold your leg in this position by holding on to the front of your knee. Keep your other leg as straight as possible. Hold this position for __________ seconds. Slowly return to the starting position. Repeat with your other leg. Repeat __________ times. Complete this exercise __________ times a day. Prone extension on elbows  Lie on your abdomen on a firm bed or the floor (prone position). Prop yourself up on your elbows. Use your arms to help lift your chest up until you feel a gentle stretch in your abdomen and your lower back. This will place some of your body weight on your elbows. If this is uncomfortable, try stacking pillows under your chest. Your hips should stay down, against the surface that you are lying on. Keep your hip and back muscles relaxed. Hold this position for __________ seconds. Slowly relax your upper body and  return to the starting position. Repeat __________ times. Complete this exercise __________ times a day. Strengthening exercises These exercises build strength and endurance in your back. Endurance is the ability to use your muscles for a long time, even after they get tired. Pelvic tilt This exercise strengthens the muscles that lie deep in the abdomen. Lie on your back on a firm bed or the floor with your legs extended. Bend your knees so they are pointing toward the ceiling and your feet are flat on the floor. Tighten your lower abdominal muscles to press your lower back against the floor. This motion will tilt your pelvis so your tailbone points up toward the ceiling instead of pointing to your feet or the floor. To help with this exercise, you may place a small towel under your lower back and try to push your back into the towel. Hold this position for __________ seconds. Let your muscles relax completely before you repeat this exercise. Repeat __________ times. Complete this exercise __________ times a day. Alternating arm and leg raises  Get on your hands and knees on a firm surface. If you are on a hard floor, you may want to use padding, such as an exercise mat, to cushion your knees. Line up your arms and legs. Your hands should be directly below your shoulders, and your knees should be directly below your hips. Lift your left leg behind you. At the same time, raise your right arm and straighten it in front of you. Do not lift your leg higher than your hip. Do not lift your arm higher than your shoulder. Keep your abdominal and back muscles tight. Keep your hips facing the ground. Do not arch your back. Keep your balance carefully, and do not hold your breath. Hold this position for __________ seconds. Slowly return to the starting position. Repeat with your right leg and your left arm. Repeat __________ times. Complete this exercise __________ times a day. Abdominal set with  straight leg raise  Lie on your back on a firm bed or the floor. Bend one of your knees and keep your other leg straight. Tense your abdominal muscles and lift your straight leg up, 4-6 inches (10-15 cm) off the ground. Keep your abdominal muscles tight and hold this position for __________ seconds. Do not hold your breath. Do not arch your back. Keep it flat against the ground. Keep your abdominal muscles tense as you slowly lower your leg back to the starting position. Repeat with your other leg. Repeat __________ times. Complete this exercise __________ times a day. Single leg lower with bent knees Lie on your back on a firm bed or the floor. Tense your abdominal muscles and lift your feet off the floor, one foot at a time, so your knees and hips are bent in 90-degree angles (right angles). Your knees should be over your hips and your lower legs should be parallel to the floor. Keeping your abdominal muscles tense and your knee bent, slowly lower one of your legs so your toe touches the ground. Lift your leg back up to return to the starting position. Do not hold your breath. Do not let your back arch. Keep your back flat against the ground. Repeat with your other leg. Repeat __________ times. Complete this exercise __________ times a day. Posture and body mechanics Good posture and healthy body mechanics can help to relieve stress in your body's tissues and joints. Body mechanics refers to the movements and positions of your body while you do your daily activities. Posture is part of body mechanics. Good posture means: Your spine is in its natural S-curve position (neutral). Your shoulders are pulled back slightly. Your head is not tipped forward (neutral). Follow these guidelines to improve your posture and body mechanics in your everyday activities. Standing  When standing, keep your spine neutral and your feet about hip-width apart. Keep a slight bend in your knees. Your ears,  shoulders, and hips should line up. When you do a task in which you stand in one place for a long time, place one foot up on a stable object that is 2-4 inches (5-10 cm) high, such as a footstool. This helps keep your spine neutral. Sitting  When sitting, keep your spine neutral and keep your feet flat on the floor. Use a footrest, if necessary, and keep your thighs parallel to the floor. Avoid rounding your shoulders, and avoid tilting your head forward. When working at a desk or a computer, keep your desk at a height where your hands are slightly lower than your elbows. Slide your chair under your desk so you are close enough to maintain good posture. When working at a computer, place your monitor at a height where you are looking straight ahead and you do not have to tilt your head forward or downward to look at the screen. Resting When lying down and resting, avoid positions that are most painful for you. If you have pain with activities such as sitting, bending, stooping, or squatting, lie in a position in which your body does not bend very much. For example, avoid curling up on your side with your arms and knees near your chest (fetal position). If you have pain with activities such as standing for a long time or reaching with your arms, lie with your spine in a neutral position and bend your knees slightly. Try the following positions: Lying on your side with a pillow between your knees. Lying on your back with a pillow  under your knees. Lifting  When lifting objects, keep your feet at least shoulder-width apart and tighten your abdominal muscles. Bend your knees and hips and keep your spine neutral. It is important to lift using the strength of your legs, not your back. Do not lock your knees straight out. Always ask for help to lift heavy or awkward objects. This information is not intended to replace advice given to you by your health care provider. Make sure you discuss any questions you  have with your health care provider. Document Revised: 05/02/2022 Document Reviewed: 03/16/2020 Elsevier Patient Education  2024 [employer]   Please get following labs to Dr. Monia office:  CBC with differential, CMP with GFR, UA, ANA, double-stranded DNA, C3-C4, sed rate

## 2023-09-20 NOTE — Addendum Note (Signed)
 Addended by: DOLPHUS REITER on: 09/20/2023 11:17 AM   Modules accepted: Level of Service

## 2023-10-12 ENCOUNTER — Other Ambulatory Visit: Payer: Self-pay | Admitting: Internal Medicine

## 2023-10-12 DIAGNOSIS — Z1231 Encounter for screening mammogram for malignant neoplasm of breast: Secondary | ICD-10-CM

## 2023-11-01 ENCOUNTER — Other Ambulatory Visit (HOSPITAL_BASED_OUTPATIENT_CLINIC_OR_DEPARTMENT_OTHER): Payer: Self-pay

## 2023-11-02 ENCOUNTER — Other Ambulatory Visit (HOSPITAL_BASED_OUTPATIENT_CLINIC_OR_DEPARTMENT_OTHER): Payer: Self-pay

## 2023-11-02 MED ORDER — TRIMETHOPRIM 100 MG PO TABS: 100.0000 mg | ORAL_TABLET | Freq: Every day | ORAL | 3 refills | Status: DC

## 2023-11-27 ENCOUNTER — Ambulatory Visit
Admission: RE | Admit: 2023-11-27 | Discharge: 2023-11-27 | Disposition: A | Discharge status: Home / Self Care | Source: Ambulatory Visit | Attending: Internal Medicine | Admitting: Internal Medicine

## 2023-11-27 DIAGNOSIS — Z1231 Encounter for screening mammogram for malignant neoplasm of breast: Secondary | ICD-10-CM

## 2023-12-28 ENCOUNTER — Other Ambulatory Visit (HOSPITAL_BASED_OUTPATIENT_CLINIC_OR_DEPARTMENT_OTHER): Payer: Self-pay

## 2023-12-28 MED ORDER — TRIMETHOPRIM 100 MG PO TABS
100.0000 mg | ORAL_TABLET | Freq: Every day | ORAL | 3 refills | Status: AC
  Filled 2023-12-28: qty 30, 30d supply, fill #0
  Filled 2024-01-04: qty 90, 90d supply, fill #0

## 2024-01-05 ENCOUNTER — Other Ambulatory Visit (HOSPITAL_BASED_OUTPATIENT_CLINIC_OR_DEPARTMENT_OTHER): Payer: Self-pay

## 2024-01-08 ENCOUNTER — Other Ambulatory Visit (HOSPITAL_BASED_OUTPATIENT_CLINIC_OR_DEPARTMENT_OTHER): Payer: Self-pay

## 2024-01-10 NOTE — Progress Notes (Addendum)
 Site of osteoarthritis:  Right hip  How long have you had pain? Couple months  What over the counter or prescription medications have you tried? No   Does anything make the pain better or worse? When laying on the affected side (right hip)can feel pain.   Ambulatory status? Walker? Wheelchair?:  Independent  SAFETY ISSUES: Prior radiation? 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 06/26/2023 with Dr. Donnice Barge.   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 Jason.  Pacemaker/ICD? No Possible current pregnancy?  Postmenopausal Is the patient on methotrexate? No  Current Complaints / other details: None   30 minutes spent total, including time for meaningful use questions, reviewing medication, as well as spent in face-to-face time in nurse evaluation with the patient.

## 2024-01-15 ENCOUNTER — Encounter: Payer: Self-pay | Admitting: Radiation Oncology

## 2024-01-15 ENCOUNTER — Ambulatory Visit
Admission: RE | Admit: 2024-01-15 | Discharge: 2024-01-15 | Disposition: A | Discharge status: Home / Self Care | Source: Ambulatory Visit | Attending: Radiation Oncology | Admitting: Radiation Oncology

## 2024-01-15 ENCOUNTER — Ambulatory Visit: Admitting: Radiation Oncology

## 2024-01-15 VITALS — BP 153/81 | HR 114 | Temp 97.6°F | Resp 20 | Ht 63.5 in | Wt 169.8 lb

## 2024-01-15 DIAGNOSIS — Z803 Family history of malignant neoplasm of breast: Secondary | ICD-10-CM | POA: Insufficient documentation

## 2024-01-15 DIAGNOSIS — Z510 Encounter for antineoplastic radiation therapy: Secondary | ICD-10-CM | POA: Diagnosis not present

## 2024-01-15 DIAGNOSIS — Z806 Family history of leukemia: Secondary | ICD-10-CM | POA: Diagnosis not present

## 2024-01-15 DIAGNOSIS — M170 Bilateral primary osteoarthritis of knee: Secondary | ICD-10-CM | POA: Insufficient documentation

## 2024-01-15 DIAGNOSIS — Z86000 Personal history of in-situ neoplasm of breast: Secondary | ICD-10-CM | POA: Diagnosis not present

## 2024-01-15 DIAGNOSIS — Z8052 Family history of malignant neoplasm of bladder: Secondary | ICD-10-CM | POA: Insufficient documentation

## 2024-01-15 DIAGNOSIS — Z800 Family history of malignant neoplasm of digestive organs: Secondary | ICD-10-CM | POA: Diagnosis not present

## 2024-01-15 DIAGNOSIS — Z923 Personal history of irradiation: Secondary | ICD-10-CM | POA: Diagnosis not present

## 2024-01-15 DIAGNOSIS — E785 Hyperlipidemia, unspecified: Secondary | ICD-10-CM | POA: Diagnosis not present

## 2024-01-15 DIAGNOSIS — I10 Essential (primary) hypertension: Secondary | ICD-10-CM | POA: Diagnosis not present

## 2024-01-15 DIAGNOSIS — Z79899 Other long term (current) drug therapy: Secondary | ICD-10-CM | POA: Insufficient documentation

## 2024-01-15 DIAGNOSIS — M79661 Pain in right lower leg: Secondary | ICD-10-CM | POA: Insufficient documentation

## 2024-01-15 NOTE — Progress Notes (Signed)
 " Radiation Oncology         [phone] ________________________________  Outpatient Consultation- CT SIM SAME DAY  Name: Yolanda Peters MRN: [id number]  Date of Service: 01/15/2024 DOB: 1940-03-06  RR:Mlddn, Norleen, MD  Onita Norleen, MD   REFERRING PHYSICIAN: Onita Norleen, MD  DIAGNOSIS: 84 yo woman with recalcitrant osteoarthritis of the knees and pain in both anterior lower legs/shins    ICD-10-CM   1. Osteoarthritis of lower legs, bilateral  M17.0     2. Primary osteoarthritis of both knees  M17.0     3. Pain in both lower legs  M79.661    M79.662       HISTORY OF PRESENT ILLNESS: Yolanda Peters is an active and pleasant 84 year old woman with a complex musculoskeletal history, including bilateral knee replacements and longstanding osteoarthritis, particularly involving both hips and hands. 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 There are no clinical signs of autoimmune flare, and her most recent rheumatology follow-up confirmed stable status.  She presents today, self-referred, having had significant relief with low dose radiation therapy (LDRT) for osteoarthritis in her right hip. She remains functionally independent but reports difficulty ambulating quickly, especially when navigating uneven terrain. During recent travel to [place], she required a cane for support due to hip and knee discomfort. While her hip pain has since improved, she continues to experience limitations in mobility, particularly with regard to speed and endurance.  Conservative treatments including physical therapy and joint replacement have provided partial benefit, but residual knee-related functional limitations persist. Her primary care physician, Dr. Onita, is aware of and supportive of proceeding with a trial of LDRT for symptomatic osteoarthritis involving the knees and lower legs.  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, skin cnacer   Cancer (HCC)    right breast    Hyperlipidemia    no meds taken   Hypertension    Personal history of radiation therapy 2011   rt breast      PAST SURGICAL HISTORY: Past Surgical History:  Procedure Laterality Date   BREAST DUCTAL SYSTEM EXCISION     right breast   BREAST LUMPECTOMY     CATARACT EXTRACTION     bilateral   CESAREAN SECTION     COLONOSCOPY     ELBOW SURGERY     left   I & D EXTREMITY Right 08/06/2020   Procedure: IRRIGATION AND DEBRIDEMENT OF LEG, wound vac placement;  Surgeon: Harden Jerona GAILS, MD;  Location: [facility] OR;  Service: Orthopedics;  Laterality: Right;   I & D EXTREMITY Right 08/11/2020   Procedure: IRRIGATION AND DEBRIDEMENT EXTREMITY SKIN GRAFT;  Surgeon: Harden Jerona GAILS, MD;  Location: [facility] OR;  Service: Orthopedics;  Laterality: Right;   JOINT REPLACEMENT     KNEE ARTHROPLASTY Left    March 2021   MYOMECTOMY     REPLACEMENT TOTAL KNEE Left 03/2019   TOTAL KNEE ARTHROPLASTY Right 06/26/2018   Procedure: TOTAL KNEE ARTHROPLASTY;  Surgeon: Ernie Cough, MD;  Location: [facility] ORS;  Service: Orthopedics;  Laterality: Right;  70 mins    FAMILY HISTORY:  Family History  Problem Relation Age of Onset   Cancer Mother        bladder   Cancer Father        leukemia   Breast cancer Maternal Aunt 71 - 49   Breast cancer Maternal Aunt 33 - 59   Breast cancer Maternal Aunt 22 - 69   Colon cancer Paternal Aunt        pt is unsure of this   Colon cancer Cousin        pt is unsure of this   Esophageal cancer Neg Hx    Rectal cancer  Neg Hx    Stomach cancer Neg Hx     SOCIAL HISTORY:  Social History   Socioeconomic History   Marital status: Married    Spouse name: Not on file   Number of children: Not on file   Years of education: Not on file   Highest education level: Not on file  Occupational History   Not on file  Tobacco Use   Smoking status: Never    Passive exposure: Never   Smokeless tobacco: Never  Vaping Use   Vaping status: Never Used  Substance and Sexual Activity   Alcohol use: Yes    Alcohol/week: 2.0 standard drinks of alcohol    Types: 2 Glasses of wine per week    Comment: occ   Drug use: No   Sexual activity: Yes  Other Topics Concern   Not on file  Social History Narrative   ** Merged History Encounter **       Social Drivers of Health   Tobacco Use: Low Risk (01/15/2024)   Patient History    Smoking Tobacco Use: Never    Smokeless Tobacco Use: Never    Passive Exposure: Never  Financial Resource Strain: Not on file  Food Insecurity: No Food Insecurity (01/15/2024)   Epic    Worried About Programme Researcher, Broadcasting/film/video in the Last Year: Never true    Ran Out of Food in the Last Year: Never true  Transportation Needs: No Transportation Needs (01/15/2024)   Epic    Lack of Transportation (Medical): No    Lack of Transportation (Non-Medical): No  Physical Activity: Not on file  Stress: Not on file  Social Connections: Unknown (05/25/2021)   Received from [facility]   Social Network    Social Network: Not on file  Intimate Partner Violence: Not At Risk (01/15/2024)   Epic    Fear of Current or Ex-Partner: No    Emotionally Abused: No    Physically Abused: No    Sexually Abused: No  Depression (PHQ2-9): Low Risk (01/15/2024)   Depression (PHQ2-9)    PHQ-2 Score: 0  Alcohol Screen: Low Risk (01/15/2024)   Alcohol Screen    Last Alcohol Screening Score (AUDIT): 0  Housing: Low Risk (01/15/2024)   Epic    Unable to Pay for Housing in the Last Year: No    Number of Times Moved in the Last  Year: 0    Homeless in the Last Year: No  Utilities: Not At Risk (01/15/2024)   Epic    Threatened with loss of utilities: No  Health Literacy: Not on file    ALLERGIES: Ace inhibitors, Penicillins, and Tetracyclines & related  MEDICATIONS:  Current Outpatient Medications  Medication Sig Dispense Refill   ALFALFA PO  Take by mouth.     Ascorbic Acid  (VITAMIN C) 1000 MG tablet Take 1,000 mg by mouth daily.     CALCIUM & MAGNESIUM  CARBONATES PO Take 2 tablets by mouth daily.     Cholecalciferol  (VITAMIN D3) 50 MCG (2000 UT) TABS Take 2,000 Units by mouth daily.     COVID-19 mRNA bivalent vaccine, Pfizer, (PFIZER COVID-19 VAC BIVALENT) injection Inject into the muscle. (Patient not taking: Reported on 09/20/2023) 0.3 mL 0   fluticasone (FLONASE) 50 MCG/ACT nasal spray Place 1 spray into both nostrils 2 (two) times daily as needed for allergies or rhinitis.     LECITHIN PO Take 400 mg by mouth daily.     loratadine  (CLARITIN ) 10 MG tablet Take 10 mg by mouth daily.     Potassium Aspartate POWD by Does not apply route.     Probiotic Product (PROBIOTIC DAILY PO) Take 1 capsule by mouth daily.      trimethoprim  (TRIMPEX ) 100 MG tablet Take 1 tablet (100 mg total) by mouth daily. 90 tablet 3   verapamil  (VERELAN  PM) 360 MG 24 hr capsule Take 360 mg by mouth daily.      vitamin E  180 MG (400 UNITS) capsule Take 400 Units by mouth daily.     zinc gluconate 50 MG tablet Take 50 mg by mouth daily.     No current facility-administered medications for this encounter.    REVIEW OF SYSTEMS:  On review of systems, the patient reports that she is doing well overall. She denies any chest pain, shortness of breath, cough, fevers, chills, night sweats, or recent unintended weight changes. She denies any bowel or bladder disturbances, and denies abdominal pain, nausea or vomiting. She has some chronic bilateral knee pain extending into the anterior lower legs that is exacerbated with activities and improves  with rest. She does occasionally wake from sleep with pain. She denies any new musculoskeletal or joint aches or pains and has not had any recent trauma or injury to her knowledge. A complete review of systems is obtained and is otherwise negative.    PHYSICAL EXAM:  Wt Readings from Last 3 Encounters:  01/15/24 169 lb 12.8 oz (77 kg)  09/20/23 163 lb 3.2 oz (74 kg)  06/19/23 163 lb 3.2 oz (74 kg)   Temp Readings from Last 3 Encounters:  01/15/24 97.6 F (36.4 C)  06/19/23 (!) 97.5 F (36.4 C)  05/15/23 98.7 F (37.1 C)   BP Readings from Last 3 Encounters:  01/15/24 (!) 153/81  09/20/23 122/79  06/19/23 (!) 157/84   Pulse Readings from Last 3 Encounters:  01/15/24 (!) 114  09/20/23 90  06/19/23 94   Pain Assessment Pain Score: 0-No pain/10  In general this is a well appearing Caucasian woman in no acute distress. She's alert and oriented x4 and appropriate throughout the examination. Cardiopulmonary assessment is negative for acute distress and she exhibits normal effort.     KPS = 100  100 - Normal; no complaints; no evidence of disease. 90   - Able to carry on normal activity; minor signs or symptoms of disease. 80   - Normal activity with effort; some signs or symptoms of disease. 22   - Cares for self; unable to carry on normal activity or to do active work. 60   - Requires occasional assistance, but is able to care for most of his personal needs. 50   - Requires considerable assistance and frequent medical care. 40   - Disabled; requires  special care and assistance. 30   - Severely disabled; hospital admission is indicated although death not imminent. 20   - Very sick; hospital admission necessary; active supportive treatment necessary. 10   - Moribund; fatal processes progressing rapidly. 0     - Dead  Karnofsky DA, Abelmann WH, Craver LS and Burchenal [facility] [address] [phone]) The use of the nitrogen mustards in the palliative treatment of carcinoma: with particular reference  to bronchogenic carcinoma Cancer 1 634-56  LABORATORY DATA:  Lab Results  Component Value Date   WBC 10.0 05/15/2023   HGB 15.2 (H) 05/15/2023   HCT 49.0 (H) 05/15/2023   MCV 89.9 05/15/2023   PLT 235 05/15/2023   Lab Results  Component Value Date   NA 136 05/15/2023   K 3.0 (L) 05/15/2023   CL 103 05/15/2023   CO2 19 (L) 05/15/2023   Lab Results  Component Value Date   ALT 24 05/15/2023   AST 22 05/15/2023   ALKPHOS 68 05/15/2023   BILITOT 0.9 05/15/2023     RADIOGRAPHY: No results found.    IMPRESSION/PLAN: 1. 84 y.o. woman with recalcitrant osteoarthritis of the knees and pain in both anterior lower legs/shins Chronic bilateral knee and lower leg pain due to osteoarthritis, recalcitrant to standard therapies, with functional limitation Remote history of autoimmune disease (inactive) without current immunosuppression Candidate for LDRT based on symptoms, performance status, and multidisciplinary support No contraindications to low dose radiation identified at this time  After a detailed discussion of the patients history, prior treatment response, and current goals, we reviewed the proposed protocol for LDRT targeting the bilateral knees/anterior tibia/fibula. Our institutional approach follows a regimen of 3 Gy total, delivered in 6 fractions of 0.5 Gy on a Monday-Wednesday-Friday schedule over two weeks. This dosing is consistent with published guidelines and peer-reviewed data for non-malignant musculoskeletal conditions.  We reviewed:  Goals of care: symptom relief and improved mobility Expected timeline for therapeutic benefit: typically several weeks to months Potential risks, including rare but theoretical concerns regarding late tissue effects or secondary malignancy (risk estimated to be extremely low at this dose and age) Lack of immunosuppression and inactive autoimmune history, minimizing concern for immune-mediated complications  My goal with low dose  radiation therapy is to help reduce chronic joint pain and improve her mobility, particularly for activities like walking or standing for longer periods. This treatment does not reverse joint damage, but it can calm the inflammation in and around the joints that contributes to pain. Based on published data, about 60-80% of patients with osteoarthritis experience meaningful pain relief within a few weeks to months after completing a short course of low-dose radiation. While not everyone responds, the majority of patients do report improvement, and the treatment is generally well tolerated.  Yolanda Peters was agreeable to proceed. We will arrange CT simulation for planning and initiate treatment pending final review and setup. A follow-up visit will be scheduled approximately 8-12 weeks after completion of LDRT to assess clinical response.  We personally spent 45 minutes in this encounter including chart review, reviewing radiological studies, meeting face-to-face with the patient, entering orders and completing documentation.    Sabra MICAEL Rusk, PA-C    Donnice Barge, MD  [facility]  Radiation Oncology Direct Dial: [phone]  Fax: [phone] [email or web address]  Skype  LinkedIn   Reference: Molinda HERO, et al. Randomized, double-blind, placebo-controlled trial on the effect of radiotherapy for painful heel spur (plantar fasciitis). Int JINNY Shock Oncol Biol Phys. 2012;84(5):e667-e672. https://lucas.com/    "

## 2024-01-17 DIAGNOSIS — M170 Bilateral primary osteoarthritis of knee: Secondary | ICD-10-CM | POA: Diagnosis not present

## 2024-01-18 NOTE — Progress Notes (Signed)
" °  Radiation Oncology         [phone] ________________________________  Name: Yolanda Peters MRN: [id number]  Date: 01/15/2024  DOB: [birth year]  SIMULATION AND TREATMENT PLANNING NOTE    ICD-10-CM   1. Primary osteoarthritis of both knees  M17.0       DIAGNOSIS:  84 yo woman with recalcitrant osteoarthritis of the knees and pain in both anterior lower legs/shins  NARRATIVE:  The patient was brought to the CT Simulation planning suite.  Identity was confirmed.  All relevant records and images related to the planned course of therapy were reviewed.  The patient freely provided informed written consent to proceed with treatment after reviewing the details related to the planned course of therapy. The consent form was witnessed and verified by the simulation staff.  Then, the patient was set-up in a stable reproducible  supine position for radiation therapy.  CT images were obtained.  Surface markings were placed.  The CT images were loaded into the planning software.  Then the target and avoidance structures were contoured.  Treatment planning then occurred.  The radiation prescription was entered and confirmed.  Then, I designed and supervised the construction of a total of at least 3 medically necessary complex treatment devices including vac-lock leg mold and two MLCs to shape radiation and shield critical organs.  I have requested : Isodose Plan.   PLAN:  The patient will receive 3 Gy in 6 fractions, M-W-F.  ________________________________  Donnice LABOR. Patrcia, M.D.  "

## 2024-01-22 ENCOUNTER — Other Ambulatory Visit: Payer: Self-pay

## 2024-01-22 ENCOUNTER — Ambulatory Visit
Admission: RE | Admit: 2024-01-22 | Discharge: 2024-01-22 | Disposition: A | Source: Ambulatory Visit | Attending: Radiation Oncology

## 2024-01-22 DIAGNOSIS — M170 Bilateral primary osteoarthritis of knee: Secondary | ICD-10-CM | POA: Diagnosis not present

## 2024-01-22 LAB — RAD ONC ARIA SESSION SUMMARY
Course Elapsed Days: 0
Plan Fractions Treated to Date: 1
Plan Prescribed Dose Per Fraction: 0.5 Gy
Plan Total Fractions Prescribed: 6
Plan Total Prescribed Dose: 3 Gy
Reference Point Dosage Given to Date: 0.5 Gy
Reference Point Session Dosage Given: 0.5 Gy
Session Number: 1

## 2024-01-23 ENCOUNTER — Ambulatory Visit

## 2024-01-24 ENCOUNTER — Ambulatory Visit
Admission: RE | Admit: 2024-01-24 | Discharge: 2024-01-24 | Disposition: A | Discharge status: Home / Self Care | Source: Ambulatory Visit | Attending: Radiation Oncology | Admitting: Radiation Oncology

## 2024-01-24 ENCOUNTER — Other Ambulatory Visit: Payer: Self-pay

## 2024-01-24 DIAGNOSIS — M170 Bilateral primary osteoarthritis of knee: Secondary | ICD-10-CM | POA: Diagnosis not present

## 2024-01-24 LAB — RAD ONC ARIA SESSION SUMMARY
Course Elapsed Days: 2
Plan Fractions Treated to Date: 2
Plan Prescribed Dose Per Fraction: 0.5 Gy
Plan Total Fractions Prescribed: 6
Plan Total Prescribed Dose: 3 Gy
Reference Point Dosage Given to Date: 1 Gy
Reference Point Session Dosage Given: 0.5 Gy
Session Number: 2

## 2024-01-25 ENCOUNTER — Ambulatory Visit

## 2024-01-26 ENCOUNTER — Ambulatory Visit
Admission: RE | Admit: 2024-01-26 | Discharge: 2024-01-26 | Disposition: A | Discharge status: Home / Self Care | Source: Ambulatory Visit | Attending: Radiation Oncology | Admitting: Radiation Oncology

## 2024-01-26 ENCOUNTER — Other Ambulatory Visit: Payer: Self-pay

## 2024-01-26 DIAGNOSIS — M170 Bilateral primary osteoarthritis of knee: Secondary | ICD-10-CM | POA: Diagnosis not present

## 2024-01-26 LAB — RAD ONC ARIA SESSION SUMMARY
Course Elapsed Days: 4
Plan Fractions Treated to Date: 3
Plan Prescribed Dose Per Fraction: 0.5 Gy
Plan Total Fractions Prescribed: 6
Plan Total Prescribed Dose: 3 Gy
Reference Point Dosage Given to Date: 1.5 Gy
Reference Point Session Dosage Given: 0.5 Gy
Session Number: 3

## 2024-01-29 ENCOUNTER — Other Ambulatory Visit: Payer: Self-pay

## 2024-01-29 ENCOUNTER — Ambulatory Visit
Admission: RE | Admit: 2024-01-29 | Discharge: 2024-01-29 | Disposition: A | Source: Ambulatory Visit | Attending: Radiation Oncology

## 2024-01-29 DIAGNOSIS — M170 Bilateral primary osteoarthritis of knee: Secondary | ICD-10-CM | POA: Diagnosis not present

## 2024-01-29 LAB — RAD ONC ARIA SESSION SUMMARY
Course Elapsed Days: 7
Plan Fractions Treated to Date: 4
Plan Prescribed Dose Per Fraction: 0.5 Gy
Plan Total Fractions Prescribed: 6
Plan Total Prescribed Dose: 3 Gy
Reference Point Dosage Given to Date: 2 Gy
Reference Point Session Dosage Given: 0.5 Gy
Session Number: 4

## 2024-01-31 ENCOUNTER — Ambulatory Visit
Admission: RE | Admit: 2024-01-31 | Discharge: 2024-01-31 | Disposition: A | Source: Ambulatory Visit | Attending: Radiation Oncology

## 2024-01-31 ENCOUNTER — Other Ambulatory Visit: Payer: Self-pay

## 2024-01-31 DIAGNOSIS — M170 Bilateral primary osteoarthritis of knee: Secondary | ICD-10-CM | POA: Diagnosis not present

## 2024-01-31 LAB — RAD ONC ARIA SESSION SUMMARY
Course Elapsed Days: 9
Plan Fractions Treated to Date: 5
Plan Prescribed Dose Per Fraction: 0.5 Gy
Plan Total Fractions Prescribed: 6
Plan Total Prescribed Dose: 3 Gy
Reference Point Dosage Given to Date: 2.5 Gy
Reference Point Session Dosage Given: 0.5 Gy
Session Number: 5

## 2024-02-02 ENCOUNTER — Other Ambulatory Visit: Payer: Self-pay

## 2024-02-02 ENCOUNTER — Ambulatory Visit
Admission: RE | Admit: 2024-02-02 | Discharge: 2024-02-02 | Disposition: A | Source: Ambulatory Visit | Attending: Radiation Oncology

## 2024-02-02 DIAGNOSIS — M170 Bilateral primary osteoarthritis of knee: Secondary | ICD-10-CM | POA: Diagnosis not present

## 2024-02-02 LAB — RAD ONC ARIA SESSION SUMMARY
Course Elapsed Days: 11
Plan Fractions Treated to Date: 6
Plan Prescribed Dose Per Fraction: 0.5 Gy
Plan Total Fractions Prescribed: 6
Plan Total Prescribed Dose: 3 Gy
Reference Point Dosage Given to Date: 3 Gy
Reference Point Session Dosage Given: 0.5 Gy
Session Number: 6

## 2024-02-05 NOTE — Radiation Completion Notes (Signed)
 Patient Name: LEXIE, KOEHL MRN: [id number] Date of Birth: [birth year] Referring Physician: NORLEEN JUNGLING, M.D. Date of Service: 2024-02-05 Radiation Oncologist: Donnice Barge, M.D. 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), N=N0, M=cM0 Intent: Palliative     ==========DELIVERED PLANS==========  First Treatment Date: 2024-01-22 Last Treatment Date: 2024-02-02   Plan Name: Ext_R_L Site: Tibia, Right Technique: Isodose Plan Mode: Photon Dose Per Fraction: 0.5 Gy Prescribed Dose (Delivered / Prescribed): 3 Gy / 3 Gy Prescribed Fxs (Delivered / Prescribed): 6 / 6     ==========ON TREATMENT VISIT DATES========== 2024-01-31     ==========UPCOMING VISITS========== 03/05/2024 [facility]-RADIATION ONC POST TREATMENT CALL [facility]-POST TREATMENT        ==========APPENDIX - ON TREATMENT VISIT NOTES==========   See weekly On Treatment Notes in Epic for details in the Media tab (listed as Progress notes on the On Treatment Visit Dates listed above).

## 2024-03-05 ENCOUNTER — Ambulatory Visit

## 2024-09-18 ENCOUNTER — Ambulatory Visit: Admitting: Rheumatology

## 2024-09-19 ENCOUNTER — Ambulatory Visit: Admitting: Rheumatology
